# Patient Record
Sex: Male | Born: 1950
Health system: Southern US, Community
[De-identification: ages and names within clinical notes are randomized; demographics above are authoritative.]

## PROBLEM LIST (undated history)

## (undated) DIAGNOSIS — M1990 Unspecified osteoarthritis, unspecified site: Secondary | ICD-10-CM

## (undated) DIAGNOSIS — I10 Essential (primary) hypertension: Secondary | ICD-10-CM

## (undated) DIAGNOSIS — C801 Malignant (primary) neoplasm, unspecified: Secondary | ICD-10-CM

## (undated) DIAGNOSIS — Z9009 Acquired absence of other part of head and neck: Secondary | ICD-10-CM

## (undated) DIAGNOSIS — E785 Hyperlipidemia, unspecified: Secondary | ICD-10-CM

## (undated) DIAGNOSIS — G4730 Sleep apnea, unspecified: Secondary | ICD-10-CM

## (undated) DIAGNOSIS — E079 Disorder of thyroid, unspecified: Secondary | ICD-10-CM

## (undated) DIAGNOSIS — E890 Postprocedural hypothyroidism: Secondary | ICD-10-CM

## (undated) DIAGNOSIS — Z923 Personal history of irradiation: Secondary | ICD-10-CM

## (undated) DIAGNOSIS — E039 Hypothyroidism, unspecified: Secondary | ICD-10-CM

## (undated) HISTORY — DX: Hyperlipidemia, unspecified: E78.5

## (undated) HISTORY — DX: Disorder of thyroid, unspecified: E07.9

## (undated) HISTORY — PX: THYROIDECTOMY, PARTIAL: SHX18

---

## 2000-05-07 ENCOUNTER — Emergency Department (HOSPITAL_COMMUNITY): Admission: EM | Admit: 2000-05-07 | Discharge: 2000-05-07 | Payer: Self-pay | Admitting: Emergency Medicine

## 2000-05-07 ENCOUNTER — Encounter: Payer: Self-pay | Admitting: Emergency Medicine

## 2002-03-13 ENCOUNTER — Emergency Department (HOSPITAL_COMMUNITY): Admission: EM | Admit: 2002-03-13 | Discharge: 2002-03-13 | Payer: Self-pay | Admitting: Emergency Medicine

## 2004-09-24 ENCOUNTER — Ambulatory Visit (HOSPITAL_COMMUNITY): Admission: RE | Admit: 2004-09-24 | Discharge: 2004-09-24 | Payer: Self-pay

## 2005-05-25 ENCOUNTER — Encounter: Admission: RE | Admit: 2005-05-25 | Discharge: 2005-05-25 | Payer: Self-pay

## 2011-12-03 ENCOUNTER — Other Ambulatory Visit: Payer: Self-pay | Admitting: Urology

## 2011-12-31 ENCOUNTER — Encounter (HOSPITAL_BASED_OUTPATIENT_CLINIC_OR_DEPARTMENT_OTHER): Payer: Self-pay | Admitting: *Deleted

## 2011-12-31 NOTE — Progress Notes (Signed)
NPO AFTER MN. ARRIVES AT 1015. NEEDS ISTAT AND EKG.

## 2012-01-04 ENCOUNTER — Encounter (HOSPITAL_BASED_OUTPATIENT_CLINIC_OR_DEPARTMENT_OTHER)
Admission: RE | Disposition: A | Discharge status: Home / Self Care | Payer: Self-pay | Source: Ambulatory Visit | Attending: Urology

## 2012-01-04 ENCOUNTER — Encounter (HOSPITAL_BASED_OUTPATIENT_CLINIC_OR_DEPARTMENT_OTHER): Payer: Self-pay | Admitting: Anesthesiology

## 2012-01-04 ENCOUNTER — Ambulatory Visit (HOSPITAL_BASED_OUTPATIENT_CLINIC_OR_DEPARTMENT_OTHER)
Admission: RE | Admit: 2012-01-04 | Discharge: 2012-01-04 | Disposition: A | Discharge status: Home / Self Care | Payer: Self-pay | Source: Ambulatory Visit | Attending: Urology | Admitting: Urology

## 2012-01-04 ENCOUNTER — Ambulatory Visit (HOSPITAL_BASED_OUTPATIENT_CLINIC_OR_DEPARTMENT_OTHER): Payer: Self-pay | Admitting: Anesthesiology

## 2012-01-04 ENCOUNTER — Encounter (HOSPITAL_BASED_OUTPATIENT_CLINIC_OR_DEPARTMENT_OTHER): Payer: Self-pay | Admitting: *Deleted

## 2012-01-04 DIAGNOSIS — N471 Phimosis: Secondary | ICD-10-CM | POA: Insufficient documentation

## 2012-01-04 DIAGNOSIS — E119 Type 2 diabetes mellitus without complications: Secondary | ICD-10-CM | POA: Insufficient documentation

## 2012-01-04 DIAGNOSIS — I10 Essential (primary) hypertension: Secondary | ICD-10-CM | POA: Insufficient documentation

## 2012-01-04 DIAGNOSIS — N478 Other disorders of prepuce: Secondary | ICD-10-CM | POA: Insufficient documentation

## 2012-01-04 DIAGNOSIS — Z79899 Other long term (current) drug therapy: Secondary | ICD-10-CM | POA: Insufficient documentation

## 2012-01-04 DIAGNOSIS — Z7982 Long term (current) use of aspirin: Secondary | ICD-10-CM | POA: Insufficient documentation

## 2012-01-04 HISTORY — DX: Unspecified osteoarthritis, unspecified site: M19.90

## 2012-01-04 HISTORY — PX: CIRCUMCISION: SHX1350

## 2012-01-04 HISTORY — DX: Acquired absence of other part of head and neck: Z90.09

## 2012-01-04 HISTORY — DX: Postprocedural hypothyroidism: E89.0

## 2012-01-04 HISTORY — DX: Essential (primary) hypertension: I10

## 2012-01-04 LAB — POCT I-STAT 4, (NA,K, GLUC, HGB,HCT)
Glucose, Bld: 114 mg/dL — ABNORMAL HIGH (ref 70–99)
HCT: 43 % (ref 39.0–52.0)

## 2012-01-04 SURGERY — CIRCUMCISION, ADULT
Anesthesia: General | Site: Penis | Wound class: Clean

## 2012-01-04 MED ORDER — MORPHINE SULFATE 2 MG/ML IJ SOLN: 1.0000 mg | INTRAMUSCULAR | Status: DC | PRN

## 2012-01-04 MED ORDER — ACETAMINOPHEN 10 MG/ML IV SOLN
1000.0000 mg | Freq: Four times a day (QID) | INTRAVENOUS | Status: DC
  Administered 2012-01-04: 1000 mg via INTRAVENOUS

## 2012-01-04 MED ORDER — CEFAZOLIN SODIUM-DEXTROSE 2-3 GM-% IV SOLR: 2.0000 g | INTRAVENOUS | Status: DC

## 2012-01-04 MED ORDER — GLYCOPYRROLATE 0.2 MG/ML IJ SOLN
INTRAMUSCULAR | Status: DC | PRN
  Administered 2012-01-04: 0.2 mg via INTRAVENOUS

## 2012-01-04 MED ORDER — BUPIVACAINE HCL 0.25 % IJ SOLN
INTRAMUSCULAR | Status: DC | PRN
  Administered 2012-01-04: 20 mL

## 2012-01-04 MED ORDER — ACETAMINOPHEN 325 MG PO TABS: 650.0000 mg | ORAL_TABLET | ORAL | Status: DC | PRN

## 2012-01-04 MED ORDER — MIDAZOLAM HCL 5 MG/5ML IJ SOLN
INTRAMUSCULAR | Status: DC | PRN
  Administered 2012-01-04: 2 mg via INTRAVENOUS

## 2012-01-04 MED ORDER — SODIUM CHLORIDE 0.9 % IV SOLN: 250.0000 mL | INTRAVENOUS | Status: DC | PRN

## 2012-01-04 MED ORDER — ONDANSETRON HCL 4 MG/2ML IJ SOLN: 4.0000 mg | Freq: Four times a day (QID) | INTRAMUSCULAR | Status: DC | PRN

## 2012-01-04 MED ORDER — ONDANSETRON HCL 4 MG/2ML IJ SOLN
INTRAMUSCULAR | Status: DC | PRN
  Administered 2012-01-04: 4 mg via INTRAVENOUS

## 2012-01-04 MED ORDER — LIDOCAINE HCL (CARDIAC) 20 MG/ML IV SOLN
INTRAVENOUS | Status: DC | PRN
  Administered 2012-01-04: 100 mg via INTRAVENOUS

## 2012-01-04 MED ORDER — PROPOFOL 10 MG/ML IV BOLUS
INTRAVENOUS | Status: DC | PRN
  Administered 2012-01-04: 300 mg via INTRAVENOUS

## 2012-01-04 MED ORDER — FENTANYL CITRATE 0.05 MG/ML IJ SOLN: 25.0000 ug | INTRAMUSCULAR | Status: DC | PRN

## 2012-01-04 MED ORDER — LACTATED RINGERS IV SOLN
INTRAVENOUS | Status: DC
  Administered 2012-01-04: 100 mL/h via INTRAVENOUS

## 2012-01-04 MED ORDER — LACTATED RINGERS IV SOLN
INTRAVENOUS | Status: DC | PRN
  Administered 2012-01-04 (×2): via INTRAVENOUS

## 2012-01-04 MED ORDER — OXYCODONE HCL 5 MG PO TABS: 5.0000 mg | ORAL_TABLET | ORAL | Status: DC | PRN

## 2012-01-04 MED ORDER — DEXTROSE 5 % IV SOLN
3.0000 g | INTRAVENOUS | Status: DC | PRN
  Administered 2012-01-04: 3 g via INTRAVENOUS

## 2012-01-04 MED ORDER — DEXAMETHASONE SODIUM PHOSPHATE 4 MG/ML IJ SOLN
INTRAMUSCULAR | Status: DC | PRN
  Administered 2012-01-04: 10 mg via INTRAVENOUS

## 2012-01-04 MED ORDER — EPHEDRINE SULFATE 50 MG/ML IJ SOLN
INTRAMUSCULAR | Status: DC | PRN
  Administered 2012-01-04: 10 mg via INTRAVENOUS

## 2012-01-04 MED ORDER — SODIUM CHLORIDE 0.9 % IJ SOLN: 3.0000 mL | INTRAMUSCULAR | Status: DC | PRN

## 2012-01-04 MED ORDER — SODIUM CHLORIDE 0.9 % IJ SOLN: 3.0000 mL | Freq: Two times a day (BID) | INTRAMUSCULAR | Status: DC

## 2012-01-04 MED ORDER — CEPHALEXIN 500 MG PO CAPS: 500.0000 mg | ORAL_CAPSULE | Freq: Two times a day (BID) | ORAL | Status: DC

## 2012-01-04 MED ORDER — DEXTROSE 5 % IV SOLN: 3.0000 g | INTRAVENOUS | Status: DC

## 2012-01-04 MED ORDER — FENTANYL CITRATE 0.05 MG/ML IJ SOLN
INTRAMUSCULAR | Status: DC | PRN
  Administered 2012-01-04: 100 ug via INTRAVENOUS
  Administered 2012-01-04 (×2): 50 ug via INTRAVENOUS

## 2012-01-04 MED ORDER — PROMETHAZINE HCL 25 MG/ML IJ SOLN: 6.2500 mg | INTRAMUSCULAR | Status: DC | PRN

## 2012-01-04 MED ORDER — KETOROLAC TROMETHAMINE 30 MG/ML IJ SOLN: 15.0000 mg | Freq: Once | INTRAMUSCULAR | Status: DC | PRN

## 2012-01-04 MED ORDER — KETOROLAC TROMETHAMINE 30 MG/ML IJ SOLN
INTRAMUSCULAR | Status: DC | PRN
  Administered 2012-01-04: 30 mg via INTRAVENOUS

## 2012-01-04 MED ORDER — ACETAMINOPHEN 650 MG RE SUPP: 650.0000 mg | RECTAL | Status: DC | PRN

## 2012-01-04 MED ORDER — HYDROCODONE-ACETAMINOPHEN 7.5-650 MG PO TABS: 1.0000 | ORAL_TABLET | Freq: Four times a day (QID) | ORAL | Status: DC | PRN

## 2012-01-04 MED ORDER — SODIUM CHLORIDE 0.45 % IV SOLN: INTRAVENOUS | Status: DC

## 2012-01-04 SURGICAL SUPPLY — 33 items
ADH SKN CLS APL DERMABOND .7 (GAUZE/BANDAGES/DRESSINGS) ×1
BANDAGE CO FLEX L/F 2IN X 5YD (GAUZE/BANDAGES/DRESSINGS) ×2 IMPLANT
BLADE SURG 15 STRL LF DISP TIS (BLADE) ×1 IMPLANT
BLADE SURG 15 STRL SS (BLADE) ×2
BLADE SURG ROTATE 9660 (MISCELLANEOUS) ×1 IMPLANT
CLOTH BEACON ORANGE TIMEOUT ST (SAFETY) ×2 IMPLANT
COVER MAYO STAND STRL (DRAPES) ×2 IMPLANT
COVER TABLE BACK 60X90 (DRAPES) ×2 IMPLANT
DERMABOND ADVANCED (GAUZE/BANDAGES/DRESSINGS) ×1
DERMABOND ADVANCED .7 DNX12 (GAUZE/BANDAGES/DRESSINGS) ×1 IMPLANT
DRAPE PED LAPAROTOMY (DRAPES) ×2 IMPLANT
ELECT NDL TIP 2.8 STRL (NEEDLE) ×1 IMPLANT
ELECT NEEDLE TIP 2.8 STRL (NEEDLE) ×2 IMPLANT
ELECT REM PT RETURN 9FT ADLT (ELECTROSURGICAL) ×2
ELECTRODE REM PT RTRN 9FT ADLT (ELECTROSURGICAL) ×1 IMPLANT
GLOVE BIO SURGEON STRL SZ7.5 (GLOVE) ×3 IMPLANT
GLOVE BIOGEL PI IND STRL 6.5 (GLOVE) IMPLANT
GLOVE BIOGEL PI IND STRL 7.0 (GLOVE) IMPLANT
GLOVE BIOGEL PI INDICATOR 6.5 (GLOVE) ×1
GLOVE BIOGEL PI INDICATOR 7.0 (GLOVE) ×1
GOWN PREVENTION PLUS LG XLONG (DISPOSABLE) ×2 IMPLANT
GOWN STRL REIN XL XLG (GOWN DISPOSABLE) ×2 IMPLANT
NDL HYPO 25X5/8 SAFETYGLIDE (NEEDLE) ×1 IMPLANT
NEEDLE HYPO 25X5/8 SAFETYGLIDE (NEEDLE) ×2 IMPLANT
NS IRRIG 500ML POUR BTL (IV SOLUTION) ×2 IMPLANT
PACK BASIN DAY SURGERY FS (CUSTOM PROCEDURE TRAY) ×2 IMPLANT
PENCIL BUTTON HOLSTER BLD 10FT (ELECTRODE) ×2 IMPLANT
SUT MON AB 4-0 PC3 18 (SUTURE) ×5 IMPLANT
SYR CONTROL 10ML LL (SYRINGE) ×2 IMPLANT
TOWEL NATURAL 6PK STERILE (DISPOSABLE) ×2 IMPLANT
TOWEL OR 17X24 6PK STRL BLUE (TOWEL DISPOSABLE) ×4 IMPLANT
TRAY DSU PREP LF (CUSTOM PROCEDURE TRAY) ×2 IMPLANT
WATER STERILE IRR 500ML POUR (IV SOLUTION) ×2 IMPLANT

## 2012-01-04 NOTE — Anesthesia Procedure Notes (Signed)
Procedure Name: LMA Insertion Date/Time: 01/04/2012 12:06 PM Performed by: Jessica Priest Pre-anesthesia Checklist: Patient identified, Emergency Drugs available, Suction available and Patient being monitored Patient Re-evaluated:Patient Re-evaluated prior to inductionOxygen Delivery Method: Circle System Utilized Preoxygenation: Pre-oxygenation with 100% oxygen Intubation Type: IV induction Ventilation: Mask ventilation without difficulty LMA: LMA inserted LMA Size: 5.0 Number of attempts: 1 Airway Equipment and Method: bite block Placement Confirmation: positive ETCO2 Tube secured with: Tape Dental Injury: Teeth and Oropharynx as per pre-operative assessment

## 2012-01-04 NOTE — Transfer of Care (Signed)
Immediate Anesthesia Transfer of Care Note  Patient: Isaac Carpenter  Procedure(s) Performed: Procedure(s) (LRB): CIRCUMCISION ADULT (N/A)  Patient Location: PACU  Anesthesia Type: General  Level of Consciousness: awake, sedated, patient cooperative and responds to stimulation  Airway & Oxygen Therapy: Patient Spontanous Breathing and Patient connected to face mask oxygen  Post-op Assessment: Report given to PACU RN, Post -op Vital signs reviewed and stable and Patient moving all extremities  Post vital signs: Reviewed and stable  Complications: No apparent anesthesia complications

## 2012-01-04 NOTE — H&P (Signed)
Chief Complaint  cc: Dr. Merri Brunette   Reason For Visit  Painful erections   Active Problems Problems  1. Diabetes Mellitus 250.00 2. Phimosis 605  History of Present Illness          61 yo male, 6'1" 295lb,  referred by Dr. Merri Brunette for further evaluation of painful erections x 5-6 months.  He is uncircumcised & states that the foreskin gets tight with erections causing pain.  He also has some urgency, frequency & nocturia x 1.  Today's IPSS = 4/7. He denies diabetes. Note hx of abnormal blood sugar per Dr.Smith's notes.   Past Medical History Problems  1. History of  Hypertension 401.9  Surgical History Problems  1. History of  Thyroid Surgery  Current Meds 1. Aspirin 81 MG Oral Tablet; Therapy: (Recorded:15Aug2013) to 2. Hydrochlorothiazide 25 MG Oral Tablet; Therapy: (Recorded:15Aug2013) to  Allergies Medication  1. AndroGel Pump GEL  Family History Problems  1. Maternal history of  Breast Cancer V16.3 2. Family history of  Brother Deceased At Age ____ liver disease 3. Fraternal history of  Chronic Liver Disease 4. Paternal history of  Emphysema 5. Family history of  Family Health Status Children ___ Living Sons 1 6. Family history of  Father Deceased At Age ____ emphysema, heart disease 7. Paternal history of  Heart Disease V17.49 8. Sororal history of  Leukemia V16.6 9. Family history of  Mother Deceased At Age ____ breast cancer 10. Family history of  Sister Deceased At Age ____ leukemia  Social History Problems    Caffeine Use 2   Former Smoker V15.82 smoked a couple of cigarettes back in high school, no more than that   Marital History - Single   Occupation: Engineer, maintenance (IT)    History of  Alcohol Use  Review of Systems Genitourinary, constitutional, skin, eye, otolaryngeal, hematologic/lymphatic, cardiovascular, pulmonary, endocrine, musculoskeletal, gastrointestinal, neurological and psychiatric system(s) were reviewed and  pertinent findings if present are noted.  Genitourinary: urinary frequency, feelings of urinary urgency and nocturia and painful erections.    Vitals Vital Signs [Data Includes: Last 1 Day]  15Aug2013 09:08AM  BMI Calculated: 39.1 BSA Calculated: 2.53 Height: 6 ft 1 in Weight: 295 lb  Blood Pressure: 166 / 82 Temperature: 97.6 F Heart Rate: 47  Physical Exam Abdomen: The abdomen is obese. The abdomen is soft and nontender. No masses are palpated. No CVA tenderness. No hernias are palpable. No hepatosplenomegaly noted.  Genitourinary: Examination of the penis demonstrates tenderness on palpation, an adherent prepuce and phimosis, but no paraphimosis, no balanitis, no priapism, no lesions and a normal meatus. The penis is uncircumcised. The scrotum is normal in appearance.    Assessment Assessed  1. Diabetes Mellitus 250.00 2. Phimosis 605   [name] is an obese 61 yo male wiht severe phimosis-typical of a diabetic ( which he denies). He will leave a u/a today to ck for glucose. Not per Dr. Michaelle Copas notes that he ha a  hx of abnormal glucose. Will need circumcision, so will ck glucose and A1c.   Plan Diabetes Mellitus (250.00)  1. HGB A1C  Done: 15Aug2013      Schedule circumcision.  Ck u/a today. Hg A1c today.   Signatures Electronically signed by : Jethro Bolus, M.D.; Dec 03 2011  4:06PM

## 2012-01-04 NOTE — Op Note (Signed)
Pre-operative diagnosis : Diabetic Phimosis  Postoperative diagnosis: Same  Operation: Circumcision  Surgeon:  S. Patsi Sears, MD  First assistant: None  Anesthesia:  general  Preparation: After appropriate preanesthesia, the patient was brought to the operating room, placed on the operating table in the dorsal is supine position, where the penis was prepped with Betadine solution, and draped in usual fashion. The armband was double checked. IV antibiotic was given. IV Tylenol was given.  Review history:History of Present Illness  60 yo male, 6'1" 295lb, referred by Dr. Merri Brunette for further evaluation of painful erections x 5-6 months. He is uncircumcised & states that the foreskin gets tight with erections causing pain. He also has some urgency, frequency & nocturia x 1. Today's IPSS = 4/7. He denies diabetes. Note hx of abnormal blood sugar per Dr.Smith's notes.      Statement of  Likelihood of Success: Excellent. TIME-OUT observed.:  Procedure: Inspection the penis revealed that the patient had extensive phimosis. The white discolored foreskin was thickened, and could not be delivered over the glans. The foreskin had cracked in multiple areas, and scarred on previous occasions. A blue marking pen was used to mark the pericarinal surface. A straight clamp was used to clamp the 12:00 skin, and incision was made. Marcaine 25 plain was then injected at the base the penis, circumferentially, to give a penile block. The foreskin was then brought proximalward, across the glans, and the penile skin cleansed of a large quantity of whitish dry mucous and debris. Following this, a blue marking pen was used to outline a periglandular incision. Pericarinal and periglandular incisions were made. The foreskin was removed and discarded. Hemostasis was achieved with the electrosurgical unit.  4 separate quadrants were then created with 4-0 Monocryl suture. Each quadrant was then closed with interrupted  4-0 Monocryl sutures. Following this, sterile dressing was created using Dermabond. The patient was awakened, taken to recovery room in good condition.

## 2012-01-04 NOTE — Anesthesia Preprocedure Evaluation (Signed)
Anesthesia Evaluation  Patient identified by MRN, date of birth, ID band Patient awake    Reviewed: Allergy & Precautions, H&P , NPO status , Patient's Chart, lab work & pertinent test results  Airway Mallampati: II TM Distance: <3 FB Neck ROM: Full    Dental No notable dental hx.    Pulmonary neg pulmonary ROS,    + decreased breath sounds      Cardiovascular hypertension, Pt. on medications Rhythm:Regular Rate:Normal     Neuro/Psych negative neurological ROS  negative psych ROS   GI/Hepatic negative GI ROS, Neg liver ROS,   Endo/Other  Morbid obesity  Renal/GU negative Renal ROS  negative genitourinary   Musculoskeletal negative musculoskeletal ROS (+)   Abdominal   Peds negative pediatric ROS (+)  Hematology negative hematology ROS (+)   Anesthesia Other Findings   Reproductive/Obstetrics negative OB ROS                           Anesthesia Physical Anesthesia Plan  ASA: III  Anesthesia Plan: General   Post-op Pain Management:    Induction: Intravenous  Airway Management Planned: LMA  Additional Equipment:   Intra-op Plan:   Post-operative Plan:   Informed Consent: I have reviewed the patients History and Physical, chart, labs and discussed the procedure including the risks, benefits and alternatives for the proposed anesthesia with the patient or authorized representative who has indicated his/her understanding and acceptance.   Dental advisory given  Plan Discussed with: CRNA and Surgeon  Anesthesia Plan Comments:         Anesthesia Quick Evaluation

## 2012-01-04 NOTE — Interval H&P Note (Signed)
History and Physical Interval Note:  01/04/2012 12:00 PM  Isaac Carpenter  has presented today for surgery, with the diagnosis of phimosis  The various methods of treatment have been discussed with the patient and family. After consideration of risks, benefits and other options for treatment, the patient has consented to  Procedure(s) (LRB) with comments: CIRCUMCISION ADULT (N/A) as a surgical intervention .  The patient's history has been reviewed, patient examined, no change in status, stable for surgery.  I have reviewed the patient's chart and labs.  Questions were answered to the patient's satisfaction.     Jethro Bolus I

## 2012-01-04 NOTE — Anesthesia Postprocedure Evaluation (Signed)
  Anesthesia Post-op Note  Patient: Isaac Carpenter  Procedure(s) Performed: Procedure(s) (LRB): CIRCUMCISION ADULT (N/A)  Patient Location: PACU  Anesthesia Type: General  Level of Consciousness: awake and alert   Airway and Oxygen Therapy: Patient Spontanous Breathing  Post-op Pain: mild  Post-op Assessment: Post-op Vital signs reviewed, Patient's Cardiovascular Status Stable, Respiratory Function Stable, Patent Airway and No signs of Nausea or vomiting  Post-op Vital Signs: stable  Complications: No apparent anesthesia complications

## 2012-01-05 ENCOUNTER — Encounter (HOSPITAL_BASED_OUTPATIENT_CLINIC_OR_DEPARTMENT_OTHER): Payer: Self-pay | Admitting: Urology

## 2012-03-04 ENCOUNTER — Emergency Department (HOSPITAL_COMMUNITY)
Admission: EM | Admit: 2012-03-04 | Discharge: 2012-03-04 | Disposition: A | Discharge status: Home / Self Care | Payer: Self-pay | Attending: Emergency Medicine | Admitting: Emergency Medicine

## 2012-03-04 ENCOUNTER — Encounter (HOSPITAL_COMMUNITY): Payer: Self-pay | Admitting: Emergency Medicine

## 2012-03-04 ENCOUNTER — Emergency Department (HOSPITAL_COMMUNITY): Payer: Self-pay

## 2012-03-04 DIAGNOSIS — G459 Transient cerebral ischemic attack, unspecified: Secondary | ICD-10-CM | POA: Insufficient documentation

## 2012-03-04 DIAGNOSIS — Z79899 Other long term (current) drug therapy: Secondary | ICD-10-CM | POA: Insufficient documentation

## 2012-03-04 DIAGNOSIS — M119 Crystal arthropathy, unspecified: Secondary | ICD-10-CM | POA: Insufficient documentation

## 2012-03-04 DIAGNOSIS — I10 Essential (primary) hypertension: Secondary | ICD-10-CM | POA: Insufficient documentation

## 2012-03-04 DIAGNOSIS — Z8639 Personal history of other endocrine, nutritional and metabolic disease: Secondary | ICD-10-CM | POA: Insufficient documentation

## 2012-03-04 DIAGNOSIS — Z862 Personal history of diseases of the blood and blood-forming organs and certain disorders involving the immune mechanism: Secondary | ICD-10-CM | POA: Insufficient documentation

## 2012-03-04 DIAGNOSIS — R410 Disorientation, unspecified: Secondary | ICD-10-CM

## 2012-03-04 DIAGNOSIS — I6350 Cerebral infarction due to unspecified occlusion or stenosis of unspecified cerebral artery: Secondary | ICD-10-CM | POA: Insufficient documentation

## 2012-03-04 DIAGNOSIS — F29 Unspecified psychosis not due to a substance or known physiological condition: Secondary | ICD-10-CM

## 2012-03-04 LAB — GLUCOSE, CAPILLARY: Glucose-Capillary: 134 mg/dL — ABNORMAL HIGH (ref 70–99)

## 2012-03-04 LAB — CBC
HCT: 42.4 % (ref 39.0–52.0)
Hemoglobin: 14.5 g/dL (ref 13.0–17.0)
MCH: 30.7 pg (ref 26.0–34.0)
MCHC: 34.2 g/dL (ref 30.0–36.0)
MCV: 89.8 fL (ref 78.0–100.0)
Platelets: 202 10*3/uL (ref 150–400)
RBC: 4.72 MIL/uL (ref 4.22–5.81)
RDW: 13.9 % (ref 11.5–15.5)
WBC: 6.1 K/uL (ref 4.0–10.5)

## 2012-03-04 LAB — DIFFERENTIAL
Basophils Absolute: 0 10*3/uL (ref 0.0–0.1)
Basophils Relative: 0 % (ref 0–1)
Eosinophils Absolute: 0.2 10*3/uL (ref 0.0–0.7)
Eosinophils Relative: 3 % (ref 0–5)
Lymphocytes Relative: 39 % (ref 12–46)
Lymphs Abs: 2.4 K/uL (ref 0.7–4.0)
Monocytes Absolute: 0.6 K/uL (ref 0.1–1.0)
Monocytes Relative: 10 % (ref 3–12)
Neutro Abs: 2.9 10*3/uL (ref 1.7–7.7)
Neutrophils Relative %: 48 % (ref 43–77)

## 2012-03-04 LAB — TROPONIN I: Troponin I: <0.3 ng/mL (ref <0.30)

## 2012-03-04 LAB — APTT: aPTT: 29 seconds (ref 24–37)

## 2012-03-04 LAB — POCT I-STAT, CHEM 8
BUN: 25 mg/dL — ABNORMAL HIGH (ref 6–23)
Calcium, Ion: 1.14 mmol/L (ref 1.13–1.30)
Chloride: 102 mEq/L (ref 96–112)
Creatinine, Ser: 1 mg/dL (ref 0.50–1.35)
Glucose, Bld: 122 mg/dL — ABNORMAL HIGH (ref 70–99)
HCT: 43 % (ref 39.0–52.0)
Hemoglobin: 14.6 g/dL (ref 13.0–17.0)
Potassium: 3.4 mEq/L — ABNORMAL LOW (ref 3.5–5.1)
Sodium: 141 mEq/L (ref 135–145)
TCO2: 28 mmol/L (ref 0–100)

## 2012-03-04 LAB — COMPREHENSIVE METABOLIC PANEL WITH GFR
BUN: 23 mg/dL (ref 6–23)
CO2: 28 meq/L (ref 19–32)
Calcium: 9.1 mg/dL (ref 8.4–10.5)
Creatinine, Ser: 0.87 mg/dL (ref 0.50–1.35)
GFR calc Af Amer: >90 mL/min (ref >90)
GFR calc non Af Amer: >90 mL/min (ref >90)
Glucose, Bld: 123 mg/dL — ABNORMAL HIGH (ref 70–99)

## 2012-03-04 LAB — COMPREHENSIVE METABOLIC PANEL
ALT: 32 U/L (ref 0–53)
AST: 34 U/L (ref 0–37)
Albumin: 3.8 g/dL (ref 3.5–5.2)
Alkaline Phosphatase: 46 U/L (ref 39–117)
Chloride: 101 mEq/L (ref 96–112)
Potassium: 3.6 mEq/L (ref 3.5–5.1)
Sodium: 140 mEq/L (ref 135–145)
Total Bilirubin: 0.4 mg/dL (ref 0.3–1.2)
Total Protein: 6.7 g/dL (ref 6.0–8.3)

## 2012-03-04 LAB — PROTIME-INR
INR: 1.01 (ref 0.00–1.49)
Prothrombin Time: 13.2 seconds (ref 11.6–15.2)

## 2012-03-04 NOTE — ED Notes (Signed)
Wife stated he had a sudden onset of confusion around 8:30 pm..  Wife gave wo baby aspirin around 9 pm.  Wife denies seeing facial droop/weakness/slurred speech.  Wife took BP at home (158/73) took again (123/73); pt. Unsure why he is here; oriented to person and place.

## 2012-03-04 NOTE — ED Provider Notes (Signed)
History     CSN: [id number]  Arrival date & time 03/04/12  2153   First MD Initiated Contact with Patient 03/04/12 2221      Chief Complaint  Patient presents with  . Code Stroke    (Consider location/radiation/quality/duration/timing/severity/associated sxs/prior treatment) HPI Comments: Pt and spouse had eaten dinner together, came home,m she was upstairs and he down, was talking to a friend.  Pt had altered mentation during telephone conversation, was speaking non-sensically per spouse.  Friend told patient to call for his wife which she did.  She found he wasn't speaking right, memory was poor.  Pt brought here, code stroke called from triage.  Pt denies HA, reports feeling fine.  Pt has amnesia to recent events, doesn't recall calling for wife, didn't recall going to eat dinner.  He recalls skipping lunch and has good memory of eating breakfast, confirmed by spouse.  No prior h/o TIA, CVA, CAD.  Pt has known HTN, takes meds.  No cholesterol, DM.  Brother had cardiac history, no immediate family with CVA's or TIA.    The history is provided by the patient and the spouse.    Past Medical History  Diagnosis Date  . Hypertension   . History of partial thyroidectomy STATES OVERACTIVE THYROID-- NO ISSUES SINCE AGE 61 AND NO MEDS  . Arthritis     Past Surgical History  Procedure Date  . Thyroidectomy, partial AGE 61    OVERACTIVE THYROID  . Circumcision 01/04/2012    Procedure: CIRCUMCISION ADULT;  Surgeon: Kathi Ludwig, MD;  Location: [facility];  Service: Urology;  Laterality: N/A;    History reviewed. No pertinent family history.  History  Substance Use Topics  . Smoking status: Never Smoker   . Smokeless tobacco: Current User    Types: Chew     Comment: CHEWED FOR 10 YRS  . Alcohol Use: Not on file      Review of Systems  Constitutional: Negative.   Cardiovascular: Negative for chest pain.  Gastrointestinal: Negative for nausea and  abdominal pain.  Neurological: Positive for speech difficulty. Negative for syncope, weakness and headaches.  All other systems reviewed and are negative.    Allergies  Review of patient's allergies indicates no known allergies.  Home Medications   Current Outpatient Rx  Name  Route  Sig  Dispense  Refill  . ASPIRIN EC 81 MG PO TBEC   Oral   Take 81 mg by mouth daily.         Marland Kitchen HYDROCHLOROTHIAZIDE 25 MG PO TABS   Oral   Take 25 mg by mouth daily.           BP 134/65  Pulse 57  Temp 98.4 F (36.9 C) (Oral)  Resp 18  SpO2 98%  Physical Exam  Nursing note and vitals reviewed. Constitutional: He is oriented to person, place, and time. He appears well-developed and well-nourished.  HENT:  Head: Normocephalic and atraumatic.  Eyes: EOM are normal. Pupils are equal, round, and reactive to light.  Neck: Normal range of motion. Neck supple.  Cardiovascular: Normal rate and regular rhythm.   No murmur heard. Pulmonary/Chest: Effort normal. No respiratory distress.  Abdominal: Soft. He exhibits no distension. There is no tenderness.  Musculoskeletal: Normal range of motion. He exhibits no edema.  Neurological: He is alert and oriented to person, place, and time. No cranial nerve deficit. Coordination normal.  Skin: Skin is warm and dry. No rash noted.  Psychiatric: He has a  normal mood and affect.    ED Course  Procedures (including critical care time)  Labs Reviewed  GLUCOSE, CAPILLARY - Abnormal; Notable for the following:    Glucose-Capillary 134 (*)     All other components within normal limits  POCT I-STAT, CHEM 8 - Abnormal; Notable for the following:    Potassium 3.4 (*)     BUN 25 (*)     Glucose, Bld 122 (*)     All other components within normal limits  PROTIME-INR  APTT  CBC  DIFFERENTIAL  COMPREHENSIVE METABOLIC PANEL  TROPONIN I  URINE RAPID DRUG SCREEN (HOSP PERFORMED)   Ct Head Wo Contrast  03/04/2012  *RADIOLOGY REPORT*  Clinical Data:  Slurred speech with confusion.  Code stroke.  CT HEAD WITHOUT CONTRAST  Technique:  Contiguous axial images were obtained from the base of the skull through the vertex without contrast.  Comparison: None.  Findings: There is no evidence for acute infarction, intracranial hemorrhage, mass lesion, hydrocephalus, or extra-axial fluid.  Mild atrophy.  No significant white matter disease.  Calvarium intact. Clear sinuses and mastoids.  IMPRESSION: Atrophy.  No acute intracranial findings.  Critical Value/emergent results were called by telephone at the time of interpretation on 03/04/2012 at 10:21 p.m. to Dr. Oletta Lamas, who verbally acknowledged these results.   Original Report Authenticated By: Davonna Belling, M.D.      1. Transient confusion     ECG at time 22:22 shows NSR at rate 58, borderline poor r wave progression, normal axis, no ST or T wave abn's.  I interpret to be borderline ECG.    RA sat is 98% and I interpret to be normal.     11:05 PM Discussed with neurologist.  Disagrees, doesn't think TIA since only memory seems to have been affected.  Could be TGA.  Thinks pt is stable to be discharged to follow up with neurologist as an oupatient.  Will refer to [facility].    MDM  Pt called code CVA.  Pt's faculties seem back to baseline.  Possible TIA.  Neurology to evaluation.  Not a TPA candidate due to seemingly resolution of symptoms.  BP is ok, no fever.          Gavin Pound. Oletta Lamas, MD 03/04/12 2311

## 2012-03-04 NOTE — Code Documentation (Signed)
Patient was at home in his normal state of health with his wife. He was on the phone with his friend when his friend noticed the patient seemed to be confused and directed the patient to get his wife. The wife noticed memory loss and confusion. Patient arrived via private vehicle at 2155. Code stroke was originally paged at 2157 but due to technical error was repaged at 2225. Arrival at 2155 to triage, EDP exam at 2158, stroke team arrived at 2230, [place] at 2030, patient arrived to CT at 2207, phlebotomist arrived at 2219, CT read by EDP at 2210. Initial NIH 0. Code stroke cancelled per Dr. Amada Jupiter

## 2012-03-04 NOTE — ED Notes (Addendum)
Pt oriented to person and place but is confused about why he is here and does not remember things he has done this evening.  Pt did not remember taking chicken off the bone for dogs and had just gotten home from going out to eat and did not remember where he had been tonight.  Symptoms started while on the phone with a friend.  Pt started yelling while on phone and wife went to check on him.  Friend reported to wife that pt was disoriented on the phone.  When wife went to check on pt he was asking what they did today.  Wife reports LSN 10 min prior to phone conversation.  LSN at 8:30pm. No facial droop, weakness, or slurred speech noted on triage exam.  Melissa, Charge RN notified of Code Stroke, NS called [facility] for Code Stroke Activation, and pt straight to bridge for Dr. Oletta Lamas to assess airway and then pt to CT with RN x 2.

## 2012-03-05 NOTE — Consult Note (Signed)
Reason for Consult:Transient memory difficulty Referring Physician: Quita Skye  CC: Memory problems  History is obtained from:Patient, Wife  HPI: Isaac Carpenter is a 61 y.o. male who was talking to his friend earlier, when he began having difficulty keeping up with the conversation. His friend told him to get his wife. His wife states that he was just repeatedly saying "I can't remember" and was unable to remember anything that happened since a little while before his conversation with the friend(such as taking the dogs out). This lasted for approximately 45 minutes and he gradually improved. He subsequently has had a return of the memories that he lost for a breif while. He has returned completely to baseline at this time.    ROS: A 14 point ROS was performed and is negative except as noted in the HPI.  Past Medical History  Diagnosis Date  . Hypertension   . History of partial thyroidectomy STATES OVERACTIVE THYROID-- NO ISSUES SINCE AGE 72 AND NO MEDS  . Arthritis     Family History: No history of similar events  Social History: Tob: denies smokign  Exam: Current vital signs: BP 138/60  Pulse 58  Temp 98.4 F (36.9 C) (Oral)  Resp 18  SpO2 98% Vital signs in last 24 hours: Temp:  [98 F (36.7 C)-98.4 F (36.9 C)] 98.4 F (36.9 C) (11/15 2223) Pulse Rate:  [57-63] 58  (11/15 2245) Resp:  [14-20] 18  (11/15 2315) BP: (134-163)/(55-67) 138/60 mmHg (11/15 2315) SpO2:  [94 %-98 %] 98 % (11/15 2245)  General: in bed, nad CV: RRR Mental Status: Patient is awake, alert, oriented to person, place, month, year, and situation. Immediate and remote memory are intact. Patient is able to give a clear and coherent history. Able to spell world backwards without difficulty Cranial Nerves: II: Visual Fields are full. Pupils are equal, round, and reactive to light.  Discs are difficult to visualize. III,IV, VI: EOMI without ptosis or diploplia.  V: Facial sensation is  symmetric to temperature VII: Facial movement is symmetric.  VIII: hearing is intact to voice X: Uvula elevates symmetrically XI: Shoulder shrug is symmetric. XII: tongue is midline without atrophy or fasciculations.  Motor: Tone is normal. Bulk is normal. 5/5 strength was present in all four extremities.  Sensory: Sensation is symmetric to light touch and temperature in the arms and legs. Deep Tendon Reflexes: 2+ and symmetric in the biceps and patellae.  Plantars: Toes are downgoing bilaterally.  Cerebellar: FNF and HKS are intact bilaterally Gait: Patient has a stable casual gait. Able to tandem, heel, and toe walk.  I have reviewed labs in epic and the results pertinent to this consultation are: BMP unremarkable  I have reviewed the images obtained:CT head no acute findings  Impression: 61 yo M with transient episode of amnesia with subsequent return of his memories. This is most consistent with transient global amnesia. TIA would be very unlikely to cause memory difficulties as were described by the wife. Seizure could be in the differential, but the subsequent return of his memory would argue against seizure and post-ictal state as an etiology for his memory loss. I would not favor treating him as a TIA, but since this is a remote possibility I discussed with teh patient and fmaily doin g a TIA workup as an inpatient. They did not want to pursue that course, which I feel is reasonable. If this were new onset seizure, it would be a single event and I would not recommend  starting AEDs at this time in any case. I feel, therefore, that he is safe to be discharged home and can follow up with neurology.   Recommendations: 1) Would follow up with neurology as an outpatient.  2) If further episodes occur, would consider EEG/MRI.   Ritta Slot, MD [facility] [phone]  If 7pm- 7am, please page neurology on call at [phone].

## 2013-06-07 ENCOUNTER — Other Ambulatory Visit: Payer: Self-pay | Admitting: Family Medicine

## 2013-06-07 ENCOUNTER — Ambulatory Visit
Admission: RE | Admit: 2013-06-07 | Discharge: 2013-06-07 | Disposition: A | Discharge status: Home / Self Care | Payer: PRIVATE HEALTH INSURANCE | Source: Ambulatory Visit | Attending: Family Medicine | Admitting: Family Medicine

## 2013-06-07 DIAGNOSIS — Z006 Encounter for examination for normal comparison and control in clinical research program: Secondary | ICD-10-CM

## 2013-07-31 ENCOUNTER — Other Ambulatory Visit: Payer: Self-pay | Admitting: Orthopaedic Surgery

## 2013-07-31 DIAGNOSIS — M25562 Pain in left knee: Secondary | ICD-10-CM

## 2013-08-09 ENCOUNTER — Other Ambulatory Visit: Payer: Self-pay | Admitting: Orthopaedic Surgery

## 2013-08-09 ENCOUNTER — Ambulatory Visit
Admission: RE | Admit: 2013-08-09 | Discharge: 2013-08-09 | Disposition: A | Discharge status: Home / Self Care | Payer: No Typology Code available for payment source | Source: Ambulatory Visit | Attending: Orthopaedic Surgery | Admitting: Orthopaedic Surgery

## 2013-08-09 DIAGNOSIS — M25562 Pain in left knee: Secondary | ICD-10-CM

## 2013-08-09 DIAGNOSIS — IMO0001 Reserved for inherently not codable concepts without codable children: Secondary | ICD-10-CM

## 2016-11-09 DIAGNOSIS — I10 Essential (primary) hypertension: Secondary | ICD-10-CM | POA: Diagnosis not present

## 2016-11-09 DIAGNOSIS — Z125 Encounter for screening for malignant neoplasm of prostate: Secondary | ICD-10-CM | POA: Diagnosis not present

## 2016-11-09 DIAGNOSIS — E041 Nontoxic single thyroid nodule: Secondary | ICD-10-CM | POA: Diagnosis not present

## 2016-11-09 DIAGNOSIS — Z23 Encounter for immunization: Secondary | ICD-10-CM | POA: Diagnosis not present

## 2016-11-09 DIAGNOSIS — Z1389 Encounter for screening for other disorder: Secondary | ICD-10-CM | POA: Diagnosis not present

## 2016-11-09 DIAGNOSIS — E785 Hyperlipidemia, unspecified: Secondary | ICD-10-CM | POA: Diagnosis not present

## 2016-11-09 DIAGNOSIS — Z1159 Encounter for screening for other viral diseases: Secondary | ICD-10-CM | POA: Diagnosis not present

## 2016-11-09 DIAGNOSIS — R7303 Prediabetes: Secondary | ICD-10-CM | POA: Diagnosis not present

## 2016-11-09 DIAGNOSIS — R001 Bradycardia, unspecified: Secondary | ICD-10-CM | POA: Diagnosis not present

## 2016-11-09 DIAGNOSIS — Z0000 Encounter for general adult medical examination without abnormal findings: Secondary | ICD-10-CM | POA: Diagnosis not present

## 2017-03-24 DIAGNOSIS — R05 Cough: Secondary | ICD-10-CM | POA: Diagnosis not present

## 2017-03-24 DIAGNOSIS — E039 Hypothyroidism, unspecified: Secondary | ICD-10-CM | POA: Diagnosis not present

## 2017-03-24 DIAGNOSIS — R400 Somnolence: Secondary | ICD-10-CM | POA: Diagnosis not present

## 2017-05-03 DIAGNOSIS — R0600 Dyspnea, unspecified: Secondary | ICD-10-CM | POA: Diagnosis not present

## 2017-05-03 DIAGNOSIS — G4700 Insomnia, unspecified: Secondary | ICD-10-CM | POA: Diagnosis not present

## 2017-05-03 DIAGNOSIS — G478 Other sleep disorders: Secondary | ICD-10-CM | POA: Diagnosis not present

## 2017-05-03 DIAGNOSIS — R0683 Snoring: Secondary | ICD-10-CM | POA: Diagnosis not present

## 2017-05-21 DIAGNOSIS — R05 Cough: Secondary | ICD-10-CM | POA: Diagnosis not present

## 2017-06-02 DIAGNOSIS — G4733 Obstructive sleep apnea (adult) (pediatric): Secondary | ICD-10-CM | POA: Diagnosis not present

## 2017-06-03 DIAGNOSIS — G4733 Obstructive sleep apnea (adult) (pediatric): Secondary | ICD-10-CM | POA: Diagnosis not present

## 2017-07-01 DIAGNOSIS — R7303 Prediabetes: Secondary | ICD-10-CM | POA: Diagnosis not present

## 2017-07-01 DIAGNOSIS — E039 Hypothyroidism, unspecified: Secondary | ICD-10-CM | POA: Diagnosis not present

## 2017-07-01 DIAGNOSIS — I10 Essential (primary) hypertension: Secondary | ICD-10-CM | POA: Diagnosis not present

## 2017-08-23 DIAGNOSIS — E039 Hypothyroidism, unspecified: Secondary | ICD-10-CM | POA: Diagnosis not present

## 2017-11-23 DIAGNOSIS — E785 Hyperlipidemia, unspecified: Secondary | ICD-10-CM | POA: Diagnosis not present

## 2017-11-23 DIAGNOSIS — R7303 Prediabetes: Secondary | ICD-10-CM | POA: Diagnosis not present

## 2017-11-23 DIAGNOSIS — Z23 Encounter for immunization: Secondary | ICD-10-CM | POA: Diagnosis not present

## 2017-11-23 DIAGNOSIS — Z0000 Encounter for general adult medical examination without abnormal findings: Secondary | ICD-10-CM | POA: Diagnosis not present

## 2017-11-23 DIAGNOSIS — Z6841 Body Mass Index (BMI) 40.0 and over, adult: Secondary | ICD-10-CM | POA: Diagnosis not present

## 2017-11-23 DIAGNOSIS — I10 Essential (primary) hypertension: Secondary | ICD-10-CM | POA: Diagnosis not present

## 2017-11-23 DIAGNOSIS — Z1389 Encounter for screening for other disorder: Secondary | ICD-10-CM | POA: Diagnosis not present

## 2017-11-23 DIAGNOSIS — R39198 Other difficulties with micturition: Secondary | ICD-10-CM | POA: Diagnosis not present

## 2017-11-23 DIAGNOSIS — Z125 Encounter for screening for malignant neoplasm of prostate: Secondary | ICD-10-CM | POA: Diagnosis not present

## 2017-11-24 ENCOUNTER — Ambulatory Visit
Admission: RE | Admit: 2017-11-24 | Discharge: 2017-11-24 | Disposition: A | Discharge status: Home / Self Care | Payer: Medicare PPO | Source: Ambulatory Visit | Attending: Family Medicine | Admitting: Family Medicine

## 2017-11-24 ENCOUNTER — Other Ambulatory Visit: Payer: Self-pay | Admitting: Family Medicine

## 2017-11-24 DIAGNOSIS — M545 Low back pain: Secondary | ICD-10-CM

## 2017-11-24 DIAGNOSIS — M47816 Spondylosis without myelopathy or radiculopathy, lumbar region: Secondary | ICD-10-CM | POA: Diagnosis not present

## 2017-12-31 DIAGNOSIS — N4883 Acquired buried penis: Secondary | ICD-10-CM | POA: Diagnosis not present

## 2017-12-31 DIAGNOSIS — R3913 Splitting of urinary stream: Secondary | ICD-10-CM | POA: Diagnosis not present

## 2017-12-31 DIAGNOSIS — R3915 Urgency of urination: Secondary | ICD-10-CM | POA: Diagnosis not present

## 2017-12-31 DIAGNOSIS — N401 Enlarged prostate with lower urinary tract symptoms: Secondary | ICD-10-CM | POA: Diagnosis not present

## 2017-12-31 DIAGNOSIS — N481 Balanitis: Secondary | ICD-10-CM | POA: Diagnosis not present

## 2018-02-03 DIAGNOSIS — N481 Balanitis: Secondary | ICD-10-CM | POA: Diagnosis not present

## 2018-02-03 DIAGNOSIS — R351 Nocturia: Secondary | ICD-10-CM | POA: Diagnosis not present

## 2018-02-03 DIAGNOSIS — N401 Enlarged prostate with lower urinary tract symptoms: Secondary | ICD-10-CM | POA: Diagnosis not present

## 2018-02-03 DIAGNOSIS — R3915 Urgency of urination: Secondary | ICD-10-CM | POA: Diagnosis not present

## 2018-02-03 DIAGNOSIS — N4883 Acquired buried penis: Secondary | ICD-10-CM | POA: Diagnosis not present

## 2018-03-15 DIAGNOSIS — Z23 Encounter for immunization: Secondary | ICD-10-CM | POA: Diagnosis not present

## 2018-04-18 DIAGNOSIS — H02135 Senile ectropion of left lower eyelid: Secondary | ICD-10-CM | POA: Diagnosis not present

## 2018-04-18 DIAGNOSIS — H04123 Dry eye syndrome of bilateral lacrimal glands: Secondary | ICD-10-CM | POA: Diagnosis not present

## 2018-04-18 DIAGNOSIS — H4312 Vitreous hemorrhage, left eye: Secondary | ICD-10-CM | POA: Diagnosis not present

## 2018-04-18 DIAGNOSIS — H02132 Senile ectropion of right lower eyelid: Secondary | ICD-10-CM | POA: Diagnosis not present

## 2018-04-18 DIAGNOSIS — H33302 Unspecified retinal break, left eye: Secondary | ICD-10-CM | POA: Diagnosis not present

## 2018-04-19 DIAGNOSIS — H33312 Horseshoe tear of retina without detachment, left eye: Secondary | ICD-10-CM | POA: Diagnosis not present

## 2018-04-19 DIAGNOSIS — Z961 Presence of intraocular lens: Secondary | ICD-10-CM | POA: Diagnosis not present

## 2018-04-19 DIAGNOSIS — H43812 Vitreous degeneration, left eye: Secondary | ICD-10-CM | POA: Diagnosis not present

## 2018-04-19 DIAGNOSIS — H4312 Vitreous hemorrhage, left eye: Secondary | ICD-10-CM | POA: Diagnosis not present

## 2018-05-31 DIAGNOSIS — H33312 Horseshoe tear of retina without detachment, left eye: Secondary | ICD-10-CM | POA: Diagnosis not present

## 2018-05-31 DIAGNOSIS — H4312 Vitreous hemorrhage, left eye: Secondary | ICD-10-CM | POA: Diagnosis not present

## 2018-05-31 DIAGNOSIS — Z09 Encounter for follow-up examination after completed treatment for conditions other than malignant neoplasm: Secondary | ICD-10-CM | POA: Diagnosis not present

## 2018-08-04 ENCOUNTER — Other Ambulatory Visit: Payer: Self-pay

## 2018-08-04 ENCOUNTER — Ambulatory Visit
Admission: RE | Admit: 2018-08-04 | Discharge: 2018-08-04 | Disposition: A | Discharge status: Home / Self Care | Payer: 59 | Source: Ambulatory Visit | Attending: Family Medicine | Admitting: Family Medicine

## 2018-08-04 ENCOUNTER — Other Ambulatory Visit: Payer: Self-pay | Admitting: Family Medicine

## 2018-08-04 DIAGNOSIS — S20212A Contusion of left front wall of thorax, initial encounter: Secondary | ICD-10-CM | POA: Diagnosis not present

## 2018-08-04 DIAGNOSIS — R0781 Pleurodynia: Secondary | ICD-10-CM | POA: Diagnosis not present

## 2018-08-04 DIAGNOSIS — R079 Chest pain, unspecified: Secondary | ICD-10-CM | POA: Diagnosis not present

## 2018-08-05 ENCOUNTER — Other Ambulatory Visit: Payer: Self-pay | Admitting: Family Medicine

## 2018-08-05 DIAGNOSIS — R52 Pain, unspecified: Secondary | ICD-10-CM

## 2018-12-01 DIAGNOSIS — Z1211 Encounter for screening for malignant neoplasm of colon: Secondary | ICD-10-CM | POA: Diagnosis not present

## 2018-12-01 DIAGNOSIS — E291 Testicular hypofunction: Secondary | ICD-10-CM | POA: Diagnosis not present

## 2018-12-01 DIAGNOSIS — E669 Obesity, unspecified: Secondary | ICD-10-CM | POA: Diagnosis not present

## 2018-12-01 DIAGNOSIS — R7303 Prediabetes: Secondary | ICD-10-CM | POA: Diagnosis not present

## 2018-12-01 DIAGNOSIS — E785 Hyperlipidemia, unspecified: Secondary | ICD-10-CM | POA: Diagnosis not present

## 2018-12-01 DIAGNOSIS — Z1389 Encounter for screening for other disorder: Secondary | ICD-10-CM | POA: Diagnosis not present

## 2018-12-01 DIAGNOSIS — Z0000 Encounter for general adult medical examination without abnormal findings: Secondary | ICD-10-CM | POA: Diagnosis not present

## 2018-12-01 DIAGNOSIS — I10 Essential (primary) hypertension: Secondary | ICD-10-CM | POA: Diagnosis not present

## 2018-12-01 DIAGNOSIS — Z125 Encounter for screening for malignant neoplasm of prostate: Secondary | ICD-10-CM | POA: Diagnosis not present

## 2019-01-09 DIAGNOSIS — H9191 Unspecified hearing loss, right ear: Secondary | ICD-10-CM | POA: Diagnosis not present

## 2019-01-09 DIAGNOSIS — H9311 Tinnitus, right ear: Secondary | ICD-10-CM | POA: Diagnosis not present

## 2019-01-09 DIAGNOSIS — H9121 Sudden idiopathic hearing loss, right ear: Secondary | ICD-10-CM | POA: Diagnosis not present

## 2019-01-09 DIAGNOSIS — H903 Sensorineural hearing loss, bilateral: Secondary | ICD-10-CM | POA: Diagnosis not present

## 2019-01-09 DIAGNOSIS — H938X3 Other specified disorders of ear, bilateral: Secondary | ICD-10-CM | POA: Diagnosis not present

## 2019-01-16 ENCOUNTER — Other Ambulatory Visit: Payer: Self-pay | Admitting: Otolaryngology

## 2019-01-16 DIAGNOSIS — H9191 Unspecified hearing loss, right ear: Secondary | ICD-10-CM

## 2019-02-06 ENCOUNTER — Ambulatory Visit
Admission: RE | Admit: 2019-02-06 | Discharge: 2019-02-06 | Disposition: A | Discharge status: Home / Self Care | Payer: Medicare PPO | Source: Ambulatory Visit | Attending: Otolaryngology | Admitting: Otolaryngology

## 2019-02-06 ENCOUNTER — Other Ambulatory Visit: Payer: Self-pay

## 2019-02-06 DIAGNOSIS — H9191 Unspecified hearing loss, right ear: Secondary | ICD-10-CM | POA: Diagnosis not present

## 2019-02-06 MED ORDER — GADOBENATE DIMEGLUMINE 529 MG/ML IV SOLN
20.0000 mL | Freq: Once | INTRAVENOUS | Status: AC | PRN
  Administered 2019-02-06: 20 mL via INTRAVENOUS

## 2019-02-15 DIAGNOSIS — H903 Sensorineural hearing loss, bilateral: Secondary | ICD-10-CM | POA: Diagnosis not present

## 2019-02-15 DIAGNOSIS — H9121 Sudden idiopathic hearing loss, right ear: Secondary | ICD-10-CM | POA: Diagnosis not present

## 2019-02-15 DIAGNOSIS — H9311 Tinnitus, right ear: Secondary | ICD-10-CM | POA: Diagnosis not present

## 2019-03-28 DIAGNOSIS — H903 Sensorineural hearing loss, bilateral: Secondary | ICD-10-CM | POA: Diagnosis not present

## 2019-03-28 DIAGNOSIS — H9121 Sudden idiopathic hearing loss, right ear: Secondary | ICD-10-CM | POA: Diagnosis not present

## 2019-06-05 DIAGNOSIS — G4733 Obstructive sleep apnea (adult) (pediatric): Secondary | ICD-10-CM | POA: Diagnosis not present

## 2019-06-05 DIAGNOSIS — R7303 Prediabetes: Secondary | ICD-10-CM | POA: Diagnosis not present

## 2019-06-05 DIAGNOSIS — E039 Hypothyroidism, unspecified: Secondary | ICD-10-CM | POA: Diagnosis not present

## 2019-06-05 DIAGNOSIS — H905 Unspecified sensorineural hearing loss: Secondary | ICD-10-CM | POA: Diagnosis not present

## 2019-06-05 DIAGNOSIS — I10 Essential (primary) hypertension: Secondary | ICD-10-CM | POA: Diagnosis not present

## 2019-06-05 DIAGNOSIS — E785 Hyperlipidemia, unspecified: Secondary | ICD-10-CM | POA: Diagnosis not present

## 2019-11-07 ENCOUNTER — Encounter (HOSPITAL_COMMUNITY): Payer: Self-pay | Admitting: Emergency Medicine

## 2019-11-07 ENCOUNTER — Emergency Department (HOSPITAL_COMMUNITY)
Admission: EM | Admit: 2019-11-07 | Discharge: 2019-11-07 | Disposition: A | Discharge status: Home / Self Care | Payer: Medicare PPO | Attending: Emergency Medicine | Admitting: Emergency Medicine

## 2019-11-07 DIAGNOSIS — R0902 Hypoxemia: Secondary | ICD-10-CM | POA: Diagnosis not present

## 2019-11-07 DIAGNOSIS — R42 Dizziness and giddiness: Secondary | ICD-10-CM | POA: Diagnosis not present

## 2019-11-07 DIAGNOSIS — Y999 Unspecified external cause status: Secondary | ICD-10-CM | POA: Insufficient documentation

## 2019-11-07 DIAGNOSIS — Y929 Unspecified place or not applicable: Secondary | ICD-10-CM | POA: Diagnosis not present

## 2019-11-07 DIAGNOSIS — X58XXXA Exposure to other specified factors, initial encounter: Secondary | ICD-10-CM | POA: Insufficient documentation

## 2019-11-07 DIAGNOSIS — R58 Hemorrhage, not elsewhere classified: Secondary | ICD-10-CM | POA: Diagnosis not present

## 2019-11-07 DIAGNOSIS — I959 Hypotension, unspecified: Secondary | ICD-10-CM | POA: Diagnosis not present

## 2019-11-07 DIAGNOSIS — Z5321 Procedure and treatment not carried out due to patient leaving prior to being seen by health care provider: Secondary | ICD-10-CM | POA: Diagnosis not present

## 2019-11-07 DIAGNOSIS — Y939 Activity, unspecified: Secondary | ICD-10-CM | POA: Insufficient documentation

## 2019-11-07 DIAGNOSIS — S61011A Laceration without foreign body of right thumb without damage to nail, initial encounter: Secondary | ICD-10-CM | POA: Insufficient documentation

## 2019-11-07 DIAGNOSIS — R4020 Unspecified coma: Secondary | ICD-10-CM | POA: Diagnosis not present

## 2019-11-07 NOTE — ED Triage Notes (Signed)
Per EMS-cut the tip of his right thumb-became hypotensive-given 1500 NS in route-states he feels better

## 2019-11-16 DIAGNOSIS — M545 Low back pain: Secondary | ICD-10-CM | POA: Diagnosis not present

## 2019-12-01 DIAGNOSIS — M9902 Segmental and somatic dysfunction of thoracic region: Secondary | ICD-10-CM | POA: Diagnosis not present

## 2019-12-01 DIAGNOSIS — M9905 Segmental and somatic dysfunction of pelvic region: Secondary | ICD-10-CM | POA: Diagnosis not present

## 2019-12-01 DIAGNOSIS — M9904 Segmental and somatic dysfunction of sacral region: Secondary | ICD-10-CM | POA: Diagnosis not present

## 2019-12-01 DIAGNOSIS — M9903 Segmental and somatic dysfunction of lumbar region: Secondary | ICD-10-CM | POA: Diagnosis not present

## 2019-12-06 DIAGNOSIS — M9902 Segmental and somatic dysfunction of thoracic region: Secondary | ICD-10-CM | POA: Diagnosis not present

## 2019-12-06 DIAGNOSIS — M9905 Segmental and somatic dysfunction of pelvic region: Secondary | ICD-10-CM | POA: Diagnosis not present

## 2019-12-06 DIAGNOSIS — M9904 Segmental and somatic dysfunction of sacral region: Secondary | ICD-10-CM | POA: Diagnosis not present

## 2019-12-06 DIAGNOSIS — M9903 Segmental and somatic dysfunction of lumbar region: Secondary | ICD-10-CM | POA: Diagnosis not present

## 2019-12-07 ENCOUNTER — Ambulatory Visit
Admission: RE | Admit: 2019-12-07 | Discharge: 2019-12-07 | Disposition: A | Discharge status: Home / Self Care | Payer: Medicare PPO | Source: Ambulatory Visit | Attending: Family Medicine | Admitting: Family Medicine

## 2019-12-07 ENCOUNTER — Other Ambulatory Visit: Payer: Self-pay | Admitting: Family Medicine

## 2019-12-07 ENCOUNTER — Other Ambulatory Visit: Payer: Self-pay

## 2019-12-07 DIAGNOSIS — R109 Unspecified abdominal pain: Secondary | ICD-10-CM

## 2019-12-07 DIAGNOSIS — K529 Noninfective gastroenteritis and colitis, unspecified: Secondary | ICD-10-CM | POA: Diagnosis not present

## 2019-12-07 DIAGNOSIS — N200 Calculus of kidney: Secondary | ICD-10-CM | POA: Diagnosis not present

## 2019-12-11 ENCOUNTER — Other Ambulatory Visit: Payer: Self-pay | Admitting: Family Medicine

## 2019-12-13 DIAGNOSIS — K654 Sclerosing mesenteritis: Secondary | ICD-10-CM | POA: Diagnosis not present

## 2020-01-15 ENCOUNTER — Ambulatory Visit: Payer: Medicare PPO | Admitting: Family Medicine

## 2020-01-31 ENCOUNTER — Encounter: Payer: Self-pay | Admitting: Family Medicine

## 2020-01-31 ENCOUNTER — Ambulatory Visit: Payer: Medicare PPO | Admitting: Family Medicine

## 2020-01-31 ENCOUNTER — Other Ambulatory Visit: Payer: Self-pay

## 2020-01-31 VITALS — BP 124/70 | HR 56 | Resp 16 | Ht 73.0 in | Wt 301.5 lb

## 2020-01-31 DIAGNOSIS — Z23 Encounter for immunization: Secondary | ICD-10-CM

## 2020-01-31 DIAGNOSIS — E785 Hyperlipidemia, unspecified: Secondary | ICD-10-CM

## 2020-01-31 DIAGNOSIS — I10 Essential (primary) hypertension: Secondary | ICD-10-CM | POA: Diagnosis not present

## 2020-01-31 DIAGNOSIS — R351 Nocturia: Secondary | ICD-10-CM

## 2020-01-31 DIAGNOSIS — R7303 Prediabetes: Secondary | ICD-10-CM

## 2020-01-31 DIAGNOSIS — N401 Enlarged prostate with lower urinary tract symptoms: Secondary | ICD-10-CM | POA: Diagnosis not present

## 2020-01-31 DIAGNOSIS — E1169 Type 2 diabetes mellitus with other specified complication: Secondary | ICD-10-CM | POA: Insufficient documentation

## 2020-01-31 DIAGNOSIS — E890 Postprocedural hypothyroidism: Secondary | ICD-10-CM | POA: Insufficient documentation

## 2020-01-31 DIAGNOSIS — E65 Localized adiposity: Secondary | ICD-10-CM

## 2020-01-31 MED ORDER — FINASTERIDE 5 MG PO TABS: 5.0000 mg | ORAL_TABLET | Freq: Every day | ORAL | 2 refills | Status: DC

## 2020-01-31 NOTE — Progress Notes (Signed)
HPI: Isaac Carpenter is a 69 y.o. male, who is here today with his wife to establish care.  Former PCP: Dr Tamala Julian Last preventive routine visit: > a year ago. AWV 12/01/18.  Chronic medical problems: Bradycardia,HTN,and HLD among some.  He was in the ER on 11/07/19 because syncope. He left before he was seen.  His wife thinks he passed out a couple min, he is not sure. He thinks he was dehydrated. He was working outdoors during a hot weather. EMS checked BP: BP 60/? ,could not get DBP. Negative for severe/frequent headache, visual changes, chest pain, dyspnea, palpitation, focal weakness, or edema. In 2-13 he was in the ER due to MS changes. Neuro evaluation, did not think it was a TIA.  He had LLB pain that lasted about 6 weeks. Started after episodes, no fall, leaning toward left. Pain was not constant. He had an abdominal/pelvic CT on 12/07/19 because left flank pain.  Changes in the small bowel mesentery consistent with mesenteric panniculitis. No associated adenopathy, fluid collection or focal mass lesion is seen. Nonobstructing right renal stone. No other focal abnormality is noted.  Pain has resolved. He took Advil for 10 day  He was very concerned and according to pt, a friend (physician) recommended asking pcp for abdominal MRI because panniculitis is not seen on imaging w/o contrast. He did not have abdominal pain,N/V,or changes in bowel habits.  He was upset because pcp did not order MRI. He has not had colonoscopy in over 10 years, he thinks he has one at 23.  HLD: He is not on pharmacologic treatment. Last FLP on 12/01/18: TC 223,HDL 42,LDL 154,and TG 133. HgA1C 6.3 in 11/2018. Negative for polydipsia,polyuria, or polyphagia.  He does not exercise regularly but he is "very active" outdoors.  Concerns today: Urine frequency. Nocturia x 1-2. He was already evaluated by urologist, continued on Flomax, even though, according to pt,he reported it was not  helping. He has not seen urologist in 2 years. Denies dysuria,gross hematuria,or decreased urine output.  HTN: Dx'ed about 4-5 years ago. Negative for severe/frequent headache, visual changes, chest pain, dyspnea, palpitation, claudication, focal weakness, or edema. He is on Lisinopril-HCTZ 20-25 mg daily.  Hypothyroidism: 50 years ago he underwent partial thyroidectomy. He is on Levothyroxine 88 mcg daily.  Review of Systems  Constitutional: Negative for activity change, appetite change, fatigue and fever.  HENT: Negative for mouth sores, nosebleeds and sore throat.   Respiratory: Negative for cough and wheezing.   Gastrointestinal: Negative for abdominal pain, nausea and vomiting.  Endocrine: Negative for cold intolerance and heat intolerance.  Genitourinary: Negative for decreased urine volume, dysuria and hematuria.  Musculoskeletal: Negative for gait problem and myalgias.  Neurological: Negative for syncope, facial asymmetry and weakness.  Rest see pertinent positives and negatives per HPI.  Current Outpatient Medications on File Prior to Visit  Medication Sig Dispense Refill   aspirin EC 81 MG tablet Take 81 mg by mouth daily. Swallow whole.     levothyroxine (SYNTHROID) 88 MCG tablet Take 1 tablet by mouth daily.     lisinopril-hydrochlorothiazide (ZESTORETIC) 20-25 MG tablet Take 1 tablet by mouth daily.     tamsulosin (FLOMAX) 0.4 MG CAPS capsule Take 1 capsule by mouth daily.     No current facility-administered medications on file prior to visit.   Past Medical History:  Diagnosis Date   Arthritis    History of partial thyroidectomy STATES OVERACTIVE THYROID-- NO ISSUES SINCE AGE 5 AND NO MEDS  Hypertension    Thyroid disease    No Known Allergies  Family History  Problem Relation Age of Onset   Cancer Mother    COPD Father    Heart attack Father    Cancer Sister    Cancer Brother    Cancer Sister    Cancer Brother     Social History    Socioeconomic History   Marital status: Married    Spouse name: Not on file   Number of children: Not on file   Years of education: Not on file   Highest education level: Not on file  Occupational History   Not on file  Tobacco Use   Smoking status: Never Smoker   Smokeless tobacco: Current User    Types: Chew   Tobacco comment: CHEWED FOR 10 YRS  Substance and Sexual Activity   Alcohol use: Not on file   Drug use: No   Sexual activity: Not on file  Other Topics Concern   Not on file  Social History Narrative   Not on file   Social Determinants of Health   Financial Resource Strain:    Difficulty of Paying Living Expenses: Not on file  Food Insecurity:    Worried About [place] in the Last Year: Not on file   [employer] of Food in the Last Year: Not on file  Transportation Needs:    Lack of Transportation (Medical): Not on file   Lack of Transportation (Non-Medical): Not on file  Physical Activity:    Days of Exercise per Week: Not on file   Minutes of Exercise per Session: Not on file  Stress:    Feeling of Stress : Not on file  Social Connections:    Frequency of Communication with Friends and Family: Not on file   Frequency of Social Gatherings with Friends and Family: Not on file   Attends Religious Services: Not on file   Active Member of Clubs or Organizations: Not on file   Attends Archivist Meetings: Not on file   Marital Status: Not on file   Vitals:   01/31/20 1017 01/31/20 1123  BP: 124/70   Pulse: 71 (!) 56  Resp: 16   SpO2: 97%    Body mass index is 39.78 kg/m.  Physical Exam Nursing note reviewed.  Constitutional:      General: He is not in acute distress.    Appearance: He is well-developed.  HENT:     Head: Normocephalic and atraumatic.  Eyes:     Conjunctiva/sclera: Conjunctivae normal.     Pupils: Pupils are equal, round, and reactive to light.  Cardiovascular:     Rate and Rhythm:  Regular rhythm. Bradycardia present.     Pulses:          Dorsalis pedis pulses are 2+ on the right side and 2+ on the left side.     Heart sounds: No murmur heard.   Pulmonary:     Effort: Pulmonary effort is normal. No respiratory distress.     Breath sounds: Normal breath sounds.  Abdominal:     Palpations: Abdomen is soft. There is no hepatomegaly or mass.     Tenderness: There is no abdominal tenderness.  Lymphadenopathy:     Cervical: No cervical adenopathy.  Skin:    General: Skin is warm.     Findings: No erythema or rash.  Neurological:     Mental Status: He is alert and oriented to person, place, and time.  Cranial Nerves: No cranial nerve deficit.     Gait: Gait normal.  Psychiatric:     Comments: Well groomed, good eye contact.   ASSESSMENT AND PLAN:  Isaac Carpenter was seen today for establish care.  Diagnoses and all orders for this visit: Orders Placed This Encounter  Procedures   Flu Vaccine QUAD High Dose(Fluad)   CBC with Differential/Platelet   C-reactive protein   Lipid panel   Hemoglobin A1c   TSH   COMPLETE METABOLIC PANEL WITH GFR   Urinalysis, Routine w reflex microscopic   PSA   Lab Results  Component Value Date   ALT 34 01/31/2020   AST 40 (H) 01/31/2020   ALKPHOS 46 03/04/2012   BILITOT 0.6 01/31/2020   Lab Results  Component Value Date   TSH 3.81 01/31/2020   Lab Results  Component Value Date   PSA 8.68 (H) 01/31/2020   Lab Results  Component Value Date   HGBA1C 6.5 (H) 01/31/2020   Lab Results  Component Value Date   CREATININE 0.72 01/31/2020   BUN 23 01/31/2020   NA 141 01/31/2020   K 4.2 01/31/2020   CL 101 01/31/2020   CO2 30 01/31/2020   Lab Results  Component Value Date   WBC 5.3 01/31/2020   HGB 14.2 01/31/2020   HCT 43.4 01/31/2020   MCV 93.7 01/31/2020   PLT 229 01/31/2020   Lab Results  Component Value Date   CHOL 220 (H) 01/31/2020   HDL 43 01/31/2020   LDLCALC 149 (H) 01/31/2020   TRIG  153 (H) 01/31/2020   CHOLHDL 5.1 (H) 01/31/2020   Lab Results  Component Value Date   CRP 5.9 01/31/2020   The 10-year ASCVD risk score Mikey Bussing DC Jr., et al., 2013) is: 19.6%   Values used to calculate the score:     Age: 60 years     Sex: Male     Is Non-Hispanic African American: No     Diabetic: No     Tobacco smoker: No     Systolic Blood Pressure: 062 mmHg     Is BP treated: Yes     HDL Cholesterol: 43 mg/dL     Total Cholesterol: 220 mg/dL  Prediabetes Healthy life style and wt loss recommended for primary prevention.  BPH associated with nocturia Recommend arranging f/u appt with his urologist. Continue Flomax 0.4 mg daily. Proscar 5 mg added today. Some side effects discussed.  Hypertension, essential, benign BP adequately controlled. Continue Lisinopril-HCTZ 20-25 mg daily and low salt diet.  Hyperlipidemia, unspecified hyperlipidemia type Continue non pharmacologic treatment.  Abdominal panniculus On abdominal CT. Asymptomatic. Continue monitoring for GI symptoms. Further recommendations according to lab results.  Hypothyroidism, postsurgical Continue Levothyroxine 88 mcg daily. Further recommendations according to THS result.  Need for influenza vaccination -     Flu Vaccine QUAD High Dose(Fluad)   Return in about 3 months (around 05/02/2020).   Becky Colan G. [place], MD  [facility]. [place] office.   A few things to remember from today's visit:   Hypertension, essential, benign - Plan: CBC with Differential/Platelet, COMPLETE METABOLIC PANEL WITH GFR  Prediabetes - Plan: Hemoglobin A1c  BPH associated with nocturia - Plan: Urinalysis, Routine w reflex microscopic, PSA, finasteride (PROSCAR) 5 MG tablet  Hyperlipidemia, unspecified hyperlipidemia type - Plan: Lipid panel, COMPLETE METABOLIC PANEL WITH GFR  Abdominal panniculus - Plan: CBC with Differential/Platelet, C-reactive protein  Hypothyroidism, postsurgical - Plan:  TSH  Today Proscar 5 mg added to help with prostate.  If you need refills please call your pharmacy. Do not use My Chart to request refills or for acute issues that need immediate attention.    Please be sure medication list is accurate. If a new problem present, please set up appointment sooner than planned today.

## 2020-01-31 NOTE — Patient Instructions (Signed)
A few things to remember from today's visit:   Hypertension, essential, benign - Plan: CBC with Differential/Platelet, COMPLETE METABOLIC PANEL WITH GFR  Prediabetes - Plan: Hemoglobin A1c  BPH associated with nocturia - Plan: Urinalysis, Routine w reflex microscopic, PSA, finasteride (PROSCAR) 5 MG tablet  Hyperlipidemia, unspecified hyperlipidemia type - Plan: Lipid panel, COMPLETE METABOLIC PANEL WITH GFR  Abdominal panniculus - Plan: CBC with Differential/Platelet, C-reactive protein  Hypothyroidism, postsurgical - Plan: TSH  Today Proscar 5 mg added to help with prostate.  If you need refills please call your pharmacy. Do not use My Chart to request refills or for acute issues that need immediate attention.    Please be sure medication list is accurate. If a new problem present, please set up appointment sooner than planned today.

## 2020-02-01 LAB — COMPLETE METABOLIC PANEL WITH GFR
AG Ratio: 1.6 (calc) (ref 1.0–2.5)
ALT: 34 U/L (ref 9–46)
AST: 40 U/L — ABNORMAL HIGH (ref 10–35)
Albumin: 4.2 g/dL (ref 3.6–5.1)
Alkaline phosphatase (APISO): 47 U/L (ref 35–144)
BUN: 23 mg/dL (ref 7–25)
CO2: 30 mmol/L (ref 20–32)
Calcium: 9.6 mg/dL (ref 8.6–10.3)
Chloride: 101 mmol/L (ref 98–110)
Creat: 0.72 mg/dL (ref 0.70–1.25)
GFR, Est African American: 111 mL/min/{1.73_m2} (ref >=60)
GFR, Est Non African American: 96 mL/min/{1.73_m2} (ref >=60)
Globulin: 2.6 g/dL (calc) (ref 1.9–3.7)
Glucose, Bld: 111 mg/dL — ABNORMAL HIGH (ref 65–99)
Potassium: 4.2 mmol/L (ref 3.5–5.3)
Sodium: 141 mmol/L (ref 135–146)
Total Bilirubin: 0.6 mg/dL (ref 0.2–1.2)
Total Protein: 6.8 g/dL (ref 6.1–8.1)

## 2020-02-01 LAB — LIPID PANEL
Cholesterol: 220 mg/dL — ABNORMAL HIGH (ref <200)
HDL: 43 mg/dL (ref >=40)
LDL Cholesterol (Calc): 149 mg/dL (calc) — ABNORMAL HIGH
Non-HDL Cholesterol (Calc): 177 mg/dL (calc) — ABNORMAL HIGH (ref <130)
Total CHOL/HDL Ratio: 5.1 (calc) — ABNORMAL HIGH (ref <5.0)
Triglycerides: 153 mg/dL — ABNORMAL HIGH (ref <150)

## 2020-02-01 LAB — CBC WITH DIFFERENTIAL/PLATELET
Absolute Monocytes: 572 cells/uL (ref 200–950)
Basophils Absolute: 32 cells/uL (ref 0–200)
Basophils Relative: 0.6 %
Eosinophils Absolute: 159 cells/uL (ref 15–500)
Eosinophils Relative: 3 %
HCT: 43.4 % (ref 38.5–50.0)
Hemoglobin: 14.2 g/dL (ref 13.2–17.1)
Lymphs Abs: 1961 cells/uL (ref 850–3900)
MCH: 30.7 pg (ref 27.0–33.0)
MCHC: 32.7 g/dL (ref 32.0–36.0)
MCV: 93.7 fL (ref 80.0–100.0)
MPV: 9.9 fL (ref 7.5–12.5)
Monocytes Relative: 10.8 %
Neutro Abs: 2576 cells/uL (ref 1500–7800)
Neutrophils Relative %: 48.6 %
Platelets: 229 10*3/uL (ref 140–400)
RBC: 4.63 10*6/uL (ref 4.20–5.80)
RDW: 13.2 % (ref 11.0–15.0)
Total Lymphocyte: 37 %
WBC: 5.3 10*3/uL (ref 3.8–10.8)

## 2020-02-01 LAB — C-REACTIVE PROTEIN: CRP: 5.9 mg/L (ref <8.0)

## 2020-02-01 LAB — PSA: PSA: 8.68 ng/mL — ABNORMAL HIGH (ref <=4.0)

## 2020-02-01 LAB — HEMOGLOBIN A1C
Hgb A1c MFr Bld: 6.5 % of total Hgb — ABNORMAL HIGH (ref <5.7)
Mean Plasma Glucose: 140 (calc)
eAG (mmol/L): 7.7 (calc)

## 2020-02-01 LAB — TSH: TSH: 3.81 mIU/L (ref 0.40–4.50)

## 2020-02-03 ENCOUNTER — Encounter: Payer: Self-pay | Admitting: Family Medicine

## 2020-02-14 ENCOUNTER — Telehealth: Payer: Self-pay | Admitting: *Deleted

## 2020-02-14 DIAGNOSIS — R972 Elevated prostate specific antigen [PSA]: Secondary | ICD-10-CM

## 2020-02-14 DIAGNOSIS — Z1211 Encounter for screening for malignant neoplasm of colon: Secondary | ICD-10-CM

## 2020-02-14 MED ORDER — ATORVASTATIN CALCIUM 20 MG PO TABS: 20.0000 mg | ORAL_TABLET | Freq: Every day | ORAL | 6 refills | Status: DC

## 2020-02-14 NOTE — Telephone Encounter (Signed)
Patient's wife called for a couple things.  1. Referral to urology since his PSA count was abnormal. 2. Referral to gastro for a colonoscopy. 3. Requesting a cholesterol medication.  Please advise [phone]

## 2020-02-14 NOTE — Telephone Encounter (Signed)
Referrals placed & medication sent in. Pt's spouse is aware.

## 2020-02-23 ENCOUNTER — Encounter: Payer: Self-pay | Admitting: Gastroenterology

## 2020-02-28 DIAGNOSIS — R972 Elevated prostate specific antigen [PSA]: Secondary | ICD-10-CM | POA: Diagnosis not present

## 2020-02-28 DIAGNOSIS — N401 Enlarged prostate with lower urinary tract symptoms: Secondary | ICD-10-CM | POA: Diagnosis not present

## 2020-02-28 DIAGNOSIS — N481 Balanitis: Secondary | ICD-10-CM | POA: Diagnosis not present

## 2020-02-28 DIAGNOSIS — N4883 Acquired buried penis: Secondary | ICD-10-CM | POA: Diagnosis not present

## 2020-02-28 DIAGNOSIS — R350 Frequency of micturition: Secondary | ICD-10-CM | POA: Diagnosis not present

## 2020-03-05 ENCOUNTER — Telehealth: Payer: Self-pay | Admitting: Family Medicine

## 2020-03-05 MED ORDER — LISINOPRIL-HYDROCHLOROTHIAZIDE 20-25 MG PO TABS: 1.0000 | ORAL_TABLET | Freq: Every day | ORAL | 1 refills | Status: DC

## 2020-03-05 NOTE — Telephone Encounter (Signed)
Patient wife is calling and requesting a refill for lisinopril-hydrochlorothiazide (ZESTORETIC) 20-25 MG tablet sent to [facility], [place]  Phone:  [phone] Fax:  [phone] CB is [phone]

## 2020-03-05 NOTE — Telephone Encounter (Signed)
Rx sent 

## 2020-03-25 ENCOUNTER — Ambulatory Visit (AMBULATORY_SURGERY_CENTER): Payer: Self-pay

## 2020-03-25 ENCOUNTER — Other Ambulatory Visit: Payer: Self-pay

## 2020-03-25 ENCOUNTER — Telehealth: Payer: Self-pay | Admitting: Family Medicine

## 2020-03-25 VITALS — Ht 73.0 in | Wt 301.0 lb

## 2020-03-25 DIAGNOSIS — Z1211 Encounter for screening for malignant neoplasm of colon: Secondary | ICD-10-CM

## 2020-03-25 MED ORDER — NA SULFATE-K SULFATE-MG SULF 17.5-3.13-1.6 GM/177ML PO SOLN: 1.0000 | Freq: Once | ORAL | 0 refills | Status: AC

## 2020-03-25 NOTE — Telephone Encounter (Signed)
NO ANSWER/NO VM. Pt due to schedule Medicare Annual Wellness Visit (AWV) either virtually or in office.  NO HX; please schedule at anytime with [facility] Nurse Health Advisor 2.  This should be a 45 minute visit.  AWV-I PER [facility] AS OF 04/20/2017

## 2020-03-25 NOTE — Progress Notes (Signed)

## 2020-04-07 ENCOUNTER — Encounter: Payer: Self-pay | Admitting: Certified Registered Nurse Anesthetist

## 2020-04-08 ENCOUNTER — Encounter: Payer: Self-pay | Admitting: Gastroenterology

## 2020-04-08 ENCOUNTER — Other Ambulatory Visit: Payer: Self-pay

## 2020-04-08 ENCOUNTER — Ambulatory Visit (AMBULATORY_SURGERY_CENTER): Payer: Medicare PPO | Admitting: Gastroenterology

## 2020-04-08 VITALS — BP 94/52 | HR 45 | Temp 97.7°F | Resp 11 | Ht 73.0 in | Wt 301.0 lb

## 2020-04-08 DIAGNOSIS — K635 Polyp of colon: Secondary | ICD-10-CM | POA: Diagnosis not present

## 2020-04-08 DIAGNOSIS — Z1211 Encounter for screening for malignant neoplasm of colon: Secondary | ICD-10-CM | POA: Diagnosis not present

## 2020-04-08 DIAGNOSIS — E785 Hyperlipidemia, unspecified: Secondary | ICD-10-CM | POA: Diagnosis not present

## 2020-04-08 DIAGNOSIS — D122 Benign neoplasm of ascending colon: Secondary | ICD-10-CM

## 2020-04-08 DIAGNOSIS — I10 Essential (primary) hypertension: Secondary | ICD-10-CM | POA: Diagnosis not present

## 2020-04-08 DIAGNOSIS — D125 Benign neoplasm of sigmoid colon: Secondary | ICD-10-CM

## 2020-04-08 DIAGNOSIS — K641 Second degree hemorrhoids: Secondary | ICD-10-CM

## 2020-04-08 HISTORY — PX: COLONOSCOPY: SHX174

## 2020-04-08 MED ORDER — SODIUM CHLORIDE 0.9 % IV SOLN: 500.0000 mL | Freq: Once | INTRAVENOUS | Status: DC

## 2020-04-08 NOTE — Progress Notes (Signed)
Called to room to assist during endoscopic procedure.  Patient ID and intended procedure confirmed with present staff. Received instructions for my participation in the procedure from the performing physician.  

## 2020-04-08 NOTE — Patient Instructions (Addendum)
Handouts Provided:  Polyps and Hemorrhoids  YOU HAD AN ENDOSCOPIC PROCEDURE TODAY AT THE [facility]:   Refer to the procedure report that was given to you for any specific questions about what was found during the examination.  If the procedure report does not answer your questions, please call your gastroenterologist to clarify.  If you requested that your care partner not be given the details of your procedure findings, then the procedure report has been included in a sealed envelope for you to review at your convenience later.  YOU SHOULD EXPECT: Some feelings of bloating in the abdomen. Passage of more gas than usual.  Walking can help get rid of the air that was put into your GI tract during the procedure and reduce the bloating. If you had a lower endoscopy (such as a colonoscopy or flexible sigmoidoscopy) you may notice spotting of blood in your stool or on the toilet paper. If you underwent a bowel prep for your procedure, you may not have a normal bowel movement for a few days.  Please Note:  You might notice some irritation and congestion in your nose or some drainage.  This is from the oxygen used during your procedure.  There is no need for concern and it should clear up in a day or so.  SYMPTOMS TO REPORT IMMEDIATELY:   Following lower endoscopy (colonoscopy or flexible sigmoidoscopy):  Excessive amounts of blood in the stool  Significant tenderness or worsening of abdominal pains  Swelling of the abdomen that is new, acute  Fever of 100F or higher  For urgent or emergent issues, a gastroenterologist can be reached at any hour by calling [phone]. Do not use MyChart messaging for urgent concerns.    DIET:  We do recommend a small meal at first, but then you may proceed to your regular diet.  Drink plenty of fluids but you should avoid alcoholic beverages for 24 hours.  ACTIVITY:  You should plan to take it easy for the rest of today and you should NOT DRIVE or  use heavy machinery until tomorrow (because of the sedation medicines used during the test).    FOLLOW UP: Our staff will call the number listed on your records 48-72 hours following your procedure to check on you and address any questions or concerns that you may have regarding the information given to you following your procedure. If we do not reach you, we will leave a message.  We will attempt to reach you two times.  During this call, we will ask if you have developed any symptoms of COVID 19. If you develop any symptoms (ie: fever, flu-like symptoms, shortness of breath, cough etc.) before then, please call [phone].  If you test positive for Covid 19 in the 2 weeks post procedure, please call and report this information to us.    If any biopsies were taken you will be contacted by phone or by letter within the next 1-3 weeks.  Please call us at [phone] if you have not heard about the biopsies in 3 weeks.    SIGNATURES/CONFIDENTIALITY: You and/or your care partner have signed paperwork which will be entered into your electronic medical record.  These signatures attest to the fact that that the information above on your After Visit Summary has been reviewed and is understood.  Full responsibility of the confidentiality of this discharge information lies with you and/or your care-partner.  

## 2020-04-08 NOTE — Progress Notes (Signed)
Report given to PACU, vss 

## 2020-04-08 NOTE — Op Note (Signed)
[name]: Isaac Carpenter Procedure Date: 04/08/2020 8:30 AM MRN: [id number] Endoscopist: Gerrit Heck , MD Age: 69 Referring MD:  Date of Birth: [birth year] Gender: Male Account #: [id number] Procedure:                Colonoscopy Indications:              Screening for colorectal malignant neoplasm, This                            is the patient's first colonoscopy Medicines:                Monitored Anesthesia Care Procedure:                Pre-Anesthesia Assessment:                           - Prior to the procedure, a History and Physical                            was performed, and patient medications and                            allergies were reviewed. The patient's tolerance of                            previous anesthesia was also reviewed. The risks                            and benefits of the procedure and the sedation                            options and risks were discussed with the patient.                            All questions were answered, and informed consent                            was obtained. Prior Anticoagulants: The patient has                            taken no previous anticoagulant or antiplatelet                            agents. ASA Grade Assessment: II - A patient with                            mild systemic disease. After reviewing the risks                            and benefits, the patient was deemed in                            satisfactory condition to undergo the procedure.  After obtaining informed consent, the colonoscope                            was passed under direct vision. Throughout the                            procedure, the patient's blood pressure, pulse, and                            oxygen saturations were monitored continuously. The                            Olympus CF-HQ190 [id number]) Colonoscope was                            introduced through the anus and  advanced to the the                            terminal ileum. The colonoscopy was performed                            without difficulty. The patient tolerated the                            procedure well. The quality of the bowel                            preparation was good. The terminal ileum, ileocecal                            valve, appendiceal orifice, and rectum were                            photographed. Scope In: 8:37:38 AM Scope Out: 8:50:48 AM Scope Withdrawal Time: 0 hours 12 minutes 22 seconds  Total Procedure Duration: 0 hours 13 minutes 10 seconds  Findings:                 The perianal and digital rectal examinations were                            normal.                           Three sessile polyps were found in the sigmoid                            colon (2) and ascending colon. The polyps were 3 to                            6 mm in size. These polyps were removed with a cold                            snare. Resection and retrieval were complete.  Estimated blood loss was minimal.                           Non-bleeding internal hemorrhoids were found during                            retroflexion. The hemorrhoids were small and Grade                            II (internal hemorrhoids that prolapse but reduce                            spontaneously).                           The terminal ileum appeared normal. Complications:            No immediate complications. Estimated Blood Loss:     Estimated blood loss was minimal. Impression:               - Three 3 to 6 mm polyps in the sigmoid colon and                            in the ascending colon, removed with a cold snare.                            Resected and retrieved.                           - Non-bleeding internal hemorrhoids.                           - The examined portion of the ileum was normal. Recommendation:           - Patient has a contact number available for                             emergencies. The signs and symptoms of potential                            delayed complications were discussed with the                            patient. Return to normal activities tomorrow.                            Written discharge instructions were provided to the                            patient.                           - Resume previous diet.                           - Continue present medications.                           -  Await pathology results.                           - Repeat colonoscopy for surveillance based on                            pathology results.                           - Return to GI office PRN.                           - Internal hemorrhoids were noted on this study and                            may be amenable to hemorrhoid band ligation. If you                            are interested in further treatment of these                            hemorrhoids with band ligation, please contact my                            clinic to set up an appointment for evaluation and                            treatment.                           - Use fiber, for example Citrucel, Fibercon, Konsyl                            or Metamucil. Gerrit Heck, MD 04/08/2020 8:58:15 AM

## 2020-04-08 NOTE — Progress Notes (Signed)
Pt's states no medical or surgical changes since previsit or office visit. 

## 2020-04-11 ENCOUNTER — Telehealth: Payer: Self-pay

## 2020-04-11 NOTE — Telephone Encounter (Signed)
  Follow up Call-  Call back number 04/08/2020  Post procedure Call Back phone  # [phone]  Permission to leave phone message Yes  Some recent data might be hidden     Patient questions:  Do you have a fever, pain , or abdominal swelling? No. Pain Score  0 *  Have you tolerated food without any problems? Yes.    Have you been able to return to your normal activities? Yes.    Do you have any questions about your discharge instructions: Diet   No. Medications  No. Follow up visit  No.  Do you have questions or concerns about your Care? No.  Actions: * If pain score is 4 or above: No action needed, pain <4.  1. Have you developed a fever since your procedure? no  2.   Have you had an respiratory symptoms (SOB or cough) since your procedure? no  3.   Have you tested positive for COVID 19 since your procedure no  4.   Have you had any family members/close contacts diagnosed with the COVID 19 since your procedure?  no   If yes to any of these questions please route to Joylene John, RN and Joella Prince, RN

## 2020-04-18 DIAGNOSIS — R972 Elevated prostate specific antigen [PSA]: Secondary | ICD-10-CM | POA: Diagnosis not present

## 2020-04-25 ENCOUNTER — Encounter: Payer: Self-pay | Admitting: Gastroenterology

## 2020-04-26 DIAGNOSIS — R350 Frequency of micturition: Secondary | ICD-10-CM | POA: Diagnosis not present

## 2020-04-26 DIAGNOSIS — N401 Enlarged prostate with lower urinary tract symptoms: Secondary | ICD-10-CM | POA: Diagnosis not present

## 2020-04-26 DIAGNOSIS — R972 Elevated prostate specific antigen [PSA]: Secondary | ICD-10-CM | POA: Diagnosis not present

## 2020-05-02 ENCOUNTER — Telehealth: Payer: Self-pay | Admitting: Family Medicine

## 2020-05-02 NOTE — Telephone Encounter (Signed)
Spoke with spouse she will have patient call back to  schedule Medicare Annual Wellness Visit (AWV) either virtually or in office.   Last AWV no information  please schedule at anytime with [facility] Nurse Health Advisor 1 or 2   This should be a 45 minute visit.

## 2020-07-03 ENCOUNTER — Telehealth: Payer: Self-pay | Admitting: Family Medicine

## 2020-07-03 NOTE — Telephone Encounter (Signed)
Left message for patient to call back and schedule Medicare Annual Wellness Visit (AWV) either virtually or in office. No detailed message left   AWVI  please schedule at anytime with [facility] Nurse Health Advisor 1 or 2   This should be a 45 minute visit. 

## 2020-07-23 ENCOUNTER — Ambulatory Visit: Payer: Medicare PPO | Admitting: Family Medicine

## 2020-07-23 ENCOUNTER — Encounter: Payer: Self-pay | Admitting: Family Medicine

## 2020-07-23 ENCOUNTER — Other Ambulatory Visit: Payer: Self-pay

## 2020-07-23 VITALS — BP 130/70 | HR 72 | Resp 16 | Ht 73.0 in | Wt 300.5 lb

## 2020-07-23 DIAGNOSIS — E890 Postprocedural hypothyroidism: Secondary | ICD-10-CM

## 2020-07-23 DIAGNOSIS — R0600 Dyspnea, unspecified: Secondary | ICD-10-CM | POA: Diagnosis not present

## 2020-07-23 DIAGNOSIS — G4733 Obstructive sleep apnea (adult) (pediatric): Secondary | ICD-10-CM | POA: Diagnosis not present

## 2020-07-23 DIAGNOSIS — Z1159 Encounter for screening for other viral diseases: Secondary | ICD-10-CM

## 2020-07-23 DIAGNOSIS — E1169 Type 2 diabetes mellitus with other specified complication: Secondary | ICD-10-CM

## 2020-07-23 DIAGNOSIS — E118 Type 2 diabetes mellitus with unspecified complications: Secondary | ICD-10-CM | POA: Diagnosis not present

## 2020-07-23 DIAGNOSIS — R7401 Elevation of levels of liver transaminase levels: Secondary | ICD-10-CM

## 2020-07-23 DIAGNOSIS — I10 Essential (primary) hypertension: Secondary | ICD-10-CM | POA: Insufficient documentation

## 2020-07-23 LAB — BRAIN NATRIURETIC PEPTIDE: Pro B Natriuretic peptide (BNP): 46 pg/mL (ref 0.0–100.0)

## 2020-07-23 LAB — HEPATIC FUNCTION PANEL
ALT: 33 U/L (ref 0–53)
AST: 33 U/L (ref 0–37)
Albumin: 4.1 g/dL (ref 3.5–5.2)
Alkaline Phosphatase: 55 U/L (ref 39–117)
Bilirubin, Direct: 0.1 mg/dL (ref 0.0–0.3)
Total Bilirubin: 0.6 mg/dL (ref 0.2–1.2)
Total Protein: 6.5 g/dL (ref 6.0–8.3)

## 2020-07-23 LAB — BASIC METABOLIC PANEL
BUN: 21 mg/dL (ref 6–23)
CO2: 34 mEq/L — ABNORMAL HIGH (ref 19–32)
Calcium: 9.5 mg/dL (ref 8.4–10.5)
Chloride: 101 mEq/L (ref 96–112)
Creatinine, Ser: 0.75 mg/dL (ref 0.40–1.50)
GFR: 92.29 mL/min (ref >60.00)
Glucose, Bld: 108 mg/dL — ABNORMAL HIGH (ref 70–99)
Potassium: 3.9 mEq/L (ref 3.5–5.1)
Sodium: 142 mEq/L (ref 135–145)

## 2020-07-23 LAB — POCT GLYCOSYLATED HEMOGLOBIN (HGB A1C): HbA1c, POC (prediabetic range): 6.4 % (ref 5.7–6.4)

## 2020-07-23 LAB — MICROALBUMIN / CREATININE URINE RATIO
Creatinine,U: 87.8 mg/dL
Microalb Creat Ratio: 1.2 mg/g (ref 0.0–30.0)
Microalb, Ur: 1 mg/dL (ref 0.0–1.9)

## 2020-07-23 MED ORDER — LISINOPRIL-HYDROCHLOROTHIAZIDE 20-25 MG PO TABS: 1.0000 | ORAL_TABLET | Freq: Every day | ORAL | 2 refills | Status: DC

## 2020-07-23 MED ORDER — LEVOTHYROXINE SODIUM 88 MCG PO TABS: 88.0000 ug | ORAL_TABLET | Freq: Every day | ORAL | 2 refills | Status: DC

## 2020-07-23 NOTE — Progress Notes (Signed)
Chief Complaint  Patient presents with  . Insomnia  . Follow-up   HPI: [name] is a pleasant 70 y.o. male, who is here today with his wife complaining of difficulty staying asleep. He has some hearing loss, his wife helps me with obtaining hx.  Problem has been going on for a couple of years,intermittently. He has no problem falling asleep but wakes up in the middle of the night with difficulty breathing. He wakes up a few times during the night. Most of the time he ends up moving to his recliner and sleeps better through the night. It does not happen every night.  He has not identified exacerbating factors.  About 2 years ago he had a sleep study and according to wife CPAP was recommended but he could not tolerate a mask. He feels rested most of the time when he first wakes up in the morning. Sleeps on 1 pillow, denies orthopnea or edema.  Hypothyroidism on Synthroid 88 mcg daily, he has not taken medication for 4 to 5 days, he needs a refill. Denies abnormal weight loss, night sweats, or tremor.  Lab Results  Component Value Date   TSH 3.81 01/31/2020   DM2: Dx'ed 01/2020. Last eye exam a few years ago,has an appt coming. According to his wife,he eats a lot of sweets. Negative for polydipsia,polyuria, or polyphagia.  Lab Results  Component Value Date   HGBA1C 6.5 (H) 01/31/2020   Mildly elevated AST. Denies high alcohol intake. He has not noted abdominal pain, stool/urine color changes,or jaundice.  Lab Results  Component Value Date   ALT 34 01/31/2020   AST 40 (H) 01/31/2020   ALKPHOS 46 03/04/2012   BILITOT 0.6 01/31/2020   HTN: Negative for severe/frequent headache, visual changes, chest pain, dyspnea, palpitation, focal weakness, or worsening edema. Currently he is on lisinopril-HCTZ 20-25 mg daily.  Lab Results  Component Value Date   CREATININE 0.72 01/31/2020   BUN 23 01/31/2020   NA 141 01/31/2020   K 4.2 01/31/2020   CL 101 01/31/2020    CO2 30 01/31/2020   Since his last visit he has seen urologist and PSA repeated and "fine."  Review of Systems  Constitutional: Negative for activity change, appetite change and fever.  HENT: Negative for mouth sores, nosebleeds and sore throat.   Eyes: Positive for discharge (epiphora). Negative for visual disturbance.  Respiratory: Negative for cough and wheezing.   Gastrointestinal: Negative for nausea and vomiting.       No changes in bowel habits.  Endocrine: Negative for cold intolerance and heat intolerance.  Genitourinary: Negative for decreased urine volume and hematuria.  Allergic/Immunologic: Positive for environmental allergies.  Neurological: Negative for syncope, facial asymmetry and numbness.  Psychiatric/Behavioral: Positive for sleep disturbance. Negative for confusion.  Rest see pertinent positives and negatives per HPI.  Current Outpatient Medications on File Prior to Visit  Medication Sig Dispense Refill  . aspirin EC 81 MG tablet Take 81 mg by mouth daily. Swallow whole.    Marland Kitchen atorvastatin (LIPITOR) 20 MG tablet Take 1 tablet (20 mg total) by mouth daily. 30 tablet 6  . tamsulosin (FLOMAX) 0.4 MG CAPS capsule Take 1 capsule by mouth daily.     No current facility-administered medications on file prior to visit.   Past Medical History:  Diagnosis Date  . Arthritis   . History of partial thyroidectomy STATES OVERACTIVE THYROID-- NO ISSUES SINCE AGE 37 AND NO MEDS  . Hyperlipidemia   . Hypertension   .  Thyroid disease    Allergies  Allergen Reactions  . Testosterone Other (See Comments)    Social History   Socioeconomic History  . Marital status: Married    Spouse name: Not on file  . Number of children: Not on file  . Years of education: Not on file  . Highest education level: Not on file  Occupational History  . Not on file  Tobacco Use  . Smoking status: Never Smoker  . Smokeless tobacco: Current User    Types: Chew  . Tobacco comment:  started chewing tobacco off and on since 1970  Vaping Use  . Vaping Use: Never used  Substance and Sexual Activity  . Alcohol use: Never  . Drug use: No  . Sexual activity: Not on file  Other Topics Concern  . Not on file  Social History Narrative  . Not on file   Social Determinants of Health   Financial Resource Strain: Not on file  Food Insecurity: Not on file  Transportation Needs: Not on file  Physical Activity: Not on file  Stress: Not on file  Social Connections: Not on file    Vitals:   07/23/20 1018  BP: 130/70  Pulse: 72  Resp: 16  SpO2: 97%   Body mass index is 39.65 kg/m.  Physical Exam Vitals and nursing note reviewed.  Constitutional:      General: He is not in acute distress.    Appearance: He is well-developed.  HENT:     Head: Normocephalic and atraumatic.     Mouth/Throat:     Mouth: Mucous membranes are moist.  Eyes:     Conjunctiva/sclera: Conjunctivae normal.  Cardiovascular:     Rate and Rhythm: Normal rate and regular rhythm.     Pulses:          Dorsalis pedis pulses are 2+ on the right side and 2+ on the left side.     Heart sounds: No murmur heard.     Comments: Trace pitting edema LE,bilateral. Varicose veins LE,bilateral. Pulmonary:     Effort: Pulmonary effort is normal. No respiratory distress.     Breath sounds: Normal breath sounds.  Abdominal:     Palpations: Abdomen is soft. There is no hepatomegaly or mass.     Tenderness: There is no abdominal tenderness.  Lymphadenopathy:     Cervical: No cervical adenopathy.  Skin:    General: Skin is warm.     Findings: No erythema or rash.  Neurological:     Mental Status: He is alert and oriented to person, place, and time.     Cranial Nerves: No cranial nerve deficit.     Gait: Gait normal.  Psychiatric:     Comments: Well groomed, good eye contact.   ASSESSMENT AND PLAN:  Isaac Carpenter was seen today for insomnia.  Diagnoses and all orders for this visit:   Orders  Placed This Encounter  Procedures  . Basic metabolic panel  . Hepatic function panel  . Microalbumin / creatinine urine ratio  . Hepatitis C antibody  . Brain Natriuretic Peptide  . Ambulatory referral to Pulmonology  . Amb Referral to Nutrition and Diabetic Education  . POC HgB A1c   Lab Results  Component Value Date   HGBA1C 6.4 07/23/2020   Lab Results  Component Value Date   ALT 33 07/23/2020   AST 33 07/23/2020   ALKPHOS 55 07/23/2020   BILITOT 0.6 07/23/2020   Lab Results  Component Value Date   CREATININE 0.75  07/23/2020   BUN 21 07/23/2020   NA 142 07/23/2020   K 3.9 07/23/2020   CL 101 07/23/2020   CO2 34 (H) 07/23/2020   Lab Results  Component Value Date   MICROALBUR 1.0 07/23/2020    PND (paroxysmal nocturnal dyspnea) It seems like more related to OSA, waking up during episodes. Further recommendations according to BNP.  Type 2 diabetes mellitus with other specified complication ([place]) VFI4P is at goal. We discussed diagnostic criteria and possible complications of poorly controlled glucose. Non pharmacologic treatment recommended. Regular exercise and healthy diet with avoidance of added sugar food intake is an important part of treatment and encouraged. Nutrition education will be arranged. Annual eye exam, periodic dental and foot care recommended. F/U in 5-6 months   Hypothyroidism, postsurgical Problem is well controlled. Resume Synthroid 88 mcg daily. We will plan on rechecking TSH in 01/2021.  OSA (obstructive sleep apnea) We discussed diagnosis, possible complications, and treatment options. He agrees with having sleep study and trying CPAP, so pulmonology referral placed. Weight loss will help.  Hypertension, essential, benign BP adequately controlled. Continue lisinopril-HCTZ 20-25 mg daily. Low-salt diet also recommended.  Elevated transaminase level Mild. Possible etiologies discussed. Further recommendations according to LFT  results.  Encounter for HCV screening test for low risk patient -     Hepatitis C antibody   Spent 46 minutes.  During this time history was obtained and documented, examination was performed, prior labs reviewed, assessment/plan discussed, and documentation completed.  Return in about 6 months (around 01/22/2021) for DM II,wt,HTN,HLD,hypothyroidism.Marland Kitchen   Emoni Whitworth G. [place], MD  [facility]. [place] office.  A few things to remember from today's visit:   Type II diabetes mellitus with complication ([place]) - Plan: POC HgB A1c, Microalbumin / creatinine urine ratio, Amb Referral to Nutrition and Diabetic Education  Encounter for HCV screening test for low risk patient - Plan: Hepatitis C antibody  Hypertension, essential, benign - Plan: Basic metabolic panel  OSA (obstructive sleep apnea) - Plan: Ambulatory referral to Pulmonology  PND (paroxysmal nocturnal dyspnea) - Plan: Brain Natriuretic Peptide  Elevated transaminase level - Plan: Hepatic function panel  If you need refills please call your pharmacy. Do not use My Chart to request refills or for acute issues that need immediate attention.    Diabetes Mellitus and Nutrition, Adult When you have diabetes, or diabetes mellitus, it is very important to have healthy eating habits because your blood sugar (glucose) levels are greatly affected by what you eat and drink. Eating healthy foods in the right amounts, at about the same times every day, can help you:  Control your blood glucose.  Lower your risk of heart disease.  Improve your blood pressure.  Reach or maintain a healthy weight. What can affect my meal plan? Every person with diabetes is different, and each person has different needs for a meal plan. Your health care provider may recommend that you work with a dietitian to make a meal plan that is best for you. Your meal plan may vary depending on factors such as:  The calories you need.  The medicines you  take.  Your weight.  Your blood glucose, blood pressure, and cholesterol levels.  Your activity level.  Other health conditions you have, such as heart or kidney disease. How do carbohydrates affect me? Carbohydrates, also called carbs, affect your blood glucose level more than any other type of food. Eating carbs naturally raises the amount of glucose in your blood. Carb counting is  a method for keeping track of how many carbs you eat. Counting carbs is important to keep your blood glucose at a healthy level, especially if you use insulin or take certain oral diabetes medicines. It is important to know how many carbs you can safely have in each meal. This is different for every person. Your dietitian can help you calculate how many carbs you should have at each meal and for each snack. How does alcohol affect me? Alcohol can cause a sudden decrease in blood glucose (hypoglycemia), especially if you use insulin or take certain oral diabetes medicines. Hypoglycemia can be a life-threatening condition. Symptoms of hypoglycemia, such as sleepiness, dizziness, and confusion, are similar to symptoms of having too much alcohol.  Do not drink alcohol if: ? Your health care provider tells you not to drink. ? You are pregnant, may be pregnant, or are planning to become pregnant.  If you drink alcohol: ? Do not drink on an empty stomach. ? Limit how much you use to:  0-1 drink a day for women.  0-2 drinks a day for men. ? Be aware of how much alcohol is in your drink. In the [place] one drink equals one 12 oz bottle of beer (355 mL), one 5 oz glass of wine (148 mL), or one 1 oz glass of hard liquor (44 mL). ? Keep yourself hydrated with water, diet soda, or unsweetened iced tea.  Keep in mind that regular soda, juice, and other mixers may contain a lot of sugar and must be counted as carbs. What are tips for following this plan? Reading food labels  Start by checking the serving size on the  "Nutrition Facts" label of packaged foods and drinks. The amount of calories, carbs, fats, and other nutrients listed on the label is based on one serving of the item. Many items contain more than one serving per package.  Check the total grams (g) of carbs in one serving. You can calculate the number of servings of carbs in one serving by dividing the total carbs by 15. For example, if a food has 30 g of total carbs per serving, it would be equal to 2 servings of carbs.  Check the number of grams (g) of saturated fats and trans fats in one serving. Choose foods that have a low amount or none of these fats.  Check the number of milligrams (mg) of salt (sodium) in one serving. Most people should limit total sodium intake to less than 2,300 mg per day.  Always check the nutrition information of foods labeled as "low-fat" or "nonfat." These foods may be higher in added sugar or refined carbs and should be avoided.  Talk to your dietitian to identify your daily goals for nutrients listed on the label. Shopping  Avoid buying canned, pre-made, or processed foods. These foods tend to be high in fat, sodium, and added sugar.  Shop around the outside edge of the grocery store. This is where you will most often find fresh fruits and vegetables, bulk grains, fresh meats, and fresh dairy. Cooking  Use low-heat cooking methods, such as baking, instead of high-heat cooking methods like deep frying.  Cook using healthy oils, such as olive, canola, or sunflower oil.  Avoid cooking with butter, cream, or high-fat meats. Meal planning  Eat meals and snacks regularly, preferably at the same times every day. Avoid going long periods of time without eating.  Eat foods that are high in fiber, such as fresh fruits, vegetables, beans,  and whole grains. Talk with your dietitian about how many servings of carbs you can eat at each meal.  Eat 4-6 oz (112-168 g) of lean protein each day, such as lean meat, chicken,  fish, eggs, or tofu. One ounce (oz) of lean protein is equal to: ? 1 oz (28 g) of meat, chicken, or fish. ? 1 egg. ?  cup (62 g) of tofu.  Eat some foods each day that contain healthy fats, such as avocado, nuts, seeds, and fish.   What foods should I eat? Fruits Berries. Apples. Oranges. Peaches. Apricots. Plums. Grapes. Mango. Papaya. Pomegranate. Kiwi. Cherries. Vegetables Lettuce. Spinach. Leafy greens, including kale, chard, collard greens, and mustard greens. Beets. Cauliflower. Cabbage. Broccoli. Carrots. Green beans. Tomatoes. Peppers. Onions. Cucumbers. Brussels sprouts. Grains Whole grains, such as whole-wheat or whole-grain bread, crackers, tortillas, cereal, and pasta. Unsweetened oatmeal. Quinoa. Brown or wild rice. Meats and other proteins Seafood. Poultry without skin. Lean cuts of poultry and beef. Tofu. Nuts. Seeds. Dairy Low-fat or fat-free dairy products such as milk, yogurt, and cheese. The items listed above may not be a complete list of foods and beverages you can eat. Contact a dietitian for more information. What foods should I avoid? Fruits Fruits canned with syrup. Vegetables Canned vegetables. Frozen vegetables with butter or cream sauce. Grains Refined white flour and flour products such as bread, pasta, snack foods, and cereals. Avoid all processed foods. Meats and other proteins Fatty cuts of meat. Poultry with skin. Breaded or fried meats. Processed meat. Avoid saturated fats. Dairy Full-fat yogurt, cheese, or milk. Beverages Sweetened drinks, such as soda or iced tea. The items listed above may not be a complete list of foods and beverages you should avoid. Contact a dietitian for more information. Questions to ask a health care provider  Do I need to meet with a diabetes educator?  Do I need to meet with a dietitian?  What number can I call if I have questions?  When are the best times to check my blood glucose? Where to find more  information:  American Diabetes Association: diabetes.org  Academy of Nutrition and Dietetics: www.eatright.[employer] of Diabetes and Digestive and Kidney Diseases: [employer]  Association of Diabetes Care and Education Specialists: www.diabeteseducator.org Summary  It is important to have healthy eating habits because your blood sugar (glucose) levels are greatly affected by what you eat and drink.  A healthy meal plan will help you control your blood glucose and maintain a healthy lifestyle.  Your health care provider may recommend that you work with a dietitian to make a meal plan that is best for you.  Keep in mind that carbohydrates (carbs) and alcohol have immediate effects on your blood glucose levels. It is important to count carbs and to use alcohol carefully. This information is not intended to replace advice given to you by your health care provider. Make sure you discuss any questions you have with your health care provider. Document Revised: 03/14/2019 Document Reviewed: 03/14/2019 Elsevier Patient Education  2021 [place].  Please be sure medication list is accurate. If a new problem present, please set up appointment sooner than planned today.

## 2020-07-23 NOTE — Patient Instructions (Addendum)
A few things to remember from today's visit:   Type II diabetes mellitus with complication ([place]) - Plan: POC HgB A1c, Microalbumin / creatinine urine ratio, Amb Referral to Nutrition and Diabetic Education  Encounter for HCV screening test for low risk patient - Plan: Hepatitis C antibody  Hypertension, essential, benign - Plan: Basic metabolic panel  OSA (obstructive sleep apnea) - Plan: Ambulatory referral to Pulmonology  PND (paroxysmal nocturnal dyspnea) - Plan: Brain Natriuretic Peptide  Elevated transaminase level - Plan: Hepatic function panel  If you need refills please call your pharmacy. Do not use My Chart to request refills or for acute issues that need immediate attention.    Diabetes Mellitus and Nutrition, Adult When you have diabetes, or diabetes mellitus, it is very important to have healthy eating habits because your blood sugar (glucose) levels are greatly affected by what you eat and drink. Eating healthy foods in the right amounts, at about the same times every day, can help you:  Control your blood glucose.  Lower your risk of heart disease.  Improve your blood pressure.  Reach or maintain a healthy weight. What can affect my meal plan? Every person with diabetes is different, and each person has different needs for a meal plan. Your health care provider may recommend that you work with a dietitian to make a meal plan that is best for you. Your meal plan may vary depending on factors such as:  The calories you need.  The medicines you take.  Your weight.  Your blood glucose, blood pressure, and cholesterol levels.  Your activity level.  Other health conditions you have, such as heart or kidney disease. How do carbohydrates affect me? Carbohydrates, also called carbs, affect your blood glucose level more than any other type of food. Eating carbs naturally raises the amount of glucose in your blood. Carb counting is a method for keeping track of how  many carbs you eat. Counting carbs is important to keep your blood glucose at a healthy level, especially if you use insulin or take certain oral diabetes medicines. It is important to know how many carbs you can safely have in each meal. This is different for every person. Your dietitian can help you calculate how many carbs you should have at each meal and for each snack. How does alcohol affect me? Alcohol can cause a sudden decrease in blood glucose (hypoglycemia), especially if you use insulin or take certain oral diabetes medicines. Hypoglycemia can be a life-threatening condition. Symptoms of hypoglycemia, such as sleepiness, dizziness, and confusion, are similar to symptoms of having too much alcohol.  Do not drink alcohol if: ? Your health care provider tells you not to drink. ? You are pregnant, may be pregnant, or are planning to become pregnant.  If you drink alcohol: ? Do not drink on an empty stomach. ? Limit how much you use to:  0-1 drink a day for women.  0-2 drinks a day for men. ? Be aware of how much alcohol is in your drink. In the [place] one drink equals one 12 oz bottle of beer (355 mL), one 5 oz glass of wine (148 mL), or one 1 oz glass of hard liquor (44 mL). ? Keep yourself hydrated with water, diet soda, or unsweetened iced tea.  Keep in mind that regular soda, juice, and other mixers may contain a lot of sugar and must be counted as carbs. What are tips for following this plan? Reading food labels  Start by  checking the serving size on the "Nutrition Facts" label of packaged foods and drinks. The amount of calories, carbs, fats, and other nutrients listed on the label is based on one serving of the item. Many items contain more than one serving per package.  Check the total grams (g) of carbs in one serving. You can calculate the number of servings of carbs in one serving by dividing the total carbs by 15. For example, if a food has 30 g of total carbs per  serving, it would be equal to 2 servings of carbs.  Check the number of grams (g) of saturated fats and trans fats in one serving. Choose foods that have a low amount or none of these fats.  Check the number of milligrams (mg) of salt (sodium) in one serving. Most people should limit total sodium intake to less than 2,300 mg per day.  Always check the nutrition information of foods labeled as "low-fat" or "nonfat." These foods may be higher in added sugar or refined carbs and should be avoided.  Talk to your dietitian to identify your daily goals for nutrients listed on the label. Shopping  Avoid buying canned, pre-made, or processed foods. These foods tend to be high in fat, sodium, and added sugar.  Shop around the outside edge of the grocery store. This is where you will most often find fresh fruits and vegetables, bulk grains, fresh meats, and fresh dairy. Cooking  Use low-heat cooking methods, such as baking, instead of high-heat cooking methods like deep frying.  Cook using healthy oils, such as olive, canola, or sunflower oil.  Avoid cooking with butter, cream, or high-fat meats. Meal planning  Eat meals and snacks regularly, preferably at the same times every day. Avoid going long periods of time without eating.  Eat foods that are high in fiber, such as fresh fruits, vegetables, beans, and whole grains. Talk with your dietitian about how many servings of carbs you can eat at each meal.  Eat 4-6 oz (112-168 g) of lean protein each day, such as lean meat, chicken, fish, eggs, or tofu. One ounce (oz) of lean protein is equal to: ? 1 oz (28 g) of meat, chicken, or fish. ? 1 egg. ?  cup (62 g) of tofu.  Eat some foods each day that contain healthy fats, such as avocado, nuts, seeds, and fish.   What foods should I eat? Fruits Berries. Apples. Oranges. Peaches. Apricots. Plums. Grapes. Mango. Papaya. Pomegranate. Kiwi. Cherries. Vegetables Lettuce. Spinach. Leafy greens,  including kale, chard, collard greens, and mustard greens. Beets. Cauliflower. Cabbage. Broccoli. Carrots. Green beans. Tomatoes. Peppers. Onions. Cucumbers. Brussels sprouts. Grains Whole grains, such as whole-wheat or whole-grain bread, crackers, tortillas, cereal, and pasta. Unsweetened oatmeal. Quinoa. Brown or wild rice. Meats and other proteins Seafood. Poultry without skin. Lean cuts of poultry and beef. Tofu. Nuts. Seeds. Dairy Low-fat or fat-free dairy products such as milk, yogurt, and cheese. The items listed above may not be a complete list of foods and beverages you can eat. Contact a dietitian for more information. What foods should I avoid? Fruits Fruits canned with syrup. Vegetables Canned vegetables. Frozen vegetables with butter or cream sauce. Grains Refined white flour and flour products such as bread, pasta, snack foods, and cereals. Avoid all processed foods. Meats and other proteins Fatty cuts of meat. Poultry with skin. Breaded or fried meats. Processed meat. Avoid saturated fats. Dairy Full-fat yogurt, cheese, or milk. Beverages Sweetened drinks, such as soda or iced tea.  The items listed above may not be a complete list of foods and beverages you should avoid. Contact a dietitian for more information. Questions to ask a health care provider  Do I need to meet with a diabetes educator?  Do I need to meet with a dietitian?  What number can I call if I have questions?  When are the best times to check my blood glucose? Where to find more information:  American Diabetes Association: diabetes.org  Academy of Nutrition and Dietetics: www.eatright.[employer] of Diabetes and Digestive and Kidney Diseases: [employer]  Association of Diabetes Care and Education Specialists: www.diabeteseducator.org Summary  It is important to have healthy eating habits because your blood sugar (glucose) levels are greatly affected by what you eat and  drink.  A healthy meal plan will help you control your blood glucose and maintain a healthy lifestyle.  Your health care provider may recommend that you work with a dietitian to make a meal plan that is best for you.  Keep in mind that carbohydrates (carbs) and alcohol have immediate effects on your blood glucose levels. It is important to count carbs and to use alcohol carefully. This information is not intended to replace advice given to you by your health care provider. Make sure you discuss any questions you have with your health care provider. Document Revised: 03/14/2019 Document Reviewed: 03/14/2019 Elsevier Patient Education  2021 [place].  Please be sure medication list is accurate. If a new problem present, please set up appointment sooner than planned today.

## 2020-07-23 NOTE — Assessment & Plan Note (Addendum)
HgA1C is at goal. We discussed diagnostic criteria and possible complications of poorly controlled glucose. Non pharmacologic treatment recommended. Regular exercise and healthy diet with avoidance of added sugar food intake is an important part of treatment and encouraged. Nutrition education will be arranged. Annual eye exam, periodic dental and foot care recommended. F/U in 5-6 months

## 2020-07-23 NOTE — Assessment & Plan Note (Signed)
Problem is well controlled. Resume Synthroid 88 mcg daily. We will plan on rechecking TSH in 01/2021.

## 2020-07-23 NOTE — Assessment & Plan Note (Signed)
We discussed diagnosis, possible complications, and treatment options. He agrees with having sleep study and trying CPAP, so pulmonology referral placed. Weight loss will help.

## 2020-07-23 NOTE — Assessment & Plan Note (Signed)
BP adequately controlled. Continue lisinopril-HCTZ 20-25 mg daily. Low-salt diet also recommended.

## 2020-07-24 LAB — HEPATITIS C ANTIBODY
Hepatitis C Ab: NONREACTIVE
SIGNAL TO CUT-OFF: 0.02 (ref <1.00)

## 2020-07-30 ENCOUNTER — Institutional Professional Consult (permissible substitution): Payer: Medicare PPO | Admitting: Internal Medicine

## 2020-08-06 NOTE — Progress Notes (Deleted)
08/07/20- 70 yoM never smoker for sleep evaluation courtesy of Dr Betty Martinique with concern of OSA. Medical problem list includes- HTN, DM2, Surgical Hypothyroid, BPH,  Epworth score- Body weight today- Covid vax- Flu vax-

## 2020-08-08 ENCOUNTER — Institutional Professional Consult (permissible substitution): Payer: Medicare PPO | Admitting: Internal Medicine

## 2020-08-14 ENCOUNTER — Institutional Professional Consult (permissible substitution): Payer: Medicare PPO | Admitting: Internal Medicine

## 2020-08-29 DIAGNOSIS — N481 Balanitis: Secondary | ICD-10-CM | POA: Diagnosis not present

## 2020-08-29 DIAGNOSIS — N4883 Acquired buried penis: Secondary | ICD-10-CM | POA: Diagnosis not present

## 2020-09-20 ENCOUNTER — Other Ambulatory Visit: Payer: Self-pay

## 2020-09-20 ENCOUNTER — Ambulatory Visit (INDEPENDENT_AMBULATORY_CARE_PROVIDER_SITE_OTHER): Payer: Medicare PPO | Admitting: Pulmonary Disease

## 2020-09-20 ENCOUNTER — Encounter: Payer: Self-pay | Admitting: Pulmonary Disease

## 2020-09-20 VITALS — BP 124/80 | HR 57 | Temp 98.0°F | Ht 73.0 in | Wt 296.0 lb

## 2020-09-20 DIAGNOSIS — G4733 Obstructive sleep apnea (adult) (pediatric): Secondary | ICD-10-CM | POA: Diagnosis not present

## 2020-09-20 DIAGNOSIS — I10 Essential (primary) hypertension: Secondary | ICD-10-CM

## 2020-09-20 NOTE — Progress Notes (Signed)
Subjective:    Patient ID: Isaac Carpenter, male    DOB: Mar 29, 1951, 70 y.o.   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 Epworth sleepiness score is 7. He reports gasping for air while sleeping which wakes him up.  He describes episodes around 4 AM in the morning where he feels like he has difficulty breathing and suffocating, he gets out of bed and gets into a recliner and then seems to sleep well for the next 2 to 3 hours.  He underwent some type of sleep evaluation 5 years ago but was not convinced that he had a real problem but now has agreed to seek evaluation.  Bedtime is between 10 and 11 PM, he sleeps generally on his right side but turns over to his left side he rarely sleeps on his back, uses 1 pillow.  He reports 1 nocturnal awakening due to nocturia, frequently wakes up every couple of hours and is out of bed by 7 AM feeling rested without dryness of mouth or headaches.  He will take occasional naps. There is no history suggestive of cataplexy, sleep paralysis or parasomnias  He leads an active lifestyle.  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 He is a tobacco chewer, never smoked  PMH -hypertension on 2 medications, hypothyroid, always runs a low heart rate   Past Medical History:  Diagnosis Date  . Arthritis   . History of partial thyroidectomy STATES OVERACTIVE THYROID-- NO ISSUES SINCE AGE 35 AND NO MEDS  . Hyperlipidemia   . Hypertension   . Thyroid disease    Past Surgical History:  Procedure Laterality Date  . CIRCUMCISION  01/04/2012   Procedure: CIRCUMCISION ADULT;  Surgeon: Ailene Rud, MD;  Location: [facility];  Service: Urology;  Laterality: N/A;  . COLONOSCOPY  04/08/2020   Vito Cirigliano  . THYROIDECTOMY, PARTIAL  AGE 35   OVERACTIVE THYROID     Allergies  Allergen Reactions  . Testosterone Other (See Comments)   Social History   Socioeconomic History  . Marital status: Married    Spouse name: Not on file  . Number of children: Not on file  . Years of education: Not on file  . Highest education level: Not on file  Occupational History  . Not on file  Tobacco Use  . Smoking status: Never Smoker  . Smokeless tobacco: Current User    Types: Chew  . Tobacco comment: started chewing tobacco off and on since 1970  Vaping Use  . Vaping Use: Never used  Substance and Sexual Activity  . Alcohol use: Never  . Drug use: No  . Sexual activity: Not on file  Other Topics Concern  . Not on file  Social History Narrative  . Not on file   Social Determinants of Health   Financial Resource Strain: Not on file  Food Insecurity: Not on file  Transportation Needs: Not on file  Physical Activity: Not on file  Stress: Not on file  Social Connections: Not on file  Intimate Partner Violence: Not on file    Family History  Problem Relation Age of Onset  . Cancer Mother   . COPD Father   . Heart attack Father   . Cancer Sister   . Cancer Brother   . Cancer Sister   . Cancer Brother   .  Colon cancer Neg Hx   . Colon polyps Neg Hx   . Esophageal cancer Neg Hx   . Rectal cancer Neg Hx   . Stomach cancer Neg Hx       Review of Systems Constitutional: negative for anorexia, fevers and sweats  Eyes: negative for irritation, redness and visual disturbance  Ears, nose, mouth, throat, and face: negative for earaches, epistaxis, nasal congestion and sore throat  Respiratory: negative for cough, dyspnea on exertion, sputum and wheezing  Cardiovascular: negative for chest pain, dyspnea, lower extremity edema, orthopnea, palpitations and syncope  Gastrointestinal: negative for abdominal pain, constipation, diarrhea, melena, nausea and vomiting  Genitourinary:negative for dysuria, frequency and hematuria   Hematologic/lymphatic: negative for bleeding, easy bruising and lymphadenopathy  Musculoskeletal:negative for arthralgias, muscle weakness and stiff joints  Neurological: negative for coordination problems, gait problems, headaches and weakness  Endocrine: negative for diabetic symptoms including polydipsia, polyuria and weight loss     Objective:   Physical Exam  Gen. Pleasant, obese, in no distress, normal affect ENT - no pallor,icterus, no post nasal drip, class 2 airway, long uvula, ectropion Rt lower eyelid Neck: No JVD, no thyromegaly, no carotid bruits Lungs: no use of accessory muscles, no dullness to percussion, decreased without rales or rhonchi  Cardiovascular: Rhythm regular, heart sounds  normal, no murmurs or gallops, no peripheral edema Abdomen: soft and non-tender, no hepatosplenomegaly, BS normal. Musculoskeletal: No deformities, no cyanosis or clubbing Neuro:  alert, non focal, no tremors       Assessment & Plan:

## 2020-09-20 NOTE — Patient Instructions (Signed)
Home sleep study 

## 2020-09-20 NOTE — Assessment & Plan Note (Signed)
Given  narrow pharyngeal exam, witnessed apneas & loud snoring, obstructive sleep apnea is very likely & an overnight polysomnogram will be scheduled as a home study. The pathophysiology of obstructive sleep apnea , it's cardiovascular consequences & modes of treatment including CPAP were discused with the patient in detail & they evidenced understanding.  his description of early morning awakenings are classic for REM related OSA .  He would need a formal titration study if he has significant OSA.  He is willing to use CPAP if necessary

## 2020-09-20 NOTE — Assessment & Plan Note (Signed)
Appears controlled on 2 medications.  He does have long history of bradycardia

## 2020-09-30 ENCOUNTER — Other Ambulatory Visit: Payer: Self-pay

## 2020-09-30 ENCOUNTER — Ambulatory Visit (INDEPENDENT_AMBULATORY_CARE_PROVIDER_SITE_OTHER): Payer: Medicare PPO

## 2020-09-30 DIAGNOSIS — Z0000 Encounter for general adult medical examination without abnormal findings: Secondary | ICD-10-CM | POA: Diagnosis not present

## 2020-09-30 NOTE — Progress Notes (Addendum)
Subjective:   Isaac Carpenter is a 70 y.o. male who presents for an Initial Medicare Annual Wellness Visit.   I connected with Isaac Carpenter today by telephone and verified that I am speaking with the correct person using two identifiers. Location patient: home Location provider: work Persons participating in the virtual visit: patient, provider.   I discussed the limitations, risks, security and privacy concerns of performing an evaluation and management service by telephone and the availability of in person appointments. I also discussed with the patient that there may be a patient responsible charge related to this service. The patient expressed understanding and verbally consented to this telephonic visit.    Interactive audio and video telecommunications were attempted between this provider and patient, however failed, due to patient having technical difficulties OR patient did not have access to video capability.  We continued and completed visit with audio only.     Review of Systems    N/a       Objective:    There were no vitals filed for this visit. There is no height or weight on file to calculate BMI.  No flowsheet data found.  Current Medications (verified) Outpatient Encounter Medications as of 09/30/2020  Medication Sig   aspirin EC 81 MG tablet Take 81 mg by mouth daily. Swallow whole.   atorvastatin (LIPITOR) 20 MG tablet Take 1 tablet (20 mg total) by mouth daily.   levothyroxine (SYNTHROID) 88 MCG tablet Take 1 tablet (88 mcg total) by mouth daily.   lisinopril-hydrochlorothiazide (ZESTORETIC) 20-25 MG tablet Take 1 tablet by mouth daily.   tamsulosin (FLOMAX) 0.4 MG CAPS capsule Take 1 capsule by mouth daily.   No facility-administered encounter medications on file as of 09/30/2020.    Allergies (verified) Testosterone   History: Past Medical History:  Diagnosis Date   Arthritis    History of partial thyroidectomy STATES OVERACTIVE THYROID-- NO ISSUES  SINCE AGE 34 AND NO MEDS   Hyperlipidemia    Hypertension    Thyroid disease    Past Surgical History:  Procedure Laterality Date   CIRCUMCISION  01/04/2012   Procedure: CIRCUMCISION ADULT;  Surgeon: Ailene Rud, MD;  Location: [facility];  Service: Urology;  Laterality: N/A;   COLONOSCOPY  04/08/2020   Vito Cirigliano   THYROIDECTOMY, PARTIAL  AGE 20   OVERACTIVE THYROID   Family History  Problem Relation Age of Onset   Cancer Mother    COPD Father    Heart attack Father    Cancer Sister    Cancer Brother    Cancer Sister    Cancer Brother    Colon cancer Neg Hx    Colon polyps Neg Hx    Esophageal cancer Neg Hx    Rectal cancer Neg Hx    Stomach cancer Neg Hx    Social History   Socioeconomic History   Marital status: Married    Spouse name: Not on file   Number of children: Not on file   Years of education: Not on file   Highest education level: Not on file  Occupational History   Not on file  Tobacco Use   Smoking status: Never   Smokeless tobacco: Current    Types: Chew   Tobacco comments:    started chewing tobacco off and on since 1970  Vaping Use   Vaping Use: Never used  Substance and Sexual Activity   Alcohol use: Never   Drug use: No   Sexual activity:  Not on file  Other Topics Concern   Not on file  Social History Narrative   Not on file   Social Determinants of Health   Financial Resource Strain: Not on file  Food Insecurity: Not on file  Transportation Needs: Not on file  Physical Activity: Not on file  Stress: Not on file  Social Connections: Not on file    Tobacco Counseling Ready to quit: Not Answered Counseling given: Not Answered Tobacco comments: started chewing tobacco off and on since 1970   Clinical Intake:                 Diabetic?no         Activities of Daily Living In your present state of health, do you have any difficulty performing the following activities: 01/31/2020   Hearing? N  Vision? N  Difficulty concentrating or making decisions? N  Walking or climbing stairs? N  Dressing or bathing? N  Doing errands, shopping? N  Some recent data might be hidden    Patient Care Team: Martinique, [name], MD as PCP - General (Family Medicine)  Indicate any recent Medical Services you may have received from other than [facility] providers in the past year (date may be approximate).     Assessment:   This is a routine wellness examination for Isaac Carpenter.  Hearing/Vision screen No results found.  Dietary issues and exercise activities discussed:     Goals Addressed   None    Depression Screen PHQ 2/9 Scores 07/23/2020  PHQ - 2 Score 0    Fall Risk No flowsheet data found.  FALL RISK PREVENTION PERTAINING TO THE HOME:  Any stairs in or around the home? No  If so, are there any without handrails? No  Home free of loose throw rugs in walkways, pet beds, electrical cords, etc? Yes  Adequate lighting in your home to reduce risk of falls? Yes   ASSISTIVE DEVICES UTILIZED TO PREVENT FALLS:  Life alert? No  Use of a cane, walker or w/c? No  Grab bars in the bathroom? No  Shower chair or bench in shower? No  Elevated toilet seat or a handicapped toilet? Yes   Cognitive Function:  Normal cognitive status assessed by direct observation by this Nurse Health Advisor. No abnormalities found.        Immunizations Immunization History  Administered Date(s) Administered   Fluad Quad(high Dose 65+) 01/31/2020   Influenza Split 04/29/2009, 04/04/2012, 03/12/2014   Influenza,inj,Quad PF,6+ Mos 05/12/2016   PFIZER(Purple Top)SARS-COV-2 Vaccination 06/12/2019, 07/03/2019   Pneumococcal Conjugate-13 11/09/2016   Pneumococcal Polysaccharide-23 11/23/2017   Tdap 07/07/2011    TDAP status: Up to date  Flu Vaccine status: Up to date  Pneumococcal vaccine status: Up to date  Covid-19 vaccine status: Completed vaccines  Qualifies for Shingles Vaccine? Yes    Zostavax completed No   Shingrix Completed?: No.    Education has been provided regarding the importance of this vaccine. Patient has been advised to call insurance company to determine out of pocket expense if they have not yet received this vaccine. Advised may also receive vaccine at local pharmacy or Health Dept. Verbalized acceptance and understanding.  Screening Tests Health Maintenance  Topic Date Due   FOOT EXAM  Never done   OPHTHALMOLOGY EXAM  Never done   Zoster Vaccines- Shingrix (1 of 2) Never done   COVID-19 Vaccine (3 - Booster for Pfizer series) 12/03/2019   INFLUENZA VACCINE  11/18/2020   HEMOGLOBIN A1C  01/22/2021  TETANUS/TDAP  07/06/2021   COLONOSCOPY (Pts 45-2yrs Insurance coverage will need to be confirmed)  04/08/2025   Hepatitis C Screening  Completed   PNA vac Low Risk Adult  Completed   HPV VACCINES  Aged Out    Health Maintenance  Health Maintenance Due  Topic Date Due   FOOT EXAM  Never done   OPHTHALMOLOGY EXAM  Never done   Zoster Vaccines- Shingrix (1 of 2) Never done   COVID-19 Vaccine (3 - Booster for Pfizer series) 12/03/2019    Colorectal cancer screening: Type of screening: Colonoscopy. Completed 04/08/2020. Repeat every 5 years  Lung Cancer Screening: (Low Dose CT Chest recommended if Age 74-80 years, 30 pack-year currently smoking OR have quit w/in 15years.) does not qualify.   Lung Cancer Screening Referral: n/a   Additional Screening:  Hepatitis C Screening: does qualify; Completed 07/23/2020  Vision Screening: Recommended annual ophthalmology exams for early detection of glaucoma and other disorders of the eye. Is the patient up to date with their annual eye exam?  Yes  Who is the provider or what is the name of the office in which the patient attends annual eye exams? Pt will make appointment  If pt is not established with a provider, would they like to be referred to a provider to establish care? No .   Dental Screening:  Recommended annual dental exams for proper oral hygiene  Community Resource Referral / Chronic Care Management: CRR required this visit?  No   CCM required this visit?  No      Plan:     I have personally reviewed and noted the following in the patient's chart:   Medical and social history Use of alcohol, tobacco or illicit drugs  Current medications and supplements including opioid prescriptions. Patient is not currently taking opioid prescriptions. Functional ability and status Nutritional status Physical activity Advanced directives List of other physicians Hospitalizations, surgeries, and ER visits in previous 12 months Vitals Screenings to include cognitive, depression, and falls Referrals and appointments  In addition, I have reviewed and discussed with patient certain preventive protocols, quality metrics, and best practice recommendations. A written personalized care plan for preventive services as well as general preventive health recommendations were provided to patient.     Randel Pigg, LPN   7/35/3299   Nurse Notes: none

## 2020-09-30 NOTE — Patient Instructions (Signed)
Isaac Carpenter , Thank you for taking time to come for your Medicare Wellness Visit. I appreciate your ongoing commitment to your health goals. Please review the following plan we discussed and let me know if I can assist you in the future.   Screening recommendations/referrals: Colonoscopy: 04/08/2020  due 2026 Recommended yearly ophthalmology/optometry visit for glaucoma screening and checkup Recommended yearly dental visit for hygiene and checkup  Vaccinations: Influenza vaccine: due in fall 2022 Pneumococcal vaccine: completed series  Tdap vaccine: 07/07/2011 due 2023 Shingles vaccine: declines   Advanced directives: will provide copies   Conditions/risks identified: none   Next appointment: none   Preventive Care 69 Years and Older, Male Preventive care refers to lifestyle choices and visits with your health care provider that can promote health and wellness. What does preventive care include? A yearly physical exam. This is also called an annual well check. Dental exams once or twice a year. Routine eye exams. Ask your health care provider how often you should have your eyes checked. Personal lifestyle choices, including: Daily care of your teeth and gums. Regular physical activity. Eating a healthy diet. Avoiding tobacco and drug use. Limiting alcohol use. Practicing safe sex. Taking low doses of aspirin every day. Taking vitamin and mineral supplements as recommended by your health care provider. What happens during an annual well check? The services and screenings done by your health care provider during your annual well check will depend on your age, overall health, lifestyle risk factors, and family history of disease. Counseling  Your health care provider may ask you questions about your: Alcohol use. Tobacco use. Drug use. Emotional well-being. Home and relationship well-being. Sexual activity. Eating habits. History of falls. Memory and ability to understand  (cognition). Work and work Statistician. Screening  You may have the following tests or measurements: Height, weight, and BMI. Blood pressure. Lipid and cholesterol levels. These may be checked every 5 years, or more frequently if you are over 42 years old. Skin check. Lung cancer screening. You may have this screening every year starting at age 49 if you have a 30-pack-year history of smoking and currently smoke or have quit within the past 15 years. Fecal occult blood test (FOBT) of the stool. You may have this test every year starting at age 35. Flexible sigmoidoscopy or colonoscopy. You may have a sigmoidoscopy every 5 years or a colonoscopy every 10 years starting at age 44. Prostate cancer screening. Recommendations will vary depending on your family history and other risks. Hepatitis C blood test. Hepatitis B blood test. Sexually transmitted disease (STD) testing. Diabetes screening. This is done by checking your blood sugar (glucose) after you have not eaten for a while (fasting). You may have this done every 1-3 years. Abdominal aortic aneurysm (AAA) screening. You may need this if you are a current or former smoker. Osteoporosis. You may be screened starting at age 52 if you are at high risk. Talk with your health care provider about your test results, treatment options, and if necessary, the need for more tests. Vaccines  Your health care provider may recommend certain vaccines, such as: Influenza vaccine. This is recommended every year. Tetanus, diphtheria, and acellular pertussis (Tdap, Td) vaccine. You may need a Td booster every 10 years. Zoster vaccine. You may need this after age 37. Pneumococcal 13-valent conjugate (PCV13) vaccine. One dose is recommended after age 13. Pneumococcal polysaccharide (PPSV23) vaccine. One dose is recommended after age 81. Talk to your health care provider about which screenings and  vaccines you need and how often you need them. This  information is not intended to replace advice given to you by your health care provider. Make sure you discuss any questions you have with your health care provider. Document Released: 05/03/2015 Document Revised: 12/25/2015 Document Reviewed: 02/05/2015 Elsevier Interactive Patient Education  2017 [employer] Prevention in the Home Falls can cause injuries. They can happen to people of all ages. There are many things you can do to make your home safe and to help prevent falls. What can I do on the outside of my home? Regularly fix the edges of walkways and driveways and fix any cracks. Remove anything that might make you trip as you walk through a door, such as a raised step or threshold. Trim any bushes or trees on the path to your home. Use bright outdoor lighting. Clear any walking paths of anything that might make someone trip, such as rocks or tools. Regularly check to see if handrails are loose or broken. Make sure that both sides of any steps have handrails. Any raised decks and porches should have guardrails on the edges. Have any leaves, snow, or ice cleared regularly. Use sand or salt on walking paths during winter. Clean up any spills in your garage right away. This includes oil or grease spills. What can I do in the bathroom? Use night lights. Install grab bars by the toilet and in the tub and shower. Do not use towel bars as grab bars. Use non-skid mats or decals in the tub or shower. If you need to sit down in the shower, use a plastic, non-slip stool. Keep the floor dry. Clean up any water that spills on the floor as soon as it happens. Remove soap buildup in the tub or shower regularly. Attach bath mats securely with double-sided non-slip rug tape. Do not have throw rugs and other things on the floor that can make you trip. What can I do in the bedroom? Use night lights. Make sure that you have a light by your bed that is easy to reach. Do not use any sheets or  blankets that are too big for your bed. They should not hang down onto the floor. Have a firm chair that has side arms. You can use this for support while you get dressed. Do not have throw rugs and other things on the floor that can make you trip. What can I do in the kitchen? Clean up any spills right away. Avoid walking on wet floors. Keep items that you use a lot in easy-to-reach places. If you need to reach something above you, use a strong step stool that has a grab bar. Keep electrical cords out of the way. Do not use floor polish or wax that makes floors slippery. If you must use wax, use non-skid floor wax. Do not have throw rugs and other things on the floor that can make you trip. What can I do with my stairs? Do not leave any items on the stairs. Make sure that there are handrails on both sides of the stairs and use them. Fix handrails that are broken or loose. Make sure that handrails are as long as the stairways. Check any carpeting to make sure that it is firmly attached to the stairs. Fix any carpet that is loose or worn. Avoid having throw rugs at the top or bottom of the stairs. If you do have throw rugs, attach them to the floor with carpet tape. Make  sure that you have a light switch at the top of the stairs and the bottom of the stairs. If you do not have them, ask someone to add them for you. What else can I do to help prevent falls? Wear shoes that: Do not have high heels. Have rubber bottoms. Are comfortable and fit you well. Are closed at the toe. Do not wear sandals. If you use a stepladder: Make sure that it is fully opened. Do not climb a closed stepladder. Make sure that both sides of the stepladder are locked into place. Ask someone to hold it for you, if possible. Clearly mark and make sure that you can see: Any grab bars or handrails. First and last steps. Where the edge of each step is. Use tools that help you move around (mobility aids) if they are  needed. These include: Canes. Walkers. Scooters. Crutches. Turn on the lights when you go into a dark area. Replace any light bulbs as soon as they burn out. Set up your furniture so you have a clear path. Avoid moving your furniture around. If any of your floors are uneven, fix them. If there are any pets around you, be aware of where they are. Review your medicines with your doctor. Some medicines can make you feel dizzy. This can increase your chance of falling. Ask your doctor what other things that you can do to help prevent falls. This information is not intended to replace advice given to you by your health care provider. Make sure you discuss any questions you have with your health care provider. Document Released: 01/31/2009 Document Revised: 09/12/2015 Document Reviewed: 05/11/2014 Elsevier Interactive Patient Education  2017 [employer].

## 2020-10-14 ENCOUNTER — Other Ambulatory Visit: Payer: Self-pay | Admitting: Family Medicine

## 2020-10-23 DIAGNOSIS — R972 Elevated prostate specific antigen [PSA]: Secondary | ICD-10-CM | POA: Diagnosis not present

## 2020-10-30 DIAGNOSIS — N401 Enlarged prostate with lower urinary tract symptoms: Secondary | ICD-10-CM | POA: Diagnosis not present

## 2020-10-30 DIAGNOSIS — R351 Nocturia: Secondary | ICD-10-CM | POA: Diagnosis not present

## 2020-10-30 DIAGNOSIS — N481 Balanitis: Secondary | ICD-10-CM | POA: Diagnosis not present

## 2020-10-30 DIAGNOSIS — R972 Elevated prostate specific antigen [PSA]: Secondary | ICD-10-CM | POA: Diagnosis not present

## 2020-11-05 ENCOUNTER — Encounter: Payer: Self-pay | Admitting: Pulmonary Disease

## 2021-04-07 DIAGNOSIS — G8929 Other chronic pain: Secondary | ICD-10-CM | POA: Diagnosis not present

## 2021-04-07 DIAGNOSIS — M79674 Pain in right toe(s): Secondary | ICD-10-CM | POA: Diagnosis not present

## 2021-04-07 DIAGNOSIS — L84 Corns and callosities: Secondary | ICD-10-CM | POA: Diagnosis not present

## 2021-04-09 ENCOUNTER — Other Ambulatory Visit: Payer: Self-pay | Admitting: Family Medicine

## 2021-04-21 ENCOUNTER — Other Ambulatory Visit: Payer: Self-pay | Admitting: Family Medicine

## 2021-04-21 DIAGNOSIS — E890 Postprocedural hypothyroidism: Secondary | ICD-10-CM

## 2021-04-25 ENCOUNTER — Telehealth: Payer: Self-pay

## 2021-04-25 NOTE — Telephone Encounter (Signed)
Caller states she is calling for her husband. He has COVID19 and she wants to know what to do for him. His sx are cough, congestion, and weakness. Caller states she tested positive for Covid on Thursday and spouse got sick a couple days later, he has fever, cough, congestion, and tired.  04/25/2021 11:11:32 AM Call PCP within [place], RN, Erasmo Downer  04/25/21 1206: non productive cough, congestion, fatigue. Has been taking Mucinex. Denies SOB or fever. Wife states symptoms started 04/20/21; husband never tested for covid, but wife was positive 3 days prior to that. Assuming pt had covid, he is outside the window for antiviral. This RN confirmed that pt is taking Mucinex WITHOUT decongestant; appears to be effective. Pt advised to continue to stay hydrated, isolation guidelines reviewed, & instructed to schedule in person visit with provider if symptoms worsen or pt develops SOB. Wife able to repeat back instructions & verb understanding.

## 2021-04-28 ENCOUNTER — Other Ambulatory Visit: Payer: Self-pay | Admitting: Family Medicine

## 2021-04-28 DIAGNOSIS — E890 Postprocedural hypothyroidism: Secondary | ICD-10-CM

## 2021-05-06 IMAGING — CT CT ABD-PELV W/O CM
1 of 2 series · 14 of 32 positions shown, 19 images · non-contrast
Comparison: None.

CLINICAL DATA: Left-sided flank pain

EXAM:
CT ABDOMEN AND PELVIS WITHOUT CONTRAST
TECHNIQUE: Multidetector CT imaging of the abdomen and pelvis was performed
following the standard protocol without IV contrast.

[Series 2: renal standard/full · axial · 0.90mm/px · z∈[-410,-10]mm · 14 of 90 slices shown, 19 images]
[im 5/90  soft-tissue]
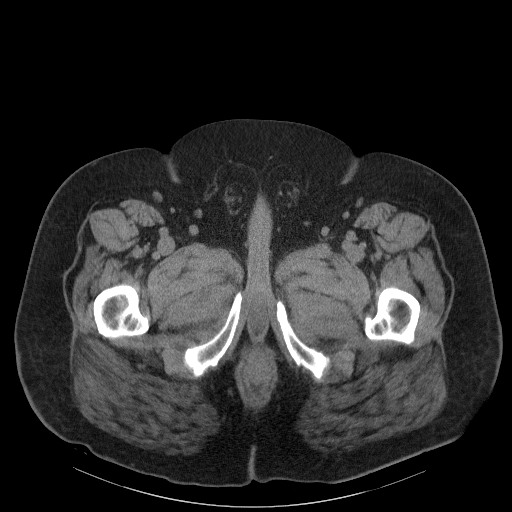
[im 5/90  bone]
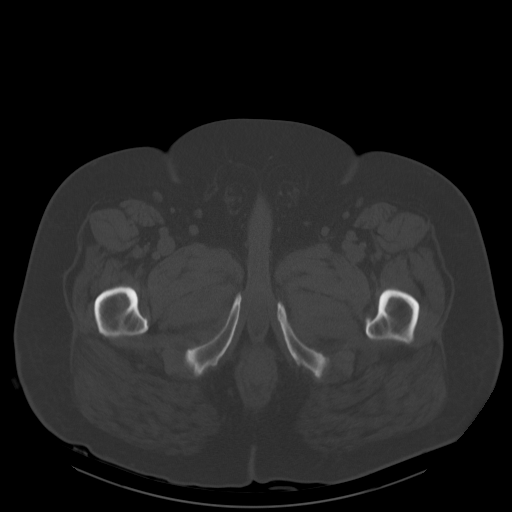
[im 15/90  soft-tissue]
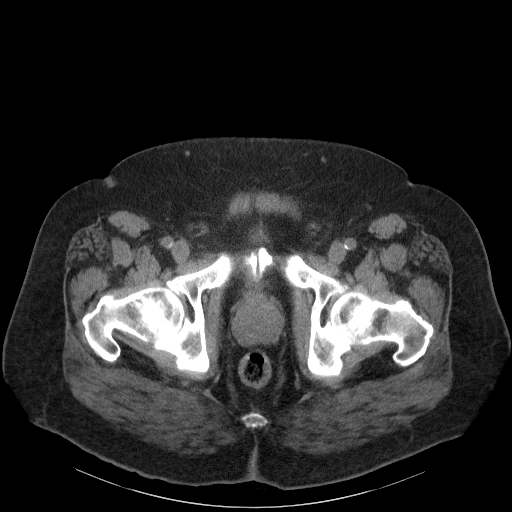
[im 19/90  soft-tissue]
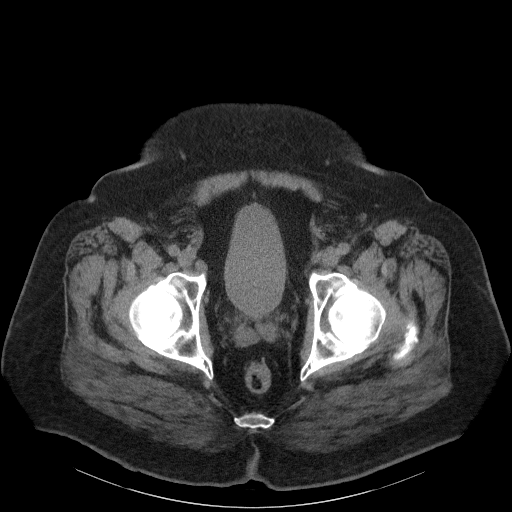
[im 24/90  soft-tissue]
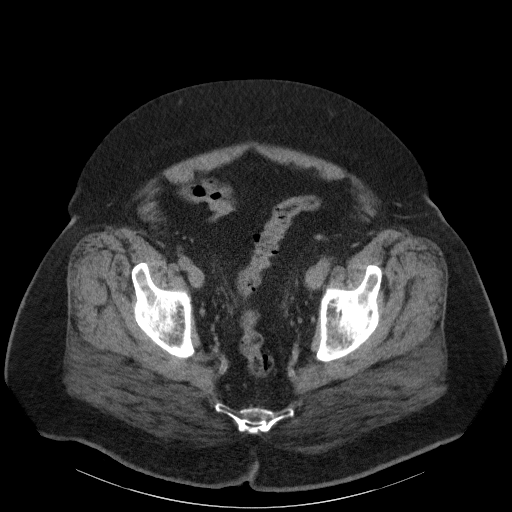
[im 33/90  soft-tissue]
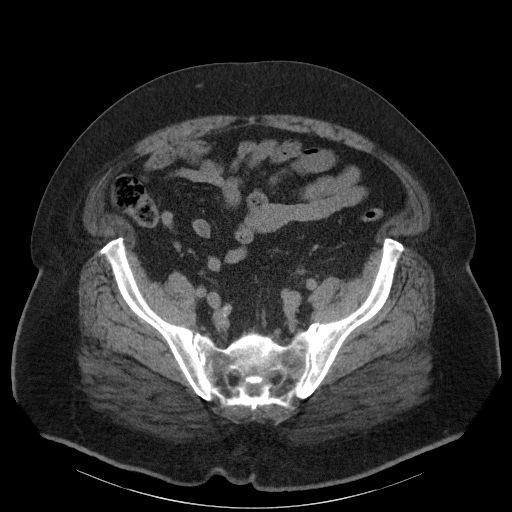
[im 38/90  soft-tissue]
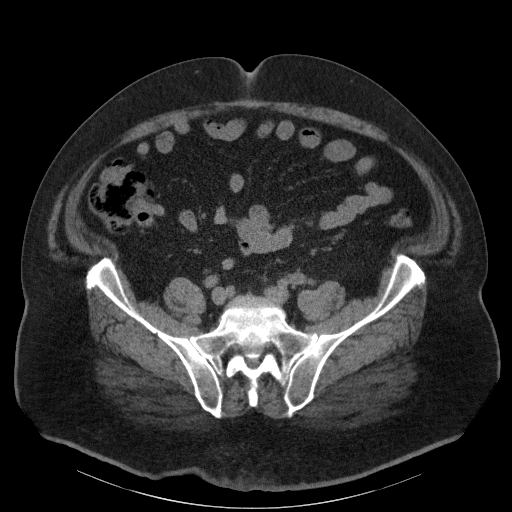
[im 47/90  soft-tissue]
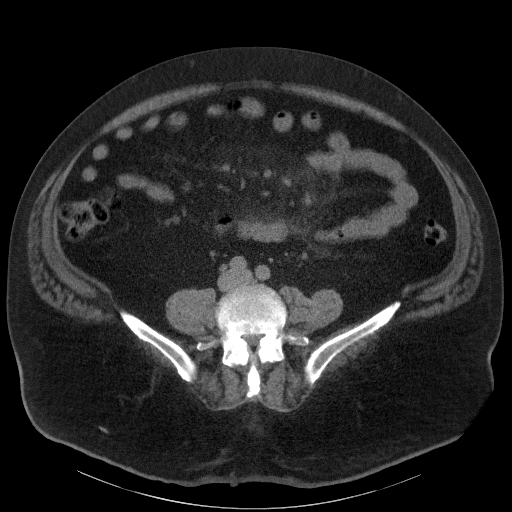
[im 52/90  soft-tissue]
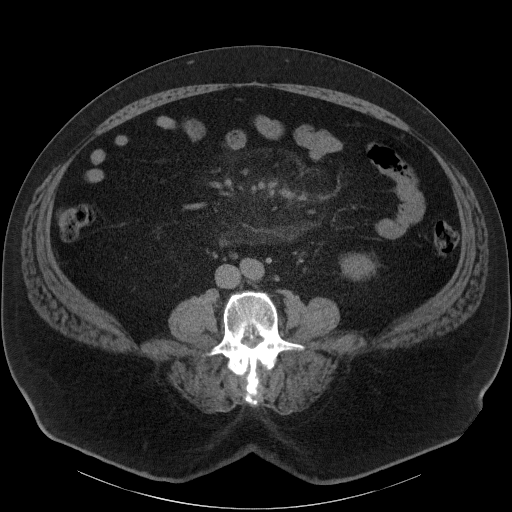
[im 57/90  soft-tissue]
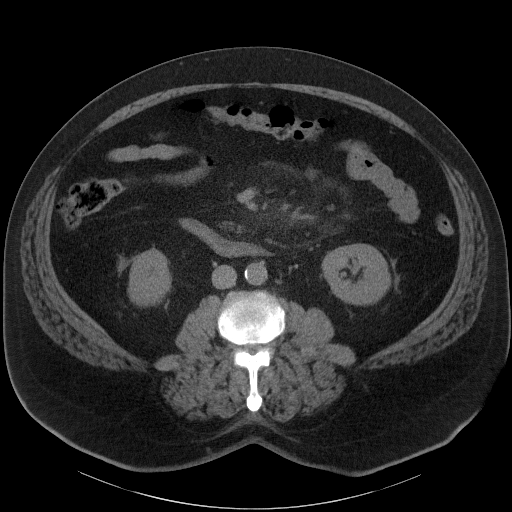
[im 57/90  bone]
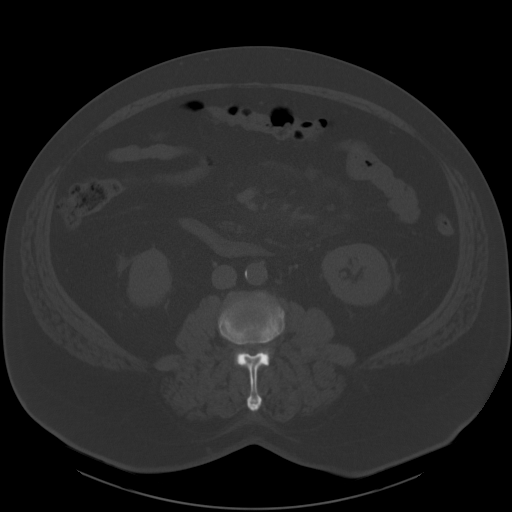
[im 66/90  soft-tissue]
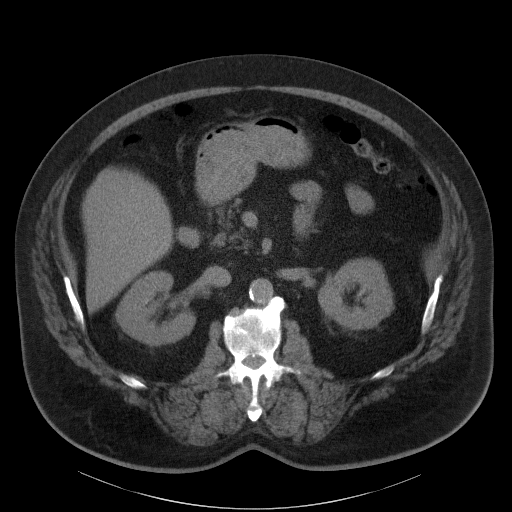
[im 71/90  soft-tissue]
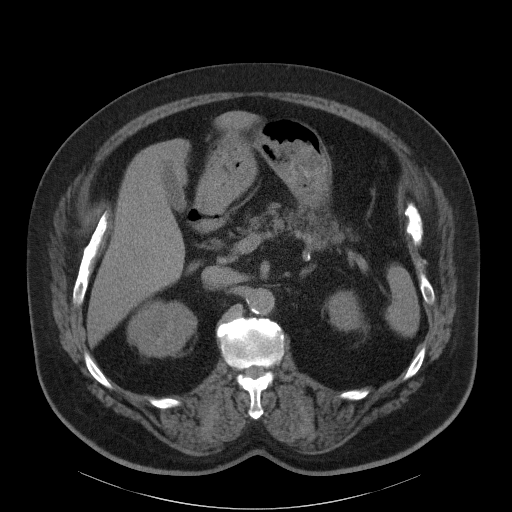
[im 71/90  lung]
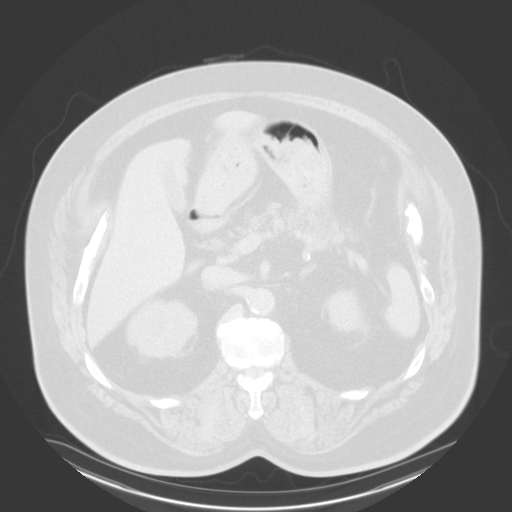
[im 75/90  soft-tissue]
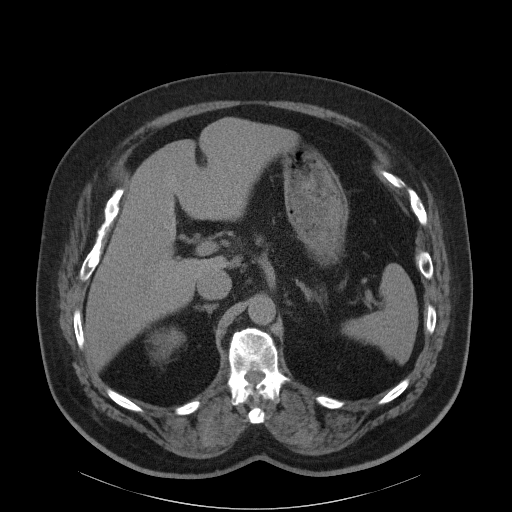
[im 75/90  lung]
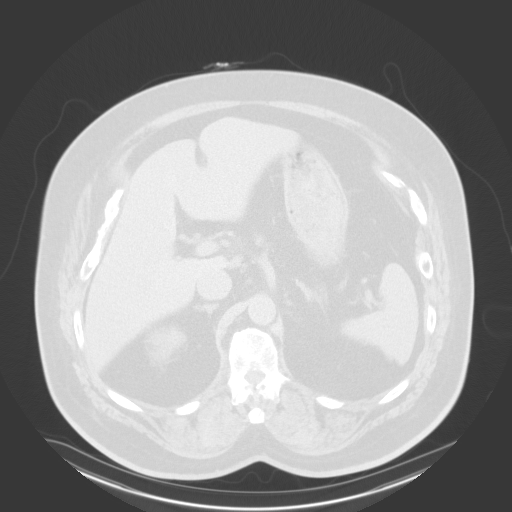
[im 80/90  lung]
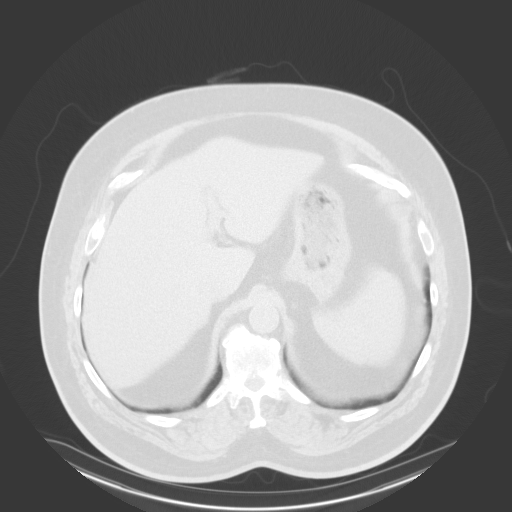
[im 85/90  soft-tissue]
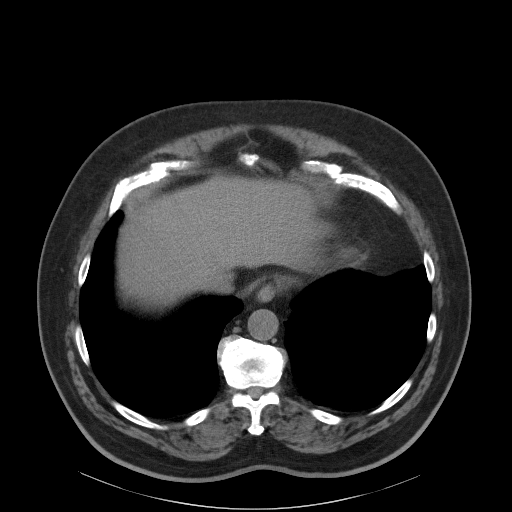
[im 85/90  lung]
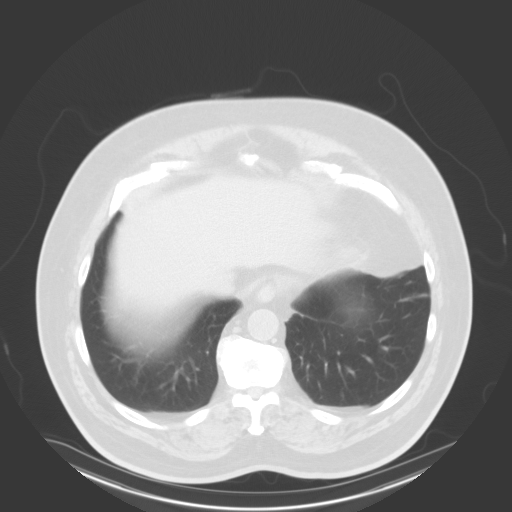

[14 of 32 positions shown; findings below may reference images not displayed]

FINDINGS: Lower chest: No acute abnormality.

Hepatobiliary: No focal liver abnormality is seen. No gallstones,
gallbladder wall thickening, or biliary dilatation.

Pancreas: Unremarkable. No pancreatic ductal dilatation or
surrounding inflammatory changes.

Spleen: Normal in size without focal abnormality.

Adrenals/Urinary Tract: Adrenal glands are within normal limits.
Kidneys demonstrate nonobstructing lower pole right renal stone
measuring 2-3 mm. No obstructive changes are seen. The bladder is
decompressed.

Stomach/Bowel: No obstructive or inflammatory changes of the colon
are noted. The appendix is within normal limits. Small bowel shows
no obstructive changes. Stomach is within normal limits.

Vascular/Lymphatic: No significant vascular findings are present. No
enlarged abdominal or pelvic lymph nodes.

Reproductive: Prostate is unremarkable.

Other: No ascites is noted. There is some inflammatory change noted
in the root of the mesentery of the small bowel without significant
venous engorgement. No associated mass lesion, adenopathy or fluid
collection is identified. The overall appearance is consistent with
mesenteric panniculitis.

Musculoskeletal: No acute or significant osseous findings.
IMPRESSION: Changes in the small bowel mesentery consistent with mesenteric
panniculitis. No associated adenopathy, fluid collection or focal
mass lesion is seen.

Nonobstructing right renal stone.

No other focal abnormality is noted.

## 2021-05-09 ENCOUNTER — Ambulatory Visit (INDEPENDENT_AMBULATORY_CARE_PROVIDER_SITE_OTHER): Payer: Medicare HMO | Admitting: Family Medicine

## 2021-05-09 ENCOUNTER — Encounter: Payer: Self-pay | Admitting: Family Medicine

## 2021-05-09 VITALS — BP 118/60 | HR 63 | Resp 16 | Ht 73.0 in | Wt 287.0 lb

## 2021-05-09 DIAGNOSIS — G4733 Obstructive sleep apnea (adult) (pediatric): Secondary | ICD-10-CM

## 2021-05-09 DIAGNOSIS — R5383 Other fatigue: Secondary | ICD-10-CM

## 2021-05-09 DIAGNOSIS — E890 Postprocedural hypothyroidism: Secondary | ICD-10-CM | POA: Diagnosis not present

## 2021-05-09 DIAGNOSIS — I10 Essential (primary) hypertension: Secondary | ICD-10-CM | POA: Diagnosis not present

## 2021-05-09 DIAGNOSIS — Z23 Encounter for immunization: Secondary | ICD-10-CM | POA: Diagnosis not present

## 2021-05-09 DIAGNOSIS — E785 Hyperlipidemia, unspecified: Secondary | ICD-10-CM | POA: Diagnosis not present

## 2021-05-09 DIAGNOSIS — E1169 Type 2 diabetes mellitus with other specified complication: Secondary | ICD-10-CM | POA: Diagnosis not present

## 2021-05-09 LAB — COMPREHENSIVE METABOLIC PANEL
ALT: 31 U/L (ref 0–53)
AST: 31 U/L (ref 0–37)
Albumin: 3.8 g/dL (ref 3.5–5.2)
Alkaline Phosphatase: 58 U/L (ref 39–117)
BUN: 20 mg/dL (ref 6–23)
CO2: 30 mEq/L (ref 19–32)
Calcium: 9.2 mg/dL (ref 8.4–10.5)
Chloride: 100 mEq/L (ref 96–112)
Creatinine, Ser: 0.7 mg/dL (ref 0.40–1.50)
GFR: 93.71 mL/min (ref >60.00)
Glucose, Bld: 107 mg/dL — ABNORMAL HIGH (ref 70–99)
Potassium: 3.6 mEq/L (ref 3.5–5.1)
Sodium: 138 mEq/L (ref 135–145)
Total Bilirubin: 0.6 mg/dL (ref 0.2–1.2)
Total Protein: 7 g/dL (ref 6.0–8.3)

## 2021-05-09 LAB — LIPID PANEL
Cholesterol: 128 mg/dL (ref 0–200)
HDL: 35.1 mg/dL — ABNORMAL LOW (ref >39.00)
LDL Cholesterol: 75 mg/dL (ref 0–99)
NonHDL: 92.44
Total CHOL/HDL Ratio: 4
Triglycerides: 87 mg/dL (ref 0.0–149.0)
VLDL: 17.4 mg/dL (ref 0.0–40.0)

## 2021-05-09 LAB — TSH: TSH: 3.19 u[IU]/mL (ref 0.35–5.50)

## 2021-05-09 LAB — CBC
HCT: 39.6 % (ref 39.0–52.0)
Hemoglobin: 13.2 g/dL (ref 13.0–17.0)
MCHC: 33.4 g/dL (ref 30.0–36.0)
MCV: 91.4 fl (ref 78.0–100.0)
Platelets: 299 10*3/uL (ref 150.0–400.0)
RBC: 4.33 Mil/uL (ref 4.22–5.81)
RDW: 14.3 % (ref 11.5–15.5)
WBC: 4.7 10*3/uL (ref 4.0–10.5)

## 2021-05-09 LAB — MICROALBUMIN / CREATININE URINE RATIO
Creatinine,U: 99.3 mg/dL
Microalb Creat Ratio: 0.9 mg/g (ref 0.0–30.0)
Microalb, Ur: 0.9 mg/dL (ref 0.0–1.9)

## 2021-05-09 LAB — HEMOGLOBIN A1C: Hgb A1c MFr Bld: 6.7 % — ABNORMAL HIGH (ref 4.6–6.5)

## 2021-05-09 NOTE — Assessment & Plan Note (Signed)
We discussed benefits of wt loss as well as adverse effects of obesity. Consistency with healthy diet and physical activity recommended.  

## 2021-05-09 NOTE — Assessment & Plan Note (Signed)
We discussed Dx and possible complications is not adequately treatment. He is not interested in CPAP at his time. WT loss will help.

## 2021-05-09 NOTE — Patient Instructions (Addendum)
A few things to remember from today's visit:   Type 2 diabetes mellitus with other specified complication, without long-term current use of insulin (Isaac Carpenter) - Plan: Hemoglobin A1c, Comprehensive metabolic panel, Microalbumin / creatinine urine ratio  Hypertension, essential, benign - Plan: Comprehensive metabolic panel, CBC  Hypothyroidism, postsurgical - Plan: TSH  Hyperlipidemia, unspecified hyperlipidemia type - Plan: Comprehensive metabolic panel, Lipid panel  OSA (obstructive sleep apnea)  Other fatigue - Plan: CBC  If you need refills please call your pharmacy. Do not use My Chart to request refills or for acute issues that need immediate attention.   No changes today. Consider wearing a CPAP for sleep apnea. Fatigue can be caused by sleep apnea and recent COVID infection. Monitor blood pressure at home once per week.  Please be sure medication list is accurate. If a new problem present, please set up appointment sooner than planned today. Living With Sleep Apnea Sleep apnea is a condition in which breathing pauses or becomes shallow during sleep. Sleep apnea is most commonly caused by a collapsed or blocked airway. People with sleep apnea usually snore loudly. They may have times when they gasp and stop breathing for 10 seconds or more during sleep. This may happen many times during the night. The breaks in breathing also interrupt the deep sleep that you need to feel rested. Even if you do not completely wake up from the gaps in breathing, your sleep may not be restful and you feel tired during the day. You may also have a headache in the morning and low energy during the day, and you may feel anxious or depressed. How can sleep apnea affect me? Sleep apnea increases your chances of extreme tiredness during the day (daytime fatigue). It can also increase your risk for health conditions, such as: Heart attack. Stroke. Obesity. Type 2 diabetes. Heart failure. Irregular  heartbeat. High blood pressure. If you have daytime fatigue as a result of sleep apnea, you may be more likely to: Perform poorly at school or work. Fall asleep while driving. Have difficulty with attention. Develop depression or anxiety. Have sexual dysfunction. What actions can I take to manage sleep apnea? Sleep apnea treatment  If you were given a device to open your airway while you sleep, use it only as told by your health care provider. You may be given: An oral appliance. This is a custom-made mouthpiece that shifts your lower jaw forward. A continuous positive airway pressure (CPAP) device. This device blows air through a mask when you breathe out (exhale). A nasal expiratory positive airway pressure (EPAP) device. This device has valves that you put into each nostril. A bi-level positive airway pressure (BIPAP) device. This device blows air through a mask when you breathe in (inhale) and breathe out (exhale). You may need surgery if other treatments do not work for you. Sleep habits Go to sleep and wake up at the same time every day. This helps set your internal clock (circadian rhythm) for sleeping. If you stay up later than usual, such as on weekends, try to get up in the morning within 2 hours of your normal wake time. Try to get at least 7-9 hours of sleep each night. Stop using a computer, tablet, and mobile phone a few hours before bedtime. Do not take long naps during the day. If you nap, limit it to 30 minutes. Have a relaxing bedtime routine. Reading or listening to music may relax you and help you sleep. Use your bedroom only for sleep.  Keep your television and computer out of your bedroom. Keep your bedroom cool, dark, and quiet. Use a supportive mattress and pillows. Follow your health care provider's instructions for other changes to sleep habits. Nutrition Do not eat heavy meals in the evening. Do not have caffeine in the later part of the day. The effects of  caffeine can last for more than 5 hours. Follow your health care provider's or dietitian's instructions for any diet changes. Lifestyle   Do not drink alcohol before bedtime. Alcohol can cause you to fall asleep at first, but then it can cause you to wake up in the middle of the night and have trouble getting back to sleep. Do not use any products that contain nicotine or tobacco. These products include cigarettes, chewing tobacco, and vaping devices, such as e-cigarettes. If you need help quitting, ask your health care provider. Medicines Take over-the-counter and prescription medicines only as told by your health care provider. Do not use over-the-counter sleep medicine. You can become dependent on this medicine, and it can make sleep apnea worse. Do not use medicines, such as sedatives and narcotics, unless told by your health care provider. Activity Exercise on most days, but avoid exercising in the evening. Exercising near bedtime can interfere with sleeping. If possible, spend time outside every day. Natural light helps regulate your circadian rhythm. General information Lose weight if you need to, and maintain a healthy weight. Keep all follow-up visits. This is important. If you are having surgery, make sure to tell your health care provider that you have sleep apnea. You may need to bring your device with you. Where to find more information Learn more about sleep apnea and daytime fatigue from: American Sleep Association: sleepassociation.[place]: sleepfoundation.org National Heart, Lung, and Blood Institute: https://www.[place]-[place].biz/ Summary Sleep apnea is a condition in which breathing pauses or becomes shallow during sleep. Sleep apnea can cause daytime fatigue and other serious health conditions. You may need to wear a device while sleeping to help keep your airway open. If you are having surgery, make sure to tell your health care provider that you have sleep apnea.  You may need to bring your device with you. Making changes to sleep habits, diet, lifestyle, and activity can help you manage sleep apnea. This information is not intended to replace advice given to you by your health care provider. Make sure you discuss any questions you have with your health care provider. Document Revised: 11/13/2020 Document Reviewed: 03/15/2020 Elsevier Patient Education  2022 [employer].

## 2021-05-09 NOTE — Assessment & Plan Note (Signed)
HgA1C has been at goal. Continue non pharmacologic treatment. Annual eye exam, periodic dental and foot care recommended. F/U in 5-6 months 

## 2021-05-09 NOTE — Progress Notes (Signed)
HPI: Chief Complaint  Patient presents with   Medication Refill    All medications - 90 days.   Fatigue    Since he had covid around [place] is a 71 y.o. male, who is here today with his wife for chronic disease management. Last seen on 07/23/20. Hearing loss, so his wife helps with interrogation. Hypertension:  Medications:Lisinopril-HCTZ 20-25 mg daily. BP readings at home:Not checking. Side effects:None Negative for unusual or severe headache, visual changes, exertional chest pain, dyspnea,  focal weakness, or edema.  Lab Results  Component Value Date   CREATININE 0.75 07/23/2020   BUN 21 07/23/2020   NA 142 07/23/2020   K 3.9 07/23/2020   CL 101 07/23/2020   CO2 34 (H) 07/23/2020   Diabetes Mellitus II:  - Checking BG at home: Not checking. On non pharmacologic treatment. - eye exam: Over a year ago. - foot exam: Over a year ago.  - Negative for symptoms of hypoglycemia, polyuria, polydipsia, numbness extremities, foot ulcers/trauma  Lab Results  Component Value Date   HGBA1C 6.4 07/23/2020   Lab Results  Component Value Date   MICROALBUR 1.0 07/23/2020   Hyperlipidemia: Currently on Atorvastatin 20 mg daily. Following a low fat diet: Not consistently.. Side effects from medication:None. Lab Results  Component Value Date   CHOL 220 (H) 01/31/2020   HDL 43 01/31/2020   LDLCALC 149 (H) 01/31/2020   TRIG 153 (H) 01/31/2020   CHOLHDL 5.1 (H) 01/31/2020   Hypothyroidism:He has not taken for the past 2 weeks.  Lab Results  Component Value Date   TSH 3.81 01/31/2020   Fatigue has been worse since COVID 19 infections after christmas. Still coughing some, no hemoptysis, wheezing,or SOB. Negative for fever. He has lost some wt, eating smaller portions.  OSA: He is not wearing his CPAP. Seen by Isaac Carpenter on 09/20/20.  Review of Systems  Constitutional:  Negative for activity change and appetite change.  HENT:  Negative for nosebleeds  and sore throat.   Gastrointestinal:  Negative for abdominal pain, nausea and vomiting.  Genitourinary:  Negative for decreased urine volume and hematuria.  Musculoskeletal:  Negative for gait problem and myalgias.  Skin:  Negative for pallor and rash.  Neurological:  Negative for syncope and facial asymmetry.  Rest of ROS see pertinent positives and negatives in HPI.  Current Outpatient Medications on File Prior to Visit  Medication Sig Dispense Refill   aspirin EC 81 MG tablet Take 81 mg by mouth daily. Swallow whole.     atorvastatin (LIPITOR) 20 MG tablet TAKE 1 TABLET BY MOUTH DAILY 30 tablet 0   levothyroxine (SYNTHROID) 88 MCG tablet TAKE 1 TABLET BY MOUTH DAILY 90 tablet 0   lisinopril-hydrochlorothiazide (ZESTORETIC) 20-25 MG tablet Take 1 tablet by mouth daily. 90 tablet 2   tamsulosin (FLOMAX) 0.4 MG CAPS capsule Take 1 capsule by mouth daily.     No current facility-administered medications on file prior to visit.    Past Medical History:  Diagnosis Date   Arthritis    History of partial thyroidectomy STATES OVERACTIVE THYROID-- NO ISSUES SINCE AGE 55 AND NO MEDS   Hyperlipidemia    Hypertension    Thyroid disease     Allergies  Allergen Reactions   Testosterone Other (See Comments)    Social History   Socioeconomic History   Marital status: Married    Spouse name: Not on file   Number of children: Not on file   Years  of education: Not on file   Highest education level: Not on file  Occupational History   Not on file  Tobacco Use   Smoking status: Never   Smokeless tobacco: Current    Types: Chew   Tobacco comments:    started chewing tobacco off and on since 1970  Vaping Use   Vaping Use: Never used  Substance and Sexual Activity   Alcohol use: Never   Drug use: No   Sexual activity: Not on file  Other Topics Concern   Not on file  Social History Narrative   Not on file   Social Determinants of Health   Financial Resource Strain: Low Risk     Difficulty of Paying Living Expenses: Not hard at all  Food Insecurity: No Food Insecurity   Worried About Charity fundraiser in the Last Year: Never true   [place] in the Last Year: Never true  Transportation Needs: No Transportation Needs   Lack of Transportation (Medical): No   Lack of Transportation (Non-Medical): No  Physical Activity: Not on file  Stress: No Stress Concern Present   Feeling of Stress : Not at all  Social Connections: Moderately Isolated   Frequency of Communication with Friends and Family: Twice a week   Frequency of Social Gatherings with Friends and Family: Twice a week   Attends Religious Services: Never   Marine scientist or Organizations: No   Attends Archivist Meetings: Never   Marital Status: Living with partner   Today's Vitals   05/09/21 0857  BP: 118/60  Pulse: 63  Resp: 16  SpO2: 97%  Weight: 287 lb (130.2 kg)  Height: 6\' 1"  (1.854 m)   Body mass index is 37.87 kg/m.  Physical Exam Vitals and nursing note reviewed.  Constitutional:      General: He is not in acute distress.    Appearance: He is well-developed.  HENT:     Head: Normocephalic and atraumatic.  Eyes:     Conjunctiva/sclera: Conjunctivae normal.  Cardiovascular:     Rate and Rhythm: Normal rate and regular rhythm.     Pulses:          Dorsalis pedis pulses are 2+ on the right side and 2+ on the left side.     Heart sounds: No murmur heard.    Comments: Trace pitting LE edema, bilateral. Pulmonary:     Effort: Pulmonary effort is normal. No respiratory distress.     Breath sounds: Normal breath sounds.  Abdominal:     Palpations: Abdomen is soft. There is no hepatomegaly or mass.     Tenderness: There is no abdominal tenderness.  Musculoskeletal:     Right lower leg: Edema present.     Left lower leg: Edema present.  Lymphadenopathy:     Cervical: No cervical adenopathy.  Skin:    General: Skin is warm.     Findings: No erythema or rash.   Neurological:     Mental Status: He is alert and oriented to person, place, and time.     Cranial Nerves: No cranial nerve deficit.     Gait: Gait normal.  Psychiatric:     Comments: Well groomed, good eye contact.   ASSESSMENT AND PLAN:  [name] was seen today for medication refill and fatigue.  Diagnoses and all orders for this visit: Orders Placed This Encounter  Procedures   Flu Vaccine QUAD High Dose(Fluad)   TSH   Hemoglobin A1c  Comprehensive metabolic panel   Lipid panel   Microalbumin / creatinine urine ratio   CBC   Lab Results  Component Value Date   TSH 3.19 05/09/2021   Lab Results  Component Value Date   CREATININE 0.70 05/09/2021   BUN 20 05/09/2021   NA 138 05/09/2021   K 3.6 05/09/2021   CL 100 05/09/2021   CO2 30 05/09/2021   Lab Results  Component Value Date   WBC 4.7 05/09/2021   HGB 13.2 05/09/2021   HCT 39.6 05/09/2021   MCV 91.4 05/09/2021   PLT 299.0 05/09/2021   Lab Results  Component Value Date   MICROALBUR 0.9 05/09/2021   MICROALBUR 1.0 07/23/2020   Lab Results  Component Value Date   HGBA1C 6.7 (H) 05/09/2021   Lab Results  Component Value Date   CHOL 128 05/09/2021   HDL 35.10 (L) 05/09/2021   LDLCALC 75 05/09/2021   TRIG 87.0 05/09/2021   CHOLHDL 4 05/09/2021   Type 2 diabetes mellitus with other specified complication ([place]) Isaac Carpenter has been at goal. Continue non pharmacologic treatment. Annual eye exam, periodic dental and foot care recommended. F/U in 5-6 months   OSA (obstructive sleep apnea) We discussed Dx and possible complications is not adequately treatment. He is not interested in CPAP at his time. WT loss will help.  Hypothyroidism, postsurgical He has not taken Levothyroxine in 10-14 days. Resume Levothyroxine same dose.  Hypertension, essential, benign BP adequately controlled. Continue Lisinopril-HCTZ 20-25 mg daily. Low salt diet. Monitor BP regularly. Eye exam overdue.  Morbid obesity  (HCC)-BMI 37.8 with DM II,HLD,OSA,HTN We discussed benefits of wt loss as well as adverse effects of obesity. Consistency with healthy diet and physical activity recommended.  Hyperlipidemia, unspecified hyperlipidemia type Continue Atorvastatin 20 mg daily and low fat diet. Further recommendations according to lipid panel result.  Other fatigue Some of his chronic medical problems can be contributing factors as well as recent acute illness, COVID 19 infection. Further recommendations according to lab results.  Need for influenza vaccination -     Flu Vaccine QUAD High Dose(Fluad)  Return in about 6 months (around 11/06/2021) for DM II,HTN.  Isaac Scarpelli [place], MD [facility]. [place] office.

## 2021-05-09 NOTE — Assessment & Plan Note (Addendum)
BP adequately controlled. Continue Lisinopril-HCTZ 20-25 mg daily. Low salt diet. Monitor BP regularly. Eye exam overdue.

## 2021-05-09 NOTE — Assessment & Plan Note (Signed)
He has not taken Levothyroxine in 10-14 days. Resume Levothyroxine same dose.

## 2021-05-11 DIAGNOSIS — E785 Hyperlipidemia, unspecified: Secondary | ICD-10-CM | POA: Insufficient documentation

## 2021-05-11 MED ORDER — LISINOPRIL-HYDROCHLOROTHIAZIDE 20-25 MG PO TABS: 1.0000 | ORAL_TABLET | Freq: Every day | ORAL | 2 refills | Status: DC

## 2021-05-11 MED ORDER — ATORVASTATIN CALCIUM 20 MG PO TABS: 20.0000 mg | ORAL_TABLET | Freq: Every day | ORAL | 3 refills | Status: DC

## 2021-05-11 MED ORDER — LEVOTHYROXINE SODIUM 88 MCG PO TABS: 88.0000 ug | ORAL_TABLET | Freq: Every day | ORAL | 2 refills | Status: DC

## 2021-06-24 ENCOUNTER — Encounter: Payer: Self-pay | Admitting: Family Medicine

## 2021-06-25 ENCOUNTER — Encounter: Payer: Self-pay | Admitting: Family Medicine

## 2021-10-08 ENCOUNTER — Ambulatory Visit (INDEPENDENT_AMBULATORY_CARE_PROVIDER_SITE_OTHER): Payer: Medicare HMO

## 2021-10-08 VITALS — Ht 73.0 in | Wt 287.0 lb

## 2021-10-08 DIAGNOSIS — Z0000 Encounter for general adult medical examination without abnormal findings: Secondary | ICD-10-CM

## 2021-10-08 NOTE — Progress Notes (Cosign Needed)
Subjective:   Isaac Carpenter is a 71 y.o. male who presents for Medicare Annual/Subsequent preventive examination.  Review of Systems    Virtual Visit via Telephone Note  I connected with  Isaac Carpenter on 10/08/21 at 10:15 AM EDT by telephone and verified that I am speaking with the correct person using two identifiers.  Location: Patient: Home Provider: Office Persons participating in the virtual visit: patient/Nurse Health Advisor   I discussed the limitations, risks, security and privacy concerns of performing an evaluation and management service by telephone and the availability of in person appointments. The patient expressed understanding and agreed to proceed.  Interactive audio and video telecommunications were attempted between this nurse and patient, however failed, due to patient having technical difficulties OR patient did not have access to video capability.  We continued and completed visit with audio only.  Some vital signs may be absent or patient reported.   Isaac Peaches, LPN  Cardiac Risk Factors include: advanced age (>47mn, >>74women);hypertension;male gender     Objective:    Today's Vitals   10/08/21 1020  Weight: 287 lb (130.2 kg)  Height: '6\' 1"'$  (1.854 m)   Body mass index is 37.87 kg/m.     10/08/2021   10:30 AM 09/30/2020    9:52 AM  Advanced Directives  Does Patient Have a Medical Advance Directive? Yes Yes  Type of [place] will  Does patient want to make changes to medical advance directive? No - Patient declined   Copy of [place]ardin Chart? No - copy requested No - copy requested    Current Medications (verified) Outpatient Encounter Medications as of 10/08/2021  Medication Sig   aspirin EC 81 MG tablet Take 81 mg by mouth daily. Swallow whole.   atorvastatin (LIPITOR) 20 MG tablet Take 1 tablet (20 mg total) by mouth daily.    levothyroxine (SYNTHROID) 88 MCG tablet Take 1 tablet (88 mcg total) by mouth daily.   lisinopril-hydrochlorothiazide (ZESTORETIC) 20-25 MG tablet Take 1 tablet by mouth daily.   tamsulosin (FLOMAX) 0.4 MG CAPS capsule Take 1 capsule by mouth daily.   No facility-administered encounter medications on file as of 10/08/2021.    Allergies (verified) Testosterone   History: Past Medical History:  Diagnosis Date   Arthritis    History of partial thyroidectomy STATES OVERACTIVE THYROID-- NO ISSUES SINCE AGE 82 AND NO MEDS   Hyperlipidemia    Hypertension    Thyroid disease    Past Surgical History:  Procedure Laterality Date   CIRCUMCISION  01/04/2012   Procedure: CIRCUMCISION ADULT;  Surgeon: SAilene Rud MD;  [facility]  Service: Urology;  Laterality: N/A;   COLONOSCOPY  04/08/2020   Vito Cirigliano   THYROIDECTOMY, PARTIAL  AGE 71  OVERACTIVE THYROID   Family History  Problem Relation Age of Onset   Cancer Mother    COPD Father    Heart attack Father    Cancer Sister    Cancer Brother    Cancer Sister    Cancer Brother    Colon cancer Neg Hx    Colon polyps Neg Hx    Esophageal cancer Neg Hx    Rectal cancer Neg Hx    Stomach cancer Neg Hx    Social History   Socioeconomic History   Marital status: Married    Spouse name: Not on file   Number of children:  Not on file   Years of education: Not on file   Highest education level: Not on file  Occupational History   Not on file  Tobacco Use   Smoking status: Never   Smokeless tobacco: Current    Types: Chew   Tobacco comments:    started chewing tobacco off and on since 1970  Vaping Use   Vaping Use: Never used  Substance and Sexual Activity   Alcohol use: Never   Drug use: No   Sexual activity: Not on file  Other Topics Concern   Not on file  Social History Narrative   Not on file   Social Determinants of Health   Financial Resource Strain: Low Risk  (10/08/2021)    Overall Financial Resource Strain (CARDIA)    Difficulty of Paying Living Expenses: Not hard at all  Food Insecurity: No Food Insecurity (10/08/2021)   Hunger Vital Sign    Worried About Running Out of Food in the Last Year: Never true    [place] in the Last Year: Never true  Transportation Needs: No Transportation Needs (10/08/2021)   PRAPARE - Hydrologist (Medical): No    Lack of Transportation (Non-Medical): No  Physical Activity: Inactive (10/08/2021)   Exercise Vital Sign    Days of Exercise per Week: 0 days    Minutes of Exercise per Session: 0 min  Stress: No Stress Concern Present (10/08/2021)   [place]    Feeling of Stress : Not at all  Social Connections: Socially Isolated (10/08/2021)   Social Connection and Isolation Panel [NHANES]    Frequency of Communication with Friends and Family: More than three times a week    Frequency of Social Gatherings with Friends and Family: More than three times a week    Attends Religious Services: Never    Marine scientist or Organizations: No    Attends Music therapist: Never    Marital Status: Divorced    Tobacco Counseling Ready to quit: No Counseling given: Yes Tobacco comments: started chewing tobacco off and on since 1970   Clinical Intake:  Pre-visit preparation completed: No  Pain : No/denies pain     BMI - recorded: 37.87 Nutritional Status: BMI > 30  Obese Nutritional Risks: None Diabetes: No  How often do you need to have someone help you when you read instructions, pamphlets, or other written materials from your doctor or pharmacy?: 1 - Never  Diabetic?  No  Interpreter Needed?: No  Information entered by :: Isaac Arbour LPN   Activities of Daily Living    10/08/2021   10:25 AM  In your present state of health, do you have any difficulty performing the following activities:   Hearing? 0  Vision? 0  Difficulty concentrating or making decisions? 0  Walking or climbing stairs? 0  Dressing or bathing? 0  Doing errands, shopping? 0  Preparing Food and eating ? N  Using the Toilet? N  In the past six months, have you accidently leaked urine? N  Do you have problems with loss of bowel control? N  Managing your Medications? N  Managing your Finances? N  Housekeeping or managing your Housekeeping? N    Patient Care Team: [place], [name], MD as PCP - General (Family Medicine)  Indicate any recent Medical Services you may have received from other than [facility] providers in the past year (date may be approximate).  Assessment:   This is a routine wellness examination for [place].  Hearing/Vision screen Hearing Screening - Comments:: No hearing difficulty Vision Screening - Comments:: Wears reading glasses. Followed by Dr Schuyler Amor  Dietary issues and exercise activities discussed: Exercise limited by: None identified   Goals Addressed               This Visit's Progress     Patient stated (pt-stated)        I want to get closer to God.       Depression Screen    10/08/2021   10:23 AM 05/09/2021    9:04 AM 09/30/2020    9:54 AM 09/30/2020    9:51 AM 07/23/2020   11:07 AM  PHQ 2/9 Scores  PHQ - 2 Score 0 1 0 0 0  PHQ- 9 Score  7       Fall Risk    10/08/2021   10:25 AM 05/09/2021    3:15 PM 09/30/2020    9:54 AM  [place] in the past year? 0 0 0  Number falls in past yr: 0 0 0  Injury with Fall? 0 0 0  Risk for fall due to : No Fall Risks  No Fall Risks  Follow up  Education provided Falls evaluation completed    [place]:  Any stairs in or around the home? No  If so, are there any without handrails? No  Home free of loose throw rugs in walkways, pet beds, electrical cords, etc? Yes  Adequate lighting in your home to reduce risk of falls? Yes   ASSISTIVE DEVICES UTILIZED TO PREVENT  FALLS:  Life alert? No  Use of a cane, walker or w/c? No  Grab bars in the bathroom? Yes  Shower chair or bench in shower? Yes  Elevated toilet seat or a handicapped toilet? No   TIMED UP AND GO:  Was the test performed? No . Audio Visit Cognitive Function:        10/08/2021   10:29 AM  6CIT Screen  What Year? 0 points  What month? 0 points  What time? 0 points  Count back from 20 0 points  Months in reverse 0 points  Repeat phrase 0 points  Total Score 0 points    Immunizations Immunization History  Administered Date(s) Administered   Fluad Quad(high Dose 65+) 01/31/2020, 05/09/2021   Influenza Split 04/29/2009, 04/04/2012, 03/12/2014   Influenza, High Dose Seasonal PF 03/15/2018   Influenza,inj,Quad PF,6+ Mos 05/12/2016   PFIZER(Purple Top)SARS-COV-2 Vaccination 06/12/2019, 07/03/2019   Pneumococcal Conjugate-13 11/09/2016   Pneumococcal Polysaccharide-23 11/23/2017   Tdap 07/07/2011    TDAP status: Due, Education has been provided regarding the importance of this vaccine. Advised may receive this vaccine at local pharmacy or Health Dept. Aware to provide a copy of the vaccination record if obtained from local pharmacy or Health Dept. Verbalized acceptance and understanding.  Flu Vaccine status: Up to date  Pneumococcal vaccine status: Up to date  Covid-19 vaccine status: Completed vaccines  Qualifies for Shingles Vaccine? Yes   Zostavax completed No   Shingrix Completed?: No.    Education has been provided regarding the importance of this vaccine. Patient has been advised to call insurance company to determine out of pocket expense if they have not yet received this vaccine. Advised may also receive vaccine at local pharmacy or Health Dept. Verbalized acceptance and understanding.  Screening Tests Health Maintenance  Topic Date Due  OPHTHALMOLOGY EXAM  Never done   COVID-19 Vaccine (3 - Pfizer series) 10/24/2021 (Originally 08/28/2019)   Zoster Vaccines-  Shingrix (1 of 2) 01/08/2022 (Originally 05/20/2001)   TETANUS/TDAP  10/09/2022 (Originally 07/06/2021)   HEMOGLOBIN A1C  11/06/2021   INFLUENZA VACCINE  11/18/2021   FOOT EXAM  05/09/2022   COLONOSCOPY (Pts 45-14yr Insurance coverage will need to be confirmed)  04/08/2025   Pneumonia Vaccine 70 Years old  Completed   Hepatitis C Screening  Completed   HPV VACCINES  Aged Out    Health Maintenance  Health Maintenance Due  Topic Date Due   OPHTHALMOLOGY EXAM  Never done    Colorectal cancer screening: Type of screening: Colonoscopy. Completed 04/08/20. Repeat every 5 years  Lung Cancer Screening: (Low Dose CT Chest recommended if Age 70-80years, 30 pack-year currently smoking OR have quit w/in 15years.) does not qualify.     Additional Screening:  Hepatitis C Screening: does qualify; Completed 07/23/20  Vision Screening: Recommended annual ophthalmology exams for early detection of glaucoma and other disorders of the eye. Is the patient up to date with their annual eye exam?  Yes  Who is the provider or what is the name of the office in which the patient attends annual eye exams? Dr GSchuyler AmorIf pt is not established with a provider, would they like to be referred to a provider to establish care? No .   Dental Screening: Recommended annual dental exams for proper oral hygiene  Community Resource Referral / Chronic Care Management:  CRR required this visit?  No   CCM required this visit?  No      Plan:     I have personally reviewed and noted the following in the patient's chart:   Medical and social history Use of alcohol, tobacco or illicit drugs  Current medications and supplements including opioid prescriptions. Patient is not currently taking opioid prescriptions. Functional ability and status Nutritional status Physical activity Advanced directives List of other physicians Hospitalizations, surgeries, and ER visits in previous 12 months Vitals Screenings to  include cognitive, depression, and falls Referrals and appointments  In addition, I have reviewed and discussed with patient certain preventive protocols, quality metrics, and best practice recommendations. A written personalized care plan for preventive services as well as general preventive health recommendations were provided to patient.     BCriselda Peaches LPN   [id number]  Nurse Notes: None

## 2021-10-08 NOTE — Patient Instructions (Addendum)
Mr. Isaac Carpenter , Thank you for taking time to come for your Medicare Wellness Visit. I appreciate your ongoing commitment to your health goals. Please review the following plan we discussed and let me know if I can assist you in the future.   These are the goals we discussed:  Goals       Patient stated (pt-stated)      I want to get closer to God.      Weight Loss Achieved      Evidence-based guidance:  Review medication that may contribute to weight gain, such as corticosteroid, beta-blocker, tricyclic antidepressant, oral antihyperglycemic; advocate for changes when appropriate.  Perform or refer to registered dietitian to perform comprehensive nutrition assessment that includes disordered-eating behaviors, such as binge-eating, emotional or compulsive eating, grazing.  Counsel patient regarding health risks of obesity and that weight loss goal of 5 to 10 percent of initial weight will improve risk.  Recommend initial weight loss goal of 3 to 5 percent of bodyweight; increase weight-loss goals based on patient success as achieving greater weight loss continues to reduce risk.  Propose a calorie-reduced diet based on the patient's preferences and health status.  Provide ongoing emotional support or cognitive behavioral therapy and dietitian services (individual, group, virtual) over at least 6 months with a minimum of 14 encounters to best facilitate weight loss.  Provide monthly follow-up for 12 months when weight loss goal is met to assist with maintenance of weight loss.  Encourage increased physical activity or exercise based on individual age, risk, and ability up to 200 to 300 minutes per week that includes aerobic and resistance training.  Encourage reduction in sedentary behaviors by replacing them with nonexercise yet active leisure pursuits.  Identify physical barriers, such as change in posture, balance, gait patterns, joint pain, and environmental barriers to activity.  Consider  referral to rehabilitation therapy, especially when mobility or function is impaired due to osteoarthritis and obesity.  Consider referral to weight-loss program that has published evidence of safety and efficacy if on-site intensive intervention is unavailable or patient preference.  Prepare patient for use of pharmacologic therapy as an adjunct to lifestyle changes based on body mass index, patient agreement and presence of risk factors or comorbidities.  Evaluate efficacy of pharmacologic therapy (weight loss) and tolerance to medication periodically.  Engage in shared decision-making regarding referral to bariatric surgeon for consultation and evaluation when weight-loss goal has not been accomplished by behavioral therapy with or without pharmacologic therapy.   Notes:         This is a list of the screening recommended for you and due dates:  Health Maintenance  Topic Date Due   Eye exam for diabetics  Never done   COVID-19 Vaccine (3 - Pfizer series) 10/24/2021*   Zoster (Shingles) Vaccine (1 of 2) 01/08/2022*   Tetanus Vaccine  10/09/2022*   Hemoglobin A1C  11/06/2021   Flu Shot  11/18/2021   Complete foot exam   05/09/2022   Colon Cancer Screening  04/08/2025   Pneumonia Vaccine  Completed   Hepatitis C Screening: USPSTF Recommendation to screen - Ages 18-79 yo.  Completed   HPV Vaccine  Aged Out  *Topic was postponed. The date shown is not the original due date.   Advanced directives: Yes  Conditions/risks identified: None  Next appointment: Follow up in one year for your annual wellness visit.    Preventive Care 62 Years and Older, Male Preventive care refers to lifestyle choices and visits with your  health care provider that can promote health and wellness. What does preventive care include? A yearly physical exam. This is also called an annual well check. Dental exams once or twice a year. Routine eye exams. Ask your health care provider how often you should have  your eyes checked. Personal lifestyle choices, including: Daily care of your teeth and gums. Regular physical activity. Eating a healthy diet. Avoiding tobacco and drug use. Limiting alcohol use. Practicing safe sex. Taking low doses of aspirin every day. Taking vitamin and mineral supplements as recommended by your health care provider. What happens during an annual well check? The services and screenings done by your health care provider during your annual well check will depend on your age, overall health, lifestyle risk factors, and family history of disease. Counseling  Your health care provider may ask you questions about your: Alcohol use. Tobacco use. Drug use. Emotional well-being. Home and relationship well-being. Sexual activity. Eating habits. History of falls. Memory and ability to understand (cognition). Work and work Statistician. Screening  You may have the following tests or measurements: Height, weight, and BMI. Blood pressure. Lipid and cholesterol levels. These may be checked every 5 years, or more frequently if you are over 61 years old. Skin check. Lung cancer screening. You may have this screening every year starting at age 63 if you have a 30-pack-year history of smoking and currently smoke or have quit within the past 15 years. Fecal occult blood test (FOBT) of the stool. You may have this test every year starting at age 72. Flexible sigmoidoscopy or colonoscopy. You may have a sigmoidoscopy every 5 years or a colonoscopy every 10 years starting at age 69. Prostate cancer screening. Recommendations will vary depending on your family history and other risks. Hepatitis C blood test. Hepatitis B blood test. Sexually transmitted disease (STD) testing. Diabetes screening. This is done by checking your blood sugar (glucose) after you have not eaten for a while (fasting). You may have this done every 1-3 years. Abdominal aortic aneurysm (AAA) screening. You may  need this if you are a current or former smoker. Osteoporosis. You may be screened starting at age 63 if you are at high risk. Talk with your health care provider about your test results, treatment options, and if necessary, the need for more tests. Vaccines  Your health care provider may recommend certain vaccines, such as: Influenza vaccine. This is recommended every year. Tetanus, diphtheria, and acellular pertussis (Tdap, Td) vaccine. You may need a Td booster every 10 years. Zoster vaccine. You may need this after age 58. Pneumococcal 13-valent conjugate (PCV13) vaccine. One dose is recommended after age 50. Pneumococcal polysaccharide (PPSV23) vaccine. One dose is recommended after age 40. Talk to your health care provider about which screenings and vaccines you need and how often you need them. This information is not intended to replace advice given to you by your health care provider. Make sure you discuss any questions you have with your health care provider. Document Released: 05/03/2015 Document Revised: 12/25/2015 Document Reviewed: 02/05/2015 Elsevier Interactive Patient Education  2017 [place] Prevention in the Home Falls can cause injuries. They can happen to people of all ages. There are many things you can do to make your home safe and to help prevent falls. What can I do on the outside of my home? Regularly fix the edges of walkways and driveways and fix any cracks. Remove anything that might make you trip as you walk through a door,  such as a raised step or threshold. Trim any bushes or trees on the path to your home. Use bright outdoor lighting. Clear any walking paths of anything that might make someone trip, such as rocks or tools. Regularly check to see if handrails are loose or broken. Make sure that both sides of any steps have handrails. Any raised decks and porches should have guardrails on the edges. Have any leaves, snow, or ice cleared  regularly. Use sand or salt on walking paths during winter. Clean up any spills in your garage right away. This includes oil or grease spills. What can I do in the bathroom? Use night lights. Install grab bars by the toilet and in the tub and shower. Do not use towel bars as grab bars. Use non-skid mats or decals in the tub or shower. If you need to sit down in the shower, use a plastic, non-slip stool. Keep the floor dry. Clean up any water that spills on the floor as soon as it happens. Remove soap buildup in the tub or shower regularly. Attach bath mats securely with double-sided non-slip rug tape. Do not have throw rugs and other things on the floor that can make you trip. What can I do in the bedroom? Use night lights. Make sure that you have a light by your bed that is easy to reach. Do not use any sheets or blankets that are too big for your bed. They should not hang down onto the floor. Have a firm chair that has side arms. You can use this for support while you get dressed. Do not have throw rugs and other things on the floor that can make you trip. What can I do in the kitchen? Clean up any spills right away. Avoid walking on wet floors. Keep items that you use a lot in easy-to-reach places. If you need to reach something above you, use a strong step stool that has a grab bar. Keep electrical cords out of the way. Do not use floor polish or wax that makes floors slippery. If you must use wax, use non-skid floor wax. Do not have throw rugs and other things on the floor that can make you trip. What can I do with my stairs? Do not leave any items on the stairs. Make sure that there are handrails on both sides of the stairs and use them. Fix handrails that are broken or loose. Make sure that handrails are as long as the stairways. Check any carpeting to make sure that it is firmly attached to the stairs. Fix any carpet that is loose or worn. Avoid having throw rugs at the top or  bottom of the stairs. If you do have throw rugs, attach them to the floor with carpet tape. Make sure that you have a light switch at the top of the stairs and the bottom of the stairs. If you do not have them, ask someone to add them for you. What else can I do to help prevent falls? Wear shoes that: Do not have high heels. Have rubber bottoms. Are comfortable and fit you well. Are closed at the toe. Do not wear sandals. If you use a stepladder: Make sure that it is fully opened. Do not climb a closed stepladder. Make sure that both sides of the stepladder are locked into place. Ask someone to hold it for you, if possible. Clearly mark and make sure that you can see: Any grab bars or handrails. First and last steps. Where  the edge of each step is. Use tools that help you move around (mobility aids) if they are needed. These include: Canes. Walkers. Scooters. Crutches. Turn on the lights when you go into a dark area. Replace any light bulbs as soon as they burn out. Set up your furniture so you have a clear path. Avoid moving your furniture around. If any of your floors are uneven, fix them. If there are any pets around you, be aware of where they are. Review your medicines with your doctor. Some medicines can make you feel dizzy. This can increase your chance of falling. Ask your doctor what other things that you can do to help prevent falls. This information is not intended to replace advice given to you by your health care provider. Make sure you discuss any questions you have with your health care provider. Document Released: 01/31/2009 Document Revised: 09/12/2015 Document Reviewed: 05/11/2014 Elsevier Interactive Patient Education  2017 [employer].

## 2021-12-26 DIAGNOSIS — N471 Phimosis: Secondary | ICD-10-CM | POA: Diagnosis not present

## 2021-12-26 DIAGNOSIS — N3000 Acute cystitis without hematuria: Secondary | ICD-10-CM | POA: Diagnosis not present

## 2021-12-26 DIAGNOSIS — R972 Elevated prostate specific antigen [PSA]: Secondary | ICD-10-CM | POA: Diagnosis not present

## 2021-12-26 DIAGNOSIS — N481 Balanitis: Secondary | ICD-10-CM | POA: Diagnosis not present

## 2022-03-06 ENCOUNTER — Other Ambulatory Visit: Payer: Self-pay

## 2022-03-06 DIAGNOSIS — I10 Essential (primary) hypertension: Secondary | ICD-10-CM

## 2022-03-06 MED ORDER — LISINOPRIL-HYDROCHLOROTHIAZIDE 20-25 MG PO TABS: 1.0000 | ORAL_TABLET | Freq: Every day | ORAL | 1 refills | Status: DC

## 2022-03-17 DIAGNOSIS — H02132 Senile ectropion of right lower eyelid: Secondary | ICD-10-CM | POA: Diagnosis not present

## 2022-03-17 DIAGNOSIS — H02135 Senile ectropion of left lower eyelid: Secondary | ICD-10-CM | POA: Diagnosis not present

## 2022-03-17 DIAGNOSIS — H01026 Squamous blepharitis left eye, unspecified eyelid: Secondary | ICD-10-CM | POA: Diagnosis not present

## 2022-03-17 DIAGNOSIS — H01023 Squamous blepharitis right eye, unspecified eyelid: Secondary | ICD-10-CM | POA: Diagnosis not present

## 2022-03-17 DIAGNOSIS — H00025 Hordeolum internum left lower eyelid: Secondary | ICD-10-CM | POA: Diagnosis not present

## 2022-03-17 DIAGNOSIS — H04123 Dry eye syndrome of bilateral lacrimal glands: Secondary | ICD-10-CM | POA: Diagnosis not present

## 2022-04-01 DIAGNOSIS — R972 Elevated prostate specific antigen [PSA]: Secondary | ICD-10-CM | POA: Diagnosis not present

## 2022-04-08 DIAGNOSIS — R972 Elevated prostate specific antigen [PSA]: Secondary | ICD-10-CM | POA: Diagnosis not present

## 2022-05-01 ENCOUNTER — Other Ambulatory Visit: Payer: Self-pay

## 2022-05-01 MED ORDER — ATORVASTATIN CALCIUM 20 MG PO TABS: 20.0000 mg | ORAL_TABLET | Freq: Every day | ORAL | 1 refills | Status: DC

## 2022-05-12 ENCOUNTER — Other Ambulatory Visit: Payer: Self-pay

## 2022-05-12 DIAGNOSIS — E890 Postprocedural hypothyroidism: Secondary | ICD-10-CM

## 2022-05-12 MED ORDER — LEVOTHYROXINE SODIUM 88 MCG PO TABS: 88.0000 ug | ORAL_TABLET | Freq: Every day | ORAL | 0 refills | Status: DC

## 2022-05-13 DIAGNOSIS — D075 Carcinoma in situ of prostate: Secondary | ICD-10-CM | POA: Diagnosis not present

## 2022-05-13 DIAGNOSIS — R972 Elevated prostate specific antigen [PSA]: Secondary | ICD-10-CM | POA: Diagnosis not present

## 2022-05-13 DIAGNOSIS — C61 Malignant neoplasm of prostate: Secondary | ICD-10-CM | POA: Diagnosis not present

## 2022-05-27 ENCOUNTER — Other Ambulatory Visit (HOSPITAL_COMMUNITY): Payer: Self-pay | Admitting: Urology

## 2022-05-27 DIAGNOSIS — C61 Malignant neoplasm of prostate: Secondary | ICD-10-CM

## 2022-06-02 DIAGNOSIS — R972 Elevated prostate specific antigen [PSA]: Secondary | ICD-10-CM | POA: Diagnosis not present

## 2022-06-04 DIAGNOSIS — N200 Calculus of kidney: Secondary | ICD-10-CM | POA: Diagnosis not present

## 2022-06-04 DIAGNOSIS — C61 Malignant neoplasm of prostate: Secondary | ICD-10-CM | POA: Diagnosis not present

## 2022-06-12 ENCOUNTER — Encounter (HOSPITAL_COMMUNITY)
Admission: RE | Admit: 2022-06-12 | Discharge: 2022-06-12 | Disposition: A | Discharge status: Home / Self Care | Payer: Medicare HMO | Source: Ambulatory Visit | Attending: Urology | Admitting: Urology

## 2022-06-12 DIAGNOSIS — C61 Malignant neoplasm of prostate: Secondary | ICD-10-CM | POA: Diagnosis not present

## 2022-06-12 MED ORDER — TECHNETIUM TC 99M MEDRONATE IV KIT
20.0000 | PACK | Freq: Once | INTRAVENOUS | Status: AC | PRN
  Administered 2022-06-12: 20 via INTRAVENOUS

## 2022-06-29 DIAGNOSIS — C61 Malignant neoplasm of prostate: Secondary | ICD-10-CM | POA: Diagnosis not present

## 2022-07-03 ENCOUNTER — Inpatient Hospital Stay
Admission: RE | Admit: 2022-07-03 | Discharge: 2022-07-03 | Disposition: A | Discharge status: Home / Self Care | Payer: Self-pay | Source: Ambulatory Visit | Attending: Radiation Oncology | Admitting: Radiation Oncology

## 2022-07-03 ENCOUNTER — Other Ambulatory Visit: Payer: Self-pay | Admitting: Radiation Oncology

## 2022-07-03 DIAGNOSIS — C61 Malignant neoplasm of prostate: Secondary | ICD-10-CM

## 2022-07-14 NOTE — Progress Notes (Signed)
GU Location of Tumor / Histology:  Prostate Ca  If Prostate Cancer, Gleason Score is (4 + 3) and PSA is (11.5 on 04/08/2022)  Biopsies      Past/Anticipated interventions by urology, if any: NA  Past/Anticipated interventions by medical oncology, if any: NA  Weight changes, if any:  No  IPSS:  4 SHIM:  5  Bowel/Bladder complaints, if any:  No  Nausea/Vomiting, if any: No  Pain issues, if any:  0/10  SAFETY ISSUES: Prior radiation? No Pacemaker/ICD? No Possible current pregnancy? Male Is the patient on methotrexate? No  Current Complaints / other details:

## 2022-07-17 ENCOUNTER — Encounter: Payer: Self-pay | Admitting: Radiation Oncology

## 2022-07-17 ENCOUNTER — Ambulatory Visit
Admission: RE | Admit: 2022-07-17 | Discharge: 2022-07-17 | Disposition: A | Discharge status: Home / Self Care | Payer: Medicare HMO | Source: Ambulatory Visit | Attending: Radiation Oncology | Admitting: Radiation Oncology

## 2022-07-17 VITALS — BP 119/56 | HR 50 | Temp 97.6°F | Resp 20 | Ht 73.0 in | Wt 299.2 lb

## 2022-07-17 DIAGNOSIS — Z79899 Other long term (current) drug therapy: Secondary | ICD-10-CM | POA: Diagnosis not present

## 2022-07-17 DIAGNOSIS — I10 Essential (primary) hypertension: Secondary | ICD-10-CM | POA: Diagnosis not present

## 2022-07-17 DIAGNOSIS — Z809 Family history of malignant neoplasm, unspecified: Secondary | ICD-10-CM | POA: Insufficient documentation

## 2022-07-17 DIAGNOSIS — E785 Hyperlipidemia, unspecified: Secondary | ICD-10-CM | POA: Insufficient documentation

## 2022-07-17 DIAGNOSIS — E079 Disorder of thyroid, unspecified: Secondary | ICD-10-CM | POA: Insufficient documentation

## 2022-07-17 DIAGNOSIS — Z7982 Long term (current) use of aspirin: Secondary | ICD-10-CM | POA: Insufficient documentation

## 2022-07-17 DIAGNOSIS — C61 Malignant neoplasm of prostate: Secondary | ICD-10-CM | POA: Insufficient documentation

## 2022-07-17 DIAGNOSIS — Z191 Hormone sensitive malignancy status: Secondary | ICD-10-CM | POA: Diagnosis not present

## 2022-07-17 NOTE — Progress Notes (Signed)
Radiation Oncology         [phone] ________________________________  Initial Outpatient Consultation  Name: ALEXADER SUTHERLAND MRN: [id number]  Date: 07/17/2022  DOB: October 08, 1950  [id number], Malka So, MD  Franchot Gallo, MD   REFERRING PHYSICIAN: Franchot Gallo, MD  DIAGNOSIS: 72 y.o. gentleman with Stage T1c adenocarcinoma of the prostate with Gleason score of 4+3, and PSA of 13.2.    ICD-10-CM   1. Malignant neoplasm of prostate [facility])  C61       HISTORY OF PRESENT ILLNESS: JONATAN ABILA is a 72 y.o. male with a diagnosis of prostate cancer. He has been followed by [facility] since 2013, when he was referred for painful erections and phimosis. He underwent circumcision on 01/04/12 by Dr. Gaynelle Arabian. More recently, he has been followed by Dr. Diona Fanti for a buried penis and occasional balanitis following circumcision. He was also found to have an elevated PSA of 8.68 back in 01/2020. However, this decreased to 4.28 on recheck in 03/2020. Unfortunately, his PSA increased again, to 5.86 in 10/2020, 8.46 in 12/2021, and most recently 11.5 in 03/2022. The patient proceeded to transrectal ultrasound with 12 biopsies of the prostate on 05/13/22.  The prostate volume measured 46 cc.  Out of 12 core biopsies, 4 were positive.  The maximum Gleason score was 4+3, and this was seen in the left mid lateral, left apex lateral, and left apex. Additionally, a small focus of Gleason 3+3 was seen in the right mid.   A repeat PSA on 06/02/22 showed a further rise to 13.2. He underwent staging CT A/P on 06/04/22 showing no evidence of metastatic disease and a staging bone scan on 06/12/22 was also negative for metastatic disease.  The patient reviewed the biopsy results with his urologist and he has kindly been referred today for discussion of potential radiation treatment options.He is accompanied by his wife, Mickel Baas, for today's visit.   PREVIOUS RADIATION THERAPY: No  PAST MEDICAL HISTORY:  Past  Medical History:  Diagnosis Date   Arthritis    History of partial thyroidectomy STATES OVERACTIVE THYROID-- NO ISSUES SINCE AGE 47 AND NO MEDS   Hyperlipidemia    Hypertension    Thyroid disease       PAST SURGICAL HISTORY: Past Surgical History:  Procedure Laterality Date   CIRCUMCISION  01/04/2012   Procedure: CIRCUMCISION ADULT;  Surgeon: Ailene Rud, MD;  Location: [facility];  Service: Urology;  Laterality: N/A;   COLONOSCOPY  04/08/2020   Vito Cirigliano   THYROIDECTOMY, PARTIAL  AGE 89   OVERACTIVE THYROID    FAMILY HISTORY:  Family History  Problem Relation Age of Onset   Cancer Mother    COPD Father    Heart attack Father    Cancer Sister    Cancer Brother    Cancer Sister    Cancer Brother    Colon cancer Neg Hx    Colon polyps Neg Hx    Esophageal cancer Neg Hx    Rectal cancer Neg Hx    Stomach cancer Neg Hx     SOCIAL HISTORY:  Social History   Socioeconomic History   Marital status: Married    Spouse name: Not on file   Number of children: Not on file   Years of education: Not on file   Highest education level: Not on file  Occupational History   Not on file  Tobacco Use   Smoking status: Never   Smokeless tobacco: Current  Types: Chew   Tobacco comments:    started chewing tobacco off and on since 1970  Vaping Use   Vaping Use: Never used  Substance and Sexual Activity   Alcohol use: Never   Drug use: No   Sexual activity: Not Currently  Other Topics Concern   Not on file  Social History Narrative   Not on file   Social Determinants of Health   Financial Resource Strain: Low Risk  (10/08/2021)   Overall Financial Resource Strain (CARDIA)    Difficulty of Paying Living Expenses: Not hard at all  Food Insecurity: No Food Insecurity (07/17/2022)   Hunger Vital Sign    Worried About Running Out of Food in the Last Year: Never true    Ran Out of Food in the Last Year: Never true  Transportation Needs: No  Transportation Needs (07/17/2022)   PRAPARE - Hydrologist (Medical): No    Lack of Transportation (Non-Medical): No  Physical Activity: Inactive (10/08/2021)   Exercise Vital Sign    Days of Exercise per Week: 0 days    Minutes of Exercise per Session: 0 min  Stress: No Stress Concern Present (10/08/2021)   [place]    Feeling of Stress : Not at all  Social Connections: Socially Isolated (10/08/2021)   Social Connection and Isolation Panel [NHANES]    Frequency of Communication with Friends and Family: More than three times a week    Frequency of Social Gatherings with Friends and Family: More than three times a week    Attends Religious Services: Never    Marine scientist or Organizations: No    Attends Archivist Meetings: Never    Marital Status: Divorced  Human resources officer Violence: Not At Risk (07/17/2022)   Humiliation, Afraid, Rape, and Kick questionnaire    Fear of Current or Ex-Partner: No    Emotionally Abused: No    Physically Abused: No    Sexually Abused: No    ALLERGIES: Testosterone  MEDICATIONS:  Current Outpatient Medications  Medication Sig Dispense Refill   cetirizine (ZYRTEC) 10 MG tablet Take 10 mg by mouth daily.     aspirin EC 81 MG tablet Take 81 mg by mouth daily. Swallow whole.     atorvastatin (LIPITOR) 20 MG tablet Take 1 tablet (20 mg total) by mouth daily. 90 tablet 1   levothyroxine (SYNTHROID) 88 MCG tablet Take 1 tablet (88 mcg total) by mouth daily. 90 tablet 0   lisinopril-hydrochlorothiazide (ZESTORETIC) 20-25 MG tablet Take 1 tablet by mouth daily. 90 tablet 1   tamsulosin (FLOMAX) 0.4 MG CAPS capsule Take 1 capsule by mouth daily.     No current facility-administered medications for this encounter.    REVIEW OF SYSTEMS:  On review of systems, the patient reports that he is doing well overall. He denies any chest pain, shortness of  breath, cough, fevers, chills, night sweats, unintended weight changes. He denies any bowel disturbances, and denies abdominal pain, nausea or vomiting. He denies any new musculoskeletal or joint aches or pains. His IPSS was 4, indicating mild urinary symptoms. His SHIM was 5, indicating he has severe erectile dysfunction. A complete review of systems is obtained and is otherwise negative.    PHYSICAL EXAM:  Wt Readings from Last 3 Encounters:  07/17/22 299 lb 3.2 oz (135.7 kg)  10/08/21 287 lb (130.2 kg)  05/09/21 287 lb (130.2 kg)   Temp Readings from  Last 3 Encounters:  07/17/22 97.6 F (36.4 C)  09/20/20 98 F (36.7 C)  04/08/20 97.7 F (36.5 C)   BP Readings from Last 3 Encounters:  07/17/22 (!) 119/56  05/09/21 118/60  09/20/20 124/80   Pulse Readings from Last 3 Encounters:  07/17/22 (!) 50  05/09/21 63  09/20/20 (!) 57   Pain Assessment Pain Score: 0-No pain/10  In general this is a well appearing Caucasian man in no acute distress. He's alert and oriented x4 and appropriate throughout the examination. Cardiopulmonary assessment is negative for acute distress, and he exhibits normal effort.     KPS = 100  100 - Normal; no complaints; no evidence of disease. 90   - Able to carry on normal activity; minor signs or symptoms of disease. 80   - Normal activity with effort; some signs or symptoms of disease. 41   - Cares for self; unable to carry on normal activity or to do active work. 60   - Requires occasional assistance, but is able to care for most of his personal needs. 50   - Requires considerable assistance and frequent medical care. 47   - Disabled; requires special care and assistance. 78   - Severely disabled; hospital admission is indicated although death not imminent. 35   - Very sick; hospital admission necessary; active supportive treatment necessary. 10   - Moribund; fatal processes progressing rapidly. 0     - Dead  Karnofsky DA, Abelmann [place], [place]  LS and Burchenal [facility] [phone]) The use of the nitrogen mustards in the palliative treatment of carcinoma: with particular reference to bronchogenic carcinoma Cancer 1 634-56  LABORATORY DATA:  Lab Results  Component Value Date   WBC 4.7 05/09/2021   HGB 13.2 05/09/2021   HCT 39.6 05/09/2021   MCV 91.4 05/09/2021   PLT 299.0 05/09/2021   Lab Results  Component Value Date   NA 138 05/09/2021   K 3.6 05/09/2021   CL 100 05/09/2021   CO2 30 05/09/2021   Lab Results  Component Value Date   ALT 31 05/09/2021   AST 31 05/09/2021   ALKPHOS 58 05/09/2021   BILITOT 0.6 05/09/2021     RADIOGRAPHY: No results found.    IMPRESSION/PLAN: 1. 72 y.o. gentleman with Stage T1c adenocarcinoma of the prostate with Gleason Score of 4+3, and PSA of 13.2. We discussed the patient's workup and outlined the nature of prostate cancer in this setting. The patient's T stage, Gleason's score, and PSA put him into the unfavorable intermediate risk group. Accordingly, he is eligible for a variety of potential treatment options including brachytherapy, 5.5 weeks of external radiation, or prostatectomy. We discussed the available radiation techniques, and focused on the details and logistics of delivery. We discussed and outlined the risks, benefits, short and long-term effects associated with radiotherapy and compared and contrasted these with prostatectomy. We discussed the role of SpaceOAR gel in reducing the rectal toxicity associated with radiotherapy. He appears to have a good understanding of his disease and our treatment recommendations which are of curative intent.  He was encouraged to ask questions that were answered to his stated satisfaction.  At the conclusion of our conversation, the patient is interested in moving forward with brachytherapy and use of SpaceOAR gel to reduce rectal toxicity from radiotherapy.  We will share our discussion with Dr. Diona Fanti and move forward with scheduling his CT [facility]  planning appointment in the near future.  The patient met briefly with Romie Jumper in  our office who will be working closely with him to coordinate OR scheduling and pre and post procedure appointments.  We will contact the pharmaceutical rep to ensure that [place] is available at the time of procedure.  We enjoyed meeting him and his wife today and look forward to continuing to participate in his care.  We personally spent 70 minutes in this encounter including chart review, reviewing radiological studies, meeting face-to-face with the patient, entering orders and completing documentation.    Nicholos Johns, PA-C    Tyler Pita, MD  [facility] Direct Dial: [phone]  Fax: [phone] [email or web address]  Skype  [email or web address]

## 2022-07-17 NOTE — Progress Notes (Signed)
Introduced myself to the patient as the prostate nurse navigator.  No barriers to care identified at this time.  He is here to discuss his radiation treatment options.  I gave him my business card and asked him to call me with questions or concerns.  Verbalized understanding.  ?

## 2022-07-20 ENCOUNTER — Telehealth: Payer: Self-pay | Admitting: *Deleted

## 2022-07-20 NOTE — Telephone Encounter (Signed)
CALLED PATIENT'S WIFE(LAUIRA) TO INFORM OF PRE-SEED AND IMPLANT DATE, LVM FOR A RETURN CALL

## 2022-07-29 ENCOUNTER — Telehealth: Payer: Self-pay | Admitting: *Deleted

## 2022-07-29 NOTE — Telephone Encounter (Signed)
CALLED PATIENT TO REMIND OF PRE-SEED APPTS. FOR 07-30-22, SPOKE WITH PATIENT AND HE IS AWARE OF THESE APPTS.

## 2022-07-29 NOTE — Progress Notes (Signed)
  Radiation Oncology         [phone]) [phone] ________________________________  Name: Isaac Carpenter MRN: [id number]  Date: 07/30/2022  DOB: Mar 28, 1951  SIMULATION AND TREATMENT PLANNING NOTE PUBIC ARCH STUDY  HM:CNOBSJ, Timoteo Expose, MD  Marcine Matar, MD  DIAGNOSIS: 72 y.o. gentleman with Stage T1c adenocarcinoma of the prostate with Gleason score of 4+3, and PSA of 13.2.   Oncology History  Malignant neoplasm of prostate  05/13/2022 Cancer Staging   Staging form: Prostate, AJCC 8th Edition - Clinical stage from 05/13/2022: Stage IIC (cT1c, cN0, cM0, PSA: 13.2, Grade Group: 3) - Signed by Marcello Fennel, PA-C on 07/17/2022 Histopathologic type: Adenocarcinoma, NOS Stage prefix: Initial diagnosis Prostate specific antigen (PSA) range: 10 to 19 Gleason primary pattern: 4 Gleason secondary pattern: 3 Gleason score: 7 Histologic grading system: 5 grade system Number of biopsy cores examined: 12 Number of biopsy cores positive: 4 Location of positive needle core biopsies: Both sides   07/17/2022 Initial Diagnosis   Malignant neoplasm of prostate (HCC)       ICD-10-CM   1. Malignant neoplasm of prostate  C61       COMPLEX SIMULATION:  The patient presented today for evaluation for possible prostate seed implant. He was brought to the radiation planning suite and placed supine on the CT couch. A 3-dimensional image study set was obtained in upload to the planning computer. There, on each axial slice, I contoured the prostate gland. Then, using three-dimensional radiation planning tools I reconstructed the prostate in view of the structures from the transperineal needle pathway to assess for possible pubic arch interference. In doing so, I did not appreciate any pubic arch interference. Also, the patient's prostate volume was estimated based on the drawn structure. The volume was 43 cc.  Given the pubic arch appearance and prostate volume, patient remains a good candidate to proceed with  prostate seed implant. Today, he freely provided informed written consent to proceed.    PLAN: The patient will undergo prostate seed implant.   ________________________________  Artist Pais. Kathrynn Running, M.D.

## 2022-07-29 NOTE — Progress Notes (Signed)
Radiation Oncology         [phone] ________________________________  Outpatient Follow up- Pre-seed visit  Name: Isaac Carpenter MRN: [id number]  Date: 07/30/2022  DOB: [birth year]  WJ:[id number]:Jordan, Timoteo ExposeBetty G, MD  Marcine Matarahlstedt, Stephen, MD   REFERRING PHYSICIAN: Marcine Matarahlstedt, Stephen, MD  DIAGNOSIS: 72 y.o. gentleman with Stage T1c adenocarcinoma of the prostate with Gleason score of 4+3, and PSA of 13.2.     ICD-10-CM   1. Malignant neoplasm of prostate  C61       HISTORY OF PRESENT ILLNESS: Isaac Carpenter is a 72 y.o. male with a diagnosis of prostate cancer.  He has been followed by [facility] since 2013, when he was referred for painful erections and phimosis. He underwent circumcision on 01/04/12 by Dr. Patsi Searsannenbaum. More recently, he has been followed by Dr. Retta Dionesahlstedt for a buried penis and occasional balanitis following circumcision. He was also found to have an elevated PSA of 8.68 back in 01/2020. However, this decreased to 4.28 on recheck in 03/2020. Unfortunately, his PSA increased again, to 5.86 in 10/2020, 8.46 in 12/2021, and most recently 11.5 in 03/2022. The patient proceeded to transrectal ultrasound with 12 biopsies of the prostate on 05/13/22.  The prostate volume measured 46 cc.  Out of 12 core biopsies, 4 were positive.  The maximum Gleason score was 4+3, and this was seen in the left mid lateral, left apex lateral, and left apex. Additionally, a small focus of Gleason 3+3 was seen in the right mid.    A repeat PSA on 06/02/22 showed a further rise to 13.2. He underwent staging CT A/P on 06/04/22 showing no evidence of metastatic disease and a staging bone scan on 06/12/22 was also negative for metastatic disease.  The patient reviewed the biopsy results with his urologist and was kindly referred to us for discussion of potential radiation treatment options. We initially met the patient on 07/17/22 and he was most interested in proceeding with brachytherapy and SpaceOAR gel placement  for treatment of his disease. He is here today for his pre-procedure imaging for planning and to answer any additional questions he may have about this treatment.   PREVIOUS RADIATION THERAPY: No  PAST MEDICAL HISTORY:  Past Medical History:  Diagnosis Date   Arthritis    History of partial thyroidectomy STATES OVERACTIVE THYROID-- NO ISSUES SINCE AGE 78 AND NO MEDS   Hyperlipidemia    Hypertension    Thyroid disease       PAST SURGICAL HISTORY: Past Surgical History:  Procedure Laterality Date   CIRCUMCISION  01/04/2012   Procedure: CIRCUMCISION ADULT;  Surgeon: Kathi LudwigSigmund I Tannenbaum, MD;  [facility];  Service: Urology;  Laterality: N/A;   COLONOSCOPY  04/08/2020   Vito Cirigliano   THYROIDECTOMY, PARTIAL  AGE 78   OVERACTIVE THYROID    FAMILY HISTORY:  Family History  Problem Relation Age of Onset   Cancer Mother    COPD Father    Heart attack Father    Cancer Sister    Cancer Brother    Cancer Sister    Cancer Brother    Colon cancer Neg Hx    Colon polyps Neg Hx    Esophageal cancer Neg Hx    Rectal cancer Neg Hx    Stomach cancer Neg Hx     SOCIAL HISTORY:  Social History   Socioeconomic History   Marital status: Married    Spouse name: Not on file   Number of children: Not on  file   Years of education: Not on file   Highest education level: Not on file  Occupational History   Not on file  Tobacco Use   Smoking status: Never   Smokeless tobacco: Current    Types: Chew   Tobacco comments:    started chewing tobacco off and on since 1970  Vaping Use   Vaping Use: Never used  Substance and Sexual Activity   Alcohol use: Never   Drug use: No   Sexual activity: Not Currently  Other Topics Concern   Not on file  Social History Narrative   Not on file   Social Determinants of Health   Financial Resource Strain: Low Risk  (10/08/2021)   Overall Financial Resource Strain (CARDIA)    Difficulty of Paying Living Expenses: Not  hard at all  Food Insecurity: No Food Insecurity (07/17/2022)   Hunger Vital Sign    Worried About Running Out of Food in the Last Year: Never true    Ran Out of Food in the Last Year: Never true  Transportation Needs: No Transportation Needs (07/17/2022)   PRAPARE - Administrator, Civil Service (Medical): No    Lack of Transportation (Non-Medical): No  Physical Activity: Inactive (10/08/2021)   Exercise Vital Sign    Days of Exercise per Week: 0 days    Minutes of Exercise per Session: 0 min  Stress: No Stress Concern Present (10/08/2021)   [employer] of Occupational Health - Occupational Stress Questionnaire    Feeling of Stress : Not at all  Social Connections: Socially Isolated (10/08/2021)   Social Connection and Isolation Panel [NHANES]    Frequency of Communication with Friends and Family: More than three times a week    Frequency of Social Gatherings with Friends and Family: More than three times a week    Attends Religious Services: Never    Database administrator or Organizations: No    Attends Banker Meetings: Never    Marital Status: Divorced  Catering manager Violence: Not At Risk (07/17/2022)   Humiliation, Afraid, Rape, and Kick questionnaire    Fear of Current or Ex-Partner: No    Emotionally Abused: No    Physically Abused: No    Sexually Abused: No    ALLERGIES: Testosterone  MEDICATIONS:  Current Outpatient Medications  Medication Sig Dispense Refill   aspirin EC 81 MG tablet Take 81 mg by mouth daily. Swallow whole.     atorvastatin (LIPITOR) 20 MG tablet Take 1 tablet (20 mg total) by mouth daily. 90 tablet 1   cetirizine (ZYRTEC) 10 MG tablet Take 10 mg by mouth daily.     levothyroxine (SYNTHROID) 88 MCG tablet Take 1 tablet (88 mcg total) by mouth daily. 90 tablet 0   lisinopril-hydrochlorothiazide (ZESTORETIC) 20-25 MG tablet Take 1 tablet by mouth daily. 90 tablet 1   tamsulosin (FLOMAX) 0.4 MG CAPS capsule Take 1 capsule  by mouth daily.     No current facility-administered medications for this visit.    REVIEW OF SYSTEMS:  On review of systems, the patient reports that he is doing well overall. He denies any chest pain, shortness of breath, cough, fevers, chills, night sweats, unintended weight changes. He denies any bowel disturbances, and denies abdominal pain, nausea or vomiting. He denies any new musculoskeletal or joint aches or pains. His IPSS was 4, indicating mild urinary symptoms. His SHIM was 5, indicating he has severe erectile dysfunction. A complete review of systems is obtained  and is otherwise negative.     PHYSICAL EXAM:  Wt Readings from Last 3 Encounters:  07/17/22 299 lb 3.2 oz (135.7 kg)  10/08/21 287 lb (130.2 kg)  05/09/21 287 lb (130.2 kg)   Temp Readings from Last 3 Encounters:  07/17/22 97.6 F (36.4 C)  09/20/20 98 F (36.7 C)  04/08/20 97.7 F (36.5 C)   BP Readings from Last 3 Encounters:  07/17/22 (!) 119/56  05/09/21 118/60  09/20/20 124/80   Pulse Readings from Last 3 Encounters:  07/17/22 (!) 50  05/09/21 63  09/20/20 (!) 57    /10  In general this is a well appearing Caucasian male in no acute distress. He's alert and oriented x4 and appropriate throughout the examination. Cardiopulmonary assessment is negative for acute distress, and he exhibits normal effort.     KPS = 100  100 - Normal; no complaints; no evidence of disease. 90   - Able to carry on normal activity; minor signs or symptoms of disease. 80   - Normal activity with effort; some signs or symptoms of disease. 37   - Cares for self; unable to carry on normal activity or to do active work. 60   - Requires occasional assistance, but is able to care for most of his personal needs. 50   - Requires considerable assistance and frequent medical care. 40   - Disabled; requires special care and assistance. 30   - Severely disabled; hospital admission is indicated although death not imminent. 20   -  Very sick; hospital admission necessary; active supportive treatment necessary. 10   - Moribund; fatal processes progressing rapidly. 0     - Dead  Karnofsky DA, Abelmann WH, Craver LS and Burchenal [facility] [phone]) The use of the nitrogen mustards in the palliative treatment of carcinoma: with particular reference to bronchogenic carcinoma Cancer 1 634-56  LABORATORY DATA:  Lab Results  Component Value Date   WBC 4.7 05/09/2021   HGB 13.2 05/09/2021   HCT 39.6 05/09/2021   MCV 91.4 05/09/2021   PLT 299.0 05/09/2021   Lab Results  Component Value Date   NA 138 05/09/2021   K 3.6 05/09/2021   CL 100 05/09/2021   CO2 30 05/09/2021   Lab Results  Component Value Date   ALT 31 05/09/2021   AST 31 05/09/2021   ALKPHOS 58 05/09/2021   BILITOT 0.6 05/09/2021     RADIOGRAPHY: No results found.    IMPRESSION/PLAN: 1. 72 y.o. gentleman with Stage T1c adenocarcinoma of the prostate with Gleason score of 4+3, and PSA of 13.2.  The patient has elected to proceed with seed implant for treatment of his disease. We reviewed the risks, benefits, short and long-term effects associated with brachytherapy and discussed the role of SpaceOAR in reducing the rectal toxicity associated with radiotherapy.  He appears to have a good understanding of his disease and our treatment recommendations which are of curative intent.  He was encouraged to ask questions that were answered to his stated satisfaction. He has freely signed written consent to proceed today in the office and a copy of this document will be placed in his medical record. His procedure is tentatively scheduled for 09/04/2022 in collaboration with Dr. Retta Diones and we will see him back for his post-procedure visit approximately 3 weeks thereafter. We look forward to continuing to participate in his care. He knows that he is welcome to call with any questions or concerns at any time in the interim.  I  personally spent 30 minutes in this encounter  including chart review, reviewing radiological studies, meeting face-to-face with the patient, entering orders and completing documentation.    Marguarite Arbour, MMS, PA-C [facility] Radiation Oncology Physician Assistant Direct Dial: [phone]  Fax: [phone]

## 2022-07-30 ENCOUNTER — Encounter: Payer: Self-pay | Admitting: Urology

## 2022-07-30 ENCOUNTER — Ambulatory Visit
Admission: RE | Admit: 2022-07-30 | Discharge: 2022-07-30 | Disposition: A | Discharge status: Home / Self Care | Payer: Medicare HMO | Source: Ambulatory Visit | Attending: Urology | Admitting: Urology

## 2022-07-30 ENCOUNTER — Other Ambulatory Visit: Payer: Self-pay

## 2022-07-30 ENCOUNTER — Encounter (HOSPITAL_COMMUNITY)
Admission: RE | Admit: 2022-07-30 | Discharge: 2022-07-30 | Disposition: A | Discharge status: Home / Self Care | Payer: Medicare HMO | Source: Ambulatory Visit

## 2022-07-30 ENCOUNTER — Ambulatory Visit
Admission: RE | Admit: 2022-07-30 | Discharge: 2022-07-30 | Disposition: A | Discharge status: Home / Self Care | Payer: Medicare HMO | Source: Ambulatory Visit | Attending: Radiation Oncology | Admitting: Radiation Oncology

## 2022-07-30 VITALS — Ht 73.0 in | Wt 301.0 lb

## 2022-07-30 DIAGNOSIS — R001 Bradycardia, unspecified: Secondary | ICD-10-CM | POA: Insufficient documentation

## 2022-07-30 DIAGNOSIS — Z01818 Encounter for other preprocedural examination: Secondary | ICD-10-CM

## 2022-07-30 DIAGNOSIS — C61 Malignant neoplasm of prostate: Secondary | ICD-10-CM | POA: Insufficient documentation

## 2022-07-30 DIAGNOSIS — Z191 Hormone sensitive malignancy status: Secondary | ICD-10-CM | POA: Diagnosis not present

## 2022-07-30 NOTE — Progress Notes (Addendum)
Pre-seed nursing interview for a diagnosis of 72 y.o. gentleman with Stage T1c adenocarcinoma of the prostate with Gleason score of 4+3, and PSA of 13.2.  Patient identity verified. Patient reports doing well. No other issues conveyed at this time.  Meaningful use complete. Urinary Management medication(s)- Tamsulosin Urology appointment date- 09/04/2022-7:30am, with Dr. Retta Diones at [facility] [place]  Ht 6\' 1"  (1.854 m)   Wt (!) 301 lb (136.5 kg)   BMI 39.71 kg/m   This concludes the interview.   Isaac Favors, LPN

## 2022-08-04 ENCOUNTER — Other Ambulatory Visit: Payer: Self-pay | Admitting: Urology

## 2022-08-13 ENCOUNTER — Ambulatory Visit: Payer: Self-pay | Admitting: Urology

## 2022-08-13 ENCOUNTER — Ambulatory Visit: Payer: Medicare HMO | Admitting: Radiation Oncology

## 2022-08-14 ENCOUNTER — Other Ambulatory Visit: Payer: Self-pay | Admitting: Family Medicine

## 2022-08-14 DIAGNOSIS — E890 Postprocedural hypothyroidism: Secondary | ICD-10-CM

## 2022-08-19 DIAGNOSIS — Z01818 Encounter for other preprocedural examination: Secondary | ICD-10-CM | POA: Diagnosis not present

## 2022-08-19 DIAGNOSIS — C61 Malignant neoplasm of prostate: Secondary | ICD-10-CM | POA: Diagnosis not present

## 2022-08-24 ENCOUNTER — Encounter (HOSPITAL_BASED_OUTPATIENT_CLINIC_OR_DEPARTMENT_OTHER): Payer: Self-pay | Admitting: Urology

## 2022-08-24 NOTE — Progress Notes (Signed)
Spoke w/ via phone for pre-op interview--- Isaac Carpenter needs dos----  ISTAT             Lab results------Current EKG in Epic dated 07/30/22. COVID test -----patient states asymptomatic no test needed Arrive at -------0530 NPO after MN NO Solid Food.   Med rec completed Medications to take morning of surgery -----Synthroid Diabetic medication ----- Patient instructed no nail polish to be worn day of surgery Patient instructed to bring photo id and insurance card day of surgery Patient aware to have Driver (ride ) / caregiver Lajoyce Lauber   for 24 hours after surgery  Patient Special Instructions ----- Pre-Op special Instructions -----FLEETS enema night before surgery. Patient verbalized understanding. Patient verbalized understanding of instructions that were given at this phone interview. Patient denies shortness of breath, chest pain, fever, cough at this phone interview.

## 2022-08-28 NOTE — Progress Notes (Signed)
RN spoke with patient to assess any needs or questions prior to his brachytherapy on 09/04/2022.   No needs at this time.  RN encouraged patient to call with any questions that may arise.

## 2022-09-03 ENCOUNTER — Telehealth: Payer: Self-pay | Admitting: *Deleted

## 2022-09-03 NOTE — Telephone Encounter (Signed)
Called patient to remind of procedure for 09-04-22, spoke with patient and he is aware of this procedure

## 2022-09-04 ENCOUNTER — Encounter (HOSPITAL_BASED_OUTPATIENT_CLINIC_OR_DEPARTMENT_OTHER): Payer: Self-pay | Admitting: Urology

## 2022-09-04 ENCOUNTER — Ambulatory Visit (HOSPITAL_BASED_OUTPATIENT_CLINIC_OR_DEPARTMENT_OTHER)
Admission: RE | Admit: 2022-09-04 | Discharge: 2022-09-04 | Disposition: A | Discharge status: Home / Self Care | Payer: Medicare HMO | Attending: Urology | Admitting: Urology

## 2022-09-04 ENCOUNTER — Ambulatory Visit (HOSPITAL_BASED_OUTPATIENT_CLINIC_OR_DEPARTMENT_OTHER): Payer: Medicare HMO | Admitting: Anesthesiology

## 2022-09-04 ENCOUNTER — Encounter (HOSPITAL_BASED_OUTPATIENT_CLINIC_OR_DEPARTMENT_OTHER)
Admission: RE | Disposition: A | Discharge status: Home / Self Care | Payer: Self-pay | Source: Home / Self Care | Attending: Urology

## 2022-09-04 ENCOUNTER — Ambulatory Visit (HOSPITAL_COMMUNITY): Payer: Medicare HMO

## 2022-09-04 DIAGNOSIS — E785 Hyperlipidemia, unspecified: Secondary | ICD-10-CM | POA: Diagnosis not present

## 2022-09-04 DIAGNOSIS — G4730 Sleep apnea, unspecified: Secondary | ICD-10-CM | POA: Diagnosis not present

## 2022-09-04 DIAGNOSIS — I10 Essential (primary) hypertension: Secondary | ICD-10-CM

## 2022-09-04 DIAGNOSIS — C61 Malignant neoplasm of prostate: Secondary | ICD-10-CM | POA: Diagnosis not present

## 2022-09-04 DIAGNOSIS — E890 Postprocedural hypothyroidism: Secondary | ICD-10-CM | POA: Insufficient documentation

## 2022-09-04 DIAGNOSIS — E039 Hypothyroidism, unspecified: Secondary | ICD-10-CM

## 2022-09-04 DIAGNOSIS — Z191 Hormone sensitive malignancy status: Secondary | ICD-10-CM | POA: Diagnosis not present

## 2022-09-04 DIAGNOSIS — M1990 Unspecified osteoarthritis, unspecified site: Secondary | ICD-10-CM | POA: Insufficient documentation

## 2022-09-04 DIAGNOSIS — Z01818 Encounter for other preprocedural examination: Secondary | ICD-10-CM

## 2022-09-04 HISTORY — DX: Hypothyroidism, unspecified: E03.9

## 2022-09-04 HISTORY — PX: RADIOACTIVE SEED IMPLANT: SHX5150

## 2022-09-04 HISTORY — PX: SPACE OAR INSTILLATION: SHX6769

## 2022-09-04 HISTORY — DX: Sleep apnea, unspecified: G47.30

## 2022-09-04 HISTORY — DX: Malignant (primary) neoplasm, unspecified: C80.1

## 2022-09-04 HISTORY — PX: CYSTOSCOPY: SHX5120

## 2022-09-04 LAB — POCT I-STAT, CHEM 8
BUN: 24 mg/dL — ABNORMAL HIGH (ref 8–23)
Calcium, Ion: 1.24 mmol/L (ref 1.15–1.40)
Chloride: 99 mmol/L (ref 98–111)
Creatinine, Ser: 0.8 mg/dL (ref 0.61–1.24)
Glucose, Bld: 140 mg/dL — ABNORMAL HIGH (ref 70–99)
HCT: 42 % (ref 39.0–52.0)
Hemoglobin: 14.3 g/dL (ref 13.0–17.0)
Potassium: 3.4 mmol/L — ABNORMAL LOW (ref 3.5–5.1)
Sodium: 140 mmol/L (ref 135–145)
TCO2: 30 mmol/L (ref 22–32)

## 2022-09-04 SURGERY — INSERTION, RADIATION SOURCE, PROSTATE
Anesthesia: General | Site: Prostate

## 2022-09-04 MED ORDER — FLEET ENEMA 7-19 GM/118ML RE ENEM: 1.0000 | ENEMA | Freq: Once | RECTAL | Status: DC

## 2022-09-04 MED ORDER — STERILE WATER FOR IRRIGATION IR SOLN
Status: DC | PRN
  Administered 2022-09-04: 7 mL

## 2022-09-04 MED ORDER — PROPOFOL 10 MG/ML IV BOLUS
INTRAVENOUS | Status: DC | PRN
  Administered 2022-09-04: 200 mg via INTRAVENOUS

## 2022-09-04 MED ORDER — FENTANYL CITRATE (PF) 100 MCG/2ML IJ SOLN
INTRAMUSCULAR | Status: DC | PRN
  Administered 2022-09-04 (×2): 50 ug via INTRAVENOUS

## 2022-09-04 MED ORDER — EPHEDRINE SULFATE-NACL 50-0.9 MG/10ML-% IV SOSY
PREFILLED_SYRINGE | INTRAVENOUS | Status: DC | PRN
  Administered 2022-09-04: 15 mg via INTRAVENOUS

## 2022-09-04 MED ORDER — FENTANYL CITRATE (PF) 100 MCG/2ML IJ SOLN
INTRAMUSCULAR | Status: AC
  Filled 2022-09-04: qty 2

## 2022-09-04 MED ORDER — MIDAZOLAM HCL 2 MG/2ML IJ SOLN
INTRAMUSCULAR | Status: AC
  Filled 2022-09-04: qty 2

## 2022-09-04 MED ORDER — SODIUM CHLORIDE 0.9 % IR SOLN
Status: DC | PRN
  Administered 2022-09-04: 1000 mL via INTRAVESICAL

## 2022-09-04 MED ORDER — SUCCINYLCHOLINE CHLORIDE 200 MG/10ML IV SOSY
PREFILLED_SYRINGE | INTRAVENOUS | Status: DC | PRN
  Administered 2022-09-04: 120 mg via INTRAVENOUS

## 2022-09-04 MED ORDER — IOHEXOL 300 MG/ML  SOLN
INTRAMUSCULAR | Status: DC | PRN
  Administered 2022-09-04: 3 mL

## 2022-09-04 MED ORDER — OXYCODONE HCL 5 MG PO TABS: 5.0000 mg | ORAL_TABLET | Freq: Once | ORAL | Status: DC | PRN

## 2022-09-04 MED ORDER — ONDANSETRON HCL 4 MG/2ML IJ SOLN
INTRAMUSCULAR | Status: DC | PRN
  Administered 2022-09-04: 4 mg via INTRAVENOUS

## 2022-09-04 MED ORDER — PROPOFOL 10 MG/ML IV BOLUS
INTRAVENOUS | Status: AC
  Filled 2022-09-04: qty 20

## 2022-09-04 MED ORDER — FENTANYL CITRATE (PF) 100 MCG/2ML IJ SOLN: 25.0000 ug | INTRAMUSCULAR | Status: DC | PRN

## 2022-09-04 MED ORDER — OXYCODONE HCL 5 MG/5ML PO SOLN: 5.0000 mg | Freq: Once | ORAL | Status: DC | PRN

## 2022-09-04 MED ORDER — SUGAMMADEX SODIUM 200 MG/2ML IV SOLN
INTRAVENOUS | Status: DC | PRN
  Administered 2022-09-04: 300 mg via INTRAVENOUS

## 2022-09-04 MED ORDER — SODIUM CHLORIDE (PF) 0.9 % IJ SOLN
INTRAMUSCULAR | Status: DC | PRN
  Administered 2022-09-04: 10 mL

## 2022-09-04 MED ORDER — ROCURONIUM BROMIDE 10 MG/ML (PF) SYRINGE
PREFILLED_SYRINGE | INTRAVENOUS | Status: DC | PRN
  Administered 2022-09-04: 50 mg via INTRAVENOUS

## 2022-09-04 MED ORDER — LACTATED RINGERS IV SOLN: INTRAVENOUS | Status: DC

## 2022-09-04 MED ORDER — DEXAMETHASONE SODIUM PHOSPHATE 10 MG/ML IJ SOLN
INTRAMUSCULAR | Status: DC | PRN
  Administered 2022-09-04 (×2): 5 mg via INTRAVENOUS

## 2022-09-04 MED ORDER — LIDOCAINE HCL (PF) 2 % IJ SOLN
INTRAMUSCULAR | Status: AC
  Filled 2022-09-04: qty 5

## 2022-09-04 MED ORDER — CEFAZOLIN SODIUM-DEXTROSE 2-4 GM/100ML-% IV SOLN
2.0000 g | Freq: Once | INTRAVENOUS | Status: AC
  Administered 2022-09-04: 2 g via INTRAVENOUS

## 2022-09-04 MED ORDER — CEFAZOLIN SODIUM-DEXTROSE 2-4 GM/100ML-% IV SOLN
INTRAVENOUS | Status: AC
  Filled 2022-09-04: qty 100

## 2022-09-04 MED ORDER — ONDANSETRON HCL 4 MG/2ML IJ SOLN: 4.0000 mg | Freq: Four times a day (QID) | INTRAMUSCULAR | Status: DC | PRN

## 2022-09-04 MED ORDER — LIDOCAINE 2% (20 MG/ML) 5 ML SYRINGE
INTRAMUSCULAR | Status: DC | PRN
  Administered 2022-09-04: 60 mg via INTRAVENOUS

## 2022-09-04 MED ORDER — ROCURONIUM BROMIDE 10 MG/ML (PF) SYRINGE
PREFILLED_SYRINGE | INTRAVENOUS | Status: AC
  Filled 2022-09-04: qty 10

## 2022-09-04 SURGICAL SUPPLY — 47 items
BAG DRN RND TRDRP ANRFLXCHMBR (UROLOGICAL SUPPLIES) ×3
BAG URINE DRAIN 2000ML AR STRL (UROLOGICAL SUPPLIES) ×3 IMPLANT
BLADE CLIPPER SENSICLIP SURGIC (BLADE) ×3 IMPLANT
BLANKET WARM UPPER BOD BAIR (MISCELLANEOUS) ×3 IMPLANT
CATH FOLEY 2WAY SLVR  5CC 16FR (CATHETERS) ×3
CATH FOLEY 2WAY SLVR 5CC 16FR (CATHETERS) ×3 IMPLANT
CATH ROBINSON RED A/P 16FR (CATHETERS) IMPLANT
CATH ROBINSON RED A/P 20FR (CATHETERS) ×3 IMPLANT
CLOTH BEACON ORANGE TIMEOUT ST (SAFETY) ×3 IMPLANT
CNTNR URN SCR LID CUP LEK RST (MISCELLANEOUS) ×3 IMPLANT
CONT SPEC 4OZ STRL OR WHT (MISCELLANEOUS) ×3
COVER BACK TABLE 60X90IN (DRAPES) ×3 IMPLANT
COVER MAYO STAND STRL (DRAPES) ×3 IMPLANT
DRSG TEGADERM 4X4.75 (GAUZE/BANDAGES/DRESSINGS) ×3 IMPLANT
DRSG TEGADERM 8X12 (GAUZE/BANDAGES/DRESSINGS) ×3 IMPLANT
GAUZE SPONGE 4X4 12PLY STRL (GAUZE/BANDAGES/DRESSINGS) IMPLANT
GEL ULTRASOUND 20GR AQUASONIC (MISCELLANEOUS) ×6 IMPLANT
GLOVE BIO SURGEON STRL SZ 6.5 (GLOVE) IMPLANT
GLOVE BIO SURGEON STRL SZ7.5 (GLOVE) IMPLANT
GLOVE BIO SURGEON STRL SZ8 (GLOVE) ×6 IMPLANT
GLOVE BIOGEL PI IND STRL 6.5 (GLOVE) IMPLANT
GLOVE SURG ORTHO 8.5 STRL (GLOVE) ×3 IMPLANT
GOWN STRL REUS W/TWL XL LVL3 (GOWN DISPOSABLE) ×3 IMPLANT
GRID BRACH TEMP 18GA 2.8X3X.75 (MISCELLANEOUS) ×3 IMPLANT
HOLDER FOLEY CATH W/STRAP (MISCELLANEOUS) ×3 IMPLANT
IMPL SPACEOAR VUE SYSTEM (Spacer) ×3 IMPLANT
IMPLANT SPACEOAR VUE SYSTEM (Spacer) ×3 IMPLANT
IV NS 1000ML (IV SOLUTION) ×3
IV NS 1000ML BAXH (IV SOLUTION) ×3 IMPLANT
KIT TURNOVER CYSTO (KITS) ×3 IMPLANT
NDL BRACHY 18G 5PK (NEEDLE) ×12 IMPLANT
NDL BRACHY 18G SINGLE (NEEDLE) IMPLANT
NDL PK MORGANSTERN STABILIZ (NEEDLE) ×3 IMPLANT
NEEDLE BRACHY 18G 5PK (NEEDLE) ×12 IMPLANT
NEEDLE BRACHY 18G SINGLE (NEEDLE) IMPLANT
NEEDLE PK MORGANSTERN STABILIZ (NEEDLE) ×3 IMPLANT
PACK CYSTO (CUSTOM PROCEDURE TRAY) ×3 IMPLANT
SHEATH ULTRASOUND LF (SHEATH) IMPLANT
SHEATH ULTRASOUND LTX NONSTRL (SHEATH) IMPLANT
SLEEVE SCD COMPRESS KNEE MED (STOCKING) ×3 IMPLANT
SUT BONE WAX W31G (SUTURE) IMPLANT
SYR 10ML LL (SYRINGE) ×3 IMPLANT
SYR CONTROL 10ML LL (SYRINGE) ×3 IMPLANT
TOWEL OR 17X24 6PK STRL BLUE (TOWEL DISPOSABLE) ×3 IMPLANT
UNDERPAD 30X36 HEAVY ABSORB (UNDERPADS AND DIAPERS) ×6 IMPLANT
WATER STERILE IRR 500ML POUR (IV SOLUTION) ×3 IMPLANT
brachysource I-125 seeds IMPLANT

## 2022-09-04 NOTE — Op Note (Signed)
Preoperative diagnosis: Clinical stage TI C adenocarcinoma the prostate   Postoperative diagnosis: Same   Procedure: I-125 prostate seed implantation, SpaceOAR placement, flexible cystoscopy  Surgeon: Bertram Millard. Elanor Cale M.D.  Radiation Oncologist: Margaretmary Dys, M.D.  Anesthesia: Gen.   Indications: Patient  was diagnosed with clinical stage TI C prostate cancer. We had extensive discussion with him about treatment options versus. He elected to proceed with seed implantation. He underwent consultation my office as well as with Dr. Kathrynn Running. He appeared to understand the advantages disadvantages potential risks of this treatment option. Full informed consent has been obtained.   Technique and findings: Patient was brought the operating room where he had successful induction of general anesthesia. He was placed in dorso-lithotomy position and prepped and draped in usual manner. Appropriate surgical timeout was performed. Radiation oncology department placed a transrectal ultrasound probe anchoring stand. Foley catheter with contrast in the balloon was inserted without difficulty. Anchoring needles were placed within the prostate. Rectal tube was placed. Real-time contouring of the urethra prostate and rectum were performed and the dosing parameters were established. Targeted dose was 145 Im.  I was then called  to the operating suite suite for placement of the needles. A second timeout was performed. All needle passage was done with real-time transrectal ultrasound guidance with the sagittal plane. A total of 17 needles were placed.  57 active seeds were implanted.  I then proceeded with placement of SpaceOAR by introducing a needle with the bevel angled inferiorly approximately 2 cm superior to the anus. This was angled downward and under direct ultrasound was placed within the space between the prostatic capsule and rectum. This was confirmed with a small amount of sterile saline injected and  this was performed under direct ultrasound. I then attached the SpaceOAR to the needle and injected this in the space between the prostate and rectum with good placement noted. The Foley catheter was removed and flexible cystoscopy was performed. There were no urothelial lesions. UReteral orifices normal. THere were no seeds outside the prostate.  The patient was brought to recovery room in stable condition, having tolerated the procedure well..   57

## 2022-09-04 NOTE — Transfer of Care (Signed)
Immediate Anesthesia Transfer of Care Note  Patient: CHRISTAIN MOURE  Procedure(s) Performed: Procedure(s) (LRB): RADIOACTIVE SEED IMPLANT/BRACHYTHERAPY IMPLANT (N/A) SPACE OAR INSTILLATION (N/A) CYSTOSCOPY  Patient Location: PACU  Anesthesia Type: General  Level of Consciousness: awake, oriented, sedated and patient cooperative  Airway & Oxygen Therapy: Patient Spontanous Breathing and Patient connected to face mask oxygen  Post-op Assessment: Report given to PACU RN and Post -op Vital signs reviewed and stable  Post vital signs: Reviewed and stable  Complications: No apparent anesthesia complications Last Vitals:  Vitals Value Taken Time  BP 160/72 09/04/22 1000  Temp    Pulse 64 09/04/22 1003  Resp 21 09/04/22 1003  SpO2 100 % 09/04/22 1003  Vitals shown include unvalidated device data.  Last Pain:  Vitals:   09/04/22 0553  TempSrc: Oral  PainSc: 0-No pain      Patients Stated Pain Goal: 5 (09/04/22 0553)  Complications:  Encounter Notable Events  Notable Event Outcome Phase Comment  Difficult to intubate - expected  Intraprocedure Filed from anesthesia note documentation.

## 2022-09-04 NOTE — Progress Notes (Signed)
Radiation Oncology         [phone]) [phone] ________________________________  Name: Isaac Carpenter MRN: [id number]  Date: 09/04/2022  DOB: 1950/06/01       Prostate Seed Implant  NF:[id number], Isaac Expose, MD  Marcine Matar, MD  DIAGNOSIS:   72 y.o. gentleman with Stage T1c adenocarcinoma of the prostate with Gleason score of 4+3, and PSA of 13.2.   Oncology History  Malignant neoplasm of prostate (HCC)  05/13/2022 Cancer Staging   Staging form: Prostate, AJCC 8th Edition - Clinical stage from 05/13/2022: Stage IIC (cT1c, cN0, cM0, PSA: 13.2, Grade Group: 3) - Signed by Marcello Fennel, PA-C on 07/17/2022 Histopathologic type: Adenocarcinoma, NOS Stage prefix: Initial diagnosis Prostate specific antigen (PSA) range: 10 to 19 Gleason primary pattern: 4 Gleason secondary pattern: 3 Gleason score: 7 Histologic grading system: 5 grade system Number of biopsy cores examined: 12 Number of biopsy cores positive: 4 Location of positive needle core biopsies: Both sides   07/17/2022 Initial Diagnosis   Malignant neoplasm of prostate (HCC)       ICD-10-CM   1. Pre-op testing  Z01.818 CBG per Guidelines for Diabetes Management for Patients Undergoing Surgery ([facility], [facility], and [facility] only)    CBG per protocol    I-Stat, Chem 8 on day of surgery per protocol    CBG per Guidelines for Diabetes Management for Patients Undergoing Surgery ([facility], [facility], and [facility] only)    CBG per protocol    I-Stat, Chem 8 on day of surgery per protocol    CANCELED: EKG 12 lead per protocol      PROCEDURE: Insertion of radioactive I-125 seeds into the prostate gland.  RADIATION DOSE: 145 Gy, definitive therapy.  TECHNIQUE: Isaac Carpenter was brought to the operating room with the urologist. He was placed in the dorsolithotomy position. He was catheterized and a rectal tube was inserted. The perineum was shaved, prepped and draped. The ultrasound probe was then introduced by me into the rectum to see the prostate  gland.  TREATMENT DEVICE: I attached the needle grid to the ultrasound probe stand and anchor needles were placed.  3D PLANNING: The prostate was imaged in 3D using a sagittal sweep of the prostate probe. These images were transferred to the planning computer. There, the prostate, urethra and rectum were defined on each axial reconstructed image. Then, the software created an optimized 3D plan and a few seed positions were adjusted. The quality of the plan was reviewed using [facility] information for the target and the following two organs at risk:  Urethra and Rectum.  Then the accepted plan was printed and handed off to the radiation therapist.  Under my supervision, the custom loading of the seeds and spacers was carried out using the quick loader.  These pre-loaded needles were then placed into the needle holder.Marland Kitchen  PROSTATE VOLUME STUDY:  Using transrectal ultrasound the volume of the prostate was verified to be 44.9 cc.  SPECIAL TREATMENT PROCEDURE/SUPERVISION AND HANDLING: The pre-loaded needles were then delivered by the urologist under sagittal guidance. A total of 17 needles were used to deposit 57 seeds in the prostate gland. The individual seed activity was 0.544 mCi.  SpaceOAR:  Yes  COMPLEX SIMULATION: At the end of the procedure, an anterior radiograph of the pelvis was obtained to document seed positioning and count. Cystoscopy was performed by the urologist to check the urethra and bladder.  MICRODOSIMETRY: At the end of the procedure, the patient was emitting 0.03 mR/hr at 1 meter. Accordingly,  he was considered safe for hospital discharge.  PLAN: The patient will return to the radiation oncology clinic for post implant CT dosimetry in three weeks.   ________________________________  Artist Pais Kathrynn Running, M.D.

## 2022-09-04 NOTE — Discharge Instructions (Addendum)
Radioactive Seed Implant Home Care Instructions   Activity:    Rest for the remainder of the day.  Do not drive or operate equipment today.  You may resume normal activities in a few days as instructed by your physician, without risk of harmful radiation exposure to those around you, provided you follow the time and distance precautions on the Radiation Oncology Instruction Sheet.  Meals: Drink plenty of lipuids and eat light foods, such as gelatin or soup this evening .  You may return to normal meal plan tomorrow.  Return To Work: You may return to work as instructed by your physician.  Special Instruction:   If any seeds are found, use tweezers to pick up seeds and place in a glass container of any kind and bring to your physician's office.  Call your physician if any of these symptoms occur:   Persistent or heavy bleeding  Urine stream diminishes or stops completely after catheter is removed  Fever equal to or greater than 101 degrees F  Cloudy urine with a strong foul odor  Severe pain  You may feel some burning pain and/or hesitancy when you urinate after the catheter is removed.  These symptoms may increase over the next few weeks, but should diminish within forur to six weeks.  Applying moist heat to the lower abdomen or a hot tub bath may help relieve the pain.  If the discomfort becomes severe, please call your physician for additional medications.  PROSTATE CANCER TREATMENT WITH RADIOACTIVE IODINE-125 SEED IMPLANT  This instruction sheet is intended to discuss implantation of Iodine-125 seeds as treatment for cancer of the prostate. It will explain in detail what you may expect from this treatment and what precautions are necessary as a result of the treatment. Iodine-125 emits a relatively low energy radiation. The radioactive seeds are surgically implanted directly into the prostate gland. Most of the radiation is contained within the prostate gland. A very small amount  is present outside the body.The precautions that we ask you to take are to ensure that those around you are protected from unnecessary radiation. The principles of radiation safety that you need to understand are:  DISTANCE: The further a person is from the radioactive implant the less radiation they will be receiving. The amount of radiation received falls off quite rapidly with distance. More specific guidelines are given in the table on the last page.  TIME: The amount of radiation a person is exposed to is directly proportional to the amount of time that is spent in close proximity to the radioactive implant. Very little radiation will be received during short periods. See the table on the last page for more specific guideline.  CHILDREN UNDER AGE 18 Children should not be allowed to sit on your lap or otherwise be in very close contact for more than a few minutes for the first 6-8 weeks following the implant. You may affectionately greet (hug/kiss) a child for a short period of time, but remember, the longer you are in close proximity with that child the more radiation they are being exposed to. At a distance of 6 feet there is no limit to the length of time you may spend together. See specific guidelines on the last page.  PREGNANT OR POSSIBLY PREGNANT WOMEN Pregnant women should avoid prolonged close physical contact with you for the first 6-8 weeks after implant. At a distance of 6 feet there is no limit to the length of time you may spend together. Pregnant women   or possibly pregnant women can safely be in close contact with you for a limited period of time. See the last page for guidelines.  FAMILY RELATIONS You may sleep in the same bed as your partner (provided she is not pregnant or under the age of 45). Sexual intercourse, using a condom, may be resumed 2 weeks after the implant. Your semen may be discolored, dark brown or black. This is normal and is the result of bleeding that may have  occurred during the implant. After 3-4 weeks it will not be necessary to use a condom.  DAILY ACTIVITIES You may resume normal activities in a few days (example: work, shopping, church) without the risk of harmful radiation exposure to those around you provided you keep in mind the time and distance precautions. Objects that you touch or item that you use do not become radioactive. Linens, clothing, tableware, and dishes may be used by other persons without special precautions. Your bodily wastes (urine and stool) are not radioactive.  SPECIAL PRECAUTIONS It is possible to lose implanted Iodine-125 seed(s) through urination. Although it is possible to pass seeds indefinitely, it is most likely to occur immediately after catheter removal. To prevent this from happening the catheter that was in place during the implant procedure is removed immediately after the implant and a cystoscopy procedure is performed. The process of removing the catheter and the cystoscopy procedure should dislodge and remove any seeds that are not firmly imbedded in the prostate tissue. However, you should watch for seeds if/when you remove your catheter at home. The seeds are silver colored and the size of a grain of rice. In the unlikely event that a seed is seen after urination, simply flush the seed down the toilet. The seed should not be handled with your fingers, not even with a glove or napkin. A spoon or tweezers can be used to pick up a seed. The Radiation Oncology department is open Monday - Friday from 8:00 am to 5:30 pm with a Radiation Oncologist on call at all times. He or she may be reached by calling [phone]. If you are to be hospitalized or if death should occur, your family should notify the Radiation Safety officer.  SIDE EFFECTS There are very few side effects associate with the implant procedure. Minor burning with urination, weak stream, hesitancy, intermittency, frequency, mild pain or feeling unable to  pass your urine freely are common and usually stop in one to four months. If these symptoms are extremely uncomfortable, contact your physician.  RADIATION SAFETY GUIDELINES PROSTATE CANCER TREATMENT WITH RADIOACTIVE IODINE-125 SEED IMPLANT  The following guidelines will limit exposure to less than naturally occurring background radiation.  PERSONS AGE 18-45 (if able to become pregnant)  FOR 8 WEEKS FOLLOWING IMPLANT  At a distance of 1 foot: limit time to less than 2 hours/week At a distance of 3 feet: limit time to 20 hours/week At a distance of 6 feet: no restrictions  AFTER 8 WEEKS No restrictions  CHILDREN UNDER AGE 18, PREGNANT WOMEN OR POSSIBLY PREGNANT WOMEN  FOR 8 WEEKS FOLLOWING IMPLANT At a distance of 1 foot: limit time to 10 minutes/week At a distance of 3 feet: limit time to 2 hours/week At a distance of 6 feet: no restrictions  AFTER 8 WEEKS No restrictions  PERSONS OVER THE AGE OF 45 AND DO NOT EXPECT TO HAVE ANY MORE CHILDREN No restrictions  Updated by [facility] in January 2020   Post Anesthesia Home Care Instructions  Activity: Get   plenty of rest for the remainder of the day. A responsible individual must stay with you for 24 hours following the procedure.  For the next 24 hours, DO NOT: -Drive a car -Operate machinery -Drink alcoholic beverages -Take any medication unless instructed by your physician -Make any legal decisions or sign important papers.  Meals: Start with liquid foods such as gelatin or soup. Progress to regular foods as tolerated. Avoid greasy, spicy, heavy foods. If nausea and/or vomiting occur, drink only clear liquids until the nausea and/or vomiting subsides. Call your physician if vomiting continues.  Special Instructions/Symptoms: Your throat may feel dry or sore from the anesthesia or the breathing tube placed in your throat during surgery. If this causes discomfort, gargle with warm salt water. The discomfort should disappear  within 24 hours.    

## 2022-09-04 NOTE — H&P (Signed)
H&P  Chief Complaint: PCa  History of Present Illness: 72 yo male here for I 125 brachytherapy/SpaceOAR for GG 3 PCa.  Past Medical History:  Diagnosis Date   Arthritis    Cancer (HCC)    History of partial thyroidectomy STATES OVERACTIVE THYROID-- NO ISSUES SINCE AGE 45 AND NO MEDS   Hyperlipidemia    Hypertension    Hypothyroidism    Sleep apnea    Per patient he tried CPAP but could not tolerate.   Thyroid disease     Past Surgical History:  Procedure Laterality Date   CIRCUMCISION  01/04/2012   Procedure: CIRCUMCISION ADULT;  Surgeon: Kathi Ludwig, MD;  Location: [facility];  Service: Urology;  Laterality: N/A;   COLONOSCOPY  04/08/2020   Vito Cirigliano   THYROIDECTOMY, PARTIAL  AGE 45   OVERACTIVE THYROID    Home Medications:    Allergies:  Allergies  Allergen Reactions   Testosterone Other (See Comments)    headache    Family History  Problem Relation Age of Onset   Cancer Mother    COPD Father    Heart attack Father    Cancer Sister    Cancer Brother    Cancer Sister    Cancer Brother    Colon cancer Neg Hx    Colon polyps Neg Hx    Esophageal cancer Neg Hx    Rectal cancer Neg Hx    Stomach cancer Neg Hx     Social History:  reports that he has never smoked. His smokeless tobacco use includes chew. He reports that he does not drink alcohol and does not use drugs.  ROS: A complete review of systems was performed.  All systems are negative except for pertinent findings as noted.  Physical Exam:  Vital signs in last 24 hours: BP (!) 171/61   Pulse (!) 51   Temp 97.7 F (36.5 C) (Oral)   Resp 18   Ht 6\' 1"  (1.854 m)   Wt 135.8 kg   SpO2 96%   BMI 39.49 kg/m  Constitutional:  Alert and oriented, No acute distress Cardiovascular: Regular rate  Respiratory: Normal respiratory effort Neurologic: Grossly intact, no focal deficits Psychiatric: Normal mood and affect  I have reviewed prior pt notes  I have reviewed  urinalysis results  I have independently reviewed prior imaging  I have reviewed prior PSA/path results    Impression/Assessment:  GG 3 PCa  Plan:  I 125 brachytherapy/SpaceOAR

## 2022-09-04 NOTE — Anesthesia Procedure Notes (Addendum)
Procedure Name: Intubation Date/Time: 09/04/2022 8:39 AM  Performed by: Francie Massing, CRNAPre-anesthesia Checklist: Patient identified, Emergency Drugs available, Suction available and Patient being monitored Patient Re-evaluated:Patient Re-evaluated prior to induction Oxygen Delivery Method: Circle system utilized Preoxygenation: Pre-oxygenation with 100% oxygen Induction Type: IV induction Laryngoscope Size: Mac, 3 and Glidescope Tube type: Oral Tube size: 7.5 mm Number of attempts: 1 Airway Equipment and Method: Stylet Placement Confirmation: ETT inserted through vocal cords under direct vision, positive ETCO2 and breath sounds checked- equal and bilateral Secured at: 22 cm Tube secured with: Tape Dental Injury: Teeth and Oropharynx as per pre-operative assessment  Difficulty Due To: Difficulty was anticipated Comments: Pt with full beard, poor dentition, large tongue and MP class 2 airway.  Proceeded directly with atraumatic glidescope intubation and 7.5 ETT easily placed.  Bilateral breath sounds and ETCO2 confirmed.

## 2022-09-04 NOTE — Anesthesia Preprocedure Evaluation (Signed)
Anesthesia Evaluation  Patient identified by MRN, date of birth, ID band Patient awake    Reviewed: Allergy & Precautions, H&P , NPO status , Patient's Chart, lab work & pertinent test results  Airway Mallampati: II   Neck ROM: full    Dental   Pulmonary sleep apnea    breath sounds clear to auscultation       Cardiovascular hypertension,  Rhythm:regular Rate:Normal     Neuro/Psych    GI/Hepatic   Endo/Other  Hypothyroidism    Renal/GU    Prostate CA    Musculoskeletal  (+) Arthritis ,    Abdominal   Peds  Hematology   Anesthesia Other Findings   Reproductive/Obstetrics                             Anesthesia Physical Anesthesia Plan  ASA: 2  Anesthesia Plan: General   Post-op Pain Management:    Induction: Intravenous  PONV Risk Score and Plan: 2 and Ondansetron, Dexamethasone, Midazolam and Treatment may vary due to age or medical condition  Airway Management Planned: Oral ETT  Additional Equipment:   Intra-op Plan:   Post-operative Plan:   Informed Consent: I have reviewed the patients History and Physical, chart, labs and discussed the procedure including the risks, benefits and alternatives for the proposed anesthesia with the patient or authorized representative who has indicated his/her understanding and acceptance.     Dental advisory given  Plan Discussed with: CRNA, Anesthesiologist and Surgeon  Anesthesia Plan Comments:        Anesthesia Quick Evaluation

## 2022-09-07 ENCOUNTER — Encounter (HOSPITAL_BASED_OUTPATIENT_CLINIC_OR_DEPARTMENT_OTHER): Payer: Self-pay | Admitting: Urology

## 2022-09-07 NOTE — Anesthesia Postprocedure Evaluation (Signed)
Anesthesia Post Note  Patient: Isaac Carpenter  Procedure(s) Performed: RADIOACTIVE SEED IMPLANT/BRACHYTHERAPY IMPLANT (Prostate) SPACE OAR INSTILLATION (Perineum) CYSTOSCOPY (Bladder)     Patient location during evaluation: PACU Anesthesia Type: General Level of consciousness: awake and alert Pain management: pain level controlled Vital Signs Assessment: post-procedure vital signs reviewed and stable Respiratory status: spontaneous breathing, nonlabored ventilation, respiratory function stable and patient connected to nasal cannula oxygen Cardiovascular status: blood pressure returned to baseline and stable Postop Assessment: no apparent nausea or vomiting Anesthetic complications: yes   Encounter Notable Events  Notable Event Outcome Phase Comment  Difficult to intubate - expected  Intraprocedure Filed from anesthesia note documentation.    Last Vitals:  Vitals:   09/04/22 1030 09/04/22 1100  BP: (!) 150/66 (!) 158/76  Pulse: 65 62  Resp: 12 14  Temp: (!) 36.3 C (!) 36.4 C  SpO2: 94% 98%    Last Pain:  Vitals:   09/04/22 1100  TempSrc:   PainSc: 0-No pain                 Ailee Pates S

## 2022-09-08 ENCOUNTER — Telehealth: Payer: Self-pay

## 2022-09-08 NOTE — Telephone Encounter (Signed)
Upon review of chart, Last OV 05/09/21.  Attempted to call pt to notify him that he needs CPE.  Mychart message sent as well

## 2022-09-10 NOTE — Progress Notes (Signed)
Patient was a RadOnc Consult on 07/17/22 for his stage T1c adenocarcinoma of the prostate with Gleason score of 4+3, and PSA of 13.2. Patient proceed with treatment recommendations of brachytherapy and had this on 09/04/22.   Patient is scheduled for a post seed sim on 09/24/22  and has his post op on 10/01/22 at [facility].

## 2022-09-16 ENCOUNTER — Other Ambulatory Visit: Payer: Self-pay | Admitting: Family Medicine

## 2022-09-16 DIAGNOSIS — E890 Postprocedural hypothyroidism: Secondary | ICD-10-CM

## 2022-09-18 ENCOUNTER — Other Ambulatory Visit: Payer: Self-pay | Admitting: Family Medicine

## 2022-09-18 DIAGNOSIS — I10 Essential (primary) hypertension: Secondary | ICD-10-CM

## 2022-09-22 ENCOUNTER — Other Ambulatory Visit: Payer: Self-pay | Admitting: Family Medicine

## 2022-09-22 DIAGNOSIS — E890 Postprocedural hypothyroidism: Secondary | ICD-10-CM

## 2022-09-23 ENCOUNTER — Telehealth: Payer: Self-pay | Admitting: *Deleted

## 2022-09-23 NOTE — Telephone Encounter (Signed)
Called patient to remind of pre-seed appts. for 09-24-22- arrival time- 1:30 pm @ [facility], spoke with patient and he is aware of these appts.

## 2022-09-23 NOTE — Telephone Encounter (Signed)
CORRECTION, PHONED PATIENT TO INFORM OF POST SEED APPTS. FOR 09-24-22, SPOKE WITH PATIENT AND HE IS AWARE OF THESE APPTS.

## 2022-09-24 ENCOUNTER — Emergency Department (HOSPITAL_COMMUNITY)
Admission: EM | Admit: 2022-09-24 | Discharge: 2022-09-24 | Disposition: A | Discharge status: Home / Self Care | Payer: Medicare HMO | Attending: Emergency Medicine | Admitting: Emergency Medicine

## 2022-09-24 ENCOUNTER — Encounter (HOSPITAL_COMMUNITY): Payer: Self-pay

## 2022-09-24 ENCOUNTER — Other Ambulatory Visit: Payer: Self-pay

## 2022-09-24 ENCOUNTER — Ambulatory Visit
Admission: RE | Admit: 2022-09-24 | Discharge: 2022-09-24 | Disposition: A | Discharge status: Home / Self Care | Payer: Medicare HMO | Source: Ambulatory Visit | Attending: Radiation Oncology | Admitting: Radiation Oncology

## 2022-09-24 ENCOUNTER — Ambulatory Visit
Admission: RE | Admit: 2022-09-24 | Discharge: 2022-09-24 | Disposition: A | Discharge status: Home / Self Care | Payer: Medicare HMO | Source: Ambulatory Visit | Attending: Urology | Admitting: Urology

## 2022-09-24 ENCOUNTER — Encounter: Payer: Self-pay | Admitting: Urology

## 2022-09-24 ENCOUNTER — Emergency Department (HOSPITAL_COMMUNITY): Payer: Medicare HMO

## 2022-09-24 VITALS — BP 111/54 | HR 57 | Temp 97.6°F | Resp 20 | Ht 73.0 in | Wt 293.0 lb

## 2022-09-24 DIAGNOSIS — N200 Calculus of kidney: Secondary | ICD-10-CM | POA: Diagnosis not present

## 2022-09-24 DIAGNOSIS — N490 Inflammatory disorders of seminal vesicle: Secondary | ICD-10-CM | POA: Insufficient documentation

## 2022-09-24 DIAGNOSIS — Z7982 Long term (current) use of aspirin: Secondary | ICD-10-CM | POA: Insufficient documentation

## 2022-09-24 DIAGNOSIS — C61 Malignant neoplasm of prostate: Secondary | ICD-10-CM | POA: Insufficient documentation

## 2022-09-24 DIAGNOSIS — E039 Hypothyroidism, unspecified: Secondary | ICD-10-CM | POA: Diagnosis not present

## 2022-09-24 DIAGNOSIS — Z8546 Personal history of malignant neoplasm of prostate: Secondary | ICD-10-CM | POA: Insufficient documentation

## 2022-09-24 DIAGNOSIS — I10 Essential (primary) hypertension: Secondary | ICD-10-CM | POA: Insufficient documentation

## 2022-09-24 DIAGNOSIS — Z79890 Hormone replacement therapy (postmenopausal): Secondary | ICD-10-CM | POA: Insufficient documentation

## 2022-09-24 DIAGNOSIS — R339 Retention of urine, unspecified: Secondary | ICD-10-CM | POA: Diagnosis present

## 2022-09-24 DIAGNOSIS — N390 Urinary tract infection, site not specified: Secondary | ICD-10-CM | POA: Diagnosis not present

## 2022-09-24 DIAGNOSIS — Z79899 Other long term (current) drug therapy: Secondary | ICD-10-CM | POA: Insufficient documentation

## 2022-09-24 DIAGNOSIS — N509 Disorder of male genital organs, unspecified: Secondary | ICD-10-CM

## 2022-09-24 DIAGNOSIS — E119 Type 2 diabetes mellitus without complications: Secondary | ICD-10-CM | POA: Insufficient documentation

## 2022-09-24 LAB — CBC WITH DIFFERENTIAL/PLATELET
Abs Immature Granulocytes: 0.01 10*3/uL (ref 0.00–0.07)
Basophils Absolute: 0 10*3/uL (ref 0.0–0.1)
Basophils Relative: 0 %
Eosinophils Absolute: 0.2 10*3/uL (ref 0.0–0.5)
Eosinophils Relative: 4 %
HCT: 38.4 % — ABNORMAL LOW (ref 39.0–52.0)
Hemoglobin: 12.6 g/dL — ABNORMAL LOW (ref 13.0–17.0)
Immature Granulocytes: 0 %
Lymphocytes Relative: 33 %
Lymphs Abs: 1.7 10*3/uL (ref 0.7–4.0)
MCH: 30.8 pg (ref 26.0–34.0)
MCHC: 32.8 g/dL (ref 30.0–36.0)
MCV: 93.9 fL (ref 80.0–100.0)
Monocytes Absolute: 0.5 10*3/uL (ref 0.1–1.0)
Monocytes Relative: 11 %
Neutro Abs: 2.6 10*3/uL (ref 1.7–7.7)
Neutrophils Relative %: 52 %
Platelets: 221 10*3/uL (ref 150–400)
RBC: 4.09 MIL/uL — ABNORMAL LOW (ref 4.22–5.81)
RDW: 13.9 % (ref 11.5–15.5)
WBC: 5 10*3/uL (ref 4.0–10.5)
nRBC: 0 % (ref 0.0–0.2)

## 2022-09-24 LAB — COMPREHENSIVE METABOLIC PANEL
ALT: 22 U/L (ref 0–44)
AST: 26 U/L (ref 15–41)
Albumin: 3.6 g/dL (ref 3.5–5.0)
Alkaline Phosphatase: 65 U/L (ref 38–126)
Anion gap: 8 (ref 5–15)
BUN: 24 mg/dL — ABNORMAL HIGH (ref 8–23)
CO2: 28 mmol/L (ref 22–32)
Calcium: 8.6 mg/dL — ABNORMAL LOW (ref 8.9–10.3)
Chloride: 102 mmol/L (ref 98–111)
Creatinine, Ser: 0.93 mg/dL (ref 0.61–1.24)
GFR, Estimated: >60 mL/min (ref >60)
Glucose, Bld: 106 mg/dL — ABNORMAL HIGH (ref 70–99)
Potassium: 3.2 mmol/L — ABNORMAL LOW (ref 3.5–5.1)
Sodium: 138 mmol/L (ref 135–145)
Total Bilirubin: 0.8 mg/dL (ref 0.3–1.2)
Total Protein: 6.9 g/dL (ref 6.5–8.1)

## 2022-09-24 LAB — URINALYSIS, W/ REFLEX TO CULTURE (INFECTION SUSPECTED)
Bilirubin Urine: NEGATIVE
Glucose, UA: NEGATIVE mg/dL
Ketones, ur: NEGATIVE mg/dL
Nitrite: NEGATIVE
Protein, ur: NEGATIVE mg/dL
Specific Gravity, Urine: 1.025 (ref 1.005–1.030)
pH: 5 (ref 5.0–8.0)

## 2022-09-24 MED ORDER — CEFDINIR 300 MG PO CAPS: 300.0000 mg | ORAL_CAPSULE | Freq: Two times a day (BID) | ORAL | 0 refills | Status: AC

## 2022-09-24 MED ORDER — CEFDINIR 300 MG PO CAPS
300.0000 mg | ORAL_CAPSULE | Freq: Once | ORAL | Status: AC
  Administered 2022-09-24: 300 mg via ORAL
  Filled 2022-09-24: qty 1

## 2022-09-24 NOTE — Discharge Instructions (Signed)
We evaluated you for your urinary symptoms.  We did not see any sign of a blockage on your ultrasound or CT scan.  Your CT scan showed an issue with your seminal vesicle which is likely related to your recent procedure.  We spoke with the urologist on-call Dr. Delanna Ahmadi and they do not think that this is a dangerous problem, and recommend following up with Dr. Retta Diones.  Your urinalysis did have some bacteria and white blood cells, it was very mild but given your recent procedure we will go ahead and give you antibiotics.  We have sent a urine culture to see if bacteria grows from your urine sample.  Please follow-up closely with Dr. Retta Diones.  If you have any new symptoms such as inability to urinate, fevers or chills, back pain, abdominal pain, vomiting, or any other new symptoms, please return to the emergency department for reassessment.

## 2022-09-24 NOTE — ED Triage Notes (Signed)
Per patient  Decreased urinal output Sent by CA MD Dripping Procedure on May 17 Symptoms started then

## 2022-09-24 NOTE — Progress Notes (Signed)
  Radiation Oncology         [phone]) [phone] ________________________________  Name: ISIDOR CABAN MRN: [id number]  Date: 09/24/2022  DOB: Sep 24, 1950  COMPLEX SIMULATION NOTE  NARRATIVE:  The patient was brought to the CT Simulation planning suite today following prostate seed implantation approximately one month ago.  Identity was confirmed.  All relevant records and images related to the planned course of therapy were reviewed.  Then, the patient was set-up supine.  CT images were obtained.  The CT images were loaded into the planning software.  Then the prostate and rectum were contoured.  Treatment planning then occurred.  The implanted iodine 125 seeds were identified by the physics staff for projection of radiation distribution  I have requested : 3D Simulation  I have requested a DVH of the following structures: Prostate and rectum.    ________________________________  Artist Pais Kathrynn Running, M.D.

## 2022-09-24 NOTE — Progress Notes (Signed)
Radiation Oncology         [phone]) [phone] ________________________________  Name: Isaac Carpenter MRN: [id number]  Date: 09/24/2022  DOB: [birth year]  Post-Seed Follow-Up Visit Note  CC: [name], [name], MD  Marcine Matar, MD  Diagnosis:   72 y.o. gentleman with Stage T1c adenocarcinoma of the prostate with Gleason score of 4+3, and PSA of 13.2.     ICD-10-CM   1. Malignant neoplasm of prostate (HCC)  C61       Interval Since Last Radiation:  3 weeks 09/04/22:  Insertion of radioactive I-125 seeds into the prostate gland; 145 Gy, definitive therapy with placement of SpaceOAR gel.  Narrative:  The patient returns today for routine follow-up.  He is complaining of increased urinary frequency and urinary hesitation symptoms. He filled out a questionnaire regarding urinary function today providing and overall IPSS score of 35 characterizing his symptoms as severe with what sounds like overflow retention.  He reports that since the time of his procedure, he has been unable to pass more than a thimble full of urine despite frequent urges to void and that more often than not, his bladder empties with little or no warning, particularly when he stands up from a seated position for any amount of time. He has been wearing Depends undergarments since the time of the procedure and changes them, saturated, at least 3-4x/day. He also has had dysuria and rectal pressure. He has continued taking Flomax daily as prescribed and has not seen any gross hematuria and denies fever, chills or night sweats. He does have suprapubic discomfort and feels that his symptoms have been slowly worsening instead of improving.  His pre-implant score was 4. He has had some constipation despite frequent, strong urges to have a BM. He is having BMs, just small amounts of stool passing multiple times a day.  ALLERGIES:  is allergic to testosterone.  Meds: Current Outpatient Medications  Medication Sig Dispense Refill   aspirin  EC 81 MG tablet Take 81 mg by mouth daily. Swallow whole.     atorvastatin (LIPITOR) 20 MG tablet Take 1 tablet (20 mg total) by mouth daily. 90 tablet 1   levothyroxine (SYNTHROID) 88 MCG tablet Take 1 tablet (88 mcg total) by mouth daily. Please call [phone] to schedule a follow up appointment. 30 tablet 0   lisinopril-hydrochlorothiazide (ZESTORETIC) 20-25 MG tablet Take 1 tablet by mouth daily. Appointment needed for further refills. 30 tablet 0   tamsulosin (FLOMAX) 0.4 MG CAPS capsule Take 1 capsule by mouth daily.     No current facility-administered medications for this visit.    Physical Findings: In general this is a well appearing Caucasian male in no acute distress. He's alert and oriented x4 and appropriate throughout the examination. Cardiopulmonary assessment is negative for acute distress and he exhibits normal effort.   Lab Findings: Lab Results  Component Value Date   WBC 4.7 05/09/2021   HGB 14.3 09/04/2022   HCT 42.0 09/04/2022   MCV 91.4 05/09/2021   PLT 299.0 05/09/2021    Radiographic Findings:  Patient underwent CT imaging in our clinic for post implant dosimetry. The CT will be reviewed by Dr. Kathrynn Running to confirm there is an adequate distribution of radioactive seeds throughout the prostate gland and ensure that there are no seeds in or near the rectum.  We suspect the final radiation plan and dosimetry will show appropriate coverage of the prostate gland. He understands that we will call and inform him of any unexpected findings  on further review of his imaging and dosimetry.  Impression/Plan: 72 y.o. gentleman with Stage T1c adenocarcinoma of the prostate with Gleason score of 4+3, and PSA of 13.2.  The patient is recovering from the effects of radiation. His urinary symptoms should gradually improve over the next 4-6 months but I am concerned that he is in overflow urinary retention based on his symptoms. We talked about this today and made a call to the  urology office where they directed [place] to have him go straight to the [facility] ED for evaluation and he is in agreement. We talked about long-term follow-up for prostate cancer following seed implant. He understands that ongoing PSA determinations and digital rectal exams will help perform surveillance to rule out disease recurrence. He has a post-treatment follow up appointment scheduled with Anne Fu, NP on 10/01/22. He understands what to expect with his PSA measures. Patient was also educated today about some of the long-term effects from radiation including a small risk for rectal bleeding and possibly erectile dysfunction. We talked about some of the general management approaches to these potential complications. However, I did encourage the patient to contact our office or return at any point if he has questions or concerns related to his previous radiation and prostate cancer.    Marguarite Arbour, PA-C

## 2022-09-24 NOTE — Progress Notes (Signed)
Post-seed nursing interview for a diagnosis of Stage T1c adenocarcinoma of the prostate with Gleason score of 4+3, and PSA of 13.2.  Patient identity verified x2.  Patient reports severe urinary discomfort 8/10, w/ rectal pressure. No other issues conveyed at this time.  Meaningful use complete.  I-PSS score- 35 - Severe SHIM score- 1 (No sexual activity within last 6 months) Urinary Management medication(s) Tamsulosin Urology appointment- x1 week with Dr. Retta Diones at [facility] [place]  Vitals- BP (!) 111/54 (BP Location: Left Arm, Patient Position: Sitting, Cuff Size: Large)   Pulse (!) 57   Temp 97.6 F (36.4 C) (Temporal)   Resp 20   Ht 6\' 1"  (1.854 m)   Wt 293 lb (132.9 kg)   SpO2 95%   BMI 38.66 kg/m   This concludes the interview.   Ruel Favors, LPN

## 2022-09-24 NOTE — ED Provider Notes (Signed)
[place] EMERGENCY DEPARTMENT AT [facility] Provider Note  CSN: [id number] Arrival date & time: 09/24/22 1540  Chief Complaint(s) Urinary Retention  HPI Isaac Carpenter is a 72 y.o. male history of hyperlipidemia, hypertension, diabetes presenting to the emergency department with urinary symptoms.  Patient reports that he has had painful urination, dribbling, frequency for the past few days.  Recently had radioactive seed implantation in his prostate.  No fevers or chills.  Has some lower abdominal discomfort but no nausea or vomiting, diarrhea.  No fecal incontinence.  Reports having normal bowel movements.  No chest pain or shortness of breath.  He went to radiation oncology appointment today where he told them about his symptoms and they advised him to come to the ER for further evaluation.   Past Medical History Past Medical History:  Diagnosis Date   Arthritis    Cancer (HCC)    History of partial thyroidectomy STATES OVERACTIVE THYROID-- NO ISSUES SINCE AGE 72 AND NO MEDS   Hyperlipidemia    Hypertension    Hypothyroidism    Sleep apnea    Per patient he tried CPAP but could not tolerate.   Thyroid disease    Patient Active Problem List   Diagnosis Date Noted   Malignant neoplasm of prostate (HCC) 07/17/2022   Hyperlipidemia 05/11/2021   Morbid obesity (HCC)-BMI 37.8 with DM II,HLD,OSA,HTN 05/09/2021   Hypertension, essential, benign 07/23/2020   OSA (obstructive sleep apnea) 07/23/2020   Type 2 diabetes mellitus with other specified complication (HCC) 01/31/2020   BPH associated with nocturia 01/31/2020   Hypothyroidism, postsurgical 01/31/2020   Home Medication(s) Prior to Admission medications   Medication Sig Start Date End Date Taking? Authorizing Provider  cefdinir (OMNICEF) 300 MG capsule Take 1 capsule (300 mg total) by mouth 2 (two) times daily for 7 days. 09/24/22 10/01/22 Yes Lonell Grandchild, MD  aspirin EC 81 MG tablet Take 81 mg by mouth daily.  Swallow whole.    [provider]  atorvastatin (LIPITOR) 20 MG tablet Take 1 tablet (20 mg total) by mouth daily. 05/01/22   [name], [name], MD  levothyroxine (SYNTHROID) 88 MCG tablet Take 1 tablet (88 mcg total) by mouth daily. Please call [phone] to schedule a follow up appointment. 08/14/22   [name], [name], MD  lisinopril-hydrochlorothiazide (ZESTORETIC) 20-25 MG tablet Take 1 tablet by mouth daily. Appointment needed for further refills. 09/21/22   [name], [name], MD  tamsulosin (FLOMAX) 0.4 MG CAPS capsule Take 1 capsule by mouth daily. 11/27/19   [provider]                                                                                                                                    Past Surgical History Past Surgical History:  Procedure Laterality Date   CIRCUMCISION  01/04/2012   Procedure: CIRCUMCISION ADULT;  Surgeon: Kathi Ludwig, MD;  Location: [facility];  Service: Urology;  Laterality: N/A;   COLONOSCOPY  04/08/2020   Vito Cirigliano   CYSTOSCOPY  09/04/2022   Procedure: CYSTOSCOPY;  Surgeon: Marcine Matar, MD;  Location: [facility];  Service: Urology;;   RADIOACTIVE SEED IMPLANT N/A 09/04/2022   Procedure: RADIOACTIVE SEED IMPLANT/BRACHYTHERAPY IMPLANT;  Surgeon: Marcine Matar, MD;  Location: [facility];  Service: Urology;  Laterality: N/A;  90 MINS   SPACE OAR INSTILLATION N/A 09/04/2022   Procedure: SPACE OAR INSTILLATION;  Surgeon: Marcine Matar, MD;  Location: [facility];  Service: Urology;  Laterality: N/A;   THYROIDECTOMY, PARTIAL  AGE 89   OVERACTIVE THYROID   Family History Family History  Problem Relation Age of Onset   Cancer Mother    COPD Father    Heart attack Father    Cancer Sister    Cancer Brother    Cancer Sister    Cancer Brother    Colon cancer Neg Hx    Colon polyps Neg Hx    Esophageal cancer Neg Hx    Rectal cancer Neg Hx     Stomach cancer Neg Hx     Social History Social History   Tobacco Use   Smoking status: Never   Smokeless tobacco: Current    Types: Chew   Tobacco comments:    started chewing tobacco off and on since 1970  Vaping Use   Vaping Use: Never used  Substance Use Topics   Alcohol use: Never   Drug use: No   Allergies Testosterone  Review of Systems Review of Systems  All other systems reviewed and are negative.   Physical Exam Vital Signs  I have reviewed the triage vital signs BP (!) 107/49   Pulse (!) 44   Temp 98.1 F (36.7 C) (Oral)   Resp 18   Ht 6\' 1"  (1.854 m)   Wt 132.9 kg   SpO2 97%   BMI 38.66 kg/m  Physical Exam Vitals and nursing note reviewed.  Constitutional:      General: He is not in acute distress.    Appearance: Normal appearance.  HENT:     Mouth/Throat:     Mouth: Mucous membranes are moist.  Eyes:     Conjunctiva/sclera: Conjunctivae normal.  Cardiovascular:     Rate and Rhythm: Normal rate and regular rhythm.  Pulmonary:     Effort: Pulmonary effort is normal. No respiratory distress.     Breath sounds: Normal breath sounds.  Abdominal:     General: Abdomen is flat.     Palpations: Abdomen is soft.     Tenderness: There is no abdominal tenderness. There is no right CVA tenderness or left CVA tenderness.  Musculoskeletal:     Right lower leg: No edema.     Left lower leg: No edema.  Skin:    General: Skin is warm and dry.     Capillary Refill: Capillary refill takes less than 2 seconds.  Neurological:     Mental Status: He is alert and oriented to person, place, and time. Mental status is at baseline.  Psychiatric:        Mood and Affect: Mood normal.        Behavior: Behavior normal.     ED Results and Treatments Labs (all labs ordered are listed, but only abnormal results are displayed) Labs Reviewed  COMPREHENSIVE METABOLIC PANEL - Abnormal; Notable for the following components:      Result Value   Potassium 3.2 (*)  Glucose, Bld 106 (*)    BUN 24 (*)    Calcium 8.6 (*)    All other components within normal limits  CBC WITH DIFFERENTIAL/PLATELET - Abnormal; Notable for the following components:   RBC 4.09 (*)    Hemoglobin 12.6 (*)    HCT 38.4 (*)    All other components within normal limits  URINALYSIS, W/ REFLEX TO CULTURE (INFECTION SUSPECTED) - Abnormal; Notable for the following components:   Hgb urine dipstick MODERATE (*)    Leukocytes,Ua TRACE (*)    Bacteria, UA RARE (*)    All other components within normal limits  URINE CULTURE                                                                                                                          Radiology CT Renal Stone Study  Result Date: 09/24/2022 CLINICAL DATA:  Decreased urine output. EXAM: CT ABDOMEN AND PELVIS WITHOUT CONTRAST TECHNIQUE: Multidetector CT imaging of the abdomen and pelvis was performed following the standard protocol without IV contrast. RADIATION DOSE REDUCTION: This exam was performed according to the departmental dose-optimization program which includes automated exposure control, adjustment of the mA and/or kV according to patient size and/or use of iterative reconstruction technique. COMPARISON:  June 04, 2022 FINDINGS: Lower chest: Mild atelectasis is seen within the bilateral lung bases. Hepatobiliary: No focal liver abnormality is seen. No gallstones, gallbladder wall thickening, or biliary dilatation. Pancreas: Unremarkable. No pancreatic ductal dilatation or surrounding inflammatory changes. Spleen: Normal in size without focal abnormality. Adrenals/Urinary Tract: Adrenal glands are unremarkable. Kidneys are normal in size, without obstructing renal calculi, focal lesion, or hydronephrosis. 2 mm and 4 mm nonobstructing renal calculi are seen within the right kidney. Bladder is unremarkable. Stomach/Bowel: Stomach is within normal limits. Appendix appears normal. No evidence of bowel wall thickening, distention,  or inflammatory changes. Stable, mild, diffuse mesenteric inflammatory fat stranding is seen along the midline of the mid to lower abdomen. Stable, likely chronic subcentimeter mesenteric lymph nodes are seen within this region. Vascular/Lymphatic: Aortic atherosclerosis. No enlarged abdominal or pelvic lymph nodes. Reproductive: Multiple prostate radiation implantation seeds are seen within a mild to moderately enlarged prostate gland. This represents a new finding when compared to the prior study A 2.4 cm x 1.2 cm x 2.4 cm well-defined area of contrast attenuation (approximately 186.86 Hounsfield units) is seen adjacent to the posterior aspect of the prostate gland on the left (axial CT images 83 through 88, CT series 2). This represents a new finding when compared to the prior study Other: No abdominal wall hernia or abnormality. No abdominopelvic ascites. Musculoskeletal: Multilevel degenerative changes seen throughout the lumbar spine. IMPRESSION: 1. Multiple prostate radiation implantation seeds within a mild to moderately enlarged prostate gland. 2. Area of contrast attenuation adjacent to the posterior aspect of the prostate gland, which represents a new finding and is likely secondary to the patient's recent procedure. This may represent retained and/or extravasated contrast originating from a  dilated or ruptured seminal vesicle. Urology consult is recommended. 3. 2 mm and 4 mm nonobstructing right renal calculi. 4. Aortic atherosclerosis. Aortic Atherosclerosis (ICD10-I70.0). Electronically Signed   By: Aram Candela M.D.   On: 09/24/2022 18:35    Pertinent labs & imaging results that were available during my care of the patient were reviewed by me and considered in my medical decision making (see MDM for details).  Medications Ordered in ED Medications  cefdinir (OMNICEF) capsule 300 mg (300 mg Oral Given 09/24/22 2003)                                                                                                                                      Procedures Procedures  (including critical care time)  Medical Decision Making / ED Course   MDM:  72 year old male presenting to the emergency department urinary symptoms.  Patient well-appearing, physical exam not any focal abnormality.  No abdominal tenderness.  Patient sent for concern for bladder obstruction given recent prostate procedure.  Bedside bladder scan performed by nursing, no clear retention seen but patient obese so could be limited due to this.  Will check urinalysis to evaluate for infection.  Given symptoms we will also obtain CT renal stone protocol to evaluate for intra-abdominal process as patient was having some abdominal pain.  Patient denies any numbness or tingling, weakness, fecal incontinence to suggest alternative cause of urinary symptoms and incontinence such as spinal cord cause.  Will reassess.  Patient overall very well-appearing so suspect he likely will be safe for discharge if workup is reassuring.  Clinical Course as of 09/24/22 2059  Thu Sep 24, 2022  2056 Discussed with on call urology resident [place] who reviewed imaging and reports CT finding of seminal vesicle abnormality likely due to recent procedure, recommends outpatient followup. CT abdomen without signs of urinary obstruction. UA indeterminate for infection but given symptoms will send culture and start antibiotics. Will discharge patient to home. All questions answered. Patient comfortable with plan of discharge. Return precautions discussed with patient and specified on the after visit summary.  [WS]    Clinical Course User Index [WS] Suezanne Jacquet, Jerilee Field, MD     Additional history obtained: -Additional history obtained from spouse -External records from outside source obtained and reviewed including: Chart review including previous notes, labs, imaging, consultation notes including note from rad onc today   Lab Tests: -I  ordered, reviewed, and interpreted labs.   The pertinent results include:   Labs Reviewed  COMPREHENSIVE METABOLIC PANEL - Abnormal; Notable for the following components:      Result Value   Potassium 3.2 (*)    Glucose, Bld 106 (*)    BUN 24 (*)    Calcium 8.6 (*)    All other components within normal limits  CBC WITH DIFFERENTIAL/PLATELET - Abnormal; Notable for the following components:   RBC 4.09 (*)    Hemoglobin 12.6 (*)  HCT 38.4 (*)    All other components within normal limits  URINALYSIS, W/ REFLEX TO CULTURE (INFECTION SUSPECTED) - Abnormal; Notable for the following components:   Hgb urine dipstick MODERATE (*)    Leukocytes,Ua TRACE (*)    Bacteria, UA RARE (*)    All other components within normal limits  URINE CULTURE    Notable for possible UTI. Mild hypokalemia   Imaging Studies ordered: I ordered imaging studies including CT abd On my interpretation imaging demonstrates no obstruction, ?seminal vesicle problem I independently visualized and interpreted imaging. I agree with the radiologist interpretation   Medicines ordered and prescription drug management: Meds ordered this encounter  Medications   cefdinir (OMNICEF) capsule 300 mg   cefdinir (OMNICEF) 300 MG capsule    Sig: Take 1 capsule (300 mg total) by mouth 2 (two) times daily for 7 days.    Dispense:  14 capsule    Refill:  0    -I have reviewed the patients home medicines and have made adjustments as needed   Consultations Obtained: I requested consultation with the urologist,  and discussed lab and imaging findings as well as pertinent plan - they recommend: admission   Cardiac Monitoring: The patient was maintained on a cardiac monitor.  I personally viewed and interpreted the cardiac monitored which showed an underlying rhythm of: sinus bradycardia  Social Determinants of Health:  Diagnosis or treatment significantly limited by social determinants of health:  obesity   Reevaluation: After the interventions noted above, I reevaluated the patient and found that their symptoms have improved  Co morbidities that complicate the patient evaluation  Past Medical History:  Diagnosis Date   Arthritis    Cancer (HCC)    History of partial thyroidectomy STATES OVERACTIVE THYROID-- NO ISSUES SINCE AGE 89 AND NO MEDS   Hyperlipidemia    Hypertension    Hypothyroidism    Sleep apnea    Per patient he tried CPAP but could not tolerate.   Thyroid disease       Dispostion: Disposition decision including need for hospitalization was considered, and patient discharged from emergency department.    Final Clinical Impression(s) / ED Diagnoses Final diagnoses:  Lower urinary tract infectious disease  Disorder of seminal vesicle     This chart was dictated using voice recognition software.  Despite best efforts to proofread,  errors can occur which can change the documentation meaning.    Lonell Grandchild, MD 09/24/22 2059

## 2022-09-25 ENCOUNTER — Other Ambulatory Visit: Payer: Self-pay | Admitting: Urology

## 2022-09-25 ENCOUNTER — Other Ambulatory Visit: Payer: Self-pay | Admitting: Family Medicine

## 2022-09-25 DIAGNOSIS — E890 Postprocedural hypothyroidism: Secondary | ICD-10-CM

## 2022-09-25 MED ORDER — TAMSULOSIN HCL 0.4 MG PO CAPS: 0.4000 mg | ORAL_CAPSULE | Freq: Two times a day (BID) | ORAL | 2 refills | Status: DC

## 2022-09-26 LAB — URINE CULTURE

## 2022-09-27 ENCOUNTER — Encounter: Payer: Self-pay | Admitting: Family Medicine

## 2022-09-27 LAB — URINE CULTURE: Culture: 80000 — AB

## 2022-09-28 ENCOUNTER — Telehealth (HOSPITAL_BASED_OUTPATIENT_CLINIC_OR_DEPARTMENT_OTHER): Payer: Self-pay | Admitting: *Deleted

## 2022-09-28 ENCOUNTER — Encounter: Payer: Self-pay | Admitting: Radiation Oncology

## 2022-09-28 DIAGNOSIS — C61 Malignant neoplasm of prostate: Secondary | ICD-10-CM | POA: Diagnosis not present

## 2022-09-28 DIAGNOSIS — Z191 Hormone sensitive malignancy status: Secondary | ICD-10-CM | POA: Diagnosis not present

## 2022-09-28 NOTE — Progress Notes (Signed)
ED Antimicrobial Stewardship Positive Culture Follow Up   Isaac Carpenter is an 72 y.o. male who presented to [facility] on 09/24/2022 with a chief complaint of  Chief Complaint  Patient presents with   Urinary Retention    Recent Results (from the past 720 hour(s))  Urine Culture     Status: Abnormal   Collection Time: 09/24/22  7:24 PM   Specimen: Urine, Clean Catch  Result Value Ref Range Status   Specimen Description   Final    URINE, CLEAN CATCH Performed at [facility], [address], [place], [place] [address]    Special Requests   Final    NONE Performed at [facility], [address] [place], [place] [address]    Culture (A)  Final    80,000 COLONIES/mL ESCHERICHIA COLI 20,000 COLONIES/mL STAPHYLOCOCCUS HAEMOLYTICUS    Report Status 09/27/2022 FINAL  Final   Organism ID, Bacteria ESCHERICHIA COLI (A)  Final   Organism ID, Bacteria STAPHYLOCOCCUS HAEMOLYTICUS (A)  Final      Susceptibility   Escherichia coli - MIC*    AMPICILLIN <=2 SENSITIVE Sensitive     CEFAZOLIN <=4 SENSITIVE Sensitive     CEFEPIME <=0.12 SENSITIVE Sensitive     CEFTRIAXONE <=0.25 SENSITIVE Sensitive     CIPROFLOXACIN <=0.25 SENSITIVE Sensitive     GENTAMICIN <=1 SENSITIVE Sensitive     IMIPENEM <=0.25 SENSITIVE Sensitive     NITROFURANTOIN <=16 SENSITIVE Sensitive     TRIMETH/SULFA <=20 SENSITIVE Sensitive     AMPICILLIN/SULBACTAM <=2 SENSITIVE Sensitive     PIP/TAZO <=4 SENSITIVE Sensitive     * 80,000 COLONIES/mL ESCHERICHIA COLI   Staphylococcus haemolyticus - MIC*    CIPROFLOXACIN >=8 RESISTANT Resistant     GENTAMICIN <=0.5 SENSITIVE Sensitive     NITROFURANTOIN <=16 SENSITIVE Sensitive     OXACILLIN >=4 RESISTANT Resistant     TETRACYCLINE >=16 RESISTANT Resistant     VANCOMYCIN 1 SENSITIVE Sensitive     TRIMETH/SULFA <=10 SENSITIVE Sensitive     CLINDAMYCIN >=8 RESISTANT Resistant     RIFAMPIN <=0.5 SENSITIVE Sensitive     Inducible  Clindamycin NEGATIVE Sensitive     * 20,000 COLONIES/mL STAPHYLOCOCCUS HAEMOLYTICUS   72 year old male came for follow up after prostate seed implantation. Reported symptoms of severe urinary discomfort w/ rectal pressure. Urine culture grew 80,000 colonies/mL of Ecoli and 20,000 colonies/mL of Staph. Haemolyticus. UA had some WBC.   Patient discharged on cefdinir 300 mg PO caps x7 days. No change needed symptoms most likely due to procedure.      ED Provider: Lynden Oxford, MD   Erasmo Leventhal 09/28/2022, 8:32 AM Clinical Pharmacist Monday - Friday phone -  [phone] Saturday - Sunday phone - [phone]

## 2022-09-28 NOTE — Radiation Completion Notes (Signed)
Patient Name: Isaac Carpenter, Isaac Carpenter MRN: [id number] Date of Birth: [birth year] Referring Physician: Marcine Matar, M.D. Date of Service: 2022-09-28 Radiation Oncologist: Margaretmary Bayley, M.D. [facility]                             RADIATION ONCOLOGY END OF TREATMENT NOTE     Diagnosis: C61 Malignant neoplasm of prostate Staging on 2022-05-13: Malignant neoplasm of prostate (HCC) T=cT1c, N=cN0, M=cM0 Intent: Curative     ==========DELIVERED PLANS==========  Prostate Seed Implant Date: 2022-09-04   Plan Name: Prostate Seed Implant Site: Prostate Technique: Radioactive Seed Implant I-125 Mode: Brachytherapy Dose Per Fraction: 145 Gy Prescribed Dose (Delivered / Prescribed): 145 Gy / 145 Gy Prescribed Fxs (Delivered / Prescribed): 1 / 1     ==========ON TREATMENT VISIT DATES========== 2022-09-04     ==========UPCOMING VISITS==========

## 2022-09-28 NOTE — Telephone Encounter (Signed)
Post ED Visit - Positive Culture Follow-up  Culture report reviewed by antimicrobial stewardship pharmacist: [facility] []  Enzo Bi, Pharm.D. []  Celedonio Miyamoto, Pharm.D., BCPS AQ-ID []  Garvin Fila, Pharm.D., BCPS []  Georgina Pillion, [address].D., BCPS []  [place], [address].D., BCPS, AAHIVP []  Estella Husk, Pharm.D., BCPS, AAHIVP []  Lysle Pearl, PharmD, BCPS []  Phillips Climes, PharmD, BCPS []  Agapito Games, PharmD, BCPS []  Verlan Friends, PharmD []  Mervyn Gay, PharmD, BCPS []  Vinnie Level, PharmD  [facility] []  Len Childs, PharmD []  Greer Pickerel, PharmD []  Adalberto Cole, PharmD []  Perlie Gold, Rph []  Lonell Face) Jean Rosenthal, PharmD []  Earl Many, PharmD []  Junita Push, PharmD []  Dorna Leitz, PharmD []  Terrilee Files, PharmD []  Lynann Beaver, PharmD []  Keturah Barre, PharmD []  Loralee Pacas, PharmD []  Bernadene Person, PharmD   Positive urine culture Treated with cefdinir, organism sensitive to the same and no further patient follow-up is required at this time per Dr. Eula Fried, Dagmar Hait 09/28/2022, 8:44 AM

## 2022-09-28 NOTE — Progress Notes (Signed)
RN spoke with patient to follow up from recent ED visit.  Patient reports that he is able to void, no dysuria, and slept through the night.   Pt confirms that he is taking Flomax BID as directed and will continue this.  RN encouraged patient if symptoms change to call urology office.  Pt verbalized understanding and thankfulness.   No additional needs at this time.

## 2022-09-29 ENCOUNTER — Other Ambulatory Visit: Payer: Self-pay | Admitting: Family Medicine

## 2022-09-29 ENCOUNTER — Telehealth: Payer: Self-pay

## 2022-09-29 DIAGNOSIS — E890 Postprocedural hypothyroidism: Secondary | ICD-10-CM

## 2022-09-29 NOTE — Telephone Encounter (Signed)
Transition Care Management Follow-up Telephone Call Date of discharge and from where: 09/24/2022 [facility] How have you been since you were released from the hospital? Patient is feeling a little better. Any questions or concerns? No  Items Reviewed: Did the pt receive and understand the discharge instructions provided? Yes  Medications obtained and verified? Yes  Other? No  Any new allergies since your discharge? No  Dietary orders reviewed? Yes Do you have support at home? Yes   Follow up appointments reviewed:  PCP Hospital f/u appt confirmed? Yes  Scheduled to see Betty [name], MD on 09/27/2022 @ [facility] [place]. Specialist Hospital f/u appt confirmed? Yes  Scheduled to see Deberah Castle, RN on 09/25/2022 @ [facility]. Are transportation arrangements needed? No  If their condition worsens, is the pt aware to call PCP or go to the Emergency Dept.? Yes Was the patient provided with contact information for the PCP's office or ED? Yes Was to pt encouraged to call back with questions or concerns? Yes  Gusta Marksberry [facility]  [facility] Population Health Community Resource Care Guide   [email or web address]  ?? [phone]   Website: [email or web address]  [email or web address]

## 2022-09-30 ENCOUNTER — Other Ambulatory Visit: Payer: Self-pay | Admitting: Family Medicine

## 2022-09-30 DIAGNOSIS — E890 Postprocedural hypothyroidism: Secondary | ICD-10-CM

## 2022-10-01 DIAGNOSIS — N3000 Acute cystitis without hematuria: Secondary | ICD-10-CM | POA: Diagnosis not present

## 2022-10-01 DIAGNOSIS — C61 Malignant neoplasm of prostate: Secondary | ICD-10-CM | POA: Diagnosis not present

## 2022-10-02 ENCOUNTER — Telehealth (INDEPENDENT_AMBULATORY_CARE_PROVIDER_SITE_OTHER): Payer: Medicare HMO | Admitting: Family Medicine

## 2022-10-02 ENCOUNTER — Encounter: Payer: Self-pay | Admitting: Family Medicine

## 2022-10-02 VITALS — Ht 73.0 in | Wt 293.0 lb

## 2022-10-02 DIAGNOSIS — E1169 Type 2 diabetes mellitus with other specified complication: Secondary | ICD-10-CM | POA: Diagnosis not present

## 2022-10-02 DIAGNOSIS — I10 Essential (primary) hypertension: Secondary | ICD-10-CM | POA: Diagnosis not present

## 2022-10-02 DIAGNOSIS — E890 Postprocedural hypothyroidism: Secondary | ICD-10-CM

## 2022-10-02 DIAGNOSIS — E785 Hyperlipidemia, unspecified: Secondary | ICD-10-CM

## 2022-10-02 MED ORDER — LEVOTHYROXINE SODIUM 88 MCG PO TABS
ORAL_TABLET | ORAL | 2 refills | Status: DC
Start: 2022-10-02 — End: 2023-07-12

## 2022-10-02 MED ORDER — LISINOPRIL-HYDROCHLOROTHIAZIDE 20-25 MG PO TABS: 1.0000 | ORAL_TABLET | Freq: Every day | ORAL | 2 refills | Status: DC

## 2022-10-02 MED ORDER — ATORVASTATIN CALCIUM 20 MG PO TABS
20.0000 mg | ORAL_TABLET | Freq: Every day | ORAL | 2 refills | Status: DC
Start: 2022-10-02 — End: 2023-05-17

## 2022-10-02 NOTE — Assessment & Plan Note (Signed)
Problem has been adequately controlled. Last hemoglobin A1c 6.7 in 04/2021. Continue nonpharmacologic treatment. Annual eye exam, periodic dental and foot care to continue.  F/U in 5-6 months.

## 2022-10-02 NOTE — Progress Notes (Unsigned)
Virtual Visit via Video Note I connected with Isaac Carpenter on 10/04/22 by a video enabled telemedicine application and verified that I am speaking with the correct person using two identifiers. Location patient: home Location provider:work office Persons participating in the virtual visit: patient, wife,and provider  I discussed the limitations of evaluation and management by telemedicine and the availability of in person appointments. The patient expressed understanding and agreed to proceed.  Chief Complaint  Patient presents with   Medical Management of Chronic Issues   HPI: Isaac Carpenter is a 72 yo male seen today for follow up. Since his last visit , 05/09/21, he has  followed with urologist, Dx'ed with prostate cancer and undergone radioactive seed implant/brachytherapy on 09/04/22. Evaluated in the ED on 09/24/22 for UTI, started on Cefdinir and yesterday he was changed to Bactrim by his urologist. Urinary symptoms has improved and he has not had any fever.  DM 2: Diagnosed in 01/2020 with a hemoglobin A1c of 6.5. He is on non pharmacologic treatment. Eye exam > a year ago, had to cancel appt due to recent health issues. Negative for polyuria, polydipsia, numbness extremities, foot ulcers/trauma.   Lab Results  Component Value Date   HGBA1C 6.7 (H) 05/09/2021   Lab Results  Component Value Date   MICROALBUR 0.9 05/09/2021   MICROALBUR 1.0 07/23/2020   Hypothyroidism: S/P partial thyroidectomy. He is on Levothyroxine 88 mcg daily.  Lab Results  Component Value Date   TSH 3.19 05/09/2021   Hypertension: He is on Lisinopril-hydrochlorothiazide 20-25 mg daily. He is not checking BP at home but during OV's it has been "good." Negative for unusual or severe headache, visual changes, exertional chest pain, dyspnea,  focal weakness, or worsening edema.  Lab Results  Component Value Date   CREATININE 0.93 09/24/2022   BUN 24 (H) 09/24/2022   NA 138 09/24/2022   K 3.2 (L)  09/24/2022   CL 102 09/24/2022   CO2 28 09/24/2022   HLD on Atorvastatin 20 mg daily. Lab Results  Component Value Date   CHOL 128 05/09/2021   HDL 35.10 (L) 05/09/2021   LDLCALC 75 05/09/2021   TRIG 87.0 05/09/2021   CHOLHDL 4 05/09/2021   ROS: See pertinent positives and negatives per HPI.  Past Medical History:  Diagnosis Date   Arthritis    Cancer (HCC)    History of partial thyroidectomy STATES OVERACTIVE THYROID-- NO ISSUES SINCE AGE 44 AND NO MEDS   Hyperlipidemia    Hypertension    Hypothyroidism    Sleep apnea    Per patient he tried CPAP but could not tolerate.   Thyroid disease     Past Surgical History:  Procedure Laterality Date   CIRCUMCISION  01/04/2012   Procedure: CIRCUMCISION ADULT;  Surgeon: Kathi Ludwig, MD;  Location: [facility];  Service: Urology;  Laterality: N/A;   COLONOSCOPY  04/08/2020   Vito Cirigliano   CYSTOSCOPY  09/04/2022   Procedure: CYSTOSCOPY;  Surgeon: Marcine Matar, MD;  Location: [facility];  Service: Urology;;   RADIOACTIVE SEED IMPLANT N/A 09/04/2022   Procedure: RADIOACTIVE SEED IMPLANT/BRACHYTHERAPY IMPLANT;  Surgeon: Marcine Matar, MD;  Location: [facility];  Service: Urology;  Laterality: N/A;  90 MINS   SPACE OAR INSTILLATION N/A 09/04/2022   Procedure: SPACE OAR INSTILLATION;  Surgeon: Marcine Matar, MD;  Location: [facility];  Service: Urology;  Laterality: N/A;   THYROIDECTOMY, PARTIAL  AGE 44   OVERACTIVE THYROID  Family History  Problem Relation Age of Onset   Cancer Mother    COPD Father    Heart attack Father    Cancer Sister    Cancer Brother    Cancer Sister    Cancer Brother    Colon cancer Neg Hx    Colon polyps Neg Hx    Esophageal cancer Neg Hx    Rectal cancer Neg Hx    Stomach cancer Neg Hx     Social History   Socioeconomic History   Marital status: Married    Spouse name: Not on file   Number of children:  Not on file   Years of education: Not on file   Highest education level: Not on file  Occupational History   Not on file  Tobacco Use   Smoking status: Never   Smokeless tobacco: Current    Types: Chew   Tobacco comments:    started chewing tobacco off and on since 1970  Vaping Use   Vaping Use: Never used  Substance and Sexual Activity   Alcohol use: Never   Drug use: No   Sexual activity: Not Currently  Other Topics Concern   Not on file  Social History Narrative   Not on file   Social Determinants of Health   Financial Resource Strain: Low Risk  (10/08/2021)   Overall Financial Resource Strain (CARDIA)    Difficulty of Paying Living Expenses: Not hard at all  Food Insecurity: No Food Insecurity (07/17/2022)   Hunger Vital Sign    Worried About Running Out of Food in the Last Year: Never true    Ran Out of Food in the Last Year: Never true  Transportation Needs: No Transportation Needs (07/17/2022)   PRAPARE - Administrator, Civil Service (Medical): No    Lack of Transportation (Non-Medical): No  Physical Activity: Inactive (10/08/2021)   Exercise Vital Sign    Days of Exercise per Week: 0 days    Minutes of Exercise per Session: 0 min  Stress: No Stress Concern Present (10/08/2021)   [employer] of Occupational Health - Occupational Stress Questionnaire    Feeling of Stress : Not at all  Social Connections: Socially Isolated (10/08/2021)   Social Connection and Isolation Panel [NHANES]    Frequency of Communication with Friends and Family: More than three times a week    Frequency of Social Gatherings with Friends and Family: More than three times a week    Attends Religious Services: Never    Database administrator or Organizations: No    Attends Banker Meetings: Never    Marital Status: Divorced  Catering manager Violence: Not At Risk (07/17/2022)   Humiliation, Afraid, Rape, and Kick questionnaire    Fear of Current or Ex-Partner: No     Emotionally Abused: No    Physically Abused: No    Sexually Abused: No    Current Outpatient Medications:    aspirin EC 81 MG tablet, Take 81 mg by mouth daily. Swallow whole., Disp: , Rfl:    tamsulosin (FLOMAX) 0.4 MG CAPS capsule, Take 1 capsule (0.4 mg total) by mouth 2 (two) times daily after a meal., Disp: 60 capsule, Rfl: 2   atorvastatin (LIPITOR) 20 MG tablet, Take 1 tablet (20 mg total) by mouth daily., Disp: 90 tablet, Rfl: 2   levothyroxine (SYNTHROID) 88 MCG tablet, TAKE ONE TABLET BY MOUTH DAILY., Disp: 90 tablet, Rfl: 2   lisinopril-hydrochlorothiazide (ZESTORETIC) 20-25 MG tablet, Take 1  tablet by mouth daily., Disp: 90 tablet, Rfl: 2  EXAM:  VITALS per patient if applicable: Ht 6\' 1"  (1.854 m)   Wt 293 lb (132.9 kg)   BMI 38.66 kg/m   Wt Readings from Last 3 Encounters:  10/02/22 293 lb (132.9 kg)  09/24/22 293 lb (132.9 kg)  09/24/22 293 lb (132.9 kg)   GENERAL: alert, oriented, appears well and in no acute distress  HEENT: atraumatic, conjunctiva clear, no obvious abnormalities on inspection.  NECK: normal movements of the head and neck  LUNGS: on inspection no signs of respiratory distress, breathing rate appears normal, no obvious gross SOB, gasping or wheezing  CV: no obvious cyanosis  MS: moves all visible extremities without noticeable abnormality  PSYCH/NEURO: pleasant and cooperative, no obvious depression or anxiety, speech and thought processing grossly intact  ASSESSMENT AND PLAN:  Discussed the following assessment and plan:  Type 2 diabetes mellitus with other specified complication, without long-term current use of insulin (HCC)  Hypertension, essential, benign - Plan: lisinopril-hydrochlorothiazide (ZESTORETIC) 20-25 MG tablet  Hypothyroidism, postsurgical - Plan: levothyroxine (SYNTHROID) 88 MCG tablet  Hyperlipidemia, unspecified hyperlipidemia type - Plan: atorvastatin (LIPITOR) 20 MG tablet  Type 2 diabetes mellitus with other  specified complication, without long-term current use of insulin (HCC) Assessment & Plan: Problem has been adequately controlled. Last hemoglobin A1c 6.7 in 04/2021. Continue nonpharmacologic treatment. Annual eye exam, periodic dental and foot care to continue.  F/U in 5-6 months.   Hypertension, essential, benign Assessment & Plan: He is not monitoring BP at home but he is reporting adequate BP readings during office visits. Continue lisinopril-HCTZ 20-25 mg daily and low-salt diet. He is overdue for eye exam, will schedule appointment once he recovered from recent procedure and acute illness (UTI). Follow-up in 6 months.   Orders: -     Lisinopril-hydroCHLOROthiazide; Take 1 tablet by mouth daily.  Dispense: 90 tablet; Refill: 2  Hypothyroidism, postsurgical Assessment & Plan: Problem has been adequately controlled. Last TSH 3.1 in 04/2021. Continue levothyroxine 88 mcg daily. We will plan on checking TSH next visit.  Orders: -     Levothyroxine Sodium; TAKE ONE TABLET BY MOUTH DAILY.  Dispense: 90 tablet; Refill: 2  Hyperlipidemia, unspecified hyperlipidemia type Assessment & Plan: LDL 75 in 04/2021. Continue Atorvastatin 20 mg daily and low fat diet. Will plan on fasting labs next OV.  Orders: -     Atorvastatin Calcium; Take 1 tablet (20 mg total) by mouth daily.  Dispense: 90 tablet; Refill: 2   We discussed possible serious and likely etiologies, options for evaluation and workup, limitations of telemedicine visit vs in person visit, treatment, treatment risks and precautions. The patient was advised to call back or seek an in-person evaluation if the symptoms worsen or if the condition fails to improve as anticipated. I discussed the assessment and treatment plan with the patient. The patient was provided an opportunity to ask questions and all were answered. The patient agreed with the plan and demonstrated an understanding of the instructions.  Return in about 6  months (around 04/03/2023) for chronic problems, Labs.  Berman Grainger G. [name], MD  [facility]. [place] office.

## 2022-10-02 NOTE — Assessment & Plan Note (Signed)
He is not monitoring BP at home but he is reporting adequate BP readings during office visits. Continue lisinopril-HCTZ 20-25 mg daily and low-salt diet. He is overdue for eye exam, will schedule appointment once he recovered from recent procedure and acute illness (UTI). Follow-up in 6 months.

## 2022-10-02 NOTE — Progress Notes (Signed)
  Radiation Oncology         [phone]) [phone] ________________________________  Name: KRISTER SHIRE MRN: [id number]  Date: 09/28/2022  DOB: 1950/05/20  3D Planning Note   Prostate Brachytherapy Post-Implant Dosimetry  Diagnosis: 72 y.o. gentleman with Stage T1c adenocarcinoma of the prostate with Gleason score of 4+3, and PSA of 13.2.   Narrative: On a previous date, ADONIS CONTO returned following prostate seed implantation for post implant planning. He underwent CT scan complex simulation to delineate the three-dimensional structures of the pelvis and demonstrate the radiation distribution.  Since that time, the seed localization, and complex isodose planning with dose volume histograms have now been completed.  Results:   Prostate Coverage - The dose of radiation delivered to the 90% or more of the prostate gland (D90) was 126.05% of the prescription dose. This exceeds our goal of greater than 90%. Rectal Sparing - The volume of rectal tissue receiving the prescription dose or higher was 0.0 cc. This falls under our thresholds tolerance of 1.0 cc.  Impression: The prostate seed implant appears to show adequate target coverage and appropriate rectal sparing.  Plan:  The patient will continue to follow with urology for ongoing PSA determinations. I would anticipate a high likelihood for local tumor control with minimal risk for rectal morbidity.  ________________________________  Artist Pais Kathrynn Running, M.D.

## 2022-10-02 NOTE — Assessment & Plan Note (Signed)
Problem has been adequately controlled. Last TSH 3.1 in 04/2021. Continue levothyroxine 88 mcg daily. We will plan on checking TSH next visit.

## 2022-10-04 NOTE — Assessment & Plan Note (Signed)
LDL 75 in 04/2021. Continue Atorvastatin 20 mg daily and low fat diet. Will plan on fasting labs next OV.

## 2022-10-12 DIAGNOSIS — R3915 Urgency of urination: Secondary | ICD-10-CM | POA: Diagnosis not present

## 2022-10-30 DIAGNOSIS — R3915 Urgency of urination: Secondary | ICD-10-CM | POA: Diagnosis not present

## 2022-11-10 ENCOUNTER — Inpatient Hospital Stay: Payer: Medicare HMO | Attending: Adult Health | Admitting: *Deleted

## 2022-11-10 ENCOUNTER — Encounter: Payer: Self-pay | Admitting: *Deleted

## 2022-11-10 DIAGNOSIS — C61 Malignant neoplasm of prostate: Secondary | ICD-10-CM

## 2022-11-10 NOTE — Progress Notes (Signed)
Pt will have post treatment PSA labs in September at [facility]. SCP reviewed and completed.

## 2022-12-28 DIAGNOSIS — C61 Malignant neoplasm of prostate: Secondary | ICD-10-CM | POA: Diagnosis not present

## 2023-01-04 DIAGNOSIS — R3915 Urgency of urination: Secondary | ICD-10-CM | POA: Diagnosis not present

## 2023-01-04 DIAGNOSIS — R972 Elevated prostate specific antigen [PSA]: Secondary | ICD-10-CM | POA: Diagnosis not present

## 2023-01-04 DIAGNOSIS — C61 Malignant neoplasm of prostate: Secondary | ICD-10-CM | POA: Diagnosis not present

## 2023-01-21 DIAGNOSIS — E890 Postprocedural hypothyroidism: Secondary | ICD-10-CM | POA: Diagnosis present

## 2023-01-21 DIAGNOSIS — F17220 Nicotine dependence, chewing tobacco, uncomplicated: Secondary | ICD-10-CM | POA: Diagnosis present

## 2023-01-21 DIAGNOSIS — C329 Malignant neoplasm of larynx, unspecified: Secondary | ICD-10-CM | POA: Diagnosis present

## 2023-01-21 DIAGNOSIS — K828 Other specified diseases of gallbladder: Secondary | ICD-10-CM | POA: Diagnosis present

## 2023-01-21 DIAGNOSIS — Z79890 Hormone replacement therapy (postmenopausal): Secondary | ICD-10-CM

## 2023-01-21 DIAGNOSIS — K219 Gastro-esophageal reflux disease without esophagitis: Secondary | ICD-10-CM | POA: Diagnosis present

## 2023-01-21 DIAGNOSIS — C090 Malignant neoplasm of tonsillar fossa: Secondary | ICD-10-CM | POA: Diagnosis present

## 2023-01-21 DIAGNOSIS — N401 Enlarged prostate with lower urinary tract symptoms: Secondary | ICD-10-CM | POA: Diagnosis present

## 2023-01-21 DIAGNOSIS — F419 Anxiety disorder, unspecified: Secondary | ICD-10-CM | POA: Diagnosis present

## 2023-01-21 DIAGNOSIS — D638 Anemia in other chronic diseases classified elsewhere: Secondary | ICD-10-CM | POA: Diagnosis present

## 2023-01-21 DIAGNOSIS — T451X5A Adverse effect of antineoplastic and immunosuppressive drugs, initial encounter: Secondary | ICD-10-CM | POA: Diagnosis present

## 2023-01-21 DIAGNOSIS — N200 Calculus of kidney: Secondary | ICD-10-CM | POA: Diagnosis present

## 2023-01-21 DIAGNOSIS — E66811 Obesity, class 1: Secondary | ICD-10-CM | POA: Diagnosis present

## 2023-01-21 DIAGNOSIS — E785 Hyperlipidemia, unspecified: Secondary | ICD-10-CM | POA: Diagnosis present

## 2023-01-21 DIAGNOSIS — G4733 Obstructive sleep apnea (adult) (pediatric): Secondary | ICD-10-CM | POA: Diagnosis present

## 2023-01-21 DIAGNOSIS — E1169 Type 2 diabetes mellitus with other specified complication: Secondary | ICD-10-CM | POA: Diagnosis present

## 2023-01-21 DIAGNOSIS — Z6832 Body mass index (BMI) 32.0-32.9, adult: Secondary | ICD-10-CM

## 2023-01-21 DIAGNOSIS — Z825 Family history of asthma and other chronic lower respiratory diseases: Secondary | ICD-10-CM

## 2023-01-21 DIAGNOSIS — D701 Agranulocytosis secondary to cancer chemotherapy: Secondary | ICD-10-CM | POA: Diagnosis present

## 2023-01-21 DIAGNOSIS — K5900 Constipation, unspecified: Secondary | ICD-10-CM | POA: Diagnosis present

## 2023-01-21 DIAGNOSIS — K8012 Calculus of gallbladder with acute and chronic cholecystitis without obstruction: Principal | ICD-10-CM | POA: Diagnosis present

## 2023-01-21 DIAGNOSIS — I10 Essential (primary) hypertension: Secondary | ICD-10-CM | POA: Diagnosis present

## 2023-01-21 DIAGNOSIS — Z79899 Other long term (current) drug therapy: Secondary | ICD-10-CM

## 2023-01-21 DIAGNOSIS — Z8249 Family history of ischemic heart disease and other diseases of the circulatory system: Secondary | ICD-10-CM

## 2023-01-21 DIAGNOSIS — C61 Malignant neoplasm of prostate: Secondary | ICD-10-CM | POA: Diagnosis present

## 2023-01-21 DIAGNOSIS — Z923 Personal history of irradiation: Secondary | ICD-10-CM

## 2023-01-21 DIAGNOSIS — Z7982 Long term (current) use of aspirin: Secondary | ICD-10-CM

## 2023-01-21 DIAGNOSIS — Z8546 Personal history of malignant neoplasm of prostate: Secondary | ICD-10-CM

## 2023-03-19 DIAGNOSIS — R051 Acute cough: Secondary | ICD-10-CM | POA: Diagnosis not present

## 2023-03-19 DIAGNOSIS — R0981 Nasal congestion: Secondary | ICD-10-CM | POA: Diagnosis not present

## 2023-03-19 DIAGNOSIS — J019 Acute sinusitis, unspecified: Secondary | ICD-10-CM | POA: Diagnosis not present

## 2023-05-17 ENCOUNTER — Encounter: Payer: Self-pay | Admitting: Family Medicine

## 2023-05-17 ENCOUNTER — Ambulatory Visit (INDEPENDENT_AMBULATORY_CARE_PROVIDER_SITE_OTHER): Payer: Medicare Other | Admitting: Family Medicine

## 2023-05-17 ENCOUNTER — Encounter (HOSPITAL_BASED_OUTPATIENT_CLINIC_OR_DEPARTMENT_OTHER): Payer: Self-pay

## 2023-05-17 ENCOUNTER — Ambulatory Visit (HOSPITAL_BASED_OUTPATIENT_CLINIC_OR_DEPARTMENT_OTHER)
Admission: RE | Admit: 2023-05-17 | Discharge: 2023-05-17 | Disposition: A | Discharge status: Home / Self Care | Payer: Medicare Other | Source: Ambulatory Visit | Attending: Family Medicine | Admitting: Family Medicine

## 2023-05-17 VITALS — BP 132/70 | HR 65 | Temp 98.1°F | Resp 16 | Ht 73.0 in | Wt 289.0 lb

## 2023-05-17 DIAGNOSIS — M47819 Spondylosis without myelopathy or radiculopathy, site unspecified: Secondary | ICD-10-CM | POA: Diagnosis not present

## 2023-05-17 DIAGNOSIS — J392 Other diseases of pharynx: Secondary | ICD-10-CM

## 2023-05-17 DIAGNOSIS — E785 Hyperlipidemia, unspecified: Secondary | ICD-10-CM

## 2023-05-17 DIAGNOSIS — J029 Acute pharyngitis, unspecified: Secondary | ICD-10-CM

## 2023-05-17 DIAGNOSIS — I10 Essential (primary) hypertension: Secondary | ICD-10-CM

## 2023-05-17 DIAGNOSIS — M542 Cervicalgia: Secondary | ICD-10-CM | POA: Diagnosis not present

## 2023-05-17 DIAGNOSIS — E1169 Type 2 diabetes mellitus with other specified complication: Secondary | ICD-10-CM

## 2023-05-17 DIAGNOSIS — C61 Malignant neoplasm of prostate: Secondary | ICD-10-CM

## 2023-05-17 LAB — CBC WITH DIFFERENTIAL/PLATELET
Basophils Absolute: 0 10*3/uL (ref 0.0–0.1)
Basophils Relative: 0.5 % (ref 0.0–3.0)
Eosinophils Absolute: 0.2 10*3/uL (ref 0.0–0.7)
Eosinophils Relative: 2.8 % (ref 0.0–5.0)
HCT: 44.7 % (ref 39.0–52.0)
Hemoglobin: 14.8 g/dL (ref 13.0–17.0)
Lymphocytes Relative: 25.6 % (ref 12.0–46.0)
Lymphs Abs: 1.4 10*3/uL (ref 0.7–4.0)
MCHC: 33.1 g/dL (ref 30.0–36.0)
MCV: 93.7 fL (ref 78.0–100.0)
Monocytes Absolute: 0.6 10*3/uL (ref 0.1–1.0)
Monocytes Relative: 11.1 % (ref 3.0–12.0)
Neutro Abs: 3.2 10*3/uL (ref 1.4–7.7)
Neutrophils Relative %: 60 % (ref 43.0–77.0)
Platelets: 220 10*3/uL (ref 150.0–400.0)
RBC: 4.77 Mil/uL (ref 4.22–5.81)
RDW: 14.6 % (ref 11.5–15.5)
WBC: 5.4 10*3/uL (ref 4.0–10.5)

## 2023-05-17 LAB — HEMOGLOBIN A1C: Hgb A1c MFr Bld: 6.5 % (ref 4.6–6.5)

## 2023-05-17 LAB — LIPID PANEL
Cholesterol: 168 mg/dL (ref 0–200)
HDL: 42.7 mg/dL (ref >39.00)
LDL Cholesterol: 107 mg/dL — ABNORMAL HIGH (ref 0–99)
NonHDL: 125.21
Total CHOL/HDL Ratio: 4
Triglycerides: 93 mg/dL (ref 0.0–149.0)
VLDL: 18.6 mg/dL (ref 0.0–40.0)

## 2023-05-17 LAB — BASIC METABOLIC PANEL
BUN: 28 mg/dL — ABNORMAL HIGH (ref 6–23)
CO2: 32 meq/L (ref 19–32)
Calcium: 9.2 mg/dL (ref 8.4–10.5)
Chloride: 100 meq/L (ref 96–112)
Creatinine, Ser: 0.83 mg/dL (ref 0.40–1.50)
GFR: 87.76 mL/min (ref >60.00)
Glucose, Bld: 126 mg/dL — ABNORMAL HIGH (ref 70–99)
Potassium: 4 meq/L (ref 3.5–5.1)
Sodium: 140 meq/L (ref 135–145)

## 2023-05-17 LAB — MICROALBUMIN / CREATININE URINE RATIO
Creatinine,U: 167.4 mg/dL
Microalb Creat Ratio: 1.8 mg/g (ref 0.0–30.0)
Microalb, Ur: 3.1 mg/dL — ABNORMAL HIGH (ref 0.0–1.9)

## 2023-05-17 MED ORDER — IOHEXOL 300 MG/ML  SOLN
100.0000 mL | Freq: Once | INTRAMUSCULAR | Status: AC | PRN
  Administered 2023-05-17: 75 mL via INTRAVENOUS

## 2023-05-17 MED ORDER — AMOXICILLIN-POT CLAVULANATE 875-125 MG PO TABS
1.0000 | ORAL_TABLET | Freq: Two times a day (BID) | ORAL | 0 refills | Status: AC
Start: 2023-05-17 — End: 2023-05-24

## 2023-05-17 MED ORDER — ATORVASTATIN CALCIUM 20 MG PO TABS: 20.0000 mg | ORAL_TABLET | Freq: Every day | ORAL | 3 refills | Status: DC

## 2023-05-17 NOTE — Assessment & Plan Note (Signed)
Last hemoglobin A1c 6.7 in 04/2021. Continue nonpharmacologic treatment.Further recommendations according to HgA1C done today. Annual eye exam, periodic dental and foot care to continue.  F/U in 5-6 months.

## 2023-05-17 NOTE — Addendum Note (Signed)
Addended by: [name], Travaughn Vue G on: 05/17/2023 05:15 PM   Modules accepted: Level of Service

## 2023-05-17 NOTE — Assessment & Plan Note (Signed)
Reports that he has been taking Atorvastatin 20 mg daily, alst Rx 09/2022 #90/2. Low fat diet to also continue. Further recommendations will be given according to lipid panel result.

## 2023-05-17 NOTE — Assessment & Plan Note (Signed)
S/P radioactive seed implant/brachytherapy on 09/04/22. Following with urologist.

## 2023-05-17 NOTE — Assessment & Plan Note (Signed)
Mildly elevated today. For now continue lisinopril-HCTZ 2025 mg daily. Instructed to monitor BP regularly. Continue low-salt diet. Eye exam is due.

## 2023-05-17 NOTE — Patient Instructions (Addendum)
A few things to remember from today's visit:  Hypertension, essential, benign - Plan: Basic metabolic panel  Type 2 diabetes mellitus with other specified complication, without long-term current use of insulin (HCC) - Plan: Basic metabolic panel, Hemoglobin A1c, Microalbumin / creatinine urine ratio  Hyperlipidemia, unspecified hyperlipidemia type - Plan: Lipid panel  Hypothyroidism, postsurgical  Sore throat - Plan: CT Soft Tissue Neck W Contrast, amoxicillin-clavulanate (AUGMENTIN) 875-125 MG tablet  Pharyngeal ulcer - Plan: CT Soft Tissue Neck W Contrast, amoxicillin-clavulanate (AUGMENTIN) 875-125 MG tablet, Ambulatory referral to ENT  We need to be sure lesion in throat is not something more serious. I placed a referral to see ENT and for imaging. Antibiotic started today. Over the counter probiotic like Align 1 cap daily to prevent diarrhea for antibiotic.  If you need refills for medications you take chronically, please call your pharmacy. Do not use My Chart to request refills or for acute issues that need immediate attention. If you send a my chart message, it may take a few days to be addressed, specially if I am not in the office.  Please be sure medication list is accurate. If a new problem present, please set up appointment sooner than planned today.

## 2023-05-17 NOTE — Progress Notes (Addendum)
ACUTE VISIT Chief Complaint  Patient presents with   Sinus Problem    Jaw & ear pain    HPI: [name] is a 73 y.o. male with a PMHx significant for HTN, OSA, DM II, hypothyroidism, prostate cancer, and HLD, who is here today complaining of jaw pain.  Jaw/maxillary pain:  Patient says  he has been having right-sided jaw/maxillary pain for a few months. He says the pain happens when he tries to cough something up, it does not happen when swallowing or talking.  He saw his dentist, and was told there was no problem with his teeth.  He has not been taking anything for the pain.  He has a hx of chewing tobacco for many years. He says he is weaning himself off of it.  Says the pain is unchanged since its onset.  Patient mentions he had an upper respiratory infection about 2 months ago. Most of his symptoms are resolving. Still has some post nasal drainage. Pertinent negatives include abnormal weight loss, fever, or chills.   Diabetes Mellitus II: Diagnosed in 01/2020 with a hemoglobin A1c of 6.5  -He has not been checking his blood sugar.  - Not currently on pharmacologic treatment.  - Diet: He has not made any changes to his diet.  - eye exam: He hasn't had an eye exam for awhile.  - foot exam: Over a year ago.  - Negative for symptoms of hypoglycemia, polyuria, polydipsia, numbness extremities, foot ulcers/trauma  Lab Results  Component Value Date   HGBA1C 6.7 (H) 05/09/2021   Lab Results  Component Value Date   MICROALBUR 0.9 05/09/2021   Hypertension:  Medications: Currently on lisinopril-hydrochlorothiazide 20-25 mg daily.  BP readings at home: He has not been checking his BP at home.  Side effects: none Negative for unusual or severe headache, visual changes, exertional chest pain, dyspnea,  focal weakness, or worsening edema.  Lab Results  Component Value Date   CREATININE 0.93 09/24/2022   BUN 24 (H) 09/24/2022   NA 138 09/24/2022   K 3.2 (L) 09/24/2022    CL 102 09/24/2022   CO2 28 09/24/2022   Prostate cancer:  Still following with his urologist every 6 months.  S/P radioactive seed implant/brachytherapy. He is somewhat frustrated that he has not returned to his baseline since having a seed implant on 09/04/2022.   Review of Systems  Constitutional:  Negative for activity change, appetite change and fever.  HENT:  Negative for mouth sores and trouble swallowing.   Respiratory:  Negative for cough and wheezing.   Gastrointestinal:  Negative for abdominal pain, nausea and vomiting.  Genitourinary:  Negative for decreased urine volume, dysuria and hematuria.  Musculoskeletal:  Negative for myalgias and neck pain.  Skin:  Negative for rash.  Neurological:  Negative for syncope and facial asymmetry.  See other pertinent positives and negatives in HPI.  Current Outpatient Medications on File Prior to Visit  Medication Sig Dispense Refill   aspirin EC 81 MG tablet Take 81 mg by mouth daily. Swallow whole.     atorvastatin (LIPITOR) 20 MG tablet Take 1 tablet (20 mg total) by mouth daily. 90 tablet 2   levothyroxine (SYNTHROID) 88 MCG tablet TAKE ONE TABLET BY MOUTH DAILY. 90 tablet 2   lisinopril-hydrochlorothiazide (ZESTORETIC) 20-25 MG tablet Take 1 tablet by mouth daily. 90 tablet 2   solifenacin (VESICARE) 5 MG tablet Take 5 mg by mouth daily.     tamsulosin (FLOMAX) 0.4 MG CAPS capsule  Take 1 capsule (0.4 mg total) by mouth 2 (two) times daily after a meal. 60 capsule 2   No current facility-administered medications on file prior to visit.    Past Medical History:  Diagnosis Date   Arthritis    Cancer (HCC)    History of partial thyroidectomy STATES OVERACTIVE THYROID-- NO ISSUES SINCE AGE 68 AND NO MEDS   Hyperlipidemia    Hypertension    Hypothyroidism    Sleep apnea    Per patient he tried CPAP but could not tolerate.   Thyroid disease    Allergies  Allergen Reactions   Testosterone Other (See Comments)    headache     Social History   Socioeconomic History   Marital status: Not on file    Spouse name: Not on file   Number of children: Not on file   Years of education: Not on file   Highest education level: Not on file  Occupational History   Not on file  Tobacco Use   Smoking status: Never   Smokeless tobacco: Current    Types: Chew   Tobacco comments:    started chewing tobacco off and on since 1970  Vaping Use   Vaping status: Never Used  Substance and Sexual Activity   Alcohol use: Never   Drug use: No   Sexual activity: Not Currently  Other Topics Concern   Not on file  Social History Narrative   Not on file   Social Drivers of Health   Financial Resource Strain: Low Risk  (10/08/2021)   Overall Financial Resource Strain (CARDIA)    Difficulty of Paying Living Expenses: Not hard at all  Food Insecurity: No Food Insecurity (07/17/2022)   Hunger Vital Sign    Worried About Running Out of Food in the Last Year: Never true    Ran Out of Food in the Last Year: Never true  Transportation Needs: No Transportation Needs (07/17/2022)   PRAPARE - Administrator, Civil Service (Medical): No    Lack of Transportation (Non-Medical): No  Physical Activity: Inactive (10/08/2021)   Exercise Vital Sign    Days of Exercise per Week: 0 days    Minutes of Exercise per Session: 0 min  Stress: No Stress Concern Present (10/08/2021)   [employer] of Occupational Health - Occupational Stress Questionnaire    Feeling of Stress : Not at all  Social Connections: Socially Isolated (10/08/2021)   Social Connection and Isolation Panel [NHANES]    Frequency of Communication with Friends and Family: More than three times a week    Frequency of Social Gatherings with Friends and Family: More than three times a week    Attends Religious Services: Never    Database administrator or Organizations: No    Attends Banker Meetings: Never    Marital Status: Divorced    Vitals:    05/17/23 0849  BP: 132/70  Pulse: 65  Resp: 16  Temp: 98.1 F (36.7 C)  SpO2: 95%   Body mass index is 38.13 kg/m.  Physical Exam Vitals and nursing note reviewed.  Constitutional:      General: He is not in acute distress.    Appearance: He is well-developed.  HENT:     Head: Normocephalic and atraumatic.     Right Ear: Tympanic membrane, ear canal and external ear normal.     Left Ear: Tympanic membrane, ear canal and external ear normal.     Nose: No congestion or  rhinorrhea.     Right Sinus: No maxillary sinus tenderness.     Left Sinus: No maxillary sinus tenderness.     Mouth/Throat:     Mouth: Mucous membranes are moist.     Pharynx: Pharyngeal swelling and posterior oropharyngeal erythema present. No oropharyngeal exudate.      Comments:   Eyes:     Conjunctiva/sclera: Conjunctivae normal.  Cardiovascular:     Rate and Rhythm: Normal rate and regular rhythm.     Pulses:          Dorsalis pedis pulses are 2+ on the right side and 2+ on the left side.     Heart sounds: No murmur heard. Pulmonary:     Effort: Pulmonary effort is normal. No respiratory distress.     Breath sounds: Normal breath sounds.  Abdominal:     Palpations: Abdomen is soft. There is no hepatomegaly or mass.     Tenderness: There is no abdominal tenderness.  Lymphadenopathy:     Head:     Right side of head: Submandibular (Mildly enlarged,not tender.) adenopathy present.     Cervical: No cervical adenopathy.  Skin:    General: Skin is warm.     Findings: No erythema or rash.  Neurological:     Mental Status: He is alert and oriented to person, place, and time.     Cranial Nerves: No cranial nerve deficit.     Gait: Gait normal.  Psychiatric:        Mood and Affect: Mood and affect normal.   ASSESSMENT AND PLAN:  Mr. Alred was seen today for jaw pain and follow up.  Lab Results  Component Value Date   HGBA1C 6.5 05/17/2023   Lab Results  Component Value Date   WBC 5.4  05/17/2023   HGB 14.8 05/17/2023   HCT 44.7 05/17/2023   MCV 93.7 05/17/2023   PLT 220.0 05/17/2023   Lab Results  Component Value Date   MICROALBUR 3.1 (H) 05/17/2023   MICROALBUR 0.9 05/09/2021   Lab Results  Component Value Date   CHOL 168 05/17/2023   HDL 42.70 05/17/2023   LDLCALC 107 (H) 05/17/2023   TRIG 93.0 05/17/2023   CHOLHDL 4 05/17/2023   Type 2 diabetes mellitus with other specified complication, without long-term current use of insulin (HCC) Assessment & Plan: Last hemoglobin A1c 6.7 in 04/2021. Continue nonpharmacologic treatment. Further recommendations according to HgA1C done today. Annual eye exam (overdue), periodic dental and foot care to continue.  F/U in 5-6 months.  Orders: -     Basic metabolic panel; Future -     Hemoglobin A1c; Future -     Microalbumin / creatinine urine ratio; Future  Sore throat We discussed possible etiologies. Problem has been going to for at least 2 months, differential include abscess and malignancy. Started on Augmentin bid x 10 days. Instructed about warning signs.  -     CT SOFT TISSUE NECK W CONTRAST; Future -     Amoxicillin-Pot Clavulanate; Take 1 tablet by mouth 2 (two) times daily for 7 days.  Dispense: 14 tablet; Refill: 0 -     CBC with Differential/Platelet; Future  Pharyngeal ulcer Right-sided + chewing tobacco raise the concerned for malignancy. Stat CT and ENT referral placed.  -     CT SOFT TISSUE NECK W CONTRAST; Future -     Amoxicillin-Pot Clavulanate; Take 1 tablet by mouth 2 (two) times daily for 7 days.  Dispense: 14 tablet; Refill: 0 -  Ambulatory referral to ENT -     CBC with Differential/Platelet; Future  Hyperlipidemia, unspecified hyperlipidemia type Assessment & Plan: Reports that he has been taking Atorvastatin 20 mg daily, alst Rx 09/2022 #90/2. Low fat diet to also continue. Further recommendations will be given according to lipid panel result.  Orders: -     Lipid panel;  Future  Malignant neoplasm of prostate [facility]) Assessment & Plan: S/P radioactive seed implant/brachytherapy on 09/04/22. Following with urologist.  Hypertension, essential, benign Assessment & Plan: Mildly elevated today. For now continue lisinopril-HCTZ 2025 mg daily. Instructed to monitor BP regularly. Continue low-salt diet. Eye exam is due.  Orders: -     Basic metabolic panel; Future   Sof tissue/neck CT report is back: 1. 3.6 cm ulcerated right-sided superior oropharyngeal mass compatible with malignancy. 2. No evidence of cervical lymphadenopathy.  ENT appt pending.  I spent a total of 40 minutes in both face to face and non face to face activities for this visit on the date of this encounter. During this time history was obtained and documented, examination was performed, prior labs reviewed, and assessment/plan discussed.  Return in about 5 months (around 10/15/2023).  I, Rolla Etienne Wierda, acting as a scribe for Rashunda Passon [name], MD., have documented all relevant documentation on the behalf of Reene Harlacher [name], MD, as directed by  Davis Ambrosini [name], MD while in the presence of Michol Emory [name], MD.   I, Bonnie Overdorf [name], MD, have reviewed all documentation for this visit. The documentation on 05/17/23 for the exam, diagnosis, procedures, and orders are all accurate and complete.  Julius Boniface G. [name], MD  [facility]. [place] office.

## 2023-05-20 ENCOUNTER — Encounter: Payer: Self-pay | Admitting: Family Medicine

## 2023-05-21 ENCOUNTER — Telehealth: Payer: Self-pay

## 2023-05-21 ENCOUNTER — Other Ambulatory Visit: Payer: Self-pay | Admitting: Family Medicine

## 2023-05-21 DIAGNOSIS — J029 Acute pharyngitis, unspecified: Secondary | ICD-10-CM

## 2023-05-21 MED ORDER — MAGIC MOUTHWASH W/LIDOCAINE: 5.0000 mL | Freq: Three times a day (TID) | ORAL | 0 refills | Status: DC | PRN

## 2023-05-21 MED ORDER — MAGIC MOUTHWASH W/LIDOCAINE: 5.0000 mL | Freq: Three times a day (TID) | ORAL | 0 refills | Status: AC | PRN

## 2023-05-21 NOTE — Telephone Encounter (Signed)
Copied from CRM [id number]. Topic: Clinical - Medical Advice >> May 21, 2023  1:14 PM Sonny Dandy B wrote: Reason for CRM: Pt's wife called to advise pt is still have throat pain, flem in his throat. States it hurt to cough. Would like provider to prescribe some medication to help with the pain until he see the ent. Dr. 2/24 please call pt back at [phone]

## 2023-05-21 NOTE — Addendum Note (Signed)
Addended by: Kathreen Devoid on: 05/21/2023 02:57 PM   Modules accepted: Orders

## 2023-05-21 NOTE — Telephone Encounter (Signed)
He can try mouth wash with lidocaine, Rx faxed to his pharmacy. Tylenol 500 gm 3-4 times per day. Thanks, BJ

## 2023-05-21 NOTE — Telephone Encounter (Signed)
 See my chart encounter.

## 2023-05-25 ENCOUNTER — Ambulatory Visit: Payer: Self-pay | Admitting: Family Medicine

## 2023-05-25 NOTE — Telephone Encounter (Signed)
ENT has moved pt up to 2/6.

## 2023-05-25 NOTE — Telephone Encounter (Signed)
  Chief Complaint: bleeding from throat Symptoms:spitting up blood  Frequency: once after waking up  Disposition: [x] ED /[] Urgent Care (no appt availability in office) / [] Appointment(In office/virtual)/ []  [facility]/ [] Home Care/ [] Refused Recommended Disposition /[]  Mobile Bus/ []  Follow-up with PCP Additional Notes: Pt wife [name] called over concerns of pt spitting up blood. Pt states it's only once when he wakes up and doesn't notice it during the day. Pt states it is very painful to eat, but soft foods aren't as bothersome. [name] stated the pt isn't a good drinker. Pt denies dizziness or lightheadedness. Pt was seen in office on 1/27 and CT was completed. Possible ulcers and waiting to be seen by ENT on 2/13. Per protocol, RN advised pt to go to ED. Pt refused. CAL notified. RN explained importance to wife [name] and she verbalized understanding. I'm going to try and talk him in to going over to the [facility] ED.          Copied from CRM [id number]. Topic: Clinical - Red Word Triage >> May 25, 2023  9:23 AM Deaijah H wrote: Red Word that prompted transfer to Nurse Triage: Patients wife called in stating Throat is bleeding / not getting anywhere with ENT would like to have another referral that could possibly see patient sooner Reason for Disposition  [1] Drinking very little AND [2] dehydration suspected (e.g., no urine > 12 hours, very dry mouth, very lightheaded)  Answer Assessment - Initial Assessment Questions 1. LOCATION: Where is the ulcer located?      In throat  2. NUMBER: How many ulcers are there?      Unsure  3. SIZE: How large is the ulcer?      Unsure  4. SEVERITY: Are they painful? If Yes, ask: How bad is it?  (Scale 1-10; or mild, moderate, severe)  - MILD - eating  and drinking normally   - MODERATE - decreased liquid intake   - SEVERE - drinking very little      Moderate  5. ONSET: When did you first notice the ulcer?       Early Nov 6. RECURRENT SYMPTOM: Have you had a mouth ulcer before? If Yes, ask: When was the last time? and What happened that time?      denies 7. CAUSE: What do you think is causing the mouth ulcer?     Unsure  8. OTHER SYMPTOMS: Do you have any other symptoms? (e.g., fever)     Spitting up blood; painful to eat  Protocols used: Mouth Ulcers-A-AH

## 2023-05-27 ENCOUNTER — Encounter (INDEPENDENT_AMBULATORY_CARE_PROVIDER_SITE_OTHER): Payer: Self-pay | Admitting: Otolaryngology

## 2023-05-27 ENCOUNTER — Other Ambulatory Visit (HOSPITAL_COMMUNITY)
Admission: RE | Admit: 2023-05-27 | Discharge: 2023-05-27 | Disposition: A | Discharge status: Home / Self Care | Payer: Medicare Other | Source: Ambulatory Visit | Attending: Otolaryngology | Admitting: Otolaryngology

## 2023-05-27 ENCOUNTER — Ambulatory Visit (INDEPENDENT_AMBULATORY_CARE_PROVIDER_SITE_OTHER): Payer: Medicare Other | Admitting: Otolaryngology

## 2023-05-27 VITALS — BP 127/69 | HR 78

## 2023-05-27 DIAGNOSIS — D3705 Neoplasm of uncertain behavior of pharynx: Secondary | ICD-10-CM | POA: Diagnosis not present

## 2023-05-27 DIAGNOSIS — R1310 Dysphagia, unspecified: Secondary | ICD-10-CM | POA: Diagnosis not present

## 2023-05-27 DIAGNOSIS — F17220 Nicotine dependence, chewing tobacco, uncomplicated: Secondary | ICD-10-CM | POA: Diagnosis not present

## 2023-05-27 DIAGNOSIS — J392 Other diseases of pharynx: Secondary | ICD-10-CM | POA: Diagnosis not present

## 2023-05-27 DIAGNOSIS — Z87898 Personal history of other specified conditions: Secondary | ICD-10-CM

## 2023-05-27 DIAGNOSIS — H9201 Otalgia, right ear: Secondary | ICD-10-CM | POA: Diagnosis not present

## 2023-05-27 DIAGNOSIS — C109 Malignant neoplasm of oropharynx, unspecified: Secondary | ICD-10-CM

## 2023-05-27 MED ORDER — ACETAMINOPHEN 500 MG PO TABS: 500.0000 mg | ORAL_TABLET | Freq: Four times a day (QID) | ORAL | 1 refills | Status: DC

## 2023-05-27 MED ORDER — IBUPROFEN 600 MG PO TABS: 600.0000 mg | ORAL_TABLET | Freq: Four times a day (QID) | ORAL | 0 refills | Status: DC

## 2023-05-27 MED ORDER — OXYCODONE HCL 5 MG PO TABS: 5.0000 mg | ORAL_TABLET | ORAL | 0 refills | Status: DC | PRN

## 2023-05-27 NOTE — Progress Notes (Addendum)
 ENT CONSULT:  Reason for Consult: throat pain and odynophagia    HPI: Discussed the use of AI scribe software for clinical note transcription with the patient, who gave verbal consent to proceed.  History of Present Illness   Isaac Carpenter is a 73 year old male who presents with throat pain and difficulty swallowing.  He has been experiencing throat pain and difficulty swallowing for the past two to three months, which began after a cold. The pain worsens when he attempts to cough up phlegm. The severity of the pain has led him to stop eating and drinking as of yesterday. Previously, he was able to consume most foods, but the pain has intensified.He also has pain in his right ear.   He has a history of coughing up blood, with episodes occurring two days ago and five days ago. He describes waking up with a mouth full of blood, which he then rinses out.   He quit chewing tobacco two to three weeks ago and has a remote history of smoking from fifty years ago. He denies heavy alcohol use.  His past medical history includes sleep apnea, high blood pressure, and hypothyroidism for which he takes Synthroid . He has a history of prostate cancer and type 2 diabetes, although he mentions being possibly prediabetic with a good last A1c. He had CT neck done with contrast with evidence of a large right oropharyngeal mass concerning for malignancy.     Records Reviewed:  PCP office visit 05/17/23 [name] is a 73 y.o. male with a PMHx significant for HTN, OSA, DM II, hypothyroidism, prostate cancer, and HLD, who is here today complaining of jaw pain.   Jaw/maxillary pain:  Patient says  he has been having right-sided jaw/maxillary pain for a few months. He says the pain happens when he tries to cough something up, it does not happen when swallowing or talking.   He saw his dentist, and was told there was no problem with his teeth.  He has not been taking anything for the pain.  He has a  hx of chewing tobacco for many years. He says he is weaning himself off of it.  Says the pain is unchanged since its onset.  Patient mentions he had an upper respiratory infection about 2 months ago. Most of his symptoms are resolving. Still has some post nasal drainage. Pertinent negatives include abnormal weight loss, fever, or chills.    Sore throat We discussed possible etiologies. Problem has been going to for at least 2 months, differential include abscess and malignancy. Started on Augmentin  bid x 10 days. Instructed about warning signs.   -     CT SOFT TISSUE NECK W CONTRAST; Future -     Amoxicillin -Pot Clavulanate; Take 1 tablet by mouth 2 (two) times daily for 7 days.  Dispense: 14 tablet; Refill: 0 -     CBC with Differential/Platelet; Future  Pharyngeal ulcer Right-sided + chewing tobacco raise the concerned for malignancy. Stat CT and ENT referral placed.   -     CT SOFT TISSUE NECK W CONTRAST; Future -     Amoxicillin -Pot Clavulanate; Take 1 tablet by mouth 2 (two) times daily for 7 days.  Dispense: 14 tablet; Refill: 0 -     Ambulatory referral to ENT -     CBC with Differential/Platelet; Future   Past Medical History:  Diagnosis Date   Arthritis    Cancer Isaac Carpenter)    History of partial thyroidectomy STATES OVERACTIVE  THYROID -- NO ISSUES SINCE AGE 89 AND NO MEDS   Hyperlipidemia    Hypertension    Hypothyroidism    Sleep apnea    Per patient he tried CPAP but could not tolerate.   Thyroid  disease     Past Surgical History:  Procedure Laterality Date   CIRCUMCISION  01/04/2012   Procedure: CIRCUMCISION ADULT;  Surgeon: Arlena LILLETTE Gal, MD;  Location: [facility];  Service: Urology;  Laterality: N/A;   COLONOSCOPY  04/08/2020   Isaac Carpenter   CYSTOSCOPY  09/04/2022   Procedure: CYSTOSCOPY;  Surgeon: Matilda Senior, MD;  Location: [facility];  Service: Urology;;   RADIOACTIVE SEED IMPLANT N/A 09/04/2022   Procedure:  RADIOACTIVE SEED IMPLANT/BRACHYTHERAPY IMPLANT;  Surgeon: Matilda Senior, MD;  Location: [facility];  Service: Urology;  Laterality: N/A;  90 MINS   SPACE OAR INSTILLATION N/A 09/04/2022   Procedure: SPACE OAR INSTILLATION;  Surgeon: Matilda Senior, MD;  Location: [facility];  Service: Urology;  Laterality: N/A;   THYROIDECTOMY, PARTIAL  AGE 89   OVERACTIVE THYROID     Family History  Problem Relation Age of Onset   Cancer Mother    COPD Father    Heart attack Father    Cancer Sister    Cancer Brother    Cancer Sister    Cancer Brother    Colon cancer Neg Hx    Colon polyps Neg Hx    Esophageal cancer Neg Hx    Rectal cancer Neg Hx    Stomach cancer Neg Hx     Social History:  reports that he has never smoked. His smokeless tobacco use includes chew. He reports that he does not drink alcohol and does not use drugs.  Allergies:  Allergies  Allergen Reactions   Testosterone Other (See Comments)    headache    Medications: I have reviewed the patient's current medications.  The PMH, PSH, Medications, Allergies, and SH were reviewed and updated.  ROS: Constitutional: Negative for fever, weight loss and weight gain. Cardiovascular: Negative for chest pain and dyspnea on exertion. Respiratory: Is not experiencing shortness of breath at rest. Gastrointestinal: Negative for nausea and vomiting. Neurological: Negative for headaches. Psychiatric: The patient is not nervous/anxious  Blood pressure 127/69, pulse 78, SpO2 98%.  PHYSICAL EXAM:  Exam: General: Well-developed, well-nourished Communication and Voice: raspy muffled Respiratory Respiratory effort: Equal inspiration and expiration without stridor Cardiovascular Peripheral Vascular: Warm extremities with equal color/perfusion Eyes: No nystagmus with equal extraocular motion bilaterally Neuro/Psych/Balance: Patient oriented to person, place, and time; Appropriate mood and  affect; Gait is intact with no imbalance; Cranial nerves I-XII are intact Head and Face Inspection: Normocephalic and atraumatic without mass or lesion Palpation: Facial skeleton intact without bony stepoffs Salivary Glands: No mass or tenderness Facial Strength: Facial motility symmetric and full bilaterally ENT Pinna: External ear intact and fully developed External canal: Canal is patent with intact skin Tympanic Membrane: Clear and mobile External Nose: No scar or anatomic deformity Internal Nose: Septum is deviated to the left. No polyp, or purulence. Mucosal edema and erythema present.  Bilateral inferior turbinate hypertrophy.  Lips, Teeth, and gums: Mucosa and teeth intact and viable TMJ: No pain to palpation with full mobility Oral cavity/oropharynx: Ulcerative exophytic mass occupying right tonsillar fossa with extension along the soft palate and uvula involvement Nasopharynx: No mass or lesion with intact mucosa Hypopharynx: Intact mucosa without pooling of secretions Larynx Glottic: Full true vocal cord mobility without lesion or  mass Supraglottic: Normal appearing epiglottis and AE folds Interarytenoid Space: Moderate pachydermia&edema Subglottic Space: Patent without lesion or edema Neck Neck and Trachea: Midline trachea without mass or lesion Thyroid : No mass or nodularity Lymphatics: No lymphadenopathy  Procedures:  Preoperative diagnosis: right oropharyngeal mass  Postoperative diagnosis:   Same  Procedure: Flexible fiberoptic laryngoscopy  Surgeon: Elena Larry, MD  Anesthesia: Topical lidocaine  and Afrin Complications: None Condition is stable throughout exam  Indications and consent:  The patient presents to the clinic with right oropharyngeal mass. Indirect laryngoscopy view was incomplete. Thus it was recommended that they undergo a flexible fiberoptic laryngoscopy. All of the risks, benefits, and potential complications were reviewed with the  patient preoperatively and verbal informed consent was obtained.  Procedure: The patient was seated upright in the clinic. Topical lidocaine  and Afrin were applied to the nasal cavity. After adequate anesthesia had occurred, I then proceeded to pass the flexible telescope into the nasal cavity. The nasal cavity was patent without rhinorrhea or polyp. The nasopharynx was also patent but there was a mass noted along the right aspect of the soft palate. The base of tongue was visualized and was normal. There were no signs of pooling of secretions in the piriform sinuses. The true vocal folds were mobile bilaterally. There were no signs of glottic or supraglottic mucosal lesion or mass. There was severe interarytenoid pachydermia and post cricoid edema. The telescope was then slowly withdrawn and the patient tolerated the procedure throughout.   Procedure: oropharyngeal mass biopsy right side CPT 42800  ATTENDING: Elena Larry, M.D.   PREOPERATIVE DIAGNOSIS(ES): right oropharyngeal mass  POSTOPERATIVE DIAGNOSIS(ES): Same  PROCEDURE PERFORMED: biopsy of the right oropharyngeal mass  INDICATIONS FOR PROCEDURE: Patient presented with friable mass along the right oropharynx.  The risks and benefits of the surgical procedure have been explained in detail to the patient and they have elected to proceed.  CONSENT:  Informed consent was obtained prior to the procedure after discussion of risks, benefits, and alternatives and expected outcomes were discussed with the patient; consent placed in chart. The possibilities of reaction to medication, bleeding, infection, inadequate biopsy, the need for additional procedures, failure to diagnose a condition, and creating a complication requiring transfusion or operation were discussed with the patient. The patient concurred with the proposed plan, giving informed consent.    UNIVERSAL PROTOCOL/ TIMEOUT: Preprocedure verification is complete- patient verified and  consents confirmed.  H&P REVIEW: The patient's history and physical were reviewed today prior to procedure. All medications were reviewed and updated as well.  ANESTHESIA: local anesthesia with 1% lidocaine , 1:100,000 epinephrine   PROCEDURE DETAILS: The patient was brought to the clinic and placed in a seated position.  The mucosa over the lesion was anesthetized.  Using cup biopsy forceps we took several samples of the friable tissue from the right oropharyngeal mass.  It was sent for permanent pathology. Hemostasis was obtained using Afrin. There were no complications and the patient tolerated the procedure well.  ESTIMATED BLOOD LOSS: Minimal  SPECIMEN(S) REMOVED: Right oropharyngeal mass  DISPOSITION OF SPECIMEN(S): Permanent pathology  FINDINGS:  No evidence of significant bleeding or other complications  CONDITION: Stable  COMPLICATIONS:The patient tolerated the procedure well without apparent complications and was ambulatory.  Studies Reviewed: CT neck with contrast 05/17/23 FINDINGS: Pharynx and larynx: An ulcerated enhancing mass involving the superior oropharynx and soft palate/uvula measures 3.4 x 3.0 x 3.6 cm. The mass is centered on the right but does mildly cross midline.   Salivary glands: No  inflammation, mass, or stone.   Thyroid : Diffusely lobular contour without a dominant lesion apparent by CT.   Lymph nodes: No enlarged or suspicious lymph nodes in the neck.   Vascular: Major vascular structures of the neck are patent.   Limited intracranial: Unremarkable.   Visualized orbits: Bilateral cataract extraction.   Mastoids and visualized paranasal sinuses: Mild mucosal thickening in the maxillary sinuses. Clear mastoid air cells.   Skeleton: No suspicious bone lesion. Spondylosis, including bridging anterior vertebral osteophytes throughout the lower cervical and included upper thoracic spine.   Upper chest: Clear lung apices.   Other: None.    IMPRESSION: 1. 3.6 cm ulcerated right-sided superior oropharyngeal mass compatible with malignancy. 2. No evidence of cervical lymphadenopathy.     Assessment/Plan: Encounter Diagnoses  Name Primary?   Oropharyngeal mass Yes   Neoplasm of uncertain behavior of oropharynx    Chewing tobacco dependence    Odynophagia    Otalgia of right ear    History of hemoptysis    Assessment and Plan    Oropharyngeal Mass right side, likely malignant Presents with a concerning right oropharyngeal mass, likely malignant, as suggested by imaging and exam. Symptoms include severe odynophagia, dysphagia, and occasional hemoptysis, leading to decreased food intake. We biopsied the mass today, and sent for permanent pathology and p16 staining. He is a former smoker and quit chewing tobacco a couple of weeks ago. Emphasized pain management to facilitate nutrition. We discussed that after biopsy results are available, we will proceed with PET/CT and tumor board discussion of his case.   - F/u bx results  - Order PET CT scan once bx results are available - Prescribe analgesics to facilitate eating (Extra strength Tylenol /Motrin  Q6hrs and Oxy 5 mg Q6hrs PRN)  General Health Maintenance Quit chewing tobacco two to three weeks ago. No history of heavy alcohol use or recent smoking.  - we discussed importance of avoiding tobacco chewing.     F/u - RTC 2 weeks  Thank you for allowing me to participate in the care of this patient. Please do not hesitate to contact me with any questions or concerns.   Elena Larry, MD Otolaryngology [facility] Phone: [phone] Fax: [phone]    05/28/2023, 4:58 AM

## 2023-06-01 ENCOUNTER — Other Ambulatory Visit (INDEPENDENT_AMBULATORY_CARE_PROVIDER_SITE_OTHER): Payer: Self-pay | Admitting: Otolaryngology

## 2023-06-01 ENCOUNTER — Encounter (INDEPENDENT_AMBULATORY_CARE_PROVIDER_SITE_OTHER): Payer: Self-pay | Admitting: Otolaryngology

## 2023-06-01 DIAGNOSIS — C109 Malignant neoplasm of oropharynx, unspecified: Secondary | ICD-10-CM

## 2023-06-02 ENCOUNTER — Telehealth: Payer: Self-pay | Admitting: Radiation Oncology

## 2023-06-02 LAB — SURGICAL PATHOLOGY

## 2023-06-02 NOTE — Telephone Encounter (Signed)
Called patient's spouse to schedule a consultation w. Dr. Basilio Cairo. She requested consult to be after scheduled appointment with Dr. Irene Pap on 2/20, scheduled consult as advised.

## 2023-06-03 ENCOUNTER — Other Ambulatory Visit (INDEPENDENT_AMBULATORY_CARE_PROVIDER_SITE_OTHER): Payer: Self-pay | Admitting: Otolaryngology

## 2023-06-03 ENCOUNTER — Institutional Professional Consult (permissible substitution) (INDEPENDENT_AMBULATORY_CARE_PROVIDER_SITE_OTHER): Payer: Medicare Other | Admitting: Otolaryngology

## 2023-06-03 MED ORDER — OXYCODONE HCL 5 MG PO TABS: 5.0000 mg | ORAL_TABLET | ORAL | 0 refills | Status: DC | PRN

## 2023-06-03 NOTE — Progress Notes (Signed)
Right oropharyngeal cancer, continues to have pain with swallowing. Rx for Oxy refill sent and patient instructed to take Q4hrs with Tylenol and Motrin Q6hrs. Ensure hydration and maintain PO with soft easy to swallow foods.

## 2023-06-03 NOTE — Progress Notes (Incomplete)
Head and Neck Cancer Location of Tumor / Histology:  Squamous Cell Carcinoma of Isaac Adie, MD on 05/27/2023 Patient presented two to three months ago with symptoms of throat pain and difficulty swallowing for the past two to three months which began after a cold. The pain worsens when he attempts to cough up phlegm. The severity of the pain has led him to stop eating and drinking temporarily. Previously, he was able to consume most foods but the pain has intensified. He has a history of coughing up blood and waking up with a mouth full of blood.   Biopsies revealed:    Nutrition Status Yes No Comments  Weight changes? []  []    Swallowing concerns? []  []    PEG? []  []     Referrals Yes No Comments  Social Work? []  []    Dentistry? []  []    Swallowing therapy? []  []    Nutrition? []  []    Med/Onc? []  []     Safety Issues Yes No Comments  Prior radiation? []  []    Pacemaker/ICD? []  []    Possible current pregnancy? []  []    Is the patient on methotrexate? []  []     Tobacco/Marijuana/Snuff/ETOH use:  [place], MD on 05/27/2023 Patient has a history of chewing tobacco for many years. He quit chewing tobacco two to three weeks ago. No history of heavy alcohol use or recent smoking. He has a remote history of smoking from fifty years ago,  Past/Anticipated interventions by otolaryngology, if anyIrene Pap, MD 05/27/2023   Past/Anticipated interventions by medical oncology, if any:      Current Complaints / other details:  ***

## 2023-06-04 ENCOUNTER — Ambulatory Visit (INDEPENDENT_AMBULATORY_CARE_PROVIDER_SITE_OTHER): Payer: Medicare Other | Admitting: Otolaryngology

## 2023-06-04 ENCOUNTER — Encounter (INDEPENDENT_AMBULATORY_CARE_PROVIDER_SITE_OTHER): Payer: Self-pay | Admitting: Otolaryngology

## 2023-06-04 VITALS — BP 130/70 | HR 80

## 2023-06-04 DIAGNOSIS — R1310 Dysphagia, unspecified: Secondary | ICD-10-CM

## 2023-06-04 DIAGNOSIS — F17220 Nicotine dependence, chewing tobacco, uncomplicated: Secondary | ICD-10-CM

## 2023-06-04 DIAGNOSIS — C109 Malignant neoplasm of oropharynx, unspecified: Secondary | ICD-10-CM

## 2023-06-04 DIAGNOSIS — J392 Other diseases of pharynx: Secondary | ICD-10-CM

## 2023-06-04 DIAGNOSIS — H9201 Otalgia, right ear: Secondary | ICD-10-CM

## 2023-06-04 MED ORDER — GABAPENTIN 100 MG PO CAPS: 100.0000 mg | ORAL_CAPSULE | Freq: Three times a day (TID) | ORAL | 3 refills | Status: DC

## 2023-06-04 MED ORDER — LIDOCAINE VISCOUS HCL 2 % MT SOLN: 15.0000 mL | OROMUCOSAL | 1 refills | Status: DC | PRN

## 2023-06-04 NOTE — Progress Notes (Signed)
ENT Progress Note:   Update 06/04/2023  Discussed the use of AI scribe software for clinical note transcription with the patient, who gave verbal consent to proceed.  History of Present Illness   Isaac Carpenter "Isaac Carpenter" is a 73 year old male with oropharyngeal cancer who presents with severe pain and difficulty eating and drinking. He presented a couple weeks ago with hx of right oropharyngeal mass and pain with swallowing, and in-office exam showed a large oropharyngeal mass concerning for malignancy. A biopsy was done in the office, and final pathology was (+) SCCa.   He is experiencing severe pain related to his oropharyngeal cancer, which is severe when swallowing. He describes the sensation as feeling like 'trying to swallow some glass.' The pain has been persistent and is not relieved by his current pain management regimen.  He is struggling to maintain adequate nutrition at home. He consumes only small amounts of food and liquid, such as jelly, and potato soup, and is only able to take sips of water throughout the day.   His current medication regimen includes alternating Tylenol and ibuprofen every six hours, and oxycodone 5 mg every four hours. Despite this, the oxycodone does not alleviate his pain and causes nausea.     Records Reviewed:  Initial Evaluation  Reason for Consult: throat pain and odynophagia    HPI: Discussed the use of AI scribe software for clinical note transcription with the patient, who gave verbal consent to proceed.  History of Present Illness   Isaac Carpenter "Isaac Carpenter" is a 73 year old male who presents with throat pain and difficulty swallowing.  He has been experiencing throat pain and difficulty swallowing for the past two to three months, which began after a cold. The pain worsens when he attempts to cough up phlegm. The severity of the pain has led him to stop eating and drinking as of yesterday. Previously, he was able to consume most foods, but the pain  has intensified.He also has pain in his right ear.   He has a history of coughing up blood, with episodes occurring two days ago and five days ago. He describes waking up with a mouth full of blood, which he then rinses out.   He quit chewing tobacco two to three weeks ago and has a remote history of smoking from fifty years ago. He denies heavy alcohol use.  His past medical history includes sleep apnea, high blood pressure, and hypothyroidism for which he takes Synthroid. He has a history of prostate cancer and type 2 diabetes, although he mentions being possibly prediabetic with a good last A1c. He had CT neck done with contrast with evidence of a large right oropharyngeal mass concerning for malignancy.     Records Reviewed:  PCP office visit 05/17/23 Isaac Carpenter is a 73 y.o. male with a PMHx significant for HTN, OSA, DM II, hypothyroidism, prostate cancer, and HLD, who is here today complaining of jaw pain.   Jaw/maxillary pain:  Patient says  he has been having right-sided jaw/maxillary pain for a few months. He says the pain happens when he tries to cough something up, it does not happen when swallowing or talking.   He saw his dentist, and was told there was no problem with his teeth.  He has not been taking anything for the pain.  He has a hx of chewing tobacco for many years. He says he is weaning himself off of it.  Says the pain is unchanged since  its onset.  Patient mentions he had an upper respiratory infection about 2 months ago. Most of his symptoms are resolving. Still has some post nasal drainage. Pertinent negatives include abnormal weight loss, fever, or chills.    Sore throat We discussed possible etiologies. Problem has been going to for at least 2 months, differential include abscess and malignancy. Started on Augmentin bid x 10 days. Instructed about warning signs.   -     CT SOFT TISSUE NECK W CONTRAST; Future -     Amoxicillin-Pot Clavulanate; Take 1  tablet by mouth 2 (two) times daily for 7 days.  Dispense: 14 tablet; Refill: 0 -     CBC with Differential/Platelet; Future  Pharyngeal ulcer Right-sided + chewing tobacco raise the concerned for malignancy. Stat CT and ENT referral placed.   -     CT SOFT TISSUE NECK W CONTRAST; Future -     Amoxicillin-Pot Clavulanate; Take 1 tablet by mouth 2 (two) times daily for 7 days.  Dispense: 14 tablet; Refill: 0 -     Ambulatory referral to ENT -     CBC with Differential/Platelet; Future   Past Medical History:  Diagnosis Date   Arthritis    Cancer (HCC)    History of partial thyroidectomy STATES OVERACTIVE THYROID-- NO ISSUES SINCE AGE 78 AND NO MEDS   Hyperlipidemia    Hypertension    Hypothyroidism    Sleep apnea    Per patient he tried CPAP but could not tolerate.   Thyroid disease     Past Surgical History:  Procedure Laterality Date   CIRCUMCISION  01/04/2012   Procedure: CIRCUMCISION ADULT;  Surgeon: Kathi Ludwig, MD;  Location: [facility];  Service: Urology;  Laterality: N/A;   COLONOSCOPY  04/08/2020   Vito Cirigliano   CYSTOSCOPY  09/04/2022   Procedure: CYSTOSCOPY;  Surgeon: Marcine Matar, MD;  Location: [facility];  Service: Urology;;   RADIOACTIVE SEED IMPLANT N/A 09/04/2022   Procedure: RADIOACTIVE SEED IMPLANT/BRACHYTHERAPY IMPLANT;  Surgeon: Marcine Matar, MD;  Location: [facility];  Service: Urology;  Laterality: N/A;  90 MINS   SPACE OAR INSTILLATION N/A 09/04/2022   Procedure: SPACE OAR INSTILLATION;  Surgeon: Marcine Matar, MD;  Location: [facility];  Service: Urology;  Laterality: N/A;   THYROIDECTOMY, PARTIAL  AGE 78   OVERACTIVE THYROID    Family History  Problem Relation Age of Onset   Cancer Mother    COPD Father    Heart attack Father    Cancer Sister    Cancer Brother    Cancer Sister    Cancer Brother    Colon cancer Neg Hx    Colon polyps Neg Hx    Esophageal  cancer Neg Hx    Rectal cancer Neg Hx    Stomach cancer Neg Hx     Social History:  reports that he has never smoked. His smokeless tobacco use includes chew. He reports that he does not drink alcohol and does not use drugs.  Allergies:  Allergies  Allergen Reactions   Testosterone Other (See Comments)    headache    Medications: I have reviewed the patient's current medications.  The PMH, PSH, Medications, Allergies, and SH were reviewed and updated.  ROS: Constitutional: Negative for fever, weight loss and weight gain. Cardiovascular: Negative for chest pain and dyspnea on exertion. Respiratory: Is not experiencing shortness of breath at rest. Gastrointestinal: Negative for nausea and vomiting. Neurological: Negative for headaches.  Psychiatric: The patient is not nervous/anxious  Blood pressure 130/70, pulse 80, SpO2 98%.  PHYSICAL EXAM:  Exam: General: Well-developed, well-nourished Communication and Voice: raspy muffled Respiratory Respiratory effort: Equal inspiration and expiration without stridor Cardiovascular Peripheral Vascular: Warm extremities with equal color/perfusion Eyes: No nystagmus with equal extraocular motion bilaterally Neuro/Psych/Balance: Patient oriented to person, place, and time; Appropriate mood and affect; Gait is intact with no imbalance; Cranial nerves I-XII are intact Head and Face Inspection: Normocephalic and atraumatic without mass or lesion Facial Strength: Facial motility symmetric and full bilaterally ENT Pinna: External ear intact and fully developed External canal: Canal is patent with intact skin Tympanic Membrane: Clear and mobile External Nose: No scar or anatomic deformity Internal Nose: Septum is deviated to the left. No polyp, or purulence. Mucosal edema and erythema present.  Bilateral inferior turbinate hypertrophy.  Lips, Teeth, and gums: Mucosa and teeth intact and viable TMJ: No pain to palpation with full  mobility Oral cavity/oropharynx: Ulcerative exophytic mass occupying right tonsillar fossa with extension along the soft palate and uvula involvement, biopsy site fully healed, no bleeding noted  Neck Neck and Trachea: Midline trachea without mass or lesion    Studies Reviewed: CT neck with contrast 05/17/23 FINDINGS: Pharynx and larynx: An ulcerated enhancing mass involving the superior oropharynx and soft palate/uvula measures 3.4 x 3.0 x 3.6 cm. The mass is centered on the right but does mildly cross midline.   Salivary glands: No inflammation, mass, or stone.   Thyroid: Diffusely lobular contour without a dominant lesion apparent by CT.   Lymph nodes: No enlarged or suspicious lymph nodes in the neck.   Vascular: Major vascular structures of the neck are patent.   Limited intracranial: Unremarkable.   Visualized orbits: Bilateral cataract extraction.   Mastoids and visualized paranasal sinuses: Mild mucosal thickening in the maxillary sinuses. Clear mastoid air cells.   Skeleton: No suspicious bone lesion. Spondylosis, including bridging anterior vertebral osteophytes throughout the lower cervical and included upper thoracic spine.   Upper chest: Clear lung apices.   Other: None.   IMPRESSION: 1. 3.6 cm ulcerated right-sided superior oropharyngeal mass compatible with malignancy. 2. No evidence of cervical lymphadenopathy.   Surg path 05/27/23 A. OROPHARYNX, RIGHT, EXCISION:  Invasive moderately differentiated squamous cell carcinoma.  See comment.   COMMENT:  Immunohistochemistry for p16 is pending and will be reported as an  addendum.    Assessment/Plan: Encounter Diagnoses  Name Primary?   Squamous cell carcinoma of oropharynx (HCC) Yes   Oropharyngeal mass    Chewing tobacco dependence    Odynophagia    Otalgia of right ear     Assessment and Plan    Oropharyngeal Mass right side, likely malignant Presents with a concerning right oropharyngeal  mass, likely malignant, as suggested by imaging and exam. Symptoms include severe odynophagia, dysphagia, and occasional hemoptysis, leading to decreased food intake. We biopsied the mass today, and sent for permanent pathology and p16 staining. He is a former smoker and quit chewing tobacco a couple of weeks ago. Emphasized pain management to facilitate nutrition. We discussed that after biopsy results are available, we will proceed with PET/CT and tumor board discussion of his case.   - F/u bx results  - Order PET CT scan once bx results are available - Prescribe analgesics to facilitate eating (Extra strength Tylenol/Motrin Q6hrs and Oxy 5 mg Q6hrs PRN)  General Health Maintenance Quit chewing tobacco two to three weeks ago. No history of heavy alcohol use or recent smoking.  -  we discussed importance of avoiding tobacco chewing.     F/u - RTC 2 weeks  Update 06/04/2023 Assessment and Plan    Squamous Cell Carcinoma of the oropharynx - p16 stain pending  Known head and neck cancer with a mass involving the right tonsils, palate, causing severe pain and odynophagia. Pain likely due to nerve involvement by the tumor. Scheduled for a PET/CT scan on Monday, crucial for determining next steps in treatment. Current pain regiment does not fully treat the pain and he has pain when swallowing. We discussed the importance of PET/CT and will re-evaluate if he is able to eat/drink enough by mouth in a couple of days after trying a new pain regimen below  - Prescribe gabapentin 100 mg 1 capsule TID - Prescribe viscous lidocaine for gargling Q4H before meals - Continue current pain management regimen: alternating acetaminophen and ibuprofen Q6hrs staggered 2 hrs apart from each other, oxycodone 5 mg Q4hrs for severe pain - Encourage intake of easy-to-swallow, high-calorie foods and fluids (e.g., smoothies, blended soups, ice cream, popsicles) - Monitor for side effects of gabapentin and adjust dosage if  necessary - Emphasized the importance of hydration - Reassess after PET scan and follow-up with oncologist Dr. Basilio Cairo on Wednesday to see if he needs a feeding tube - Consider ER visit and potential feeding tube placement if unable to maintain oral intake to avoid dehydration   Odynophagia  Severe pain not adequately controlled with current regimen of oxycodone, acetaminophen, and ibuprofen. Pain described as feeling like swallowing glass, indicating significant discomfort. Discussed alternative pain management options including tramadol and gabapentin. Explained that pain is related to the tumor and will likely persist until cancer treatment begins. - Prescribe gabapentin 100 mg 1 capsule TID - Prescribe viscous lidocaine for gargling Q4H before meals - Continue current pain management regimen: alternating acetaminophen and ibuprofen Q6hrs staggered 2 hrs apart from each other, oxycodone 5 mg Q4hrs for severe pain - Encourage the use of viscous lidocaine before meals to reduce pain during swallowing  Nutritional Support Struggling with oral intake, consuming minimal amounts of food and water. Emphasized the importance of maintaining hydration and nutrition to avoid hospitalization and ensure PET scan proceeds as scheduled. - Encourage intake of high-calorie, easy-to-swallow foods and fluids - Monitor for signs of dehydration and advise ER visit if unable to maintain oral intake - Discuss the potential need for a feeding tube if oral intake does not improve after PET scan  Follow-up - Call next week to check on status  - Reassess treatment plan based on PET/CT scan results and oncologist's recommendations.         Ashok Croon, MD Otolaryngology [facility] Phone: [phone] Fax: [phone]    06/04/2023, 12:38 PM

## 2023-06-04 NOTE — Patient Instructions (Addendum)
Take Tylenol Q6hrs Staggered 3 hrs apart  Take Ibuprofen Q6hrs Staggered 3 hrs apart  Oxy 5 mg  Q4hrs PRN for pain  Gabapentin 100 mg three times daily  Viscous lidocaine - gargle with it prior to eating  - do PET/CT Monday  - see Dr Basilio Cairo Wednesday

## 2023-06-04 NOTE — Progress Notes (Incomplete)
Head and Neck Cancer Location of Tumor / Histology: . Squamous Cell Carcinoma of Oropharynx  Patient presented 2 to three months ago with symptoms of:  Dr. Irene Pap 05/27/2023 "Throat pain and difficulty swallowing which began after a cold. The pain worsens when he attempts to cough up phlegm.The severity of the pain has led him to stop eating and drinking as of yesterday. Previously, he was able to consume most foods, but the pain has intensified. He also has pain in his right ear.  He has a history of coughing up blood, with episodes occurring two days ago and five days ago. He describes waking up with a mouthful of blood which he rinses out. He is experiencing severe pain related to his oropharyngeal cancer, which is severe when swallowing. He describes the sensation as feeling like 'trying to swallow some glass.' The pain has been persistent and is not relieved by his current pain management regimen. He is struggling to maintain adequate nutrition at home. He consumes only small amounts of food and liquid, such as jelly, and potato soup, and is only able to take sips of water throughout the day."  Biopsies revealed:   CT of the Neck with Contrast 05/17/2023   Nutrition Status Yes No Comments  Weight changes? []  []    Swallowing concerns? []  []    PEG? []  []     Referrals Yes No Comments  Social Work? []  []    Dentistry? []  []    Swallowing therapy? []  []    Nutrition? []  []    Med/Onc? []  []     Safety Issues Yes No Comments  Prior radiation? []  []    Pacemaker/ICD? []  []    Possible current pregnancy? []  []    Is the patient on methotrexate? []  []     Tobacco/Marijuana/Snuff/ETOH use:  He quit chewing tobacco two to three weeks ago and has a remote history of smoking from fifty years ago. He denies heavy alcohol use.  Past/Anticipated interventions by otolaryngology, if any:  Dr. Irene Pap 06/04/2023    Past/Anticipated interventions by medical oncology, if any: {:[id number]}    Current  Complaints / other details:  ***

## 2023-06-07 ENCOUNTER — Encounter (HOSPITAL_COMMUNITY)
Admission: RE | Admit: 2023-06-07 | Discharge: 2023-06-07 | Disposition: A | Discharge status: Home / Self Care | Payer: Medicare Other | Source: Ambulatory Visit | Attending: Otolaryngology | Admitting: Otolaryngology

## 2023-06-07 ENCOUNTER — Emergency Department (HOSPITAL_COMMUNITY)
Admission: EM | Admit: 2023-06-07 | Discharge: 2023-06-07 | Discharge status: Against Medical Advice | Payer: Medicare Other | Source: Home / Self Care

## 2023-06-07 DIAGNOSIS — C109 Malignant neoplasm of oropharynx, unspecified: Secondary | ICD-10-CM | POA: Insufficient documentation

## 2023-06-07 DIAGNOSIS — C772 Secondary and unspecified malignant neoplasm of intra-abdominal lymph nodes: Secondary | ICD-10-CM | POA: Diagnosis not present

## 2023-06-07 LAB — GLUCOSE, CAPILLARY: Glucose-Capillary: 98 mg/dL (ref 70–99)

## 2023-06-07 MED ORDER — FLUDEOXYGLUCOSE F - 18 (FDG) INJECTION
15.0000 | Freq: Once | INTRAVENOUS | Status: AC
  Administered 2023-06-07: 14.755 via INTRAVENOUS

## 2023-06-07 NOTE — Progress Notes (Signed)
 Oncology Nurse Navigator Documentation   Placed introductory call to new referral patient Isaac Carpenter. I spoke to his wife, Vernona Rieger.  Introduced myself as the H&N oncology nurse navigator that works with Dr. Basilio Cairo and Dr. Arlana Pouch to whom he has been referred by Dr. Irene Pap.  She confirmed understanding of referral. Briefly explained my role as his navigator, provided my contact information.  Confirmed understanding of upcoming appts and [facility] location, explained arrival and registration process. I encouraged them to call with questions/concerns as he moves forward with appts and procedures.   She verbalized understanding of information provided, expressed appreciation for my call.   Navigator Initial Assessment Employment Status: he is retired Currently on [employer] / STD: no Living Situation: he lives with his wife Support System: wife, family PCP: unknown [id number] Financial Concerns: no Transportation Needs: no Sensory Deficits: no Chiropodist Needed:  no Ambulation Needs: no Psychosocial Needs:  no Concerns/Needs Understanding Cancer:  addressed/answered by navigator to best of ability Self-Expressed Needs: no   Tourist information centre manager, BSN, OCN Head & Neck Oncology Nurse Navigator [facility] Phone # [phone]  Fax # [phone]

## 2023-06-07 NOTE — Progress Notes (Signed)
 Radiation Oncology         [phone]) [phone] ________________________________  Initial outpatient Consultation  Name: Isaac Carpenter MRN: [id number]  Date: 06/08/2023  DOB: 08/09/1950  ON:GEXBMW, Timoteo Expose, MD  Ashok Croon, MD   REFERRING PHYSICIAN: Ashok Croon, MD  DIAGNOSIS: No diagnosis found.   Cancer Staging  Malignant neoplasm of prostate [facility]) Staging form: Prostate, AJCC 8th Edition - Clinical stage from 05/13/2022: Stage IIC (cT1c, cN0, cM0, PSA: 13.2, Grade Group: 3) - Signed by Marcello Fennel, PA-C on 07/17/2022 Histopathologic type: Adenocarcinoma, NOS Stage prefix: Initial diagnosis Prostate specific antigen (PSA) range: 10 to 19 Gleason primary pattern: 4 Gleason secondary pattern: 3 Gleason score: 7 Histologic grading system: 5 grade system Number of biopsy cores examined: 12 Number of biopsy cores positive: 4 Location of positive needle core biopsies: Both sides   CHIEF COMPLAINT: Here to discuss management of right oropharynx cancer  HISTORY OF PRESENT ILLNESS::Isaac Carpenter is a 73 y.o. male who presented to his PCP Dr Betty [name] on 05-17-23 with complains of right-sided jaw/maxillary pain that tends to occur when coughing. Pain had persisted for several months at that time following an upper respiratory infection.   Subsequently, he underwent a CT soft tissue neck on 05-17-23 which revealed an ulcerated enhancing mass involving the superior oropharynx and soft palate/uvula measuring 3.4 x 3.0 x 3.6 cm in the greatest extent that is concerning for malignancy. No evidence of cervical lymphadenopathy was indicated on scan   In light of findings, the patient saw Dr. Ashok Croon on 05-27-23 with worsening symptoms. During his visit, he underwent a flexible fiberoptic laryngoscopy with several biopsies of the friable tissue from the right oropharyngeal mass.    Biopsy of right oropharynx mass on 05-27-23 revealed: Invasive moderately differentiated squamous cell  carcinoma. Immunohistochemistry for p16 shows diffuse strong positivity.   During most recent visit with Dr. Irene Pap on 06-04-23, patient reported constant worsening symptoms of odynophagia and dysphagia. Stating that he's unable to maintain adequate nutrition as food typically consists of few bites of soft food and liquids. Pain does not seem to be managed by pain relievers including oxycodone. To further evaluate the extent of the disease, he underwent a PET scan performed on 06-07-23 revealing *** (result currently not available).   Swallowing issues, if any: Odynophagia and dysphagia  Weight Changes: last weight on 1/27 is 131.1 kg  Pain status: patient reports extreme pain that hinders him from eating or swallowing.  Patient reports taking Tylenol, Ibuprofen, and Oxycodone without much relief.  Other symptoms: pain when coughing, hemoptysis, sputum production    Tobacco history, if any: former smoker, chewing tobacco, reports quitting in early January 2025  ETOH abuse, if any: no history of heavy alcohol use  Prior cancers, if any: prostate cancer diagnosed in March 2024 . S/P radioactive seed implant/brachytherapy  PREVIOUS RADIATION THERAPY: Yes  Insertion of radioactive I-125 seeds into the prostate gland; 145 Gy, definitive therapy with placement of SpaceOAR gel.   PAST MEDICAL HISTORY:  has a past medical history of Arthritis, Cancer (HCC), History of partial thyroidectomy (STATES OVERACTIVE THYROID-- NO ISSUES SINCE AGE 30 AND NO MEDS), Hyperlipidemia, Hypertension, Hypothyroidism, Sleep apnea, and Thyroid disease.    PAST SURGICAL HISTORY: Past Surgical History:  Procedure Laterality Date   CIRCUMCISION  01/04/2012   Procedure: CIRCUMCISION ADULT;  Surgeon: Kathi Ludwig, MD;  Location: [facility];  Service: Urology;  Laterality: N/A;   COLONOSCOPY  04/08/2020   Doristine Locks  CYSTOSCOPY  09/04/2022   Procedure: CYSTOSCOPY;  Surgeon: Marcine Matar, MD;  Location: [facility];  Service: Urology;;   RADIOACTIVE SEED IMPLANT N/A 09/04/2022   Procedure: RADIOACTIVE SEED IMPLANT/BRACHYTHERAPY IMPLANT;  Surgeon: Marcine Matar, MD;  Location: [facility];  Service: Urology;  Laterality: N/A;  90 MINS   SPACE OAR INSTILLATION N/A 09/04/2022   Procedure: SPACE OAR INSTILLATION;  Surgeon: Marcine Matar, MD;  Location: [facility];  Service: Urology;  Laterality: N/A;   THYROIDECTOMY, PARTIAL  AGE 73   OVERACTIVE THYROID    FAMILY HISTORY: family history includes COPD in his father; Cancer in his brother, brother, mother, sister, and sister; Heart attack in his father.  SOCIAL HISTORY:  reports that he has never smoked. His smokeless tobacco use includes chew. He reports that he does not drink alcohol and does not use drugs.  ALLERGIES: Testosterone  MEDICATIONS:  Current Outpatient Medications  Medication Sig Dispense Refill   acetaminophen (TYLENOL) 500 MG tablet Take 1 tablet (500 mg total) by mouth every 6 (six) hours. Please take every 6 hrs and stagger with Motrin 3 hrs apart from each other. Please do not exceed maximum daily dose to avoid liver damage 30 tablet 1   aspirin EC 81 MG tablet Take 81 mg by mouth daily. Swallow whole.     atorvastatin (LIPITOR) 20 MG tablet Take 1 tablet (20 mg total) by mouth daily. 90 tablet 3   gabapentin (NEURONTIN) 100 MG capsule Take 1 capsule (100 mg total) by mouth 3 (three) times daily. 90 capsule 3   ibuprofen (ADVIL) 600 MG tablet Take 1 tablet (600 mg total) by mouth every 6 (six) hours. Please take every 6 hrs, and stagger this medication 3 hrs apart from Tylenol 30 tablet 0   levothyroxine (SYNTHROID) 88 MCG tablet TAKE ONE TABLET BY MOUTH DAILY. 90 tablet 2   lidocaine (XYLOCAINE) 2 % solution Use as directed 15 mLs in the mouth or throat every 4 (four) hours as needed for mouth pain. 100 mL 1   lisinopril-hydrochlorothiazide  (ZESTORETIC) 20-25 MG tablet Take 1 tablet by mouth daily. 90 tablet 2   magic mouthwash SOLN TAKE BY MOUTH THREE TIMES DAILY AS NEEDED FOR UP TO 10 DAYS FOR MOUTH PAIN     oxyCODONE (ROXICODONE) 5 MG immediate release tablet Take 1 tablet (5 mg total) by mouth every 4 (four) hours as needed for severe pain (pain score 7-10). Use 1 tab Q4hrs for pain and if continue to have severe pain ok to take 2 tabs Q4hrs 30 tablet 0   solifenacin (VESICARE) 5 MG tablet Take 5 mg by mouth daily.     tamsulosin (FLOMAX) 0.4 MG CAPS capsule Take 1 capsule (0.4 mg total) by mouth 2 (two) times daily after a meal. 60 capsule 2   No current facility-administered medications for this encounter.    REVIEW OF SYSTEMS:  Notable for that above.   PHYSICAL EXAM:  vitals were not taken for this visit.   General: Alert and oriented, in no acute distress HEENT: Head is normocephalic. Extraocular movements are intact. Oropharynx is notable for ***. Neck: Neck is notable for *** Heart: Regular in rate and rhythm with no murmurs, rubs, or gallops. Chest: Clear to auscultation bilaterally, with no rhonchi, wheezes, or rales. Abdomen: Soft, nontender, nondistended, with no rigidity or guarding. Extremities: No cyanosis or edema. Lymphatics: see Neck Exam Skin: No concerning lesions. Musculoskeletal: symmetric strength and muscle tone throughout. Neurologic:  Cranial nerves II through XII are grossly intact. No obvious focalities. Speech is fluent. Coordination is intact. Psychiatric: Judgment and insight are intact. Affect is appropriate.   ECOG = ***  0 - Asymptomatic (Fully active, able to carry on all predisease activities without restriction)  1 - Symptomatic but completely ambulatory (Restricted in physically strenuous activity but ambulatory and able to carry out work of a light or sedentary nature. For example, light housework, office work)  2 - Symptomatic, <50% in bed during the day (Ambulatory and  capable of all self care but unable to carry out any work activities. Up and about more than 50% of waking hours)  3 - Symptomatic, >50% in bed, but not bedbound (Capable of only limited self-care, confined to bed or chair 50% or more of waking hours)  4 - Bedbound (Completely disabled. Cannot carry on any self-care. Totally confined to bed or chair)  5 - Death   Santiago Glad MM, Creech RH, Tormey DC, et al. [phone]). "Toxicity and response criteria of the [facility]". Am. Evlyn Clines. Oncol. 5 (6): 649-55   LABORATORY DATA:  Lab Results  Component Value Date   WBC 5.4 05/17/2023   HGB 14.8 05/17/2023   HCT 44.7 05/17/2023   MCV 93.7 05/17/2023   PLT 220.0 05/17/2023   CMP     Component Value Date/Time   NA 140 05/17/2023 0930   K 4.0 05/17/2023 0930   CL 100 05/17/2023 0930   CO2 32 05/17/2023 0930   GLUCOSE 126 (H) 05/17/2023 0930   BUN 28 (H) 05/17/2023 0930   CREATININE 0.83 05/17/2023 0930   CREATININE 0.72 01/31/2020 1129   CALCIUM 9.2 05/17/2023 0930   PROT 6.9 09/24/2022 1620   ALBUMIN 3.6 09/24/2022 1620   AST 26 09/24/2022 1620   ALT 22 09/24/2022 1620   ALKPHOS 65 09/24/2022 1620   BILITOT 0.8 09/24/2022 1620   GFRNONAA >60 09/24/2022 1620   GFRNONAA 96 01/31/2020 1129   GFRAA 111 01/31/2020 1129      Lab Results  Component Value Date   TSH 3.19 05/09/2021     RADIOGRAPHY: CT Soft Tissue Neck W Contrast Result Date: 05/17/2023 CLINICAL DATA:  Soft tissue infection suspected, neck, no prior imaging. 2 months of right maxillary pain, on exam edema of soft palate with erythema and ulcer. EXAM: CT NECK WITH CONTRAST TECHNIQUE: Multidetector CT imaging of the neck was performed using the standard protocol following the bolus administration of intravenous contrast. RADIATION DOSE REDUCTION: This exam was performed according to the departmental dose-optimization program which includes automated exposure control, adjustment of the mA and/or kV according  to patient size and/or use of iterative reconstruction technique. CONTRAST:  75mL OMNIPAQUE IOHEXOL 300 MG/ML  SOLN COMPARISON:  None Available. FINDINGS: Pharynx and larynx: An ulcerated enhancing mass involving the superior oropharynx and soft palate/uvula measures 3.4 x 3.0 x 3.6 cm. The mass is centered on the right but does mildly cross midline. Salivary glands: No inflammation, mass, or stone. Thyroid: Diffusely lobular contour without a dominant lesion apparent by CT. Lymph nodes: No enlarged or suspicious lymph nodes in the neck. Vascular: Major vascular structures of the neck are patent. Limited intracranial: Unremarkable. Visualized orbits: Bilateral cataract extraction. Mastoids and visualized paranasal sinuses: Mild mucosal thickening in the maxillary sinuses. Clear mastoid air cells. Skeleton: No suspicious bone lesion. Spondylosis, including bridging anterior vertebral osteophytes throughout the lower cervical and included upper thoracic spine. Upper chest: Clear lung apices. Other: None. IMPRESSION: 1. 3.6  cm ulcerated right-sided superior oropharyngeal mass compatible with malignancy. 2. No evidence of cervical lymphadenopathy. Electronically Signed   By: Sebastian Ache M.D.   On: 05/17/2023 15:43      IMPRESSION/PLAN:  This is a delightful patient with head and neck cancer. I *** recommend radiotherapy for this patient.  We discussed the potential risks, benefits, and side effects of radiotherapy. We talked in detail about acute and late effects. We discussed that some of the most bothersome acute effects may be mucositis, dysgeusia, salivary changes, skin irritation, hair loss, dehydration, weight loss and fatigue. We talked about late effects which include but are not necessarily limited to dysphagia, hypothyroidism, nerve injury, vascular injury, spinal cord injury, xerostomia, trismus, neck edema, dental issues, non-healing wound, and potentially fatal injury to any of the tissues in the  head and neck region. No guarantees of treatment were given. A consent form was signed and placed in the patient's medical record. The patient is enthusiastic about proceeding with treatment. I look forward to participating in the patient's care.    Simulation (treatment planning) will take place ***  We also discussed that the treatment of head and neck cancer is a multidisciplinary process to maximize treatment outcomes and quality of life. For this reason the following referrals have been or will be made:  *** Medical oncology to discuss chemotherapy   *** Dentistry for dental evaluation, possible extractions in the radiation fields, and /or advice on reducing risk of cavities, osteoradionecrosis, or other oral issues.  *** Nutritionist for nutrition support during and after treatment.  *** Speech language pathology for swallowing and/or speech therapy.  *** Social work for social support.   *** Physical therapy due to risk of lymphedema in neck and deconditioning.  *** Baseline labs including TSH.  On date of service, in total, I spent *** minutes on this encounter. Patient was seen in person.  __________________________________________   Lonie Peak, MD   This document serves as a record of services personally performed by Lonie Peak, MD. It was created on her behalf by Herbie Saxon, a trained medical scribe. The creation of this record is based on the scribe's personal observations and the provider's statements to them. This document has been checked and approved by the attending provider.

## 2023-06-08 ENCOUNTER — Encounter: Payer: Self-pay | Admitting: Radiation Oncology

## 2023-06-08 ENCOUNTER — Other Ambulatory Visit: Payer: Self-pay

## 2023-06-08 ENCOUNTER — Ambulatory Visit
Admission: RE | Admit: 2023-06-08 | Discharge: 2023-06-08 | Disposition: A | Discharge status: Home / Self Care | Payer: Medicare Other | Source: Ambulatory Visit | Attending: Radiation Oncology | Admitting: Radiation Oncology

## 2023-06-08 ENCOUNTER — Inpatient Hospital Stay: Payer: Medicare Other | Attending: Radiation Oncology

## 2023-06-08 ENCOUNTER — Telehealth (INDEPENDENT_AMBULATORY_CARE_PROVIDER_SITE_OTHER): Payer: Self-pay

## 2023-06-08 VITALS — BP 166/66 | HR 51 | Temp 97.8°F | Resp 20 | Ht 73.0 in | Wt 276.4 lb

## 2023-06-08 DIAGNOSIS — C61 Malignant neoplasm of prostate: Secondary | ICD-10-CM | POA: Insufficient documentation

## 2023-06-08 DIAGNOSIS — Z87891 Personal history of nicotine dependence: Secondary | ICD-10-CM | POA: Insufficient documentation

## 2023-06-08 DIAGNOSIS — G893 Neoplasm related pain (acute) (chronic): Secondary | ICD-10-CM | POA: Insufficient documentation

## 2023-06-08 DIAGNOSIS — I2510 Atherosclerotic heart disease of native coronary artery without angina pectoris: Secondary | ICD-10-CM | POA: Insufficient documentation

## 2023-06-08 DIAGNOSIS — G4730 Sleep apnea, unspecified: Secondary | ICD-10-CM | POA: Insufficient documentation

## 2023-06-08 DIAGNOSIS — Z7982 Long term (current) use of aspirin: Secondary | ICD-10-CM | POA: Insufficient documentation

## 2023-06-08 DIAGNOSIS — R1310 Dysphagia, unspecified: Secondary | ICD-10-CM | POA: Insufficient documentation

## 2023-06-08 DIAGNOSIS — C090 Malignant neoplasm of tonsillar fossa: Secondary | ICD-10-CM | POA: Diagnosis not present

## 2023-06-08 DIAGNOSIS — I700 Atherosclerosis of aorta: Secondary | ICD-10-CM | POA: Diagnosis not present

## 2023-06-08 DIAGNOSIS — I6523 Occlusion and stenosis of bilateral carotid arteries: Secondary | ICD-10-CM | POA: Insufficient documentation

## 2023-06-08 DIAGNOSIS — Z79899 Other long term (current) drug therapy: Secondary | ICD-10-CM | POA: Insufficient documentation

## 2023-06-08 DIAGNOSIS — Z79890 Hormone replacement therapy (postmenopausal): Secondary | ICD-10-CM | POA: Insufficient documentation

## 2023-06-08 DIAGNOSIS — Z8546 Personal history of malignant neoplasm of prostate: Secondary | ICD-10-CM | POA: Insufficient documentation

## 2023-06-08 DIAGNOSIS — Z79891 Long term (current) use of opiate analgesic: Secondary | ICD-10-CM | POA: Insufficient documentation

## 2023-06-08 DIAGNOSIS — E785 Hyperlipidemia, unspecified: Secondary | ICD-10-CM | POA: Insufficient documentation

## 2023-06-08 DIAGNOSIS — Z923 Personal history of irradiation: Secondary | ICD-10-CM | POA: Insufficient documentation

## 2023-06-08 DIAGNOSIS — Z800 Family history of malignant neoplasm of digestive organs: Secondary | ICD-10-CM | POA: Insufficient documentation

## 2023-06-08 DIAGNOSIS — E039 Hypothyroidism, unspecified: Secondary | ICD-10-CM | POA: Insufficient documentation

## 2023-06-08 LAB — BASIC METABOLIC PANEL - CANCER CENTER ONLY
Anion gap: 8 (ref 5–15)
BUN: 32 mg/dL — ABNORMAL HIGH (ref 8–23)
CO2: 33 mmol/L — ABNORMAL HIGH (ref 22–32)
Calcium: 9.3 mg/dL (ref 8.9–10.3)
Chloride: 102 mmol/L (ref 98–111)
Creatinine: 0.76 mg/dL (ref 0.61–1.24)
GFR, Estimated: >60 mL/min (ref >60)
Glucose, Bld: 115 mg/dL — ABNORMAL HIGH (ref 70–99)
Potassium: 3.5 mmol/L (ref 3.5–5.1)
Sodium: 143 mmol/L (ref 135–145)

## 2023-06-08 MED ORDER — SODIUM CHLORIDE 0.9 % IV SOLN: INTRAVENOUS | Status: DC

## 2023-06-08 NOTE — Progress Notes (Signed)
 Dental Form with Estimates of Radiation Dose      Diagnosis:    ICD-10-CM   1. Malignant neoplasm of tonsillar fossa (HCC)  C09.0 0.9 %  sodium chloride infusion    0.9 %  sodium chloride infusion    Basic Metabolic Panel - Cancer Center Only      Cancer Staging  Malignant neoplasm of prostate [facility]) Staging form: Prostate, AJCC 8th Edition - Clinical stage from 05/13/2022: Stage IIC (cT1c, cN0, cM0, PSA: 13.2, Grade Group: 3) - Signed by Marcello Fennel, PA-C on 07/17/2022 Histopathologic type: Adenocarcinoma, NOS Stage prefix: Initial diagnosis Prostate specific antigen (PSA) range: 10 to 19 Gleason primary pattern: 4 Gleason secondary pattern: 3 Gleason score: 7 Histologic grading system: 5 grade system Number of biopsy cores examined: 12 Number of biopsy cores positive: 4 Location of positive needle core biopsies: Both sides  Malignant neoplasm of tonsillar fossa (HCC) Staging form: Pharynx - HPV-Mediated Oropharynx, AJCC 8th Edition - Clinical stage from 06/08/2023: Stage II (cT3, cN2, cM0, p16+) - Signed by Lonie Peak, MD on 06/08/2023 Stage prefix: Initial diagnosis   Prognosis: curative  Anticipated # of fractions: 35    Daily?: yes  # of weeks of radiotherapy: 7  Chemotherapy?: TBD, likely  Anticipated xerostomia:  Mild permanent   Pre-simulation needs:   extractions if needed // Scatter protection if remaining teeth have metal - anticipate 50Gy getting close to or grazing posterior molars in R mandible.  Simulation: ASAP    Other Notes:   Please contact Lonie Peak, MD, with patient's disposition after evaluation and/or dental treatment.

## 2023-06-08 NOTE — Telephone Encounter (Signed)
 Called to check on the patient, his wife stated they had went to the ED yesterday, but left due to it was going to be a very long wait, she stated that he is still having pain, can't swallow much, he did manage to get some pudding and jello down, he is going to see Dr Basilio Cairo today and I told her that hopefully they can give him something different for the pain.  She asked about the feeding tube, I told her they could discuss that with Dr Basilio Cairo

## 2023-06-08 NOTE — Progress Notes (Signed)
 Head and Neck Cancer Location of Tumor / Histology:  Squamous Cell Carcinoma of Oropharynx  Dr. Irene Pap 05/27/2023 "Throat pain and difficulty swallowing which began after a cold. The pain worsens when he attempts to cough up phlegm.The severity of the pain has led him to stop eating and drinking as of yesterday. Previously, he was able to consume most foods, but the pain has intensified. He also has pain in his right ear.  He has a history of coughing up blood, with episodes occurring two days ago and five days ago. He describes waking up with a mouthful of blood which he rinses out. He is experiencing severe pain related to his oropharyngeal cancer, which is severe when swallowing. He describes the sensation as feeling like 'trying to swallow some glass.' The pain has been persistent and is not relieved by his current pain management regimen. He is struggling to maintain adequate nutrition at home. He consumes only small amounts of food and liquid, such as jelly, and potato soup, and is only able to take sips of water throughout the day."  Biopsies revealed:   CT of the Neck with Contrast 05/17/2023    Nutrition Status Yes No Comments  Weight changes? [x]  []  Patient lost 20 pounds since October  Swallowing concerns? [x]  []  Patient says it is very painful to swallow due to pain. Patient is able to eat eggs and pudding.Has difficulty swallowing fluids.  PEG? []  [x]     Referrals Yes No Comments  Social Work? []  [x]    Dentistry? []  [x]    Swallowing therapy? [x]  []    Nutrition? []  []    Med/Onc? []  []     Safety Issues Yes No Comments  Prior radiation? [x]  []  Prostate cancer seeds done in May  Pacemaker/ICD? []  [x]    Possible current pregnancy? []  [x]    Is the patient on methotrexate? []  [x]     Tobacco/Marijuana/Snuff/ETOH use:  He quit chewing tobacco two to three weeks ago and has a remote history of smoking from fifty years ago. He denies heavy alcohol use.  Past/Anticipated  interventions by otolaryngology, if any:  Dr. Irene Pap 06/04/2023     Past/Anticipated interventions by medical oncology, if any:  Dr. Basilio Cairo 06/08/2023 Radiation Therapy    Current Complaints / other details:   None Wt Readings from Last 3 Encounters:  06/08/23 276 lb 6.4 oz (125.4 kg)  05/17/23 289 lb (131.1 kg)  10/02/22 293 lb (132.9 kg)

## 2023-06-09 ENCOUNTER — Other Ambulatory Visit: Payer: Self-pay

## 2023-06-09 ENCOUNTER — Other Ambulatory Visit (HOSPITAL_COMMUNITY): Payer: Self-pay | Admitting: Radiation Oncology

## 2023-06-09 ENCOUNTER — Ambulatory Visit: Payer: Medicare Other | Admitting: Radiation Oncology

## 2023-06-09 ENCOUNTER — Ambulatory Visit: Payer: Medicare Other

## 2023-06-09 ENCOUNTER — Telehealth (HOSPITAL_COMMUNITY): Payer: Self-pay

## 2023-06-09 DIAGNOSIS — C090 Malignant neoplasm of tonsillar fossa: Secondary | ICD-10-CM

## 2023-06-09 DIAGNOSIS — R1310 Dysphagia, unspecified: Secondary | ICD-10-CM

## 2023-06-09 DIAGNOSIS — R059 Cough, unspecified: Secondary | ICD-10-CM

## 2023-06-09 NOTE — Progress Notes (Signed)
 Oncology Nurse Navigator Documentation   Met with patient during initial consult with Dr. Basilio Cairo.  He was accompanied by his wife.  Further introduced myself as his/their Navigator, explained my role as a member of the Care Team. Provided New Patient resource guide binder: Contact information for physicians, this navigator, other members of the Care Team Advance Directive information; provided [facility] AD booklet at their request,  Fall Prevention Patient Safety Plan Financial Assistance Information sheet Symptom Management Clinic information [facility] map with highlight of [facility] SLP Information sheet Head and Neck cancer basics Nutrition information Patient and family support information including Spiritual care/Chaplain information, Peer mentor program, health and wellness classes, and the survivorship program Community resources  Provided and discussed educational handouts for PEG and PAC. Assisted with post-consult appt scheduling. I sent a referral to [facility] for dental clearance and scatter protective device fabrication.  They verbalized understanding of information provided. I encouraged them to call with questions/concerns moving forward.  Hedda Slade, RN, BSN, OCN Head & Neck Oncology Nurse Navigator [facility] at [place] [phone]

## 2023-06-09 NOTE — Telephone Encounter (Signed)
-----   Message from Kandis Cocking [facility] sent at 06/09/2023  2:14 PM EST ----- Regarding: RE: Peg/Port Placement Approved for both.   HKM ----- Message ----- From: Sharee Pimple Sent: 06/09/2023   9:50 AM EST To: Ir Procedure Requests Subject: Peg/Port Placement                             Procedure: Peg and Port placement  Dx: malignant neoplasm of tonsillar fossa, head and neck cancer, unable to swallow  Ordering: Dr. Archie Patten Pasam [phone]  Imaging: NM Pet done 06/07/23  Please review.   Thanks,  Fara Boros

## 2023-06-10 ENCOUNTER — Inpatient Hospital Stay: Payer: Medicare Other

## 2023-06-10 ENCOUNTER — Ambulatory Visit (INDEPENDENT_AMBULATORY_CARE_PROVIDER_SITE_OTHER): Payer: Medicare Other | Admitting: Otolaryngology

## 2023-06-10 ENCOUNTER — Other Ambulatory Visit: Payer: Self-pay | Admitting: *Deleted

## 2023-06-10 ENCOUNTER — Inpatient Hospital Stay: Payer: Medicare Other | Admitting: Oncology

## 2023-06-10 DIAGNOSIS — Z7189 Other specified counseling: Secondary | ICD-10-CM | POA: Diagnosis not present

## 2023-06-10 DIAGNOSIS — Z923 Personal history of irradiation: Secondary | ICD-10-CM | POA: Insufficient documentation

## 2023-06-10 DIAGNOSIS — C090 Malignant neoplasm of tonsillar fossa: Secondary | ICD-10-CM

## 2023-06-10 DIAGNOSIS — G893 Neoplasm related pain (acute) (chronic): Secondary | ICD-10-CM

## 2023-06-10 DIAGNOSIS — Z87891 Personal history of nicotine dependence: Secondary | ICD-10-CM | POA: Insufficient documentation

## 2023-06-10 DIAGNOSIS — R1310 Dysphagia, unspecified: Secondary | ICD-10-CM | POA: Insufficient documentation

## 2023-06-10 DIAGNOSIS — C61 Malignant neoplasm of prostate: Secondary | ICD-10-CM | POA: Insufficient documentation

## 2023-06-10 DIAGNOSIS — Z79891 Long term (current) use of opiate analgesic: Secondary | ICD-10-CM | POA: Diagnosis not present

## 2023-06-10 DIAGNOSIS — Z8546 Personal history of malignant neoplasm of prostate: Secondary | ICD-10-CM | POA: Insufficient documentation

## 2023-06-10 MED ORDER — SODIUM CHLORIDE 0.9 % IV SOLN: INTRAVENOUS | Status: AC

## 2023-06-10 MED ORDER — OXYCODONE HCL 10 MG PO TABS
10.0000 mg | ORAL_TABLET | Freq: Four times a day (QID) | ORAL | 0 refills | Status: DC | PRN
Start: 2023-06-10 — End: 2023-08-14

## 2023-06-10 NOTE — Progress Notes (Signed)
 Oncology Nurse Navigator Documentation   Met with patient during initial consult with Dr. Arlana Pouch. He was accompanied by his wife.  Further introduced myself as his/their Navigator, explained my role as a member of the Care Team. Assisted with post-consult appt scheduling. I will meet them both on Monday during his IVF's to provide PEG education.  They verbalized understanding of information provided. I encouraged them to call with questions/concerns moving forward.  Hedda Slade, RN, BSN, OCN Head & Neck Oncology Nurse Navigator [facility] at [place] [phone]

## 2023-06-10 NOTE — Progress Notes (Addendum)
 [facility]  ONCOLOGY CONSULT NOTE   PATIENT NAME: Isaac Carpenter   MR#: [id number] DOB: Apr 26, 1950  DATE OF SERVICE: 06/10/2023   REFERRING PHYSICIAN  Ashok Croon, MD, otolaryngology  Patient Care Team: [name], [name], MD as PCP - General (Family Medicine) Cherlyn Cushing, RN as Oncology Nurse Navigator Ernesto Rutherford, MD as Referring Physician (Ophthalmology) Marcine Matar, MD as Consulting Physician (Urology) Margaretmary Dys, MD as Consulting Physician (Radiation Oncology) Maryclare Labrador, RN as Registered Nurse Causey, Larna Daughters, NP as Nurse Practitioner (Hematology and Oncology) Lonie Peak, MD as Attending Physician (Radiation Oncology) Ashok Croon, MD as Consulting Physician (Otolaryngology)    CHIEF COMPLAINT/ PURPOSE OF CONSULTATION:   Newly diagnosed squamous cell carcinoma of the right tonsil.  ASSESSMENT & PLAN:   Isaac Carpenter is a 72 y.o. male with a past medical history of prostate cancer diagnosed in March 2024 . S/P radioactive seed implant/brachytherapy, was referred to our clinic for newly diagnosed squamous cell carcinoma of the right tonsil, clinical stage II disease.  Malignant neoplasm of tonsillar fossa (HCC) Please review oncology history for additional details and timeline of events.  Symptoms include severe pain and dysphagia since mid-November, progressively worsening.   cT3,cN2,cM0,p16+ tumor, Stage II disease.   His case was discussed in tumor conference on 06/09/2023.  Given the extent of disease, consensus opinion is to proceed with concurrent chemoradiation.  He already had consultation with Dr. Basilio Cairo.   We have discussed about role of cisplatin being a radiosensitizer in the treatment of head and neck cancer.  We have discussed about the curative intent of chemoradiation for this patient.     We have discussed about mechanism of action of cisplatin, adverse effects of cisplatin including but not limited  to fatigue, nausea, vomiting, increased risk of infections, mucositis, ototoxicity, nephrotoxicity, peripheral neuropathy.  Patient understands that some of the side effects can be permanent and even potentially fatal.  We have discussed about role of Mediport and G-tube for chemotherapy administration and nutrition respectively since most of these patients have severe mucositis during the treatment.  At this time we do not know if weekly cisplatin is inferior to every 21 days cisplatin since there is no head-to-head comparison trial.  I did mention to the patient however weekly cisplatin is well-tolerated with less adverse effects and most of the patients tend to complete treatment as planned.  Patient is willing to proceed with weekly cisplatin.  - Coordinate with Dr. Basilio Cairo for radiation therapy - Provide formal chemotherapy education session - Monitor blood counts and renal function weekly - Administer IV fluids pre- and post-chemotherapy - Ensure home hydration (80-90 oz non-caffeinated fluids daily) - Monitor and replace electrolytes as needed - Schedule dental evaluation and plan treatment start accordingly - Order blood work including thyroid function and CBC  Referral sent for Port-A-Cath placement and feeding tube placement.  Cancer associated pain Severe pain associated with oropharyngeal cancer, exacerbated by eating and drinking. Current pain management with oxycodone and lidocaine is insufficient. - Increase oxycodone to 10 mg QID - Use lidocaine before meals  Coordination of complex care - Schedule formal chemotherapy education session - Administer IV fluids on Monday, Wednesday, and Friday next week and then as needed thereafter    I reviewed lab results and outside records for this visit and discussed relevant results with the patient. Diagnosis, plan of care and treatment options were also discussed in detail with the patient. Opportunity provided to ask questions and  answers provided to his apparent satisfaction. Provided instructions to call our clinic with any problems, questions or concerns prior to return visit. I recommended to continue follow-up with PCP and sub-specialists. He verbalized understanding and agreed with the plan. No barriers to learning was detected.  NCCN guidelines have been consulted in the planning of this patient's care.  Meryl Crutch, MD  [facility] - A DEPT OF [facility] [address] [place] [address] Dept: [phone] Dept Fax: [phone]   HISTORY OF PRESENTING ILLNESS:   Discussed the use of AI scribe software for clinical note transcription with the patient, who gave verbal consent to proceed.   I have reviewed his chart and materials related to his cancer extensively and collaborated history with the patient. Summary of oncologic history is as follows:  ONCOLOGY HISTORY:  He presented to his PCP Dr Betty [name] on 05-17-23 with complains of right-sided jaw/maxillary pain that tends to occur when coughing. Pain had persisted for several months at that time following an upper respiratory infection.    Subsequently, he underwent a CT soft tissue neck on 05-17-23 which revealed an ulcerated enhancing mass involving the superior oropharynx and soft palate/uvula measuring 3.4 x 3.0 x 3.6 cm in the greatest extent that is concerning for malignancy. No evidence of cervical lymphadenopathy was indicated on scan    In light of findings, the patient saw Dr. Ashok Croon on 05-27-23 with worsening symptoms. During his visit, he underwent a flexible fiberoptic laryngoscopy with several biopsies of the friable tissue from the right oropharyngeal mass.     Biopsy of right oropharynx mass on 05-27-23 revealed: Invasive moderately differentiated squamous cell carcinoma. Immunohistochemistry for p16 shows diffuse strong positivity.    During most recent visit with  Dr. Irene Pap on 06-04-23, patient reported constant worsening symptoms of odynophagia and dysphagia. Stating that he's unable to maintain adequate nutrition as food typically consists of few bites of soft food and liquids. Pain does not seem to be managed by pain relievers including oxycodone. To further evaluate the extent of the disease, he underwent a PET scan performed on 06-07-23 revealing a right  oropharyngeal tumor >4cm, likely b/l level II adenopathy, no distant metastases.   cT3,cN2,cM0,p16+ tumor, Stage II disease.   Plan for concurrent chemoradiation with weekly cisplatin.  Oncology History  Malignant neoplasm of prostate (HCC)  05/13/2022 Cancer Staging   Staging form: Prostate, AJCC 8th Edition - Clinical stage from 05/13/2022: Stage IIC (cT1c, cN0, cM0, PSA: 13.2, Grade Group: 3) - Signed by Marcello Fennel, PA-C on 07/17/2022 Histopathologic type: Adenocarcinoma, NOS Stage prefix: Initial diagnosis Prostate specific antigen (PSA) range: 10 to 19 Gleason primary pattern: 4 Gleason secondary pattern: 3 Gleason score: 7 Histologic grading system: 5 grade system Number of biopsy cores examined: 12 Number of biopsy cores positive: 4 Location of positive needle core biopsies: Both sides   07/17/2022 Initial Diagnosis   Malignant neoplasm of prostate (HCC)   Malignant neoplasm of tonsillar fossa (HCC)  06/08/2023 Initial Diagnosis   Malignant neoplasm of tonsillar fossa (HCC)   06/08/2023 Cancer Staging   Staging form: Pharynx - HPV-Mediated Oropharynx, AJCC 8th Edition - Clinical stage from 06/08/2023: Stage II (cT3, cN2, cM0, p16+) - Signed by Lonie Peak, MD on 06/08/2023 Stage prefix: Initial diagnosis     INTERVAL HISTORY:  She reports difficulty swallowing and throat pain. The symptoms began in mid-November and have progressively worsened. The patient sought care  from a dentist and an urgent care center before being referred to his primary care physician. A biopsy was  performed on the 6th of this month, which has exacerbated the patient's symptoms. The patient reports that the pain is particularly severe when attempting to eat or drink. Current pain management with oxycodone and lidocaine has not provided relief. The patient also has a history of hearing loss in one ear, which occurred approximately three to four years ago and was attributed to a virus.  Former smoker, chewing tobacco, reports quitting in early January 2025  MEDICAL HISTORY:  Past Medical History:  Diagnosis Date   Arthritis    Cancer (HCC)    History of partial thyroidectomy STATES OVERACTIVE THYROID-- NO ISSUES SINCE AGE 42 AND NO MEDS   Hyperlipidemia    Hypertension    Hypothyroidism    Sleep apnea    Per patient he tried CPAP but could not tolerate.   Thyroid disease     SURGICAL HISTORY: Past Surgical History:  Procedure Laterality Date   CIRCUMCISION  01/04/2012   Procedure: CIRCUMCISION ADULT;  Surgeon: Kathi Ludwig, MD;  Location: [facility];  Service: Urology;  Laterality: N/A;   COLONOSCOPY  04/08/2020   Vito Cirigliano   CYSTOSCOPY  09/04/2022   Procedure: CYSTOSCOPY;  Surgeon: Marcine Matar, MD;  Location: [facility];  Service: Urology;;   RADIOACTIVE SEED IMPLANT N/A 09/04/2022   Procedure: RADIOACTIVE SEED IMPLANT/BRACHYTHERAPY IMPLANT;  Surgeon: Marcine Matar, MD;  Location: [facility];  Service: Urology;  Laterality: N/A;  90 MINS   SPACE OAR INSTILLATION N/A 09/04/2022   Procedure: SPACE OAR INSTILLATION;  Surgeon: Marcine Matar, MD;  Location: [facility];  Service: Urology;  Laterality: N/A;   THYROIDECTOMY, PARTIAL  AGE 42   OVERACTIVE THYROID    SOCIAL HISTORY: Social History   Socioeconomic History   Marital status: Married    Spouse name: Not on file   Number of children: Not on file   Years of education: Not on file   Highest education level: Not on file   Occupational History   Not on file  Tobacco Use   Smoking status: Never   Smokeless tobacco: Current    Types: Chew   Tobacco comments:    started chewing tobacco off and on since 1970  Vaping Use   Vaping status: Never Used  Substance and Sexual Activity   Alcohol use: Never   Drug use: No   Sexual activity: Not Currently  Other Topics Concern   Not on file  Social History Narrative   Not on file   Social Drivers of Health   Financial Resource Strain: Low Risk  (10/08/2021)   Overall Financial Resource Strain (CARDIA)    Difficulty of Paying Living Expenses: Not hard at all  Food Insecurity: No Food Insecurity (07/17/2022)   Hunger Vital Sign    Worried About Running Out of Food in the Last Year: Never true    Ran Out of Food in the Last Year: Never true  Transportation Needs: No Transportation Needs (07/17/2022)   PRAPARE - Administrator, Civil Service (Medical): No    Lack of Transportation (Non-Medical): No  Physical Activity: Inactive (10/08/2021)   Exercise Vital Sign    Days of Exercise per Week: 0 days    Minutes of Exercise per Session: 0 min  Stress: No Stress Concern Present (10/08/2021)   [employer] of Occupational Health - Occupational Stress Questionnaire  Feeling of Stress : Not at all  Social Connections: Socially Isolated (10/08/2021)   Social Connection and Isolation Panel [NHANES]    Frequency of Communication with Friends and Family: More than three times a week    Frequency of Social Gatherings with Friends and Family: More than three times a week    Attends Religious Services: Never    Database administrator or Organizations: No    Attends Banker Meetings: Never    Marital Status: Divorced  Catering manager Violence: Not At Risk (07/17/2022)   Humiliation, Afraid, Rape, and Kick questionnaire    Fear of Current or Ex-Partner: No    Emotionally Abused: No    Physically Abused: No    Sexually Abused: No    FAMILY  HISTORY: Family History  Problem Relation Age of Onset   Cancer Mother    COPD Father    Heart attack Father    Cancer Sister    Cancer Brother    Cancer Sister    Cancer Brother    Colon cancer Neg Hx    Colon polyps Neg Hx    Esophageal cancer Neg Hx    Rectal cancer Neg Hx    Stomach cancer Neg Hx     ALLERGIES:  He is allergic to testosterone.  MEDICATIONS:  Current Outpatient Medications  Medication Sig Dispense Refill   acetaminophen (TYLENOL) 500 MG tablet Take 1 tablet (500 mg total) by mouth every 6 (six) hours. Please take every 6 hrs and stagger with Motrin 3 hrs apart from each other. Please do not exceed maximum daily dose to avoid liver damage 30 tablet 1   aspirin EC 81 MG tablet Take 81 mg by mouth daily. Swallow whole.     atorvastatin (LIPITOR) 20 MG tablet Take 1 tablet (20 mg total) by mouth daily. 90 tablet 3   ibuprofen (ADVIL) 600 MG tablet Take 1 tablet (600 mg total) by mouth every 6 (six) hours. Please take every 6 hrs, and stagger this medication 3 hrs apart from Tylenol 30 tablet 0   levothyroxine (SYNTHROID) 88 MCG tablet TAKE ONE TABLET BY MOUTH DAILY. 90 tablet 2   lidocaine (XYLOCAINE) 2 % solution Use as directed 15 mLs in the mouth or throat every 4 (four) hours as needed for mouth pain. 100 mL 1   lisinopril-hydrochlorothiazide (ZESTORETIC) 20-25 MG tablet Take 1 tablet by mouth daily. 90 tablet 2   solifenacin (VESICARE) 5 MG tablet Take 5 mg by mouth daily.     tamsulosin (FLOMAX) 0.4 MG CAPS capsule Take 1 capsule (0.4 mg total) by mouth 2 (two) times daily after a meal. 60 capsule 2   gabapentin (NEURONTIN) 100 MG capsule Take 1 capsule (100 mg total) by mouth 3 (three) times daily. (Patient not taking: Reported on 06/10/2023) 90 capsule 3   magic mouthwash SOLN TAKE BY MOUTH THREE TIMES DAILY AS NEEDED FOR UP TO 10 DAYS FOR MOUTH PAIN (Patient not taking: Reported on 06/10/2023)     oxyCODONE 10 MG TABS Take 1 tablet (10 mg total) by mouth  every 6 (six) hours as needed for severe pain (pain score 7-10). Use 1 tab Q4hrs for pain and if continue to have severe pain ok to take 2 tabs Q4hrs 90 tablet 0   No current facility-administered medications for this visit.    REVIEW OF SYSTEMS:    Review of Systems - Oncology  All other pertinent systems were reviewed with the patient and are negative.  PHYSICAL EXAMINATION:    Onc Performance Status - 06/10/23 1145       ECOG Perf Status   ECOG Perf Status Restricted in physically strenuous activity but ambulatory and able to carry out work of a light or sedentary nature, e.g., light house work, office work      KPS SCALE   KPS % SCORE Able to carry on normal activity, minor s/s of disease             There were no vitals filed for this visit. There were no vitals filed for this visit.  Physical Exam Constitutional:      General: He is not in acute distress.    Appearance: Normal appearance.  HENT:     Head: Normocephalic and atraumatic.     Mouth/Throat:     Comments: Right oropharynx tonsil tumor eroding through soft palate, tumor reaches uvula, uvula swollen Eyes:     General: No scleral icterus.    Conjunctiva/sclera: Conjunctivae normal.  Cardiovascular:     Rate and Rhythm: Normal rate and regular rhythm.     Heart sounds: Normal heart sounds.  Pulmonary:     Effort: Pulmonary effort is normal.     Breath sounds: Normal breath sounds.  Abdominal:     General: There is no distension.  Musculoskeletal:     Right lower leg: No edema.     Left lower leg: No edema.  Lymphadenopathy:     Cervical: Cervical adenopathy: no definite lymphadenopathy palpable.  Neurological:     General: No focal deficit present.     Mental Status: He is alert and oriented to person, place, and time.  Psychiatric:        Mood and Affect: Mood normal.        Behavior: Behavior normal.        Thought Content: Thought content normal.       LABORATORY DATA:   I have  reviewed the data as listed.  No results found for any visits on 06/10/23.  Lab Results  Component Value Date   WBC 5.4 05/17/2023   HGB 14.8 05/17/2023   HCT 44.7 05/17/2023   MCV 93.7 05/17/2023   PLT 220.0 05/17/2023   Recent Labs    09/24/22 1620 05/17/23 0930 06/08/23 1137  NA 138 140 143  K 3.2* 4.0 3.5  CL 102 100 102  CO2 28 32 33*  GLUCOSE 106* 126* 115*  BUN 24* 28* 32*  CREATININE 0.93 0.83 0.76  CALCIUM 8.6* 9.2 9.3  GFRNONAA >60  --  >60  PROT 6.9  --   --   ALBUMIN 3.6  --   --   AST 26  --   --   ALT 22  --   --   ALKPHOS 65  --   --   BILITOT 0.8  --   --     No results found for this or any previous visit (from the past 72 hours).      RADIOGRAPHIC STUDIES:  I have personally reviewed the radiological images as listed and agree with the findings in the report.  NM PET Image Initial (PI) Skull Base To Thigh (F-18 FDG) Result Date: 06/08/2023 CLINICAL DATA:  Initial treatment strategy for staging of oro pharyngeal cancer. Squamous cell carcinoma of oropharynx. Radiation therapy for prostate cancer in 2024. EXAM: NUCLEAR MEDICINE PET SKULL BASE TO THIGH TECHNIQUE: 14.8 mCi F-18 FDG was injected intravenously. Full-ring PET imaging was performed from the skull base to  thigh after the radiotracer. CT data was obtained and used for attenuation correction and anatomic localization. Fasting blood glucose: 98 mg/dl COMPARISON:  Neck CT 08/65/7846.  Abdominopelvic CT 09/24/2022. FINDINGS: Mediastinal blood pool activity: SUV max 3.7 Liver activity: SUV max NA NECK: The eccentric right oropharyngeal mass is hypermetabolic. 4.1 x 3.4 cm and a S.U.V. max of 31.7 and 30/4. Right-sided level 2 node maintains its fatty hilum, but measures 8 mm and a S.U.V. max of 4.2 on 41/4. A left-sided level 2 node measures 5 mm and a S.U.V. max of 2.8 on 38/4. Incidental CT findings: Bilateral carotid atherosclerosis. Cerebral atrophy. CHEST: No areas of abnormal hypermetabolism.  Incidental CT findings: Mild cardiomegaly. Aortic and coronary artery calcification. ABDOMEN/PELVIS: No abdominopelvic parenchymal or nodal hypermetabolism. Incidental CT findings: Mild caudate lobe prominence. Punctate right renal collecting system calculi. Abdominal aortic atherosclerosis. Radiation seeds in the prostate. The previously described periprosthetic contrast (related to space-oar gel) has nearly completely resolved. SKELETON: No abnormal marrow activity. Incidental CT findings: None. IMPRESSION: 1. Right-sided oropharyngeal primary. Bilateral small mildly hypermetabolic level 2 nodes, favoring metastasis over reactive etiology. 2. No extracervical hypermetabolic metastasis. 3. Incidental findings, including: Coronary artery atherosclerosis. Aortic Atherosclerosis (ICD10-I70.0). Right nephrolithiasis Electronically Signed   By: Jeronimo Greaves M.D.   On: 06/08/2023 09:28   CT Soft Tissue Neck W Contrast Result Date: 05/17/2023 CLINICAL DATA:  Soft tissue infection suspected, neck, no prior imaging. 2 months of right maxillary pain, on exam edema of soft palate with erythema and ulcer. EXAM: CT NECK WITH CONTRAST TECHNIQUE: Multidetector CT imaging of the neck was performed using the standard protocol following the bolus administration of intravenous contrast. RADIATION DOSE REDUCTION: This exam was performed according to the departmental dose-optimization program which includes automated exposure control, adjustment of the mA and/or kV according to patient size and/or use of iterative reconstruction technique. CONTRAST:  75mL OMNIPAQUE IOHEXOL 300 MG/ML  SOLN COMPARISON:  None Available. FINDINGS: Pharynx and larynx: An ulcerated enhancing mass involving the superior oropharynx and soft palate/uvula measures 3.4 x 3.0 x 3.6 cm. The mass is centered on the right but does mildly cross midline. Salivary glands: No inflammation, mass, or stone. Thyroid: Diffusely lobular contour without a dominant lesion  apparent by CT. Lymph nodes: No enlarged or suspicious lymph nodes in the neck. Vascular: Major vascular structures of the neck are patent. Limited intracranial: Unremarkable. Visualized orbits: Bilateral cataract extraction. Mastoids and visualized paranasal sinuses: Mild mucosal thickening in the maxillary sinuses. Clear mastoid air cells. Skeleton: No suspicious bone lesion. Spondylosis, including bridging anterior vertebral osteophytes throughout the lower cervical and included upper thoracic spine. Upper chest: Clear lung apices. Other: None. IMPRESSION: 1. 3.6 cm ulcerated right-sided superior oropharyngeal mass compatible with malignancy. 2. No evidence of cervical lymphadenopathy. Electronically Signed   By: Sebastian Ache M.D.   On: 05/17/2023 15:43    Orders Placed This Encounter  Procedures   CBC with Differential (Cancer Center Only)    Standing Status:   Future    Expiration Date:   06/09/2024   CMP (Cancer Center only)    Standing Status:   Future    Expiration Date:   06/09/2024   TSH    Standing Status:   Future    Expiration Date:   06/09/2024    CODE STATUS:   Future Appointments  Date Time Provider Department Center  06/14/2023  8:30 AM [facility] INFUSION [facility] None  06/16/2023  8:30 AM [facility] INFUSION [facility] None  06/18/2023 11:00 AM [facility]  1 [facility]  06/18/2023 12:00 PM [facility] 1 [facility]  06/18/2023  3:00 PM [facility] INFUSION [facility] None  06/24/2023  4:00 PM [place], [place], MD [facility] None  06/28/2023  1:00 PM [facility] SPEECH THERAPIST [facility] [place]  06/28/2023  1:00 PM [facility] R/F 2 [facility] [place]     I spent a total of 60 minutes during this encounter with the patient including review of chart and various tests results, discussions about plan of care and coordination of care plan.  This document was completed utilizing speech recognition software. Grammatical errors, random word insertions, pronoun errors, and incomplete sentences are an  occasional consequence of this system due to software limitations, ambient noise, and hardware issues. Any formal questions or concerns about the content, text or information contained within the body of this dictation should be directly addressed to the provider for clarification.

## 2023-06-11 ENCOUNTER — Encounter: Payer: Self-pay | Admitting: Radiation Oncology

## 2023-06-11 ENCOUNTER — Ambulatory Visit: Payer: Medicare Other | Admitting: Radiation Oncology

## 2023-06-11 ENCOUNTER — Encounter: Payer: Self-pay | Admitting: Oncology

## 2023-06-11 ENCOUNTER — Ambulatory Visit: Payer: Medicare Other

## 2023-06-11 DIAGNOSIS — G893 Neoplasm related pain (acute) (chronic): Secondary | ICD-10-CM | POA: Insufficient documentation

## 2023-06-11 DIAGNOSIS — Z7189 Other specified counseling: Secondary | ICD-10-CM | POA: Insufficient documentation

## 2023-06-11 MED ORDER — PROCHLORPERAZINE MALEATE 10 MG PO TABS: 10.0000 mg | ORAL_TABLET | Freq: Four times a day (QID) | ORAL | 1 refills | Status: DC | PRN

## 2023-06-11 MED ORDER — ONDANSETRON HCL 8 MG PO TABS: 8.0000 mg | ORAL_TABLET | Freq: Three times a day (TID) | ORAL | 1 refills | Status: DC | PRN

## 2023-06-11 MED ORDER — DEXAMETHASONE 4 MG PO TABS: ORAL_TABLET | ORAL | 1 refills | Status: DC

## 2023-06-11 MED ORDER — LIDOCAINE-PRILOCAINE 2.5-2.5 % EX CREA: TOPICAL_CREAM | CUTANEOUS | 3 refills | Status: DC

## 2023-06-11 NOTE — Addendum Note (Signed)
 Addended by: Meryl Crutch on: 06/11/2023 01:27 PM   Modules accepted: Orders

## 2023-06-11 NOTE — Assessment & Plan Note (Signed)
 Severe pain associated with oropharyngeal cancer, exacerbated by eating and drinking. Current pain management with oxycodone and lidocaine is insufficient. - Increase oxycodone to 10 mg QID - Use lidocaine before meals

## 2023-06-11 NOTE — Assessment & Plan Note (Signed)
 Please review oncology history for additional details and timeline of events.  Symptoms include severe pain and dysphagia since mid-November, progressively worsening.   cT3,cN2,cM0,p16+ tumor, Stage II disease.   His case was discussed in tumor conference on 06/09/2023.  Given the extent of disease, consensus opinion is to proceed with concurrent chemoradiation.  He already had consultation with Dr. Basilio Cairo.   We have discussed about role of cisplatin being a radiosensitizer in the treatment of head and neck cancer.  We have discussed about the curative intent of chemoradiation for this patient.     We have discussed about mechanism of action of cisplatin, adverse effects of cisplatin including but not limited to fatigue, nausea, vomiting, increased risk of infections, mucositis, ototoxicity, nephrotoxicity, peripheral neuropathy.  Patient understands that some of the side effects can be permanent and even potentially fatal.  We have discussed about role of Mediport and G-tube for chemotherapy administration and nutrition respectively since most of these patients have severe mucositis during the treatment.  At this time we do not know if weekly cisplatin is inferior to every 21 days cisplatin since there is no head-to-head comparison trial.  I did mention to the patient however weekly cisplatin is well-tolerated with less adverse effects and most of the patients tend to complete treatment as planned.  Patient is willing to proceed with weekly cisplatin.  - Coordinate with Dr. Basilio Cairo for radiation therapy - Provide formal chemotherapy education session - Monitor blood counts and renal function weekly - Administer IV fluids pre- and post-chemotherapy - Ensure home hydration (80-90 oz non-caffeinated fluids daily) - Monitor and replace electrolytes as needed - Schedule dental evaluation and plan treatment start accordingly - Order blood work including thyroid function and CBC  Referral sent for  Port-A-Cath placement and feeding tube placement.

## 2023-06-11 NOTE — Assessment & Plan Note (Signed)
-   Schedule formal chemotherapy education session - Administer IV fluids on Monday, Wednesday, and Friday next week and then as needed thereafter

## 2023-06-11 NOTE — Progress Notes (Signed)
START ON PATHWAY REGIMEN - Head and Neck     A cycle is every 7 days:     Cisplatin   **Always confirm dose/schedule in your pharmacy ordering system**  Patient Characteristics: Oropharynx, HPV Positive, Preoperative or Nonsurgical Candidate (Clinical Staging), cT0-4, cN1-3 or cT3-4, cN0 Disease Classification: Oropharynx HPV Status: Positive (+) Therapeutic Status: Preoperative or Nonsurgical Candidate (Clinical Staging) AJCC T Category: cT3 AJCC 8 Stage Grouping: II AJCC N Category: cN2 AJCC M Category: cM0 Intent of Therapy: Curative Intent, Discussed with Patient 

## 2023-06-12 ENCOUNTER — Other Ambulatory Visit: Payer: Self-pay

## 2023-06-14 ENCOUNTER — Other Ambulatory Visit: Payer: Self-pay

## 2023-06-14 ENCOUNTER — Institutional Professional Consult (permissible substitution) (INDEPENDENT_AMBULATORY_CARE_PROVIDER_SITE_OTHER): Payer: Medicare Other | Admitting: Otolaryngology

## 2023-06-14 ENCOUNTER — Inpatient Hospital Stay: Payer: Medicare Other

## 2023-06-14 ENCOUNTER — Other Ambulatory Visit: Payer: Self-pay | Admitting: *Deleted

## 2023-06-14 ENCOUNTER — Telehealth: Payer: Self-pay | Admitting: Oncology

## 2023-06-14 VITALS — BP 127/56 | HR 57 | Temp 99.2°F | Resp 16

## 2023-06-14 DIAGNOSIS — Z923 Personal history of irradiation: Secondary | ICD-10-CM | POA: Diagnosis not present

## 2023-06-14 DIAGNOSIS — C090 Malignant neoplasm of tonsillar fossa: Secondary | ICD-10-CM

## 2023-06-14 DIAGNOSIS — R1310 Dysphagia, unspecified: Secondary | ICD-10-CM | POA: Diagnosis not present

## 2023-06-14 DIAGNOSIS — Z79891 Long term (current) use of opiate analgesic: Secondary | ICD-10-CM | POA: Diagnosis not present

## 2023-06-14 DIAGNOSIS — Z87891 Personal history of nicotine dependence: Secondary | ICD-10-CM | POA: Diagnosis not present

## 2023-06-14 DIAGNOSIS — G893 Neoplasm related pain (acute) (chronic): Secondary | ICD-10-CM | POA: Diagnosis not present

## 2023-06-14 LAB — CBC WITH DIFFERENTIAL (CANCER CENTER ONLY)
Abs Immature Granulocytes: 0 10*3/uL (ref 0.00–0.07)
Basophils Absolute: 0 10*3/uL (ref 0.0–0.1)
Basophils Relative: 1 %
Eosinophils Absolute: 0.2 10*3/uL (ref 0.0–0.5)
Eosinophils Relative: 3 %
HCT: 40.6 % (ref 39.0–52.0)
Hemoglobin: 13.6 g/dL (ref 13.0–17.0)
Immature Granulocytes: 0 %
Lymphocytes Relative: 29 %
Lymphs Abs: 1.4 10*3/uL (ref 0.7–4.0)
MCH: 31.1 pg (ref 26.0–34.0)
MCHC: 33.5 g/dL (ref 30.0–36.0)
MCV: 92.7 fL (ref 80.0–100.0)
Monocytes Absolute: 0.5 10*3/uL (ref 0.1–1.0)
Monocytes Relative: 11 %
Neutro Abs: 2.7 10*3/uL (ref 1.7–7.7)
Neutrophils Relative %: 56 %
Platelet Count: 192 10*3/uL (ref 150–400)
RBC: 4.38 MIL/uL (ref 4.22–5.81)
RDW: 13.6 % (ref 11.5–15.5)
WBC Count: 4.8 10*3/uL (ref 4.0–10.5)
nRBC: 0 % (ref 0.0–0.2)

## 2023-06-14 LAB — CMP (CANCER CENTER ONLY)
ALT: 23 U/L (ref 0–44)
AST: 25 U/L (ref 15–41)
Albumin: 3.9 g/dL (ref 3.5–5.0)
Alkaline Phosphatase: 69 U/L (ref 38–126)
Anion gap: 8 (ref 5–15)
BUN: 27 mg/dL — ABNORMAL HIGH (ref 8–23)
CO2: 31 mmol/L (ref 22–32)
Calcium: 9.2 mg/dL (ref 8.9–10.3)
Chloride: 100 mmol/L (ref 98–111)
Creatinine: 0.67 mg/dL (ref 0.61–1.24)
GFR, Estimated: >60 mL/min (ref >60)
Glucose, Bld: 127 mg/dL — ABNORMAL HIGH (ref 70–99)
Potassium: 3.3 mmol/L — ABNORMAL LOW (ref 3.5–5.1)
Sodium: 139 mmol/L (ref 135–145)
Total Bilirubin: 0.8 mg/dL (ref 0.0–1.2)
Total Protein: 6.8 g/dL (ref 6.5–8.1)

## 2023-06-14 LAB — TSH: TSH: 2.872 u[IU]/mL (ref 0.350–4.500)

## 2023-06-14 MED ORDER — SODIUM CHLORIDE 0.9 % IV SOLN: Freq: Once | INTRAVENOUS | Status: AC

## 2023-06-14 NOTE — Telephone Encounter (Signed)
 Patient has been made aware of appointment details.

## 2023-06-14 NOTE — Patient Instructions (Signed)

## 2023-06-14 NOTE — Progress Notes (Signed)
 Oncology Nurse Navigator Documentation   Met with Isaac Carpenter and his wife to provide PEG education prior to 06/18/23 placement.   Using  PEG teaching device   and Teach Back, provided education for PEG use and care, including: hand hygiene, gravity bolus administration of daily water flushes and nutritional supplement, fluids and medications; care of tube insertion site including daily dressing change and cleaning; S&S of infection.   Mrs. Penny correctly verbalized procedures for and provided correct return demonstration of gravity administration of water, dressing change and site care.  I provided written instructions for PEG flushing/dressing change in support of verbal instruction.   I provided/described contents of Start of Care Bolus Feeding Kit (2 60 cc syringes, 1 box 4x4 drainage sponges, 1 package mesh briefs, 1 roll paper tape, 1 case Osmolite 1.5).  He voiced understanding he is to start using Osmolite per guidance of Nutrition. He understands I will be available for ongoing PEG support. Provided barium sulfate prep which I obtained from [facility] IR and reviewed instructions.    Hedda Slade RN, BSN, OCN Head & Neck Oncology Nurse Navigator [facility] at [facility] Phone # [phone]  Fax # [phone]

## 2023-06-15 ENCOUNTER — Encounter: Payer: Self-pay | Admitting: Oncology

## 2023-06-16 ENCOUNTER — Inpatient Hospital Stay: Payer: Medicare Other | Admitting: Dietician

## 2023-06-16 ENCOUNTER — Inpatient Hospital Stay: Payer: Medicare Other

## 2023-06-16 ENCOUNTER — Other Ambulatory Visit: Payer: Medicare Other

## 2023-06-16 ENCOUNTER — Ambulatory Visit: Payer: Medicare Other | Admitting: Oncology

## 2023-06-16 VITALS — BP 128/60 | HR 52 | Temp 97.6°F | Resp 17 | Wt 280.8 lb

## 2023-06-16 DIAGNOSIS — C090 Malignant neoplasm of tonsillar fossa: Secondary | ICD-10-CM | POA: Diagnosis not present

## 2023-06-16 DIAGNOSIS — R1310 Dysphagia, unspecified: Secondary | ICD-10-CM | POA: Diagnosis not present

## 2023-06-16 DIAGNOSIS — Z923 Personal history of irradiation: Secondary | ICD-10-CM | POA: Diagnosis not present

## 2023-06-16 DIAGNOSIS — G893 Neoplasm related pain (acute) (chronic): Secondary | ICD-10-CM | POA: Diagnosis not present

## 2023-06-16 DIAGNOSIS — Z87891 Personal history of nicotine dependence: Secondary | ICD-10-CM | POA: Diagnosis not present

## 2023-06-16 DIAGNOSIS — Z79891 Long term (current) use of opiate analgesic: Secondary | ICD-10-CM | POA: Diagnosis not present

## 2023-06-16 MED ORDER — SODIUM CHLORIDE 0.9 % IV SOLN: Freq: Once | INTRAVENOUS | Status: AC

## 2023-06-16 NOTE — Progress Notes (Signed)
 Nutrition Assessment   Reason for Assessment: HNC   ASSESSMENT: 73 year old male with stage II SCC of right tonsil, p16 positive. He is planning to receive concurrent chemoradiation (first chemo 3/14). Patient is under the care of Dr. Basilio Cairo and Dr. Pedro Earls scheduled 2/28  Past medical history includes prostate cancer s/p seed implant/brachytherapy (06/2022), OSA, HTN, hypothyroidism, BPH, HLD, DM2  Met with patient and wife in infusion. He is receiving IVF today secondary to significant dysphagia/odynophagia. Patient reports increased po intake in the last couple of days. Feels pain related to recent biopsy as he did not have this before procedure. Patient says this area is healing and not experiencing prior reported pain. He ate a hamburger (cheese, lettuce, mayo/must, onion) and fries from [employer] yesterday afternoon. Ate good portion of stew beef, rice, stewed apples, green beans for dinner last night. Patient is planning to eat fried fish this evening. He enjoys milk and has been drinking a few glasses/day. He tried Ensure, but did not care for it per wife. Patient is drinking some water as well as unsweetened tea. Reports thin liquids are more challenging to swallow than thicker (milk). Patient is not looking forward to having feeding tube. Hoping to not use it now that he is eating better.   Nutrition Focused Physical Exam: deferred    Medications: lipitor, decadron, gabapentin, synthroid, lidocaine, MMW, zofran, compazine, flomax vesicare   Labs: K 3.3, glucose 127, BUN 27   Anthropometrics:   Height: 6'1" Weight: 280 lb 12 oz  UBW: 300 lb (April/May) BMI: 37.04    NUTRITION DIAGNOSIS: Unintended wt loss related to [facility] as evidenced by reported dysphagia with solids/liquids, 7% wt loss from usual wt in 9 months which is insignificant for time frame.    INTERVENTION:  Continue eating orally as tolerated. Encourage soft moist textures for ease of intake - handout with  ideas provided  Provided samples of CIB powder, instructed to mix with whole milk for alternate ONS - recommend drinking TID for added calories and protein - shake recipes provided  Reviewed water flush with pt and wife. Provide daily FWF after tube placement Educated on bolus feedings - will hold on initiating given improved po. Wife to call if po declines prior to next follow-up Contact information provided     MONITORING, EVALUATION, GOAL: Pt will tolerate increased calories and protein to minimize further wt loss    Next Visit: Friday March 14 during infusion

## 2023-06-17 ENCOUNTER — Other Ambulatory Visit: Payer: Self-pay | Admitting: Physician Assistant

## 2023-06-17 ENCOUNTER — Inpatient Hospital Stay: Payer: Medicare Other

## 2023-06-17 DIAGNOSIS — Z01818 Encounter for other preprocedural examination: Secondary | ICD-10-CM

## 2023-06-17 MED ORDER — DEXTROSE 5 % IV SOLN: 2.0000 g | Freq: Once | INTRAVENOUS | Status: DC

## 2023-06-17 NOTE — Progress Notes (Signed)
 [facility] Clinical Social Work  Initial Assessment   Isaac Carpenter is a 73 y.o. year old male contacted caregiver by phone. Clinical Social Work was referred by new patient protocol for assessment of psychosocial needs.   SDOH (Social Determinants of Health) assessments performed: Yes SDOH Interventions    Flowsheet Row Clinical Support from 10/08/2021 in [facility] [place] Clinical Support from 09/30/2020 in [facility] [place]  SDOH Interventions    Food Insecurity Interventions Intervention Not Indicated Intervention Not Indicated  Housing Interventions Intervention Not Indicated Intervention Not Indicated  Transportation Interventions Intervention Not Indicated Intervention Not Indicated  Financial Strain Interventions Intervention Not Indicated Intervention Not Indicated  Physical Activity Interventions Intervention Not Indicated --  Stress Interventions Intervention Not Indicated Intervention Not Indicated  Social Connections Interventions Intervention Not Indicated Intervention Not Indicated       SDOH Screenings   Food Insecurity: No Food Insecurity (06/17/2023)  Housing: Low Risk  (06/17/2023)  Transportation Needs: No Transportation Needs (06/17/2023)  Utilities: Not At Risk (06/17/2023)  Alcohol Screen: Low Risk  (07/17/2022)  Depression (PHQ2-9): Low Risk  (06/17/2023)  Financial Resource Strain: Low Risk  (10/08/2021)  Physical Activity: Inactive (10/08/2021)  Social Connections: Socially Isolated (10/08/2021)  Stress: No Stress Concern Present (10/08/2021)  Tobacco Use: High Risk (06/11/2023)     Distress Screen completed: No    09/24/2022    2:33 PM  ONCBCN DISTRESS SCREENING  Distress experienced in past week (1-10) 8  Emotional problem type Nervousness/Anxiety;Adjusting to illness      Family/Social Information:  Housing Arrangement: patient lives with his spouse. With sons and grandchildren across the street. Family  members/support persons in your life? Family Transportation concerns: no  Employment: Retired .  Income source: Actor concerns: No Type of concern: None Food access concerns: no Religious or spiritual practice: No Advanced directives:  Patient is not interested in completing living will. Patient and spouse have been together for 30+ years and have  HCPOA completed. CSW provided options for patient to have it uploaded in the [facility] care system. Services Currently in place:  Insurance, Income, Family  Coping/ Adjustment to diagnosis: Patient understands treatment plan and what happens next? yes Concerns about diagnosis and/or treatment: I'm not especially worried about anything Patient reported stressors: Adjusting to my illness Patient enjoys being outside and time with family/ friends Current coping skills/ strengths: Ability for insight , Active sense of humor , Average or above average intelligence , Capable of independent living , Communication skills , Financial means , General fund of knowledge , Motivation for treatment/growth , Physical Health , Special hobby/interest , and Supportive family/friends     SUMMARY: Current SDOH Barriers:  No current SDOH concerns.   Clinical Social Work Clinical Goal(s):  No clinical social work goals at this time  Interventions: Discussed common feeling and emotions when being diagnosed with cancer, and the importance of support during treatment Informed patient of the support team roles and support services at [facility] Provided CSW contact information and encouraged patient to call with any questions or concerns CSW Provided education on [place] and how upload completed documents.   Follow Up Plan: CSW will see patient on 3/14 during infusion Patient verbalizes understanding of plan: Yes    Marguerita Merles, LCSW Clinical Social Worker [facility]

## 2023-06-18 ENCOUNTER — Ambulatory Visit (HOSPITAL_COMMUNITY)
Admission: RE | Admit: 2023-06-18 | Discharge: 2023-06-18 | Disposition: A | Discharge status: Home / Self Care | Payer: Medicare Other | Source: Ambulatory Visit | Attending: Oncology | Admitting: Oncology

## 2023-06-18 ENCOUNTER — Other Ambulatory Visit: Payer: Self-pay

## 2023-06-18 ENCOUNTER — Encounter (HOSPITAL_COMMUNITY): Payer: Self-pay

## 2023-06-18 ENCOUNTER — Inpatient Hospital Stay: Payer: Medicare Other

## 2023-06-18 ENCOUNTER — Other Ambulatory Visit: Payer: Self-pay | Admitting: Student

## 2023-06-18 DIAGNOSIS — C090 Malignant neoplasm of tonsillar fossa: Secondary | ICD-10-CM | POA: Insufficient documentation

## 2023-06-18 DIAGNOSIS — F17220 Nicotine dependence, chewing tobacco, uncomplicated: Secondary | ICD-10-CM | POA: Insufficient documentation

## 2023-06-18 DIAGNOSIS — E785 Hyperlipidemia, unspecified: Secondary | ICD-10-CM | POA: Diagnosis not present

## 2023-06-18 DIAGNOSIS — I10 Essential (primary) hypertension: Secondary | ICD-10-CM | POA: Insufficient documentation

## 2023-06-18 DIAGNOSIS — Z01818 Encounter for other preprocedural examination: Secondary | ICD-10-CM

## 2023-06-18 DIAGNOSIS — F17290 Nicotine dependence, other tobacco product, uncomplicated: Secondary | ICD-10-CM | POA: Insufficient documentation

## 2023-06-18 DIAGNOSIS — G4730 Sleep apnea, unspecified: Secondary | ICD-10-CM | POA: Insufficient documentation

## 2023-06-18 HISTORY — PX: IR GASTROSTOMY TUBE MOD SED: IMG625

## 2023-06-18 HISTORY — PX: IR IMAGING GUIDED PORT INSERTION: IMG5740

## 2023-06-18 LAB — CBC
HCT: 37.1 % — ABNORMAL LOW (ref 39.0–52.0)
Hemoglobin: 12.5 g/dL — ABNORMAL LOW (ref 13.0–17.0)
MCH: 30.9 pg (ref 26.0–34.0)
MCHC: 33.7 g/dL (ref 30.0–36.0)
MCV: 91.6 fL (ref 80.0–100.0)
Platelets: 212 10*3/uL (ref 150–400)
RBC: 4.05 MIL/uL — ABNORMAL LOW (ref 4.22–5.81)
RDW: 13.8 % (ref 11.5–15.5)
WBC: 4.3 10*3/uL (ref 4.0–10.5)
nRBC: 0 % (ref 0.0–0.2)

## 2023-06-18 LAB — PROTIME-INR
INR: 1.1 (ref 0.8–1.2)
Prothrombin Time: 14.8 s (ref 11.4–15.2)

## 2023-06-18 MED ORDER — MIDAZOLAM HCL 2 MG/2ML IJ SOLN
INTRAMUSCULAR | Status: AC | PRN
  Administered 2023-06-18 (×3): .5 mg via INTRAVENOUS
  Administered 2023-06-18: 1 mg via INTRAVENOUS
  Administered 2023-06-18: .5 mg via INTRAVENOUS

## 2023-06-18 MED ORDER — LIDOCAINE VISCOUS HCL 2 % MT SOLN
OROMUCOSAL | Status: AC
  Filled 2023-06-18: qty 15

## 2023-06-18 MED ORDER — MIDAZOLAM HCL 2 MG/2ML IJ SOLN
INTRAMUSCULAR | Status: AC
  Filled 2023-06-18: qty 2

## 2023-06-18 MED ORDER — FENTANYL CITRATE (PF) 100 MCG/2ML IJ SOLN
INTRAMUSCULAR | Status: AC | PRN
  Administered 2023-06-18: 25 ug via INTRAVENOUS
  Administered 2023-06-18: 50 ug via INTRAVENOUS
  Administered 2023-06-18 (×3): 25 ug via INTRAVENOUS

## 2023-06-18 MED ORDER — GLUCAGON HCL RDNA (DIAGNOSTIC) 1 MG IJ SOLR
INTRAMUSCULAR | Status: AC | PRN
  Administered 2023-06-18: 1 mg via INTRAVENOUS

## 2023-06-18 MED ORDER — LIDOCAINE-EPINEPHRINE 1 %-1:100000 IJ SOLN
INTRAMUSCULAR | Status: AC
  Filled 2023-06-18: qty 1

## 2023-06-18 MED ORDER — SODIUM CHLORIDE 0.9% FLUSH: 3.0000 mL | INTRAVENOUS | Status: DC | PRN

## 2023-06-18 MED ORDER — FENTANYL CITRATE (PF) 100 MCG/2ML IJ SOLN
INTRAMUSCULAR | Status: AC
  Filled 2023-06-18: qty 2

## 2023-06-18 MED ORDER — CEFAZOLIN SODIUM-DEXTROSE 2-4 GM/100ML-% IV SOLN
INTRAVENOUS | Status: AC | PRN
  Administered 2023-06-18: 2 g via INTRAVENOUS

## 2023-06-18 MED ORDER — HYDROCODONE-ACETAMINOPHEN 5-325 MG PO TABS: 1.0000 | ORAL_TABLET | ORAL | Status: DC | PRN

## 2023-06-18 MED ORDER — IOHEXOL 300 MG/ML  SOLN
50.0000 mL | Freq: Once | INTRAMUSCULAR | Status: AC | PRN
  Administered 2023-06-18: 25 mL

## 2023-06-18 MED ORDER — GLUCAGON HCL RDNA (DIAGNOSTIC) 1 MG IJ SOLR
INTRAMUSCULAR | Status: AC
  Filled 2023-06-18: qty 1

## 2023-06-18 MED ORDER — HEPARIN SOD (PORK) LOCK FLUSH 100 UNIT/ML IV SOLN
500.0000 [IU] | Freq: Once | INTRAVENOUS | Status: DC
Start: 2023-06-18 — End: 2023-06-19

## 2023-06-18 MED ORDER — CEFAZOLIN SODIUM-DEXTROSE 2-4 GM/100ML-% IV SOLN
INTRAVENOUS | Status: AC
  Filled 2023-06-18: qty 100

## 2023-06-18 MED ORDER — LIDOCAINE HCL 1 % IJ SOLN: 20.0000 mL | Freq: Once | INTRAMUSCULAR | Status: DC

## 2023-06-18 MED ORDER — HEPARIN SOD (PORK) LOCK FLUSH 100 UNIT/ML IV SOLN
INTRAVENOUS | Status: AC
  Filled 2023-06-18: qty 5

## 2023-06-18 MED ORDER — SODIUM CHLORIDE 0.9% FLUSH: 3.0000 mL | Freq: Two times a day (BID) | INTRAVENOUS | Status: DC

## 2023-06-18 MED ORDER — DEXTROSE 5 % IV SOLN: 2.0000 g | INTRAVENOUS | Status: AC

## 2023-06-18 MED ORDER — LIDOCAINE HCL 1 % IJ SOLN
INTRAMUSCULAR | Status: AC
  Filled 2023-06-18: qty 20

## 2023-06-18 NOTE — Procedures (Signed)
 Interventional Radiology Procedure:   Indications: Tonsillar cancer  Procedure: Port placement and gastrostomy tube placement  Findings: Right jugular port, tip at SVC/RA junction. 18 Fr Entuit tube placed and confirmed in stomach.    Complications: None     EBL: Minimal, less than 10 ml  Plan: Discharge in two hours.  Keep port site and incisions dry for at least 24 hours.  May start using gastrostomy tube after 4 hours. Will arrange for T-fastener removal in 2 weeks   Isaac Nunn R. Lowella Dandy, MD  Pager: [phone]

## 2023-06-18 NOTE — H&P (Signed)
 Chief Complaint: Patient was seen in consultation today for tonsillar cancer  Referring Physician(s): Pasam,Avinash  Supervising Physician: Oley Balm  Patient Status: [facility] - Out-pt  History of Present Illness: Isaac Carpenter is a 73 y.o. male with past medical history of HLD, HTN, sleep apnea, recently diagnosed with stage II SCC of right tonsil, p16 positive. He is planning to undergo concurrent chemoradiation.  He has been referred to IR for Port-A-Cath and gastrostomy tube placement at the request of Dr. Arlana Pouch.   Patient presents to [facility] today for procedure in his usual state of health.  He reports he does have some sore throat which is consistent with discomfort since cancer diagnosis.  He is aware of the plans for combination Port and G-tube placement today.  He is agreeable to proceed.  His wife is available post-procedure care and transportation.   Past Medical History:  Diagnosis Date   Arthritis    Cancer (HCC)    History of partial thyroidectomy STATES OVERACTIVE THYROID-- NO ISSUES SINCE AGE 78 AND NO MEDS   Hyperlipidemia    Hypertension    Hypothyroidism    Sleep apnea    Per patient he tried CPAP but could not tolerate.   Thyroid disease     Past Surgical History:  Procedure Laterality Date   CIRCUMCISION  01/04/2012   Procedure: CIRCUMCISION ADULT;  Surgeon: Kathi Ludwig, MD;  Location: [facility];  Service: Urology;  Laterality: N/A;   COLONOSCOPY  04/08/2020   Vito Cirigliano   CYSTOSCOPY  09/04/2022   Procedure: CYSTOSCOPY;  Surgeon: Marcine Matar, MD;  Location: [facility];  Service: Urology;;   RADIOACTIVE SEED IMPLANT N/A 09/04/2022   Procedure: RADIOACTIVE SEED IMPLANT/BRACHYTHERAPY IMPLANT;  Surgeon: Marcine Matar, MD;  Location: [facility];  Service: Urology;  Laterality: N/A;  90 MINS   SPACE OAR INSTILLATION N/A 09/04/2022   Procedure: SPACE OAR INSTILLATION;  Surgeon:  Marcine Matar, MD;  Location: [facility];  Service: Urology;  Laterality: N/A;   THYROIDECTOMY, PARTIAL  AGE 78   OVERACTIVE THYROID    Allergies: Testosterone  Medications: Prior to Admission medications   Medication Sig Start Date End Date Taking? Authorizing Provider  acetaminophen (TYLENOL) 500 MG tablet Take 1 tablet (500 mg total) by mouth every 6 (six) hours. Please take every 6 hrs and stagger with Motrin 3 hrs apart from each other. Please do not exceed maximum daily dose to avoid liver damage 05/27/23  Yes Ashok Croon, MD  atorvastatin (LIPITOR) 20 MG tablet Take 1 tablet (20 mg total) by mouth daily. 05/17/23  Yes [name], [name], MD  ibuprofen (ADVIL) 600 MG tablet Take 1 tablet (600 mg total) by mouth every 6 (six) hours. Please take every 6 hrs, and stagger this medication 3 hrs apart from Tylenol 05/27/23  Yes Ashok Croon, MD  levothyroxine (SYNTHROID) 88 MCG tablet TAKE ONE TABLET BY MOUTH DAILY. 10/02/22  Yes [name], [name], MD  lidocaine (XYLOCAINE) 2 % solution Use as directed 15 mLs in the mouth or throat every 4 (four) hours as needed for mouth pain. 06/04/23  Yes Ashok Croon, MD  lisinopril-hydrochlorothiazide (ZESTORETIC) 20-25 MG tablet Take 1 tablet by mouth daily. 10/02/22  Yes [name], [name], MD  oxyCODONE 10 MG TABS Take 1 tablet (10 mg total) by mouth every 6 (six) hours as needed for severe pain (pain score 7-10). Use 1 tab Q4hrs for pain and if continue to have severe pain  ok to take 2 tabs Q4hrs 06/10/23  Yes Pasam, Avinash, MD  solifenacin (VESICARE) 5 MG tablet Take 5 mg by mouth daily. 10/12/22  Yes [provider]  tamsulosin (FLOMAX) 0.4 MG CAPS capsule Take 1 capsule (0.4 mg total) by mouth 2 (two) times daily after a meal. 09/25/22  Yes Bruning, Ashlyn, PA-C  aspirin EC 81 MG tablet Take 81 mg by mouth daily. Swallow whole.    [provider]  dexamethasone (DECADRON) 4 MG tablet Take 2 tablets (8 mg) by mouth  daily x 3 days starting the day after cisplatin chemotherapy. Take with food. 06/11/23   Pasam, Archie Patten, MD  gabapentin (NEURONTIN) 100 MG capsule Take 1 capsule (100 mg total) by mouth 3 (three) times daily. Patient not taking: Reported on 06/10/2023 06/04/23   Ashok Croon, MD  lidocaine-prilocaine (EMLA) cream Apply to affected area once 06/11/23   Pasam, Avinash, MD  magic mouthwash SOLN TAKE BY MOUTH THREE TIMES DAILY AS NEEDED FOR UP TO 10 DAYS FOR MOUTH PAIN Patient not taking: Reported on 06/10/2023 05/21/23   [provider]  ondansetron (ZOFRAN) 8 MG tablet Take 1 tablet (8 mg total) by mouth every 8 (eight) hours as needed for nausea or vomiting. Start on the third day after cisplatin. 06/11/23   Pasam, Archie Patten, MD  prochlorperazine (COMPAZINE) 10 MG tablet Take 1 tablet (10 mg total) by mouth every 6 (six) hours as needed (Nausea or vomiting). 06/11/23   Pasam, Archie Patten, MD     Family History  Problem Relation Age of Onset   Cancer Mother    COPD Father    Heart attack Father    Cancer Sister    Cancer Brother    Cancer Sister    Cancer Brother    Colon cancer Neg Hx    Colon polyps Neg Hx    Esophageal cancer Neg Hx    Rectal cancer Neg Hx    Stomach cancer Neg Hx     Social History   Socioeconomic History   Marital status: Married    Spouse name: Not on file   Number of children: Not on file   Years of education: Not on file   Highest education level: Not on file  Occupational History   Not on file  Tobacco Use   Smoking status: Never   Smokeless tobacco: Current    Types: Chew   Tobacco comments:    started chewing tobacco off and on since 1970  Vaping Use   Vaping status: Never Used  Substance and Sexual Activity   Alcohol use: Never   Drug use: No   Sexual activity: Not Currently  Other Topics Concern   Not on file  Social History Narrative   Not on file   Social Drivers of Health   Financial Resource Strain: Low Risk  (10/08/2021)    Overall Financial Resource Strain (CARDIA)    Difficulty of Paying Living Expenses: Not hard at all  Food Insecurity: No Food Insecurity (06/17/2023)   Hunger Vital Sign    Worried About Running Out of Food in the Last Year: Never true    Ran Out of Food in the Last Year: Never true  Transportation Needs: No Transportation Needs (06/17/2023)   PRAPARE - Administrator, Civil Service (Medical): No    Lack of Transportation (Non-Medical): No  Physical Activity: Inactive (10/08/2021)   Exercise Vital Sign    Days of Exercise per Week: 0 days    Minutes  of Exercise per Session: 0 min  Stress: No Stress Concern Present (10/08/2021)   [employer] of Occupational Health - Occupational Stress Questionnaire    Feeling of Stress : Not at all  Social Connections: Socially Isolated (10/08/2021)   Social Connection and Isolation Panel [NHANES]    Frequency of Communication with Friends and Family: More than three times a week    Frequency of Social Gatherings with Friends and Family: More than three times a week    Attends Religious Services: Never    Database administrator or Organizations: No    Attends Banker Meetings: Never    Marital Status: Divorced     Review of Systems: A 12 point ROS discussed and pertinent positives are indicated in the HPI above.  All other systems are negative.  Review of Systems  Constitutional:  Negative for fatigue and fever.  Respiratory:  Negative for cough and shortness of breath.   Cardiovascular:  Negative for chest pain.  Gastrointestinal:  Negative for abdominal pain, nausea and vomiting.  Musculoskeletal:  Negative for back pain.  Psychiatric/Behavioral:  Negative for behavioral problems and confusion.     Vital Signs: BP (!) 129/55   Pulse (!) 47   Temp 97.9 F (36.6 C) (Oral)   Resp 18   Ht 6\' 1"  (1.854 m)   Wt 280 lb (127 kg)   SpO2 94%   BMI 36.94 kg/m   Physical Exam Vitals and nursing note reviewed.   Constitutional:      General: He is not in acute distress.    Appearance: Normal appearance. He is not ill-appearing.  HENT:     Mouth/Throat:     Mouth: Mucous membranes are moist.     Pharynx: Oropharynx is clear.  Cardiovascular:     Rate and Rhythm: Bradycardia present.  Pulmonary:     Effort: Pulmonary effort is normal.     Breath sounds: Normal breath sounds.  Abdominal:     General: Abdomen is flat. There is no distension.     Palpations: Abdomen is soft.  Skin:    General: Skin is warm and dry.  Neurological:     General: No focal deficit present.     Mental Status: He is alert and oriented to person, place, and time. Mental status is at baseline.  Psychiatric:        Mood and Affect: Mood normal.        Behavior: Behavior normal.        Thought Content: Thought content normal.        Judgment: Judgment normal.      MD Evaluation Airway: WNL Heart: WNL Abdomen: WNL Chest/ Lungs: WNL ASA  Classification: 3 Mallampati/Airway Score: Two   Imaging: NM PET Image Initial (PI) Skull Base To Thigh (F-18 FDG) Result Date: 06/08/2023 CLINICAL DATA:  Initial treatment strategy for staging of oro pharyngeal cancer. Squamous cell carcinoma of oropharynx. Radiation therapy for prostate cancer in 2024. EXAM: NUCLEAR MEDICINE PET SKULL BASE TO THIGH TECHNIQUE: 14.8 mCi F-18 FDG was injected intravenously. Full-ring PET imaging was performed from the skull base to thigh after the radiotracer. CT data was obtained and used for attenuation correction and anatomic localization. Fasting blood glucose: 98 mg/dl COMPARISON:  Neck CT 16/01/9603.  Abdominopelvic CT 09/24/2022. FINDINGS: Mediastinal blood pool activity: SUV max 3.7 Liver activity: SUV max NA NECK: The eccentric right oropharyngeal mass is hypermetabolic. 4.1 x 3.4 cm and a S.U.V. max of 31.7 and 30/4. Right-sided level  2 node maintains its fatty hilum, but measures 8 mm and a S.U.V. max of 4.2 on 41/4. A left-sided level 2  node measures 5 mm and a S.U.V. max of 2.8 on 38/4. Incidental CT findings: Bilateral carotid atherosclerosis. Cerebral atrophy. CHEST: No areas of abnormal hypermetabolism. Incidental CT findings: Mild cardiomegaly. Aortic and coronary artery calcification. ABDOMEN/PELVIS: No abdominopelvic parenchymal or nodal hypermetabolism. Incidental CT findings: Mild caudate lobe prominence. Punctate right renal collecting system calculi. Abdominal aortic atherosclerosis. Radiation seeds in the prostate. The previously described periprosthetic contrast (related to space-oar gel) has nearly completely resolved. SKELETON: No abnormal marrow activity. Incidental CT findings: None. IMPRESSION: 1. Right-sided oropharyngeal primary. Bilateral small mildly hypermetabolic level 2 nodes, favoring metastasis over reactive etiology. 2. No extracervical hypermetabolic metastasis. 3. Incidental findings, including: Coronary artery atherosclerosis. Aortic Atherosclerosis (ICD10-I70.0). Right nephrolithiasis Electronically Signed   By: Jeronimo Greaves M.D.   On: 06/08/2023 09:28    Labs:  CBC: Recent Labs    09/24/22 1620 05/17/23 0930 06/14/23 1036 06/18/23 0943  WBC 5.0 5.4 4.8 4.3  HGB 12.6* 14.8 13.6 12.5*  HCT 38.4* 44.7 40.6 37.1*  PLT 221 220.0 192 212    COAGS: No results for input(s): "INR", "APTT" in the last 8760 hours.  BMP: Recent Labs    09/24/22 1620 05/17/23 0930 06/08/23 1137 06/14/23 0957  NA 138 140 143 139  K 3.2* 4.0 3.5 3.3*  CL 102 100 102 100  CO2 28 32 33* 31  GLUCOSE 106* 126* 115* 127*  BUN 24* 28* 32* 27*  CALCIUM 8.6* 9.2 9.3 9.2  CREATININE 0.93 0.83 0.76 0.67  GFRNONAA >60  --  >60 >60    LIVER FUNCTION TESTS: Recent Labs    09/24/22 1620 06/14/23 0957  BILITOT 0.8 0.8  AST 26 25  ALT 22 23  ALKPHOS 65 69  PROT 6.9 6.8  ALBUMIN 3.6 3.9    TUMOR MARKERS: No results for input(s): "AFPTM", "CEA", "CA199", "CHROMGRNA" in the last 8760 hours.  Assessment and  Plan: Patient with past medical history of HTN, HLD presents with complaint of recently diagnosed tonsillar cancer.  IR consulted for Port-A-Cath and gastrostomy tube placement at the request of Dr. Arlana Pouch. Case reviewed by Dr. Deanne Coffer who approves patient for procedure.  Patient presents today in their usual state of health.  He has been NPO and is not currently on blood thinners.   Risks and benefits of image guided port-a-catheter placement was discussed with the patient including, but not limited to bleeding, infection, pneumothorax, or fibrin sheath development and need for additional procedures.   Risks and benefits image guided gastrostomy tube placement was discussed with the patient including, but not limited to the need for a barium enema during the procedure, bleeding, infection, peritonitis and/or damage to adjacent structures.  All of the patient's questions were answered, patient is agreeable to proceed.  Consent signed and in chart.  Advance Care Plan: The advanced care plan/surrogate decision maker was discussed at the time of visit and documented in the medical record.     Thank you for this interesting consult.  I greatly enjoyed meeting Isaac Carpenter and look forward to participating in their care.  A copy of this report was sent to the requesting provider on this date.  Electronically Signed: Hoyt Koch, PA 06/18/2023, 10:40 AM   I spent a total of  30 Minutes   in face to face in clinical consultation, greater than 50% of which was counseling/coordinating care  for tonsillar cancer

## 2023-06-21 ENCOUNTER — Encounter: Payer: Self-pay | Admitting: Nurse Practitioner

## 2023-06-21 ENCOUNTER — Inpatient Hospital Stay (HOSPITAL_BASED_OUTPATIENT_CLINIC_OR_DEPARTMENT_OTHER): Admitting: Nurse Practitioner

## 2023-06-21 ENCOUNTER — Inpatient Hospital Stay: Payer: Medicare Other

## 2023-06-21 ENCOUNTER — Telehealth: Payer: Self-pay

## 2023-06-21 DIAGNOSIS — G893 Neoplasm related pain (acute) (chronic): Secondary | ICD-10-CM | POA: Insufficient documentation

## 2023-06-21 DIAGNOSIS — C61 Malignant neoplasm of prostate: Secondary | ICD-10-CM | POA: Insufficient documentation

## 2023-06-21 DIAGNOSIS — M546 Pain in thoracic spine: Secondary | ICD-10-CM | POA: Diagnosis not present

## 2023-06-21 DIAGNOSIS — Z5111 Encounter for antineoplastic chemotherapy: Secondary | ICD-10-CM | POA: Diagnosis not present

## 2023-06-21 DIAGNOSIS — Z87891 Personal history of nicotine dependence: Secondary | ICD-10-CM | POA: Insufficient documentation

## 2023-06-21 DIAGNOSIS — F419 Anxiety disorder, unspecified: Secondary | ICD-10-CM | POA: Insufficient documentation

## 2023-06-21 DIAGNOSIS — C090 Malignant neoplasm of tonsillar fossa: Secondary | ICD-10-CM | POA: Diagnosis not present

## 2023-06-21 DIAGNOSIS — Z931 Gastrostomy status: Secondary | ICD-10-CM | POA: Insufficient documentation

## 2023-06-21 DIAGNOSIS — Z510 Encounter for antineoplastic radiation therapy: Secondary | ICD-10-CM | POA: Insufficient documentation

## 2023-06-21 DIAGNOSIS — Z7982 Long term (current) use of aspirin: Secondary | ICD-10-CM | POA: Insufficient documentation

## 2023-06-21 DIAGNOSIS — Z95828 Presence of other vascular implants and grafts: Secondary | ICD-10-CM | POA: Insufficient documentation

## 2023-06-21 DIAGNOSIS — R1310 Dysphagia, unspecified: Secondary | ICD-10-CM | POA: Diagnosis not present

## 2023-06-21 DIAGNOSIS — Z79891 Long term (current) use of opiate analgesic: Secondary | ICD-10-CM | POA: Insufficient documentation

## 2023-06-21 DIAGNOSIS — Z79899 Other long term (current) drug therapy: Secondary | ICD-10-CM | POA: Insufficient documentation

## 2023-06-21 DIAGNOSIS — Z8546 Personal history of malignant neoplasm of prostate: Secondary | ICD-10-CM | POA: Insufficient documentation

## 2023-06-21 DIAGNOSIS — Z923 Personal history of irradiation: Secondary | ICD-10-CM | POA: Insufficient documentation

## 2023-06-21 MED ORDER — BACLOFEN 10 MG PO TABS: 5.0000 mg | ORAL_TABLET | Freq: Every evening | ORAL | 0 refills | Status: DC | PRN

## 2023-06-21 NOTE — Progress Notes (Signed)
 Patient Care Team: [name], [name], MD as PCP - General (Family Medicine) Cherlyn Cushing, RN as Oncology Nurse Navigator Ernesto Rutherford, MD as Referring Physician (Ophthalmology) Marcine Matar, MD as Consulting Physician (Urology) Margaretmary Dys, MD as Consulting Physician (Radiation Oncology) Maryclare Labrador, RN as Registered Nurse Causey, Larna Daughters, NP as Nurse Practitioner (Hematology and Oncology) Lonie Peak, MD as Attending Physician (Radiation Oncology) Ashok Croon, MD as Consulting Physician (Otolaryngology)   CHIEF COMPLAINT: Symptom management for back pain following port and feeding tube placement   CURRENT THERAPY: Pending chemo RT for head and neck cancer  INTERVAL HISTORY Isaac Carpenter presents as a symptom management visit following chemo education.  He had port and G-tube placed 2/28, he woke up with significant pain between his shoulders.  Pain is worse with movement, no pain when still/at rest.  The discomfort kept him from sleeping well or doing much movement over the weekend but is getting better with time.  Taking oxycodone 5 mg every 5 hours but is not helping much.  He did not have this pain prior to the procedure.  Denies associated cough, chest pain, dyspnea, neck swelling.  ROS  All other systems reviewed and negative  Past Medical History:  Diagnosis Date   Arthritis    Cancer (HCC)    History of partial thyroidectomy STATES OVERACTIVE THYROID-- NO ISSUES SINCE AGE 89 AND NO MEDS   Hyperlipidemia    Hypertension    Hypothyroidism    Sleep apnea    Per patient he tried CPAP but could not tolerate.   Thyroid disease      Past Surgical History:  Procedure Laterality Date   CIRCUMCISION  01/04/2012   Procedure: CIRCUMCISION ADULT;  Surgeon: Kathi Ludwig, MD;  Location: [facility];  Service: Urology;  Laterality: N/A;   COLONOSCOPY  04/08/2020   Vito Cirigliano   CYSTOSCOPY  09/04/2022   Procedure: CYSTOSCOPY;   Surgeon: Marcine Matar, MD;  Location: [facility];  Service: Urology;;   IR GASTROSTOMY TUBE MOD SED  06/18/2023   IR IMAGING GUIDED PORT INSERTION  06/18/2023   RADIOACTIVE SEED IMPLANT N/A 09/04/2022   Procedure: RADIOACTIVE SEED IMPLANT/BRACHYTHERAPY IMPLANT;  Surgeon: Marcine Matar, MD;  Location: [facility];  Service: Urology;  Laterality: N/A;  90 MINS   SPACE OAR INSTILLATION N/A 09/04/2022   Procedure: SPACE OAR INSTILLATION;  Surgeon: Marcine Matar, MD;  Location: [facility];  Service: Urology;  Laterality: N/A;   THYROIDECTOMY, PARTIAL  AGE 89   OVERACTIVE THYROID     Outpatient Encounter Medications as of 06/21/2023  Medication Sig   baclofen (LIORESAL) 10 MG tablet Take 0.5-1 tablets (5-10 mg total) by mouth at bedtime as needed for muscle spasms.   acetaminophen (TYLENOL) 500 MG tablet Take 1 tablet (500 mg total) by mouth every 6 (six) hours. Please take every 6 hrs and stagger with Motrin 3 hrs apart from each other. Please do not exceed maximum daily dose to avoid liver damage   aspirin EC 81 MG tablet Take 81 mg by mouth daily. Swallow whole.   atorvastatin (LIPITOR) 20 MG tablet Take 1 tablet (20 mg total) by mouth daily.   dexamethasone (DECADRON) 4 MG tablet Take 2 tablets (8 mg) by mouth daily x 3 days starting the day after cisplatin chemotherapy. Take with food.   gabapentin (NEURONTIN) 100 MG capsule Take 1 capsule (100 mg total) by mouth 3 (three) times daily. (Patient not taking: Reported  on 06/10/2023)   ibuprofen (ADVIL) 600 MG tablet Take 1 tablet (600 mg total) by mouth every 6 (six) hours. Please take every 6 hrs, and stagger this medication 3 hrs apart from Tylenol   levothyroxine (SYNTHROID) 88 MCG tablet TAKE ONE TABLET BY MOUTH DAILY.   lidocaine (XYLOCAINE) 2 % solution Use as directed 15 mLs in the mouth or throat every 4 (four) hours as needed for mouth pain.   lidocaine-prilocaine (EMLA) cream Apply  to affected area once   lisinopril-hydrochlorothiazide (ZESTORETIC) 20-25 MG tablet Take 1 tablet by mouth daily.   magic mouthwash SOLN TAKE BY MOUTH THREE TIMES DAILY AS NEEDED FOR UP TO 10 DAYS FOR MOUTH PAIN (Patient not taking: Reported on 06/10/2023)   ondansetron (ZOFRAN) 8 MG tablet Take 1 tablet (8 mg total) by mouth every 8 (eight) hours as needed for nausea or vomiting. Start on the third day after cisplatin.   oxyCODONE 10 MG TABS Take 1 tablet (10 mg total) by mouth every 6 (six) hours as needed for severe pain (pain score 7-10). Use 1 tab Q4hrs for pain and if continue to have severe pain ok to take 2 tabs Q4hrs   prochlorperazine (COMPAZINE) 10 MG tablet Take 1 tablet (10 mg total) by mouth every 6 (six) hours as needed (Nausea or vomiting).   solifenacin (VESICARE) 5 MG tablet Take 5 mg by mouth daily.   tamsulosin (FLOMAX) 0.4 MG CAPS capsule Take 1 capsule (0.4 mg total) by mouth 2 (two) times daily after a meal.   No facility-administered encounter medications on file as of 06/21/2023.     There were no vitals filed for this visit. There is no height or weight on file to calculate BMI.   PHYSICAL EXAM GENERAL:alert, no distress and comfortable SKIN: no rash  EYES: sclera clear NECK: without mass or swelling.  No JVD or venous engorgement LUNGS: clear with normal breathing effort HEART: regular rate & rhythm, no lower extremity edema ABDOMEN: abdomen soft, non-tender and normal bowel sounds.  G-tube in place, dressing C/D/I NEURO: alert & oriented x 3 with fluent speech, no focal motor/sensory deficits MSK: ttp at low cervical/upper thoracic spine PAC: Right chest port incisions closed with Dermabond, significant localized ecchymosis.  No edema  LAB DATA No labs for this visit    ASSESSMENT & PLAN: 73 yo male  Upper back pain -Onset upon waking from port placement and G tube placement 2/28, per pt the Gtube placement was a painful procedure  -No pain prior, no  associated red flags. Exam is benign except localized ttp in the upper back.  -He has tried oxycodone from a previous prescription. I recommend to take tylenol q6h and NSAID q6h and alternate/stagger so he takes something q3h, plus heat x3-5 days -I will give him low dose baclofen at bedtime PRN. He knows not to drive and to avoid taking with opioids, and to stop when no longer needed -He knows to call if symptom worsen or fail to improve  H/N cancer of Tonsillar Fossa, cT2N2M0, stage II P16+ -Pending ccRT with weekly cisplatin -Chemo ed rescheduled today due to back pain, now scheduled 3/5   PLAN: -Symptom management for back pain (alternating tylenol/NSAID with heat, and muscle relaxer PRN at night) -Rx: Baclofen 5 - 10 mg at bedtime PRN -Knows to call sooner if symptoms worsen or fail to improve -Rescheduled chemo ed to 3/5, will also see dentist that day -Swallow study and speech eval 3/10 and follow-up Dr. Arlana Pouch  3/13 to begin treatment as scheduled    All questions were answered. The patient knows to call the clinic with any problems, questions or concerns. No barriers to learning were detected. I spent 20 minutes counseling the patient face to face. The total time spent in the appointment was 30 minutes and more than 50% was on counseling, review of test results, and coordination of care.   Isaac Glad, NP-C 06/21/2023

## 2023-06-21 NOTE — Telephone Encounter (Signed)
 Mr. Paff arrived to class this am in pain from Port-a-cath and G-Tube placement Friday 06-18-23. His pain is in his back across his shoulders. Pain is a 9/10. He has had this pain intermittently since leaving the hospital Friday. The pain progressively gets worse with movement. If hs sits still the pain dissipates. His wife gave him an oxycodone 5 mg tab at 0845. Pain med after an hour did not help when he got up to walk to see Santiago Glad NP. regarding pain. No SOB or chest pain with  shoulder pain. Pt also saw [facility] to review dressing change and flushing of G-tube.  Education session r/s to 06-23-23 at 2 pm.

## 2023-06-22 ENCOUNTER — Other Ambulatory Visit: Payer: Self-pay | Admitting: Oncology

## 2023-06-23 ENCOUNTER — Inpatient Hospital Stay

## 2023-06-23 ENCOUNTER — Inpatient Hospital Stay: Payer: Medicare Other

## 2023-06-23 ENCOUNTER — Telehealth: Payer: Self-pay | Admitting: Oncology

## 2023-06-23 DIAGNOSIS — H905 Unspecified sensorineural hearing loss: Secondary | ICD-10-CM | POA: Insufficient documentation

## 2023-06-23 NOTE — Telephone Encounter (Signed)
 Rescheduled patient's appointment.

## 2023-06-24 ENCOUNTER — Other Ambulatory Visit: Payer: Medicare Other

## 2023-06-24 ENCOUNTER — Other Ambulatory Visit: Payer: Self-pay

## 2023-06-24 ENCOUNTER — Ambulatory Visit (INDEPENDENT_AMBULATORY_CARE_PROVIDER_SITE_OTHER): Payer: Medicare Other | Admitting: Otolaryngology

## 2023-06-24 ENCOUNTER — Ambulatory Visit: Payer: Medicare Other | Admitting: Oncology

## 2023-06-25 ENCOUNTER — Ambulatory Visit: Payer: Medicare Other

## 2023-06-25 ENCOUNTER — Inpatient Hospital Stay: Payer: Medicare Other

## 2023-06-25 ENCOUNTER — Other Ambulatory Visit: Payer: Self-pay

## 2023-06-25 DIAGNOSIS — C090 Malignant neoplasm of tonsillar fossa: Secondary | ICD-10-CM | POA: Diagnosis not present

## 2023-06-28 ENCOUNTER — Ambulatory Visit (HOSPITAL_COMMUNITY)
Admission: RE | Admit: 2023-06-28 | Discharge: 2023-06-28 | Disposition: A | Discharge status: Home / Self Care | Payer: Medicare Other | Source: Ambulatory Visit | Attending: Family Medicine

## 2023-06-28 ENCOUNTER — Inpatient Hospital Stay

## 2023-06-28 ENCOUNTER — Telehealth: Payer: Self-pay

## 2023-06-28 ENCOUNTER — Inpatient Hospital Stay: Payer: Medicare Other

## 2023-06-28 ENCOUNTER — Ambulatory Visit (HOSPITAL_COMMUNITY)
Admission: RE | Admit: 2023-06-28 | Discharge: 2023-06-28 | Disposition: A | Discharge status: Home / Self Care | Payer: Medicare Other | Source: Ambulatory Visit | Attending: Family Medicine | Admitting: Family Medicine

## 2023-06-28 DIAGNOSIS — R1310 Dysphagia, unspecified: Secondary | ICD-10-CM | POA: Diagnosis not present

## 2023-06-28 DIAGNOSIS — R1312 Dysphagia, oropharyngeal phase: Secondary | ICD-10-CM | POA: Diagnosis not present

## 2023-06-28 DIAGNOSIS — C099 Malignant neoplasm of tonsil, unspecified: Secondary | ICD-10-CM | POA: Insufficient documentation

## 2023-06-28 DIAGNOSIS — R059 Cough, unspecified: Secondary | ICD-10-CM | POA: Diagnosis not present

## 2023-06-28 DIAGNOSIS — C090 Malignant neoplasm of tonsillar fossa: Secondary | ICD-10-CM

## 2023-06-28 NOTE — Therapy (Signed)
 Modified Barium Swallow Study  Patient Details  Name: Isaac Carpenter MRN: [id number] Date of Birth: [birth year]  Today's Date: 06/28/2023  Modified Barium Swallow completed.  Full report located under Chart Review in the Imaging Section.  History of Present Illness Thurman Sarver is a 73 y.o. male with PMH: HLD, HTN, OSA, recently diagnosed with stage II SCC of right tonsil with plans for concurrent chemoradiation. He had PEG placed in IR on 06/18/23. He presented to this OP MBS for evaluation of baseline swallow function and to determine aspiration risk. Patient reported pain with swallowing since bipsy of oropharyngeal mass on 05/27/23, however it has improved since then and he is able to eat and drink.   Clinical Impression Patient presents with an oropharyngeal swallow that is largely [facility]. No penetration or aspiration occured during any phase of the swallow with any of the tested liquid or solid consistencies. Anterior hyoid excursion appeared partial in completion but patient with full epiglottic inversion and laryngeal vestibule closure. With solids, he did exhibit increased amount of vallecular residuals but with clearance with subsequent swallows and sips of liquids. PES opening appeared [facility] and no retrograde flow of barium observed in upper esophagus. Trace amount of barium residue remained in oropharynx at level of known mass, however barium fully cleared with subsequent swallows. Barium tablet transit appeared to stall in distal esophagus.   SLP recommends referral to OP SLP secondary to planned oropharyngeal chemoradiation treatment.   Swallow Evaluation Recommendations Recommendations: PO diet PO Diet Recommendation: Regular;Thin liquids (Level 0) Liquid Administration via: Cup;Straw Medication Administration: Whole meds with liquid Supervision: Patient able to self-feed Swallowing strategies  : Follow solids with liquids Postural changes: Position pt fully upright for meals Oral  care recommendations: Oral care BID (2x/day)      Angela Nevin, MA, CCC-SLP Speech Therapy

## 2023-06-28 NOTE — Telephone Encounter (Signed)
 Reviewed hand out for 5 8 oz of fluid the evening prior to and the morning of Cisplatin. Pt understands the direction, however he was very emphatic about never taking in that much fluid a day. He can go 3 days with out fluid. Suggested that he let [facility], Dr. Arlana Pouch, and Dr. Basilio Cairo aware of this as need may need IVF prior to chemo day.

## 2023-06-29 ENCOUNTER — Other Ambulatory Visit: Payer: Self-pay

## 2023-06-30 ENCOUNTER — Inpatient Hospital Stay: Payer: Medicare Other

## 2023-07-01 ENCOUNTER — Ambulatory Visit: Payer: Medicare Other | Admitting: Oncology

## 2023-07-01 ENCOUNTER — Other Ambulatory Visit: Payer: Medicare Other

## 2023-07-02 ENCOUNTER — Inpatient Hospital Stay: Payer: Medicare Other

## 2023-07-02 ENCOUNTER — Inpatient Hospital Stay: Payer: Medicare Other | Admitting: Dietician

## 2023-07-02 ENCOUNTER — Telehealth: Payer: Self-pay | Admitting: Dietician

## 2023-07-02 ENCOUNTER — Ambulatory Visit: Payer: Medicare Other

## 2023-07-02 NOTE — Telephone Encounter (Signed)
 Nutrition Follow-up:  Pt with stage II SCC of right tonsil, p16 positive. He is planning to receive concurrent chemoradiation (first chemo 3/21). Patient is under the care of Dr. Basilio Cairo and Dr. Arlana Pouch.   S/p Gtube 2/28  Spoke with wife of pt via telephone. She reports patient has been eating pretty good. Had a few days with a sore throat, but overall this has improved. Endorses a good appetite. Wife reports they are currently out at breakfast. Patient has lingering abdominal soreness from PEG placement. Wife is providing PEG care. Reports some drainage that is mostly clear and a "little goopy." She reports having a hard time getting the screw top off and on of the tube for water flushes. They were unable to get this unscrewed last night. Planning to try again this afternoon after his shower. Pt does not have a clamp.   Medications: reviewed   Labs: no new labs  Anthropometrics: No new wts to trend   NUTRITION DIAGNOSIS: Unintended wt loss - suspect ongoing   INTERVENTION:  Continue eating orally as tolerated. Encourage soft moist textures for ease of intake  Reviewed PEG care Suggested using a pair of pliers for grip when unscrewing tube - will have nurse navigator evaluate tube 3/17   MONITORING, EVALUATION, GOAL: wt trends, intake   NEXT VISIT: Friday March 21 during infusion

## 2023-07-05 ENCOUNTER — Inpatient Hospital Stay: Payer: Medicare Other

## 2023-07-05 ENCOUNTER — Ambulatory Visit
Admission: RE | Admit: 2023-07-05 | Discharge: 2023-07-05 | Disposition: A | Discharge status: Home / Self Care | Source: Ambulatory Visit | Attending: Radiation Oncology | Admitting: Radiation Oncology

## 2023-07-05 ENCOUNTER — Ambulatory Visit
Admission: RE | Admit: 2023-07-05 | Discharge: 2023-07-05 | Disposition: A | Discharge status: Home / Self Care | Source: Ambulatory Visit | Attending: Radiation Oncology

## 2023-07-05 VITALS — BP 131/66 | HR 49 | Temp 97.7°F | Resp 18 | Ht 73.0 in | Wt 280.0 lb

## 2023-07-05 DIAGNOSIS — Z79891 Long term (current) use of opiate analgesic: Secondary | ICD-10-CM | POA: Diagnosis not present

## 2023-07-05 DIAGNOSIS — Z931 Gastrostomy status: Secondary | ICD-10-CM | POA: Diagnosis not present

## 2023-07-05 DIAGNOSIS — Z510 Encounter for antineoplastic radiation therapy: Secondary | ICD-10-CM | POA: Insufficient documentation

## 2023-07-05 DIAGNOSIS — C090 Malignant neoplasm of tonsillar fossa: Secondary | ICD-10-CM | POA: Insufficient documentation

## 2023-07-05 DIAGNOSIS — R1310 Dysphagia, unspecified: Secondary | ICD-10-CM | POA: Diagnosis not present

## 2023-07-05 DIAGNOSIS — Z95828 Presence of other vascular implants and grafts: Secondary | ICD-10-CM | POA: Insufficient documentation

## 2023-07-05 DIAGNOSIS — Z79899 Other long term (current) drug therapy: Secondary | ICD-10-CM | POA: Diagnosis not present

## 2023-07-05 DIAGNOSIS — Z923 Personal history of irradiation: Secondary | ICD-10-CM | POA: Diagnosis not present

## 2023-07-05 DIAGNOSIS — M546 Pain in thoracic spine: Secondary | ICD-10-CM | POA: Diagnosis not present

## 2023-07-05 DIAGNOSIS — Z5111 Encounter for antineoplastic chemotherapy: Secondary | ICD-10-CM | POA: Diagnosis not present

## 2023-07-05 DIAGNOSIS — Z87891 Personal history of nicotine dependence: Secondary | ICD-10-CM | POA: Diagnosis not present

## 2023-07-05 DIAGNOSIS — G893 Neoplasm related pain (acute) (chronic): Secondary | ICD-10-CM | POA: Diagnosis not present

## 2023-07-05 DIAGNOSIS — Z7982 Long term (current) use of aspirin: Secondary | ICD-10-CM | POA: Diagnosis not present

## 2023-07-05 MED ORDER — SODIUM CHLORIDE 0.9% FLUSH
10.0000 mL | Freq: Once | INTRAVENOUS | Status: AC
  Administered 2023-07-05: 10 mL via INTRAVENOUS

## 2023-07-05 MED ORDER — HEPARIN SOD (PORK) LOCK FLUSH 100 UNIT/ML IV SOLN
500.0000 [IU] | Freq: Once | INTRAVENOUS | Status: AC
  Administered 2023-07-05: 500 [IU] via INTRAVENOUS

## 2023-07-05 NOTE — Progress Notes (Signed)
 Has armband been applied?  Yes.    Does patient have an allergy to IV contrast dye?: No.   Has patient ever received premedication for IV contrast dye?: No.   Date of lab work: June 14, 2023 BUN: 27 CR: 0.67 eGFR: >60  Does patient take metformin?: No.  IV site: Right port a cath    Has IV site been added to flowsheet?  Yes.     BP 131/66   Pulse (!) 49   Temp 97.7 F (36.5 C)   Resp 18   Ht 6\' 1"  (1.854 m)   Wt 280 lb (127 kg)   SpO2 97%   BMI 36.94 kg/m

## 2023-07-05 NOTE — Progress Notes (Signed)
 Oncology Nurse Navigator Documentation   To provide support, encouragement and care continuity, met with Isaac Carpenter before and after his CT SIM. He was accompanied by his wife. They are having difficulty with not being able to unscrew the cap to his feeding tube to flush. Today I was able to unscrew both caps and flush his tube. I provided them with a few gloves to hopefully provide a better grip while removing the caps for flushing. I also changed the dressing to the site due to brown/yellow drainage present. I advised them to change the dressing 2 times daily to avoid excess drainage irritating the feeding tube site. [name] and his wife know to call me if they have any questions or concerns before his start date on 3/21.  He tolerated procedure without difficulty, denied questions/concerns.   I encouraged him to call me prior to his radiation start on 07/12/23 or his chemotherapy start on 07/09/23.   Hedda Slade RN, BSN, OCN Head & Neck Oncology Nurse Navigator [facility] Phone # [phone]  Fax # [phone]

## 2023-07-07 ENCOUNTER — Inpatient Hospital Stay: Payer: Medicare Other

## 2023-07-08 MED FILL — Fosaprepitant Dimeglumine For IV Infusion 150 MG (Base Eq): INTRAVENOUS | Qty: 5 | Status: AC

## 2023-07-09 ENCOUNTER — Inpatient Hospital Stay: Payer: Medicare Other

## 2023-07-09 ENCOUNTER — Inpatient Hospital Stay

## 2023-07-09 ENCOUNTER — Other Ambulatory Visit: Payer: Self-pay | Admitting: Oncology

## 2023-07-09 ENCOUNTER — Telehealth: Payer: Self-pay

## 2023-07-09 ENCOUNTER — Encounter: Payer: Self-pay | Admitting: Oncology

## 2023-07-09 ENCOUNTER — Inpatient Hospital Stay: Payer: Medicare Other | Admitting: Oncology

## 2023-07-09 ENCOUNTER — Inpatient Hospital Stay: Payer: Medicare Other | Admitting: Dietician

## 2023-07-09 VITALS — BP 121/70 | HR 56 | Temp 97.6°F | Resp 16 | Wt 282.0 lb

## 2023-07-09 VITALS — BP 140/70 | HR 66 | Resp 16

## 2023-07-09 DIAGNOSIS — G893 Neoplasm related pain (acute) (chronic): Secondary | ICD-10-CM | POA: Diagnosis not present

## 2023-07-09 DIAGNOSIS — F418 Other specified anxiety disorders: Secondary | ICD-10-CM | POA: Insufficient documentation

## 2023-07-09 DIAGNOSIS — Z7982 Long term (current) use of aspirin: Secondary | ICD-10-CM | POA: Diagnosis not present

## 2023-07-09 DIAGNOSIS — Z87891 Personal history of nicotine dependence: Secondary | ICD-10-CM | POA: Diagnosis not present

## 2023-07-09 DIAGNOSIS — Z95828 Presence of other vascular implants and grafts: Secondary | ICD-10-CM | POA: Diagnosis not present

## 2023-07-09 DIAGNOSIS — C090 Malignant neoplasm of tonsillar fossa: Secondary | ICD-10-CM

## 2023-07-09 DIAGNOSIS — Z931 Gastrostomy status: Secondary | ICD-10-CM | POA: Diagnosis not present

## 2023-07-09 DIAGNOSIS — R1310 Dysphagia, unspecified: Secondary | ICD-10-CM | POA: Diagnosis not present

## 2023-07-09 DIAGNOSIS — Z79891 Long term (current) use of opiate analgesic: Secondary | ICD-10-CM | POA: Diagnosis not present

## 2023-07-09 DIAGNOSIS — Z79899 Other long term (current) drug therapy: Secondary | ICD-10-CM | POA: Diagnosis not present

## 2023-07-09 DIAGNOSIS — M546 Pain in thoracic spine: Secondary | ICD-10-CM | POA: Diagnosis not present

## 2023-07-09 DIAGNOSIS — Z510 Encounter for antineoplastic radiation therapy: Secondary | ICD-10-CM | POA: Diagnosis not present

## 2023-07-09 DIAGNOSIS — N401 Enlarged prostate with lower urinary tract symptoms: Secondary | ICD-10-CM

## 2023-07-09 DIAGNOSIS — Z5111 Encounter for antineoplastic chemotherapy: Secondary | ICD-10-CM

## 2023-07-09 DIAGNOSIS — Z923 Personal history of irradiation: Secondary | ICD-10-CM | POA: Diagnosis not present

## 2023-07-09 LAB — CBC WITH DIFFERENTIAL (CANCER CENTER ONLY)
Abs Immature Granulocytes: 0.01 10*3/uL (ref 0.00–0.07)
Basophils Absolute: 0 10*3/uL (ref 0.0–0.1)
Basophils Relative: 1 %
Eosinophils Absolute: 0.5 10*3/uL (ref 0.0–0.5)
Eosinophils Relative: 9 %
HCT: 39.5 % (ref 39.0–52.0)
Hemoglobin: 13.1 g/dL (ref 13.0–17.0)
Immature Granulocytes: 0 %
Lymphocytes Relative: 23 %
Lymphs Abs: 1.3 10*3/uL (ref 0.7–4.0)
MCH: 30.7 pg (ref 26.0–34.0)
MCHC: 33.2 g/dL (ref 30.0–36.0)
MCV: 92.5 fL (ref 80.0–100.0)
Monocytes Absolute: 0.5 10*3/uL (ref 0.1–1.0)
Monocytes Relative: 9 %
Neutro Abs: 3.3 10*3/uL (ref 1.7–7.7)
Neutrophils Relative %: 58 %
Platelet Count: 220 10*3/uL (ref 150–400)
RBC: 4.27 MIL/uL (ref 4.22–5.81)
RDW: 13.7 % (ref 11.5–15.5)
WBC Count: 5.5 10*3/uL (ref 4.0–10.5)
nRBC: 0 % (ref 0.0–0.2)

## 2023-07-09 LAB — BASIC METABOLIC PANEL - CANCER CENTER ONLY
Anion gap: 5 (ref 5–15)
BUN: 17 mg/dL (ref 8–23)
CO2: 33 mmol/L — ABNORMAL HIGH (ref 22–32)
Calcium: 8.8 mg/dL — ABNORMAL LOW (ref 8.9–10.3)
Chloride: 101 mmol/L (ref 98–111)
Creatinine: 0.63 mg/dL (ref 0.61–1.24)
GFR, Estimated: >60 mL/min (ref >60)
Glucose, Bld: 134 mg/dL — ABNORMAL HIGH (ref 70–99)
Potassium: 3.5 mmol/L (ref 3.5–5.1)
Sodium: 139 mmol/L (ref 135–145)

## 2023-07-09 LAB — MAGNESIUM: Magnesium: 1.9 mg/dL (ref 1.7–2.4)

## 2023-07-09 MED ORDER — DOXAZOSIN MESYLATE 1 MG PO TABS: 1.0000 mg | ORAL_TABLET | Freq: Every day | ORAL | 1 refills | Status: DC

## 2023-07-09 MED ORDER — SODIUM CHLORIDE 0.9 % IV SOLN
150.0000 mg | Freq: Once | INTRAVENOUS | Status: AC
  Administered 2023-07-09: 150 mg via INTRAVENOUS
  Filled 2023-07-09: qty 150

## 2023-07-09 MED ORDER — HEPARIN SOD (PORK) LOCK FLUSH 100 UNIT/ML IV SOLN
500.0000 [IU] | Freq: Once | INTRAVENOUS | Status: AC | PRN
  Administered 2023-07-09: 500 [IU]

## 2023-07-09 MED ORDER — PALONOSETRON HCL INJECTION 0.25 MG/5ML
0.2500 mg | Freq: Once | INTRAVENOUS | Status: AC
  Administered 2023-07-09: 0.25 mg via INTRAVENOUS
  Filled 2023-07-09: qty 5

## 2023-07-09 MED ORDER — DEXAMETHASONE SODIUM PHOSPHATE 10 MG/ML IJ SOLN
10.0000 mg | Freq: Once | INTRAMUSCULAR | Status: AC
  Administered 2023-07-09: 10 mg via INTRAVENOUS
  Filled 2023-07-09: qty 1

## 2023-07-09 MED ORDER — POTASSIUM CHLORIDE IN NACL 20-0.9 MEQ/L-% IV SOLN
Freq: Once | INTRAVENOUS | Status: AC
  Filled 2023-07-09: qty 1000

## 2023-07-09 MED ORDER — CISPLATIN CHEMO INJECTION 100MG/100ML
40.0000 mg/m2 | Freq: Once | INTRAVENOUS | Status: AC
  Administered 2023-07-09: 100 mg via INTRAVENOUS
  Filled 2023-07-09: qty 100

## 2023-07-09 MED ORDER — NUTREN 1.5 EN LIQD: ENTERAL | Status: DC

## 2023-07-09 MED ORDER — ALPRAZOLAM 1 MG PO TABS: 0.5000 mg | ORAL_TABLET | Freq: Two times a day (BID) | ORAL | 0 refills | Status: DC | PRN

## 2023-07-09 MED ORDER — SODIUM CHLORIDE 0.9% FLUSH
10.0000 mL | INTRAVENOUS | Status: DC | PRN
  Administered 2023-07-09: 10 mL

## 2023-07-09 MED ORDER — SODIUM CHLORIDE 0.9 % IV SOLN: INTRAVENOUS | Status: DC

## 2023-07-09 MED ORDER — MAGNESIUM SULFATE 2 GM/50ML IV SOLN
2.0000 g | Freq: Once | INTRAVENOUS | Status: AC
  Administered 2023-07-09: 2 g via INTRAVENOUS
  Filled 2023-07-09: qty 50

## 2023-07-09 MED ORDER — SODIUM CHLORIDE 0.9% FLUSH
10.0000 mL | Freq: Once | INTRAVENOUS | Status: AC
  Administered 2023-07-09: 10 mL

## 2023-07-09 NOTE — Progress Notes (Signed)
 Nutrition Follow-up:  Pt with stage II SCC of right tonsil, p16 positive. He is planning to receive concurrent chemoradiation (first chemo 3/21). First RT planned 3/24. Patient is under the care of Dr. Basilio Cairo and Dr. Arlana Pouch.   S/p Gtube 2/28   Met with pt and wife in infusion. He reports worsening throat pain and burning in the last 2-3 days. Pt attempting to eat Chick fila nuggets at visit. Odynophagia is observed as pt is tearful when trying to swallow. Lidocaine rinse taste horrible and does not last long enough when eating. States hydrocodone has not been beneficial for throat pain. Wife has been encouraging soft moist foods. Recalls eggs/grits and sausage link for breakfast. Had northern beans, rice, summer sausage for dinner. He has had a couple of smoothies. Pt has not tried nutrition supplement samples. Wife is concerned pt is going to have difficulty swallowing pills. He is flushing tube with ~1/4 cup water. Pt has done baking soda salt water rinse a couple of times. Pt is agreeable to bolus feeding education today.    Medications: xanax (3/21)  Labs: glucose 134  Anthropometrics: Wt 282 lb today (wearing heavy jacket per wife)  3/17 - 280 lb 2/26 - 280 lb 12 oz   Estimated Energy Needs  Kcals: 2800-3180 Protein: 140-153 Fluid: >/=2.8 L  NUTRITION DIAGNOSIS: Unintended wt loss     INTERVENTION:  Infusion RN sent message to provider + pharmacy (medications that can be crushed, pain management) Reviewed soft moist high protein foods, recommend avoiding fried/sharp edged foods Encouraged pt to try ONS, recommend 2/day as tolerated  Bolus education provided - wife gave 60 ml water before/after formula. Pt tolerated half carton Osmolite 1.5  Encourage 1-2 cartons Osmolite 1.5/day in between meals. Increase by one carton/day to goal as tolerated  Goal: Anticipate long term enteral needs 2 cartons Nutren 1.5 (500 ml) QID. Flush with 60 ml water before and after bolus. Give 250 ml  free water QID to meet hydration needs. Provides 2000 ml/day, 3000 kcal, 136 g protein, 3008 ml total water. Meets 100% DRI    MONITORING, EVALUATION, GOAL: wt trends, intake, TF   NEXT VISIT: Friday March 28 during infusion

## 2023-07-09 NOTE — Assessment & Plan Note (Signed)
 Experiencing significant anxiety with symptoms of racing thoughts. No prior history of anxiolytic use. Alprazolam prescribed for short-term relief, with evaluation for long-acting medication if symptoms persist. - Prescribe alprazolam for short-term anxiety relief, to be taken up to twice a day as needed - Evaluate anxiety levels next week to consider long-acting anxiolytic if needed

## 2023-07-09 NOTE — Assessment & Plan Note (Signed)
 Please review oncology history for additional details and timeline of events.  Symptoms include severe pain and dysphagia since mid-November, progressively worsening.   cT3,cN2,cM0,p16+ tumor, Stage II disease.   His case was discussed in tumor conference on 06/09/2023.  Given the extent of disease, consensus opinion is to proceed with concurrent chemoradiation.  He already had consultation with Dr. Basilio Cairo.  He is scheduled to begin radiation treatments from 07/12/2023.  We have discussed about role of cisplatin being a radiosensitizer in the treatment of head and neck cancer.  We have discussed about the curative intent of chemoradiation for this patient.     We have discussed about mechanism of action of cisplatin, adverse effects of cisplatin including but not limited to fatigue, nausea, vomiting, increased risk of infections, mucositis, ototoxicity, nephrotoxicity, peripheral neuropathy.  Patient understands that some of the side effects can be permanent and even potentially fatal.  We have discussed about role of Mediport and G-tube for chemotherapy administration and nutrition respectively since most of these patients have severe mucositis during the treatment.  At this time we do not know if weekly cisplatin is inferior to every 21 days cisplatin since there is no head-to-head comparison trial.  I did mention to the patient however weekly cisplatin is well-tolerated with less adverse effects and most of the patients tend to complete treatment as planned.  Patient is willing to proceed with weekly cisplatin.  Patient completed chemo education.  He already had Port-A-Cath placed and feeding tube placed.  Labs today reveal unremarkable CBCD, BMP, mag.  Plan is to proceed with cycle 1 of cisplatin today at a dose of 40 mg/m.  Cisplatin will be continued weekly during the course of radiation.  - Ensure home hydration (80-90 oz non-caffeinated fluids daily) - Monitor and replace electrolytes as  needed  RTC in 1 week for labs, follow-up and continuation of chemotherapy.

## 2023-07-09 NOTE — Progress Notes (Signed)
 [facility]  ONCOLOGY CLINIC PROGRESS NOTE   Patient Care Team: [name], [name], MD as PCP - General (Family Medicine) Cherlyn Cushing, RN as Oncology Nurse Navigator Ernesto Rutherford, MD as Referring Physician (Ophthalmology) Marcine Matar, MD as Consulting Physician (Urology) Margaretmary Dys, MD as Consulting Physician (Radiation Oncology) Maryclare Labrador, RN as Registered Nurse Axel Filler, Larna Daughters, NP as Nurse Practitioner (Hematology and Oncology) Lonie Peak, MD as Attending Physician (Radiation Oncology) Ashok Croon, MD as Consulting Physician (Otolaryngology) Malmfelt, Lise Auer, RN as Oncology Nurse Navigator Meryl Crutch, MD as Consulting Physician (Oncology)  PATIENT NAME: Isaac Carpenter   MR#: [id number] DOB: 1950-06-16  Date of visit: 07/09/2023   ASSESSMENT & PLAN:   Isaac Carpenter is a 73 y.o. with a past medical history of prostate cancer diagnosed in March 2024, S/P radioactive seed implant/brachytherapy, hypertension, dyslipidemia, hypothyroidism, obstructive sleep apnea. He presented for follow up of recently diagnosed squamous cell carcinoma of the right tonsil, clinical stage II disease (cT3,cN2,cM0,p16+).   Malignant neoplasm of tonsillar fossa (HCC) Please review oncology history for additional details and timeline of events.  Symptoms include severe pain and dysphagia since mid-November, progressively worsening.   cT3,cN2,cM0,p16+ tumor, Stage II disease.   His case was discussed in tumor conference on 06/09/2023.  Given the extent of disease, consensus opinion is to proceed with concurrent chemoradiation.  He already had consultation with Dr. Basilio Cairo.  He is scheduled to begin radiation treatments from 07/12/2023.  We have discussed about role of cisplatin being a radiosensitizer in the treatment of head and neck cancer.  We have discussed about the curative intent of chemoradiation for this patient.     We have discussed about  mechanism of action of cisplatin, adverse effects of cisplatin including but not limited to fatigue, nausea, vomiting, increased risk of infections, mucositis, ototoxicity, nephrotoxicity, peripheral neuropathy.  Patient understands that some of the side effects can be permanent and even potentially fatal.  We have discussed about role of Mediport and G-tube for chemotherapy administration and nutrition respectively since most of these patients have severe mucositis during the treatment.  At this time we do not know if weekly cisplatin is inferior to every 21 days cisplatin since there is no head-to-head comparison trial.  I did mention to the patient however weekly cisplatin is well-tolerated with less adverse effects and most of the patients tend to complete treatment as planned.  Patient is willing to proceed with weekly cisplatin.  Patient completed chemo education.  He already had Port-A-Cath placed and feeding tube placed.  Labs today reveal unremarkable CBCD, BMP, mag.  Plan is to proceed with cycle 1 of cisplatin today at a dose of 40 mg/m.  Cisplatin will be continued weekly during the course of radiation.  - Ensure home hydration (80-90 oz non-caffeinated fluids daily) - Monitor and replace electrolytes as needed  RTC in 1 week for labs, follow-up and continuation of chemotherapy.  Dysphagia He has been having dysphagia.  Encouraged him to use feeding tube for adequate hydration and nutrition.  Continue to eat and drink as much as he can through the mouth to preserve muscle function.  We advised him to crush or dissolve tablets and take them via feeding tube, where needed.  Situational anxiety Experiencing significant anxiety with symptoms of racing thoughts. No prior history of anxiolytic use. Alprazolam prescribed for short-term relief, with evaluation for long-acting medication if symptoms persist. - Prescribe alprazolam for short-term anxiety relief, to be taken up  to twice a day  as needed - Evaluate anxiety levels next week to consider long-acting anxiolytic if needed    I reviewed lab results and outside records for this visit and discussed relevant results with the patient. Diagnosis, plan of care and treatment options were also discussed in detail with the patient. Opportunity provided to ask questions and answers provided to his apparent satisfaction. Provided instructions to call our clinic with any problems, questions or concerns prior to return visit. I recommended to continue follow-up with PCP and sub-specialists. He verbalized understanding and agreed with the plan.   NCCN guidelines have been consulted in the planning of this patient's care.  I spent a total of 40 minutes during this encounter with the patient including review of chart and various tests results, discussions about plan of care and coordination of care plan.   Meryl Crutch, MD  07/09/2023 2:02 PM  [facility] - A DE[facility] [address] [place] [address] Dept: [phone] Dept Fax: [phone]    CHIEF COMPLAINT/ REASON FOR VISIT:   Follow-up for squamous cell carcinoma of the right tonsil, stage II disease (cT3,cN2,cM0,p16+)  Current Treatment: Concurrent chemoradiation with weekly cisplatin started from 07/09/2023.  INTERVAL HISTORY:    Discussed the use of AI scribe software for clinical note transcription with the patient, who gave verbal consent to proceed.   Isaac Carpenter is here today for repeat clinical assessment.   Over the past 2 months, the patient has experienced significant weight loss, dropping from 301 lbs to 282 lbs.   The patient has a feeding tube in place, but it has not been used yet. The patient's ability to eat has fluctuated, with periods of good appetite followed by difficulty eating, likely due to the swelling. The patient is also experiencing anxiety, which is causing  racing thoughts and restlessness.  The patient has been prescribed dexamethasone, Zofran, and Compazine to manage potential nausea from chemotherapy. The patient also has oxycodone available for pain management, but has not needed to use it recently. The patient has been advised to use a mouthwash made from baking soda and salt to manage oral discomfort.  I have reviewed the past medical history, past surgical history, social history and family history with the patient and they are unchanged from previous note.  HISTORY OF PRESENT ILLNESS:   ONCOLOGY HISTORY:   He presented to his PCP Dr Betty [name] on 05-17-23 with complains of right-sided jaw/maxillary pain that tends to occur when coughing. Pain had persisted for several months at that time following an upper respiratory infection.    Subsequently, he underwent a CT soft tissue neck on 05-17-23 which revealed an ulcerated enhancing mass involving the superior oropharynx and soft palate/uvula measuring 3.4 x 3.0 x 3.6 cm in the greatest extent that is concerning for malignancy. No evidence of cervical lymphadenopathy was indicated on scan    In light of findings, the patient saw Dr. Ashok Croon on 05-27-23 with worsening symptoms. During his visit, he underwent a flexible fiberoptic laryngoscopy with several biopsies of the friable tissue from the right oropharyngeal mass.     Biopsy of right oropharynx mass on 05-27-23 revealed: Invasive moderately differentiated squamous cell carcinoma. Immunohistochemistry for p16 shows diffuse strong positivity.    During most recent visit with Dr. Irene Pap on 06-04-23, patient reported constant worsening symptoms of odynophagia and dysphagia. Stating that he's unable to maintain adequate nutrition  as food typically consists of few bites of soft food and liquids. Pain does not seem to be managed by pain relievers including oxycodone. To further evaluate the extent of the disease, he underwent a PET scan  performed on 06-07-23 revealing a right  oropharyngeal tumor >4cm, likely b/l level II adenopathy, no distant metastases.    cT3,cN2,cM0,p16+ tumor, Stage II disease.    Plan made for concurrent chemoradiation with weekly cisplatin. Started Cisplatin from 07/09/2023 and radiation from 07/12/23.   Oncology History  Malignant neoplasm of prostate (HCC)  05/13/2022 Cancer Staging   Staging form: Prostate, AJCC 8th Edition - Clinical stage from 05/13/2022: Stage IIC (cT1c, cN0, cM0, PSA: 13.2, Grade Group: 3) - Signed by Marcello Fennel, PA-C on 07/17/2022 Histopathologic type: Adenocarcinoma, NOS Stage prefix: Initial diagnosis Prostate specific antigen (PSA) range: 10 to 19 Gleason primary pattern: 4 Gleason secondary pattern: 3 Gleason score: 7 Histologic grading system: 5 grade system Number of biopsy cores examined: 12 Number of biopsy cores positive: 4 Location of positive needle core biopsies: Both sides   07/17/2022 Initial Diagnosis   Malignant neoplasm of prostate (HCC)   Malignant neoplasm of tonsillar fossa (HCC)  06/08/2023 Initial Diagnosis   Malignant neoplasm of tonsillar fossa (HCC)   06/08/2023 Cancer Staging   Staging form: Pharynx - HPV-Mediated Oropharynx, AJCC 8th Edition - Clinical stage from 06/08/2023: Stage II (cT3, cN2, cM0, p16+) - Signed by Lonie Peak, MD on 06/08/2023 Stage prefix: Initial diagnosis   07/09/2023 -  Chemotherapy   Patient is on Treatment Plan : HEAD/NECK Cisplatin (40) q7d         REVIEW OF SYSTEMS:   Review of Systems - Oncology  All other pertinent systems were reviewed with the patient and are negative.  ALLERGIES: He is allergic to testosterone.  MEDICATIONS:  Current Outpatient Medications  Medication Sig Dispense Refill   acetaminophen (TYLENOL) 500 MG tablet Take 1 tablet (500 mg total) by mouth every 6 (six) hours. Please take every 6 hrs and stagger with Motrin 3 hrs apart from each other. Please do not exceed maximum daily  dose to avoid liver damage 30 tablet 1   ALPRAZolam (XANAX) 1 MG tablet Take 0.5 tablets (0.5 mg total) by mouth 2 (two) times daily as needed for anxiety. 60 tablet 0   aspirin EC 81 MG tablet Take 81 mg by mouth daily. Swallow whole.     atorvastatin (LIPITOR) 20 MG tablet Take 1 tablet (20 mg total) by mouth daily. 90 tablet 3   baclofen (LIORESAL) 10 MG tablet Take 0.5-1 tablets (5-10 mg total) by mouth at bedtime as needed for muscle spasms. 10 each 0   ibuprofen (ADVIL) 600 MG tablet Take 1 tablet (600 mg total) by mouth every 6 (six) hours. Please take every 6 hrs, and stagger this medication 3 hrs apart from Tylenol 30 tablet 0   levothyroxine (SYNTHROID) 88 MCG tablet TAKE ONE TABLET BY MOUTH DAILY. 90 tablet 2   lidocaine-prilocaine (EMLA) cream Apply to affected area once 30 g 3   lisinopril-hydrochlorothiazide (ZESTORETIC) 20-25 MG tablet Take 1 tablet by mouth daily. 90 tablet 2   solifenacin (VESICARE) 5 MG tablet Take 5 mg by mouth daily.     tamsulosin (FLOMAX) 0.4 MG CAPS capsule Take 1 capsule (0.4 mg total) by mouth 2 (two) times daily after a meal. 60 capsule 2   dexamethasone (DECADRON) 4 MG tablet Take 2 tablets (8 mg) by mouth daily x 3 days starting the day after cisplatin  chemotherapy. Take with food. (Patient not taking: Reported on 07/09/2023) 30 tablet 1   gabapentin (NEURONTIN) 100 MG capsule Take 1 capsule (100 mg total) by mouth 3 (three) times daily. (Patient not taking: Reported on 07/09/2023) 90 capsule 3   lidocaine (XYLOCAINE) 2 % solution Use as directed 15 mLs in the mouth or throat every 4 (four) hours as needed for mouth pain. (Patient not taking: Reported on 07/09/2023) 100 mL 1   magic mouthwash SOLN TAKE BY MOUTH THREE TIMES DAILY AS NEEDED FOR UP TO 10 DAYS FOR MOUTH PAIN (Patient not taking: Reported on 07/09/2023)     Nutritional Supplements (NUTREN 1.5) LIQD 2 cartons Nutren 1.5 (500 ml) QID via tube. Flush with 60 ml water before/after each bolus.  Provide additional 250 ml water flush 4x/day in between feedings to meet hydration needs. Provides 3000 kcal, 136 g protein, 1528 ml free water (3008 ml total water) 2000 ml/day meets 100% DRI     ondansetron (ZOFRAN) 8 MG tablet Take 1 tablet (8 mg total) by mouth every 8 (eight) hours as needed for nausea or vomiting. Start on the third day after cisplatin. (Patient not taking: Reported on 07/09/2023) 30 tablet 1   oxyCODONE 10 MG TABS Take 1 tablet (10 mg total) by mouth every 6 (six) hours as needed for severe pain (pain score 7-10). Use 1 tab Q4hrs for pain and if continue to have severe pain ok to take 2 tabs Q4hrs (Patient not taking: Reported on 07/09/2023) 90 tablet 0   prochlorperazine (COMPAZINE) 10 MG tablet Take 1 tablet (10 mg total) by mouth every 6 (six) hours as needed (Nausea or vomiting). (Patient not taking: Reported on 07/09/2023) 30 tablet 1   No current facility-administered medications for this visit.   Facility-Administered Medications Ordered in Other Visits  Medication Dose Route Frequency Provider Last Rate Last Admin   0.9 %  sodium chloride infusion   Intravenous Continuous Jaelin Devincentis, MD 10 mL/hr at 07/09/23 0924 New Bag at 07/09/23 0924   CISplatin (PLATINOL) 100 mg in sodium chloride 0.9 % 500 mL chemo infusion  40 mg/m2 (Treatment Plan Recorded) Intravenous Once Murad Staples, MD 600 mL/hr at 07/09/23 1324 100 mg at 07/09/23 1324   heparin lock flush 100 unit/mL  500 Units Intracatheter Once PRN Kiyanna Biegler, MD       sodium chloride flush (NS) 0.9 % injection 10 mL  10 mL Intracatheter PRN Jonathon Tan, MD         VITALS:   Blood pressure 121/70, pulse (!) 56, temperature 97.6 F (36.4 C), temperature source Temporal, resp. rate 16, weight 282 lb (127.9 kg), SpO2 96%.  Wt Readings from Last 3 Encounters:  07/09/23 282 lb (127.9 kg)  07/05/23 280 lb (127 kg)  06/18/23 280 lb (127 kg)    Body mass index is 37.21 kg/m.    Onc Performance Status -  07/09/23 0847       ECOG Perf Status   ECOG Perf Status Ambulatory and capable of all selfcare but unable to carry out any work activities.  Up and about more than 50% of waking hours      KPS SCALE   KPS % SCORE Normal activity with effort, some s/s of disease             PHYSICAL EXAM:   Physical Exam Constitutional:      General: He is not in acute distress.    Appearance: Normal appearance.  HENT:  Head: Normocephalic and atraumatic.     Mouth/Throat:     Comments: Right oropharynx tonsil tumor eroding through soft palate, tumor reaches uvula, uvula swollen Eyes:     General: No scleral icterus.    Conjunctiva/sclera: Conjunctivae normal.  Cardiovascular:     Rate and Rhythm: Normal rate and regular rhythm.     Heart sounds: Normal heart sounds.  Pulmonary:     Effort: Pulmonary effort is normal.     Breath sounds: Normal breath sounds.  Chest:     Comments: Right sided Port-A-Cath on place without any signs of infection Abdominal:     General: There is no distension.     Comments: Feeding tube in place  Musculoskeletal:     Right lower leg: No edema.     Left lower leg: No edema.  Neurological:     General: No focal deficit present.     Mental Status: He is alert and oriented to person, place, and time.  Psychiatric:        Mood and Affect: Mood normal.        Behavior: Behavior normal.        Thought Content: Thought content normal.      LABORATORY DATA:   I have reviewed the data as listed.  Results for orders placed or performed in visit on 07/09/23  Magnesium  Result Value Ref Range   Magnesium 1.9 1.7 - 2.4 mg/dL  Basic Metabolic Panel - Cancer Center Only  Result Value Ref Range   Sodium 139 135 - 145 mmol/L   Potassium 3.5 3.5 - 5.1 mmol/L   Chloride 101 98 - 111 mmol/L   CO2 33 (H) 22 - 32 mmol/L   Glucose, Bld 134 (H) 70 - 99 mg/dL   BUN 17 8 - 23 mg/dL   Creatinine 1.61 0.96 - 1.24 mg/dL   Calcium 8.8 (L) 8.9 - 10.3 mg/dL   GFR,  Estimated >04 >54 mL/min   Anion gap 5 5 - 15  CBC with Differential (Cancer Center Only)  Result Value Ref Range   WBC Count 5.5 4.0 - 10.5 K/uL   RBC 4.27 4.22 - 5.81 MIL/uL   Hemoglobin 13.1 13.0 - 17.0 g/dL   HCT 09.8 11.9 - 14.7 %   MCV 92.5 80.0 - 100.0 fL   MCH 30.7 26.0 - 34.0 pg   MCHC 33.2 30.0 - 36.0 g/dL   RDW 82.9 56.2 - 13.0 %   Platelet Count 220 150 - 400 K/uL   nRBC 0.0 0.0 - 0.2 %   Neutrophils Relative % 58 %   Neutro Abs 3.3 1.7 - 7.7 K/uL   Lymphocytes Relative 23 %   Lymphs Abs 1.3 0.7 - 4.0 K/uL   Monocytes Relative 9 %   Monocytes Absolute 0.5 0.1 - 1.0 K/uL   Eosinophils Relative 9 %   Eosinophils Absolute 0.5 0.0 - 0.5 K/uL   Basophils Relative 1 %   Basophils Absolute 0.0 0.0 - 0.1 K/uL   Immature Granulocytes 0 %   Abs Immature Granulocytes 0.01 0.00 - 0.07 K/uL    RADIOGRAPHIC STUDIES:  I have personally reviewed the radiological images as listed and agree with the findings in the report.  DG SWALLOW FUNC OP MEDICARE SPEECH PATH Result Date: 06/28/2023 Table formatting from the original result was not included. Modified Barium Swallow Study Patient Details Name: SABER DICKERMAN MRN: [id number] Date of Birth: 1951/04/14 Today's Date: 06/28/2023 HPI/PMH: HPI: Stockton Nunley is a 73 y.o. male  with PMH: HLD, HTN, OSA, recently diagnosed with stage II SCC of right tonsil with plans for concurrent chemoradiation. He had PEG placed in IR on 06/18/23. He presented to this OP MBS for evaluation of baseline swallow function and to determine aspiration risk. Patient reported pain with swallowing since bipsy of oropharyngeal mass on 05/27/23, however it has improved since then and he is able to eat and drink. Clinical Impression: Clinical Impression: Patient presents with an oropharyngeal swallow that is largely [facility]. No penetration or aspiration occured during any phase of the swallow with any of the tested liquid or solid consistencies. Anterior hyoid excursion appeared  partial in completion but patient with full epiglottic inversion and laryngeal vestibule closure. With solids, he did exhibit increased amount of vallecular residuals but with clearance with subsequent swallows and sips of liquids. PES opening appeared [facility] and no retrograde flow of barium observed in upper esophagus. Trace amount of barium residue remained in oropharynx at level of known mass, however barium fully cleared with subsequent swallows. Barium tablet transit appeared to stall in distal esophagus.    SLP recommends referral to OP SLP secondary to planned oropharyngeal chemoradiation treatment. Recommendations/Plan: Swallowing Evaluation Recommendations Swallowing Evaluation Recommendations Recommendations: PO diet PO Diet Recommendation: Regular; Thin liquids (Level 0) Liquid Administration via: Cup; Straw Medication Administration: Whole meds with liquid Supervision: Patient able to self-feed Swallowing strategies  : Follow solids with liquids Postural changes: Position pt fully upright for meals Oral care recommendations: Oral care BID (2x/day) Treatment Plan Treatment Plan Follow-up recommendations: Outpatient SLP Recommendations Recommendations for follow up therapy are one component of a multi-disciplinary discharge planning process, led by the attending physician.  Recommendations may be updated based on patient status, additional functional criteria and insurance authorization. Assessment: Orofacial Exam: Orofacial Exam Oral Cavity: Oral Hygiene: WFL Oral Cavity - Dentition: Adequate natural dentition Orofacial Anatomy: Other (comment) Oral Motor/Sensory Function: WFL Anatomy: Anatomy: Prominent cricopharyngeus Boluses Administered: Boluses Administered Boluses Administered: Thin liquids (Level 0); Mildly thick liquids (Level 2, nectar thick); Moderately thick liquids (Level 3, honey thick); Puree; Solid  Oral Impairment Domain: Oral Impairment Domain Lip Closure: No labial escape Tongue control  during bolus hold: Cohesive bolus between tongue to palatal seal Bolus preparation/mastication: Timely and efficient chewing and mashing Bolus transport/lingual motion: Brisk tongue motion Oral residue: Complete oral clearance Location of oral residue : N/A Initiation of pharyngeal swallow : Posterior laryngeal surface of the epiglottis  Pharyngeal Impairment Domain: Pharyngeal Impairment Domain Soft palate elevation: No bolus between soft palate (SP)/pharyngeal wall (PW) Laryngeal elevation: Complete superior movement of thyroid cartilage with complete approximation of arytenoids to epiglottic petiole Anterior hyoid excursion: Partial anterior movement Epiglottic movement: Complete inversion Laryngeal vestibule closure: Complete, no air/contrast in laryngeal vestibule Pharyngeal stripping wave : Present - complete Pharyngeal contraction (A/P view only): N/A Pharyngoesophageal segment opening: Complete distension and complete duration, no obstruction of flow Tongue base retraction: No contrast between tongue base and posterior pharyngeal wall (PPW) Pharyngeal residue: Collection of residue within or on pharyngeal structures Location of pharyngeal residue: Valleculae; Pharyngeal wall; Tongue base  Esophageal Impairment Domain: Esophageal Impairment Domain Esophageal clearance upright position: Complete clearance, esophageal coating Pill: Pill Consistency administered: Thin liquids (Level 0) Thin liquids (Level 0): Impaired (see clinical impressions) Penetration/Aspiration Scale Score: Penetration/Aspiration Scale Score 1.  Material does not enter airway: Thin liquids (Level 0); Mildly thick liquids (Level 2, nectar thick); Moderately thick liquids (Level 3, honey thick); Puree; Solid; Pill Compensatory Strategies: Compensatory Strategies Compensatory strategies: No  General Information: No data recorded Diet Prior to this Study: Regular; Thin liquids (Level 0)   No data recorded  Respiratory Status: WFL    Supplemental O2: None (Room air)   History of Recent Intubation: No  Behavior/Cognition: Alert; Cooperative; Pleasant mood Self-Feeding Abilities: Able to self-feed Baseline vocal quality/speech: Normal Volitional Cough: Able to elicit Volitional Swallow: Able to elicit Exam Limitations: No limitations Goal Planning: Consulted and agree with results and recommendations: Patient Pain: No data recorded End of Session: Start Time:SLP Start Time (ACUTE ONLY): 1325 Stop Time: SLP Stop Time (ACUTE ONLY): 1345 Time Calculation:SLP Time Calculation (min) (ACUTE ONLY): 20 min Charges: SLP Evaluations $ SLP Speech Visit: 1 Visit SLP Evaluations $Outpatient MBS Swallow: 1 Procedure SLP visit diagnosis: SLP Visit Diagnosis: Dysphagia, oropharyngeal phase (R13.12) Past Medical History: Past Medical History: Diagnosis Date  Arthritis   Cancer (HCC)   History of partial thyroidectomy STATES OVERACTIVE THYROID-- NO ISSUES SINCE AGE 55 AND NO MEDS  Hyperlipidemia   Hypertension   Hypothyroidism   Sleep apnea   Per patient he tried CPAP but could not tolerate.  Thyroid disease  Past Surgical History: Past Surgical History: Procedure Laterality Date  CIRCUMCISION  01/04/2012  Procedure: CIRCUMCISION ADULT;  Surgeon: Kathi Ludwig, MD;  Location: [facility];  Service: Urology;  Laterality: N/A;  COLONOSCOPY  04/08/2020  Vito Cirigliano  CYSTOSCOPY  09/04/2022  Procedure: CYSTOSCOPY;  Surgeon: Marcine Matar, MD;  Location: [facility];  Service: Urology;;  IR GASTROSTOMY TUBE MOD SED  06/18/2023  IR IMAGING GUIDED PORT INSERTION  06/18/2023  RADIOACTIVE SEED IMPLANT N/A 09/04/2022  Procedure: RADIOACTIVE SEED IMPLANT/BRACHYTHERAPY IMPLANT;  Surgeon: Marcine Matar, MD;  Location: [facility];  Service: Urology;  Laterality: N/A;  90 MINS  SPACE OAR INSTILLATION N/A 09/04/2022  Procedure: SPACE OAR INSTILLATION;  Surgeon: Marcine Matar, MD;  Location: [facility];  Service: Urology;  Laterality: N/A;  THYROIDECTOMY, PARTIAL  AGE 55  OVERACTIVE THYROID Angela Nevin, MA, CCC-SLP Speech Therapy CLINICAL DATA:  Dysphagia. Cough/GE reflux disease/other secondary diagnosis EXAM: MODIFIED BARIUM SWALLOW TECHNIQUE: Radiologist in attendance for the exam. Different consistencies of barium were administered orally to the patient by the Speech Pathologist. Imaging of the pharynx was performed in the lateral projection. The radiologist was present in the fluoroscopy room for this study, providing personal supervision. FLUOROSCOPY: Radiation Exposure Index (as provided by the fluoroscopic device): 5.6 mGy Kerma COMPARISON:  None Available. FINDINGS: Vestibular  Penetration:  None seen. Aspiration:  None seen. Other:  None. IMPRESSION: No aspiration. Please refer to the Speech Pathologists report for complete details and recommendations. Electronically Signed   By: Leanna Battles M.D.   On: 06/28/2023 14:06  IR GASTROSTOMY TUBE MOD SED Result Date: 06/18/2023 INDICATION: Malignant neoplasm of the tonsillar fossa. Gastrostomy tube requested for supplemental nutrition. EXAM: PERCUTANEOUS GASTROSTOMY TUBE WITH FLUOROSCOPIC GUIDANCE Physician: Rachelle Hora. Lowella Dandy, MD MEDICATIONS: Ancef 2 g; Antibiotics were administered within 1 hour of the procedure. Glucagon 1 mg IV ANESTHESIA/SEDATION: Moderate (conscious) sedation was employed during this procedure. A total of Versed 2mg  and fentanyl 75 mcg was administered intravenously at the order of the provider performing the procedure. Total intra-service moderate sedation time: 38 minutes. Patient's level of consciousness and vital signs were monitored continuously by radiology nurse throughout the procedure under the supervision of the provider performing the procedure. FLUOROSCOPY: Radiation Exposure Index (as provided by the fluoroscopic device): 73.5 mGy Kerma COMPLICATIONS: None immediate. PROCEDURE: The procedure was  explained to the  patient. The risks and benefits of the procedure were discussed and the patient's questions were addressed. Informed consent was obtained from the patient. This procedure was performed immediately following placement of the subcutaneous Port-A-Cath. Nasogastric tube was placed with fluoroscopy. Transverse colon was visible with fluoroscopy. The anterior abdomen was prepped and draped in sterile fashion. Maximal barrier sterile technique was utilized including caps, mask, sterile gowns, sterile gloves, sterile drape, hand hygiene and skin antiseptic. Stomach was inflated with air through the orogastric tube. The skin and subcutaneous tissues were anesthetized with 1% lidocaine. Using fluoroscopic guidance, a Saf-T-Pexy T fastener was placed within the stomach. A second Saf-T-Pexy T fastener was placed using fluoroscopy. An incision was made between the T-fasteners. Needle was directed through this incision into the stomach using fluoroscopic guidance. Contrast injection confirmed placement in the stomach. Wire was placed in the stomach. The tract was dilated to accommodate a 20 French peel-away sheath. An 66 French Entuit gastrostomy tube was advanced over wire into the stomach. The balloon was inflated with 10 mL of saline. Peel-away sheath was removed. Contrast injection confirmed placement in the stomach. Fluoroscopic images were obtained for documentation. The gastrostomy tube was flushed with normal saline. IMPRESSION: Successful fluoroscopic guided percutaneous gastrostomy tube placement. Plan for removal of the T-fasteners in approximately 2 weeks. Electronically Signed   By: Richarda Overlie M.D.   On: 06/18/2023 14:10   IR IMAGING GUIDED PORT INSERTION Result Date: 06/18/2023 INDICATION: Malignant neoplasm of tonsillar fossa. Port-A-Cath needed for treatment. EXAM: FLUOROSCOPIC AND ULTRASOUND GUIDED PLACEMENT OF A SUBCUTANEOUS PORT COMPARISON:  None Available. MEDICATIONS: Moderate sedation ANESTHESIA/SEDATION:  Moderate (conscious) sedation was employed during this procedure. A total of Versed 1 mg and fentanyl 75 mcg was administered intravenously at the order of the provider performing the procedure. Total intra-service moderate sedation time: 25 minutes. Patient's level of consciousness and vital signs were monitored continuously by radiology nurse throughout the procedure under the supervision of the provider performing the procedure. FLUOROSCOPY TIME:  Radiation Exposure Index (as provided by the fluoroscopic device): 6 mGy Kerma COMPLICATIONS: None immediate. PROCEDURE: The procedure, risks, benefits, and alternatives were explained to the patient. Questions regarding the procedure were encouraged and answered. The patient understands and consents to the procedure. Patient was placed supine on the interventional table. Ultrasound confirmed a patent right internal jugular vein. Ultrasound image was saved for documentation. The right chest and neck were cleaned with a skin antiseptic and a sterile drape was placed. Maximal barrier sterile technique was utilized including caps, mask, sterile gowns, sterile gloves, sterile drape, hand hygiene and skin antiseptic. The right neck was anesthetized with 1% lidocaine. Small incision was made in the right neck with a blade. Micropuncture set was placed in the right internal jugular vein with ultrasound guidance. The micropuncture wire was used for measurement purposes. The right chest was anesthetized with 1% lidocaine with epinephrine. #15 blade was used to make an incision and a subcutaneous port pocket was formed. 8 french Power Port was assembled. Subcutaneous tunnel was formed with a stiff tunneling device. The port catheter was brought through the subcutaneous tunnel. The port was placed in the subcutaneous pocket. The micropuncture set was exchanged for a peel-away sheath. The catheter was placed through the peel-away sheath and the tip was positioned at the superior  cavoatrial junction. Catheter placement was confirmed with fluoroscopy. The port was accessed and flushed with heparinized saline. The port pocket was closed using two layers of absorbable sutures and  Dermabond. The vein skin site was closed using a single layer of absorbable suture and Dermabond. Sterile dressings were applied. Patient tolerated the procedure well without an immediate complication. Ultrasound and fluoroscopic images were taken and saved for this procedure. IMPRESSION: Placement of a subcutaneous power-injectable port device. Catheter tip at the superior cavoatrial junction. Electronically Signed   By: Richarda Overlie M.D.   On: 06/18/2023 13:56    CODE STATUS:  Code Status History     Date Active Date Inactive Code Status Order ID Comments User Context   06/18/2023 1400 06/19/2023 0508 Full Code [id number]  Richarda Overlie, MD HOV    Questions for Most Recent Historical Code Status (Order [id number])     Question Answer   By: Consent: discussion documented in EHR            No orders of the defined types were placed in this encounter.    Future Appointments  Date Time Provider Department Center  07/12/2023  8:45 AM Lonie Peak, MD [facility] None  07/13/2023  9:00 AM [facility] [id number] [facility] None  07/14/2023  9:15 AM Lonie Peak, MD [facility] None  07/15/2023  2:30 PM [facility] [id number] [facility] None  07/15/2023  2:45 PM [facility] FLUSH [facility] None  07/15/2023  3:15 PM Rily Nickey, MD [facility] None  07/16/2023  7:30 AM [facility] INFUSION [facility] None  07/16/2023  9:45 AM Noreene Larsson, RD [facility] None  07/16/2023  2:15 PM [facility] [id number] [facility] None  07/19/2023  8:30 AM [facility] LINAC 4 [facility] None  07/20/2023  8:30 AM [facility] LINAC 4 [facility] None  07/21/2023  8:30 AM [facility] LINAC 4 [facility] None  07/22/2023  9:45 AM [facility] LINAC 4 [facility] None  07/22/2023 10:15 AM [facility] FLUSH [facility] None   07/22/2023 10:45 AM Arkin Imran, MD [facility] None  07/23/2023  9:30 AM [facility] LINAC 4 [facility] None  07/23/2023 10:00 AM [facility] INFUSION [facility] None  07/26/2023  8:30 AM [facility] LINAC 4 [facility] None  07/27/2023  8:30 AM [facility] LINAC 4 [facility] None  07/28/2023  8:30 AM [facility] LINAC 4 [facility] None  07/29/2023  8:30 AM [facility] LINAC 4 [facility] None  07/30/2023  8:15 AM [facility] FLUSH [facility] None  07/30/2023  8:30 AM [facility] LINAC 4 [facility] None  07/30/2023  8:45 AM Pepper Wyndham, MD [facility] None  07/30/2023 10:00 AM [facility] INFUSION [facility] None  08/02/2023  8:30 AM [facility] LINAC 4 [facility] None  08/03/2023  8:30 AM [facility] LINAC 4 [facility] None  08/04/2023  8:30 AM [facility] LINAC 4 [facility] None  08/05/2023 10:15 AM [facility] LINAC 4 [facility] None  08/05/2023 10:45 AM [facility] FLUSH [facility] None  08/05/2023 11:15 AM Kyriaki Moder, MD [facility] None  08/06/2023  7:30 AM [facility] INFUSION [facility] None  08/06/2023  2:45 PM [facility] LINAC 4 [facility] None  08/09/2023  8:30 AM [facility] LINAC 4 [facility] None  08/10/2023  8:30 AM [facility] LINAC 4 [facility] None  08/11/2023  8:30 AM [facility] LINAC 4 [facility] None  08/12/2023  8:30 AM [facility] LINAC 4 [facility] None  08/13/2023  8:15 AM [facility] FLUSH [facility] None  08/13/2023  8:30 AM [facility] LINAC 4 [facility] None  08/13/2023  8:45 AM Donja Tipping, MD [facility] None  08/13/2023 10:00 AM [facility] INFUSION [facility] None  08/16/2023  8:30 AM [facility] LINAC 4 [facility] None  08/17/2023  8:30 AM [facility] LINAC 4 [facility] None  08/18/2023  8:30 AM [facility] LINAC 4 [facility] None  08/19/2023  8:30 AM [facility] LINAC 4  [facility] None  08/20/2023  8:00 AM [facility] FLUSH [facility] None  08/20/2023  8:15 AM [facility] LINAC 4 [facility] None  08/20/2023  8:30 AM Haylei Cobin, MD [facility]  None  08/20/2023  9:30 AM [facility] INFUSION [facility] None  08/23/2023  8:30 AM [facility] LINAC 4 [facility] None  08/24/2023  8:30 AM [facility] LINAC 4 [facility] None  08/25/2023  8:30 AM [facility] LINAC 4 [facility] None  08/26/2023  8:30 AM [facility] LINAC 4 [facility] None  08/27/2023  8:30 AM [facility] LINAC 4 [facility] None     This document was completed utilizing speech recognition software. Grammatical errors, random word insertions, pronoun errors, and incomplete sentences are an occasional consequence of this system due to software limitations, ambient noise, and hardware issues. Any formal questions or concerns about the content, text or information contained within the body of this dictation should be directly addressed to the provider for clarification.

## 2023-07-09 NOTE — Progress Notes (Signed)
 [facility] CSW Progress Note  Visual merchandiser  met with patient and spouse briefly during infusion  to assess psychosocial needs. Patient reported being tired and sore throat when drinking. Spouse and Patient denied any immediate needs. CSW will follow up in appox three weeks. Patient and spouse have direct contact if needed.    Marguerita Merles, LCSW Clinical Social Worker [facility]

## 2023-07-09 NOTE — Patient Instructions (Signed)

## 2023-07-09 NOTE — Assessment & Plan Note (Signed)
 He has been having dysphagia.  Encouraged him to use feeding tube for adequate hydration and nutrition.  Continue to eat and drink as much as he can through the mouth to preserve muscle function.  We advised him to crush or dissolve tablets and take them via feeding tube, where needed.

## 2023-07-09 NOTE — Progress Notes (Signed)
 Patient seen by Dr. Archie Patten Pasam today  Vitals are within treatment parameters:Yes   Labs are within treatment parameters: Yes   Treatment plan has been signed: Yes   Per physician team, Patient is ready for treatment and there are NO modifications to the treatment plan.

## 2023-07-11 ENCOUNTER — Other Ambulatory Visit: Payer: Self-pay | Admitting: Family Medicine

## 2023-07-11 DIAGNOSIS — E890 Postprocedural hypothyroidism: Secondary | ICD-10-CM

## 2023-07-11 DIAGNOSIS — I10 Essential (primary) hypertension: Secondary | ICD-10-CM

## 2023-07-12 ENCOUNTER — Other Ambulatory Visit: Payer: Self-pay

## 2023-07-12 ENCOUNTER — Ambulatory Visit
Admission: RE | Admit: 2023-07-12 | Discharge: 2023-07-12 | Disposition: A | Discharge status: Home / Self Care | Source: Ambulatory Visit | Attending: Radiation Oncology | Admitting: Radiation Oncology

## 2023-07-12 ENCOUNTER — Inpatient Hospital Stay: Payer: Medicare Other

## 2023-07-12 ENCOUNTER — Ambulatory Visit

## 2023-07-12 ENCOUNTER — Telehealth: Payer: Self-pay

## 2023-07-12 DIAGNOSIS — Z79899 Other long term (current) drug therapy: Secondary | ICD-10-CM | POA: Diagnosis not present

## 2023-07-12 DIAGNOSIS — Z7982 Long term (current) use of aspirin: Secondary | ICD-10-CM | POA: Diagnosis not present

## 2023-07-12 DIAGNOSIS — R1310 Dysphagia, unspecified: Secondary | ICD-10-CM | POA: Diagnosis not present

## 2023-07-12 DIAGNOSIS — Z79891 Long term (current) use of opiate analgesic: Secondary | ICD-10-CM | POA: Diagnosis not present

## 2023-07-12 DIAGNOSIS — Z923 Personal history of irradiation: Secondary | ICD-10-CM | POA: Diagnosis not present

## 2023-07-12 DIAGNOSIS — Z95828 Presence of other vascular implants and grafts: Secondary | ICD-10-CM | POA: Diagnosis not present

## 2023-07-12 DIAGNOSIS — Z510 Encounter for antineoplastic radiation therapy: Secondary | ICD-10-CM | POA: Diagnosis not present

## 2023-07-12 DIAGNOSIS — M546 Pain in thoracic spine: Secondary | ICD-10-CM | POA: Diagnosis not present

## 2023-07-12 DIAGNOSIS — Z5111 Encounter for antineoplastic chemotherapy: Secondary | ICD-10-CM | POA: Diagnosis not present

## 2023-07-12 DIAGNOSIS — G893 Neoplasm related pain (acute) (chronic): Secondary | ICD-10-CM | POA: Diagnosis not present

## 2023-07-12 DIAGNOSIS — Z87891 Personal history of nicotine dependence: Secondary | ICD-10-CM | POA: Diagnosis not present

## 2023-07-12 DIAGNOSIS — C090 Malignant neoplasm of tonsillar fossa: Secondary | ICD-10-CM | POA: Diagnosis not present

## 2023-07-12 DIAGNOSIS — Z931 Gastrostomy status: Secondary | ICD-10-CM | POA: Diagnosis not present

## 2023-07-12 LAB — RAD ONC ARIA SESSION SUMMARY
Course Elapsed Days: 0
Plan Fractions Treated to Date: 1
Plan Prescribed Dose Per Fraction: 2 Gy
Plan Total Fractions Prescribed: 35
Plan Total Prescribed Dose: 70 Gy
Reference Point Dosage Given to Date: 2 Gy
Reference Point Session Dosage Given: 2 Gy
Session Number: 1

## 2023-07-12 NOTE — Telephone Encounter (Signed)
-----   Message from Nurse Cortney P sent at 07/09/2023  2:31 PM EDT ----- Regarding: Dr Arlana Pouch, first time Cisplatin Dr Arlana Pouch patient, first time Cisplatin. Patient tolerated treatment well. As an FYI - Pt informed nutrition that he had started having some difficulty swallowing and they wanted to be prepared to know which medications could be crushed and what couldn't. I reached out to MD and pharmacy, a pharmacist should be (if they haven't already) reaching out to them to go over the medications.

## 2023-07-12 NOTE — Telephone Encounter (Signed)
 Wife Dolores Lory that Mr. Eckerson  is doing well.  He is using tube feedings and urinating well. urinating well.  He knows to call the office at [phone] if he has any questions or concerns. Vernona Rieger to call and speak with Dr. Zenda Alpers nurse to verify the Cardura prescription sent in on 07-09-23 as she was not expecting that prescription from visit.

## 2023-07-12 NOTE — Telephone Encounter (Signed)
 Oral Oncology Pharmacist Encounter  Received message from RN on Friday, 07/09/23 that his wife was wondering if his medications that he is currently taking can be crushed. We went over the medications and I have sent them an attachment with the medications that can be crushed as well as how to crush them appropriately. We did discuss that if he does need to crush the Zestoretic that they will need two separate prescriptions with each of the medications as the combination pill cannot be crushed. We also discussed how to administer the medications through his feeding tube. All is included in the document that I sent through the [place] message to them. His wife states that he does not currently need to crush them but wanted to have the information in case she needs to in the future.  Patients wife appreciated the information and received my phone number in case she needs to contact me regarding the medications.   Bethel Born, PharmD Hematology/Oncology Clinical Pharmacist [facility] [phone]

## 2023-07-12 NOTE — Progress Notes (Signed)
 Oncology Nurse Navigator Documentation   To provide support, encouragement and care continuity, met with Isaac Carpenter after his initial RT.  He was accompanied by his wife. I reviewed the 2-step treatment process, answered questions.  Isaac Carpenter completed treatment without difficulty, denied questions/concerns. I reviewed the registration/arrival procedure for subsequent treatments. I encouraged them to call me with questions/concerns as treatments proceed.   Hedda Slade RN, BSN, OCN Head & Neck Oncology Nurse Navigator [facility] Phone # [phone]  Fax # [phone]

## 2023-07-13 ENCOUNTER — Ambulatory Visit
Admission: RE | Admit: 2023-07-13 | Discharge: 2023-07-13 | Disposition: A | Discharge status: Home / Self Care | Source: Ambulatory Visit | Attending: Radiation Oncology

## 2023-07-13 ENCOUNTER — Other Ambulatory Visit: Payer: Self-pay

## 2023-07-13 DIAGNOSIS — Z931 Gastrostomy status: Secondary | ICD-10-CM | POA: Diagnosis not present

## 2023-07-13 DIAGNOSIS — R1310 Dysphagia, unspecified: Secondary | ICD-10-CM | POA: Diagnosis not present

## 2023-07-13 DIAGNOSIS — Z5111 Encounter for antineoplastic chemotherapy: Secondary | ICD-10-CM | POA: Diagnosis not present

## 2023-07-13 DIAGNOSIS — Z95828 Presence of other vascular implants and grafts: Secondary | ICD-10-CM | POA: Diagnosis not present

## 2023-07-13 DIAGNOSIS — C090 Malignant neoplasm of tonsillar fossa: Secondary | ICD-10-CM | POA: Diagnosis not present

## 2023-07-13 DIAGNOSIS — Z79891 Long term (current) use of opiate analgesic: Secondary | ICD-10-CM | POA: Diagnosis not present

## 2023-07-13 DIAGNOSIS — Z7982 Long term (current) use of aspirin: Secondary | ICD-10-CM | POA: Diagnosis not present

## 2023-07-13 DIAGNOSIS — M546 Pain in thoracic spine: Secondary | ICD-10-CM | POA: Diagnosis not present

## 2023-07-13 DIAGNOSIS — G893 Neoplasm related pain (acute) (chronic): Secondary | ICD-10-CM | POA: Diagnosis not present

## 2023-07-13 DIAGNOSIS — Z79899 Other long term (current) drug therapy: Secondary | ICD-10-CM | POA: Diagnosis not present

## 2023-07-13 DIAGNOSIS — Z510 Encounter for antineoplastic radiation therapy: Secondary | ICD-10-CM | POA: Diagnosis not present

## 2023-07-13 DIAGNOSIS — Z87891 Personal history of nicotine dependence: Secondary | ICD-10-CM | POA: Diagnosis not present

## 2023-07-13 DIAGNOSIS — Z923 Personal history of irradiation: Secondary | ICD-10-CM | POA: Diagnosis not present

## 2023-07-13 LAB — RAD ONC ARIA SESSION SUMMARY
Course Elapsed Days: 1
Plan Fractions Treated to Date: 2
Plan Prescribed Dose Per Fraction: 2 Gy
Plan Total Fractions Prescribed: 35
Plan Total Prescribed Dose: 70 Gy
Reference Point Dosage Given to Date: 4 Gy
Reference Point Session Dosage Given: 2 Gy
Session Number: 2

## 2023-07-14 ENCOUNTER — Other Ambulatory Visit: Payer: Self-pay

## 2023-07-14 ENCOUNTER — Inpatient Hospital Stay: Payer: Medicare Other

## 2023-07-14 ENCOUNTER — Ambulatory Visit
Admission: RE | Admit: 2023-07-14 | Discharge: 2023-07-14 | Disposition: A | Discharge status: Home / Self Care | Source: Ambulatory Visit | Attending: Radiation Oncology | Admitting: Radiation Oncology

## 2023-07-14 DIAGNOSIS — Z931 Gastrostomy status: Secondary | ICD-10-CM | POA: Diagnosis not present

## 2023-07-14 DIAGNOSIS — R1310 Dysphagia, unspecified: Secondary | ICD-10-CM | POA: Diagnosis not present

## 2023-07-14 DIAGNOSIS — Z923 Personal history of irradiation: Secondary | ICD-10-CM | POA: Diagnosis not present

## 2023-07-14 DIAGNOSIS — Z510 Encounter for antineoplastic radiation therapy: Secondary | ICD-10-CM | POA: Diagnosis not present

## 2023-07-14 DIAGNOSIS — Z87891 Personal history of nicotine dependence: Secondary | ICD-10-CM | POA: Diagnosis not present

## 2023-07-14 DIAGNOSIS — Z79899 Other long term (current) drug therapy: Secondary | ICD-10-CM | POA: Diagnosis not present

## 2023-07-14 DIAGNOSIS — Z95828 Presence of other vascular implants and grafts: Secondary | ICD-10-CM | POA: Diagnosis not present

## 2023-07-14 DIAGNOSIS — Z7982 Long term (current) use of aspirin: Secondary | ICD-10-CM | POA: Diagnosis not present

## 2023-07-14 DIAGNOSIS — G893 Neoplasm related pain (acute) (chronic): Secondary | ICD-10-CM | POA: Diagnosis not present

## 2023-07-14 DIAGNOSIS — C090 Malignant neoplasm of tonsillar fossa: Secondary | ICD-10-CM | POA: Diagnosis not present

## 2023-07-14 DIAGNOSIS — Z5111 Encounter for antineoplastic chemotherapy: Secondary | ICD-10-CM | POA: Diagnosis not present

## 2023-07-14 DIAGNOSIS — M546 Pain in thoracic spine: Secondary | ICD-10-CM | POA: Diagnosis not present

## 2023-07-14 DIAGNOSIS — Z79891 Long term (current) use of opiate analgesic: Secondary | ICD-10-CM | POA: Diagnosis not present

## 2023-07-14 LAB — RAD ONC ARIA SESSION SUMMARY
Course Elapsed Days: 2
Plan Fractions Treated to Date: 3
Plan Prescribed Dose Per Fraction: 2 Gy
Plan Total Fractions Prescribed: 35
Plan Total Prescribed Dose: 70 Gy
Reference Point Dosage Given to Date: 6 Gy
Reference Point Session Dosage Given: 2 Gy
Session Number: 3

## 2023-07-15 ENCOUNTER — Inpatient Hospital Stay (HOSPITAL_BASED_OUTPATIENT_CLINIC_OR_DEPARTMENT_OTHER): Payer: Medicare Other | Admitting: Oncology

## 2023-07-15 ENCOUNTER — Emergency Department (HOSPITAL_COMMUNITY)
Admission: EM | Admit: 2023-07-15 | Discharge: 2023-07-16 | Disposition: A | Discharge status: Home / Self Care | Attending: Emergency Medicine | Admitting: Emergency Medicine

## 2023-07-15 ENCOUNTER — Other Ambulatory Visit: Payer: Self-pay

## 2023-07-15 ENCOUNTER — Ambulatory Visit: Payer: Medicare Other

## 2023-07-15 ENCOUNTER — Inpatient Hospital Stay: Payer: Medicare Other

## 2023-07-15 ENCOUNTER — Encounter (HOSPITAL_COMMUNITY): Payer: Self-pay

## 2023-07-15 ENCOUNTER — Ambulatory Visit
Admission: RE | Admit: 2023-07-15 | Discharge: 2023-07-15 | Disposition: A | Discharge status: Home / Self Care | Source: Ambulatory Visit | Attending: Radiation Oncology | Admitting: Radiation Oncology

## 2023-07-15 VITALS — BP 104/65 | HR 83 | Temp 97.6°F | Resp 18 | Ht 73.0 in | Wt 273.1 lb

## 2023-07-15 DIAGNOSIS — C090 Malignant neoplasm of tonsillar fossa: Secondary | ICD-10-CM

## 2023-07-15 DIAGNOSIS — Z452 Encounter for adjustment and management of vascular access device: Secondary | ICD-10-CM | POA: Diagnosis not present

## 2023-07-15 DIAGNOSIS — F418 Other specified anxiety disorders: Secondary | ICD-10-CM | POA: Diagnosis not present

## 2023-07-15 DIAGNOSIS — Z5111 Encounter for antineoplastic chemotherapy: Secondary | ICD-10-CM

## 2023-07-15 DIAGNOSIS — Z95828 Presence of other vascular implants and grafts: Secondary | ICD-10-CM

## 2023-07-15 DIAGNOSIS — Z510 Encounter for antineoplastic radiation therapy: Secondary | ICD-10-CM | POA: Diagnosis not present

## 2023-07-15 DIAGNOSIS — C140 Malignant neoplasm of pharynx, unspecified: Secondary | ICD-10-CM | POA: Insufficient documentation

## 2023-07-15 DIAGNOSIS — R5383 Other fatigue: Secondary | ICD-10-CM | POA: Diagnosis not present

## 2023-07-15 DIAGNOSIS — Z7982 Long term (current) use of aspirin: Secondary | ICD-10-CM | POA: Diagnosis not present

## 2023-07-15 DIAGNOSIS — Z923 Personal history of irradiation: Secondary | ICD-10-CM | POA: Diagnosis not present

## 2023-07-15 DIAGNOSIS — R1310 Dysphagia, unspecified: Secondary | ICD-10-CM

## 2023-07-15 DIAGNOSIS — T82898A Other specified complication of vascular prosthetic devices, implants and grafts, initial encounter: Secondary | ICD-10-CM | POA: Diagnosis not present

## 2023-07-15 LAB — CBC WITH DIFFERENTIAL (CANCER CENTER ONLY)
Abs Immature Granulocytes: 0.07 10*3/uL (ref 0.00–0.07)
Basophils Absolute: 0 10*3/uL (ref 0.0–0.1)
Basophils Relative: 0 %
Eosinophils Absolute: 0.2 10*3/uL (ref 0.0–0.5)
Eosinophils Relative: 3 %
HCT: 42.3 % (ref 39.0–52.0)
Hemoglobin: 14.3 g/dL (ref 13.0–17.0)
Immature Granulocytes: 1 %
Lymphocytes Relative: 15 %
Lymphs Abs: 1.2 10*3/uL (ref 0.7–4.0)
MCH: 30.8 pg (ref 26.0–34.0)
MCHC: 33.8 g/dL (ref 30.0–36.0)
MCV: 91.2 fL (ref 80.0–100.0)
Monocytes Absolute: 0.8 10*3/uL (ref 0.1–1.0)
Monocytes Relative: 10 %
Neutro Abs: 6 10*3/uL (ref 1.7–7.7)
Neutrophils Relative %: 71 %
Platelet Count: 245 10*3/uL (ref 150–400)
RBC: 4.64 MIL/uL (ref 4.22–5.81)
RDW: 13.5 % (ref 11.5–15.5)
WBC Count: 8.4 10*3/uL (ref 4.0–10.5)
nRBC: 0 % (ref 0.0–0.2)

## 2023-07-15 LAB — RAD ONC ARIA SESSION SUMMARY
Course Elapsed Days: 3
Plan Fractions Treated to Date: 4
Plan Prescribed Dose Per Fraction: 2 Gy
Plan Total Fractions Prescribed: 35
Plan Total Prescribed Dose: 70 Gy
Reference Point Dosage Given to Date: 8 Gy
Reference Point Session Dosage Given: 2 Gy
Session Number: 4

## 2023-07-15 LAB — BASIC METABOLIC PANEL - CANCER CENTER ONLY
Anion gap: 5 (ref 5–15)
BUN: 20 mg/dL (ref 8–23)
CO2: 33 mmol/L — ABNORMAL HIGH (ref 22–32)
Calcium: 8.8 mg/dL — ABNORMAL LOW (ref 8.9–10.3)
Chloride: 95 mmol/L — ABNORMAL LOW (ref 98–111)
Creatinine: 0.71 mg/dL (ref 0.61–1.24)
GFR, Estimated: >60 mL/min (ref >60)
Glucose, Bld: 119 mg/dL — ABNORMAL HIGH (ref 70–99)
Potassium: 3.5 mmol/L (ref 3.5–5.1)
Sodium: 133 mmol/L — ABNORMAL LOW (ref 135–145)

## 2023-07-15 LAB — MAGNESIUM: Magnesium: 1.9 mg/dL (ref 1.7–2.4)

## 2023-07-15 MED ORDER — HEPARIN SOD (PORK) LOCK FLUSH 100 UNIT/ML IV SOLN
500.0000 [IU] | Freq: Once | INTRAVENOUS | Status: AC
  Administered 2023-07-15: 500 [IU]
  Filled 2023-07-15: qty 5

## 2023-07-15 MED ORDER — SODIUM CHLORIDE 0.9% FLUSH
10.0000 mL | INTRAVENOUS | Status: DC | PRN
  Administered 2023-07-15: 10 mL via INTRAVENOUS

## 2023-07-15 MED FILL — Fosaprepitant Dimeglumine For IV Infusion 150 MG (Base Eq): INTRAVENOUS | Qty: 5 | Status: AC

## 2023-07-15 NOTE — Assessment & Plan Note (Signed)
 Experiencing significant anxiety with symptoms of racing thoughts. No prior history of anxiolytic use. Alprazolam prescribed for short-term relief, with evaluation for long-acting medication if symptoms persist. - Prescribe alprazolam for short-term anxiety relief, to be taken up to twice a day as needed - Evaluate anxiety levels next week to consider long-acting anxiolytic if needed

## 2023-07-15 NOTE — Progress Notes (Unsigned)
 [facility]  ONCOLOGY CLINIC PROGRESS NOTE   Patient Care Team: [name], [name], MD as PCP - General (Family Medicine) Cherlyn Cushing, RN as Oncology Nurse Navigator Ernesto Rutherford, MD as Referring Physician (Ophthalmology) Marcine Matar, MD as Consulting Physician (Urology) Margaretmary Dys, MD as Consulting Physician (Radiation Oncology) Maryclare Labrador, RN as Registered Nurse Axel Filler, Larna Daughters, NP as Nurse Practitioner (Hematology and Oncology) Lonie Peak, MD as Attending Physician (Radiation Oncology) Ashok Croon, MD as Consulting Physician (Otolaryngology) Malmfelt, Lise Auer, RN as Oncology Nurse Navigator Meryl Crutch, MD as Consulting Physician (Oncology)  PATIENT NAME: Isaac Carpenter   MR#: [id number] DOB: 1950/08/31  Date of visit: 07/15/2023   ASSESSMENT & PLAN:   Isaac Carpenter is a 73 y.o. with a past medical history of prostate cancer diagnosed in March 2024, S/P radioactive seed implant/brachytherapy, hypertension, dyslipidemia, hypothyroidism, obstructive sleep apnea. He presented for follow up of recently diagnosed squamous cell carcinoma of the right tonsil, clinical stage II disease (cT3,cN2,cM0,p16+).   Malignant neoplasm of tonsillar fossa (HCC) Please review oncology history for additional details and timeline of events.  Symptoms include severe pain and dysphagia since mid-November, progressively worsening.   cT3,cN2,cM0,p16+ tumor, Stage II disease.   His case was discussed in tumor conference on 06/09/2023.  Given the extent of disease, consensus opinion is to proceed with concurrent chemoradiation.  He already had consultation with Dr. Basilio Cairo.  He is scheduled to begin radiation treatments from 07/12/2023.  We have discussed about role of cisplatin being a radiosensitizer in the treatment of head and neck cancer.  We have discussed about the curative intent of chemoradiation for this patient.     We have discussed about  mechanism of action of cisplatin, adverse effects of cisplatin including but not limited to fatigue, nausea, vomiting, increased risk of infections, mucositis, ototoxicity, nephrotoxicity, peripheral neuropathy.  Patient understands that some of the side effects can be permanent and even potentially fatal.  We have discussed about role of Mediport and G-tube for chemotherapy administration and nutrition respectively since most of these patients have severe mucositis during the treatment.  At this time we do not know if weekly cisplatin is inferior to every 21 days cisplatin since there is no head-to-head comparison trial.  I did mention to the patient however weekly cisplatin is well-tolerated with less adverse effects and most of the patients tend to complete treatment as planned.  Patient is willing to proceed with weekly cisplatin.  Patient completed chemo education.  He already had Port-A-Cath placed and feeding tube placed.  Labs today reveal unremarkable CBCD, BMP, mag.  Plan is to proceed with cycle 1 of cisplatin today at a dose of 40 mg/m.  Cisplatin will be continued weekly during the course of radiation.  - Ensure home hydration (80-90 oz non-caffeinated fluids daily) - Monitor and replace electrolytes as needed  RTC in 1 week for labs, follow-up and continuation of chemotherapy.  Dysphagia He has been having dysphagia.  Encouraged him to use feeding tube for adequate hydration and nutrition.  Continue to eat and drink as much as he can through the mouth to preserve muscle function.  We advised him to crush or dissolve tablets and take them via feeding tube, where needed.  Situational anxiety Experiencing significant anxiety with symptoms of racing thoughts. No prior history of anxiolytic use. Alprazolam prescribed for short-term relief, with evaluation for long-acting medication if symptoms persist. - Prescribe alprazolam for short-term anxiety relief, to be taken up  to twice a day  as needed - Evaluate anxiety levels next week to consider long-acting anxiolytic if needed    I reviewed lab results and outside records for this visit and discussed relevant results with the patient. Diagnosis, plan of care and treatment options were also discussed in detail with the patient. Opportunity provided to ask questions and answers provided to his apparent satisfaction. Provided instructions to call our clinic with any problems, questions or concerns prior to return visit. I recommended to continue follow-up with PCP and sub-specialists. He verbalized understanding and agreed with the plan.   NCCN guidelines have been consulted in the planning of this patient's care.  I spent a total of 40 minutes during this encounter with the patient including review of chart and various tests results, discussions about plan of care and coordination of care plan.   Meryl Crutch, MD  07/15/2023 4:12 PM  [facility] - A DEPT OF [facility] [address] [place] [address] Dept: [phone] Dept Fax: [phone]    CHIEF COMPLAINT/ REASON FOR VISIT:   Follow-up for squamous cell carcinoma of the right tonsil, stage II disease (cT3,cN2,cM0,p16+)  Current Treatment: Concurrent chemoradiation with weekly cisplatin started from 07/09/2023.  INTERVAL HISTORY:    Discussed the use of AI scribe software for clinical note transcription with the patient, who gave verbal consent to proceed.   Isaac Carpenter is here today for repeat clinical assessment.   Over the past 2 months, the patient has experienced significant weight loss, dropping from 301 lbs to 282 lbs.   The patient has a feeding tube in place, but it has not been used yet. The patient's ability to eat has fluctuated, with periods of good appetite followed by difficulty eating, likely due to the swelling. The patient is also experiencing anxiety, which is causing  racing thoughts and restlessness.  The patient has been prescribed dexamethasone, Zofran, and Compazine to manage potential nausea from chemotherapy. The patient also has oxycodone available for pain management, but has not needed to use it recently. The patient has been advised to use a mouthwash made from baking soda and salt to manage oral discomfort.  I have reviewed the past medical history, past surgical history, social history and family history with the patient and they are unchanged from previous note.  HISTORY OF PRESENT ILLNESS:   ONCOLOGY HISTORY:   He presented to his PCP Dr Betty [name] on 05-17-23 with complains of right-sided jaw/maxillary pain that tends to occur when coughing. Pain had persisted for several months at that time following an upper respiratory infection.    Subsequently, he underwent a CT soft tissue neck on 05-17-23 which revealed an ulcerated enhancing mass involving the superior oropharynx and soft palate/uvula measuring 3.4 x 3.0 x 3.6 cm in the greatest extent that is concerning for malignancy. No evidence of cervical lymphadenopathy was indicated on scan    In light of findings, the patient saw Dr. Ashok Croon on 05-27-23 with worsening symptoms. During his visit, he underwent a flexible fiberoptic laryngoscopy with several biopsies of the friable tissue from the right oropharyngeal mass.     Biopsy of right oropharynx mass on 05-27-23 revealed: Invasive moderately differentiated squamous cell carcinoma. Immunohistochemistry for p16 shows diffuse strong positivity.    During most recent visit with Dr. Irene Pap on 06-04-23, patient reported constant worsening symptoms of odynophagia and dysphagia. Stating that he's unable to maintain adequate nutrition  as food typically consists of few bites of soft food and liquids. Pain does not seem to be managed by pain relievers including oxycodone. To further evaluate the extent of the disease, he underwent a PET scan  performed on 06-07-23 revealing a right  oropharyngeal tumor >4cm, likely b/l level II adenopathy, no distant metastases.    cT3,cN2,cM0,p16+ tumor, Stage II disease.    Plan made for concurrent chemoradiation with weekly cisplatin. Started Cisplatin from 07/09/2023 and radiation from 07/12/23.   Oncology History  Malignant neoplasm of prostate (HCC)  05/13/2022 Cancer Staging   Staging form: Prostate, AJCC 8th Edition - Clinical stage from 05/13/2022: Stage IIC (cT1c, cN0, cM0, PSA: 13.2, Grade Group: 3) - Signed by Marcello Fennel, PA-C on 07/17/2022 Histopathologic type: Adenocarcinoma, NOS Stage prefix: Initial diagnosis Prostate specific antigen (PSA) range: 10 to 19 Gleason primary pattern: 4 Gleason secondary pattern: 3 Gleason score: 7 Histologic grading system: 5 grade system Number of biopsy cores examined: 12 Number of biopsy cores positive: 4 Location of positive needle core biopsies: Both sides   07/17/2022 Initial Diagnosis   Malignant neoplasm of prostate (HCC)   Malignant neoplasm of tonsillar fossa (HCC)  06/08/2023 Initial Diagnosis   Malignant neoplasm of tonsillar fossa (HCC)   06/08/2023 Cancer Staging   Staging form: Pharynx - HPV-Mediated Oropharynx, AJCC 8th Edition - Clinical stage from 06/08/2023: Stage II (cT3, cN2, cM0, p16+) - Signed by Lonie Peak, MD on 06/08/2023 Stage prefix: Initial diagnosis   07/09/2023 -  Chemotherapy   Patient is on Treatment Plan : HEAD/NECK Cisplatin (40) q7d         REVIEW OF SYSTEMS:   Review of Systems - Oncology  All other pertinent systems were reviewed with the patient and are negative.  ALLERGIES: He is allergic to testosterone.  MEDICATIONS:  Current Outpatient Medications  Medication Sig Dispense Refill   acetaminophen (TYLENOL) 500 MG tablet Take 1 tablet (500 mg total) by mouth every 6 (six) hours. Please take every 6 hrs and stagger with Motrin 3 hrs apart from each other. Please do not exceed maximum daily  dose to avoid liver damage 30 tablet 1   ALPRAZolam (XANAX) 1 MG tablet Take 0.5 tablets (0.5 mg total) by mouth 2 (two) times daily as needed for anxiety. 60 tablet 0   aspirin EC 81 MG tablet Take 81 mg by mouth daily. Swallow whole.     atorvastatin (LIPITOR) 20 MG tablet Take 1 tablet (20 mg total) by mouth daily. 90 tablet 3   baclofen (LIORESAL) 10 MG tablet Take 0.5-1 tablets (5-10 mg total) by mouth at bedtime as needed for muscle spasms. 10 each 0   dexamethasone (DECADRON) 4 MG tablet Take 2 tablets (8 mg) by mouth daily x 3 days starting the day after cisplatin chemotherapy. Take with food. (Patient not taking: Reported on 07/09/2023) 30 tablet 1   doxazosin (CARDURA) 1 MG tablet Take 1 tablet (1 mg total) by mouth at bedtime. 30 tablet 1   ibuprofen (ADVIL) 600 MG tablet Take 1 tablet (600 mg total) by mouth every 6 (six) hours. Please take every 6 hrs, and stagger this medication 3 hrs apart from Tylenol 30 tablet 0   levothyroxine (SYNTHROID) 88 MCG tablet TAKE ONE TABLET BY MOUTH DAILY. 90 tablet 2   lidocaine (XYLOCAINE) 2 % solution Use as directed 15 mLs in the mouth or throat every 4 (four) hours as needed for mouth pain. (Patient not taking: Reported on 07/09/2023) 100 mL 1  lidocaine-prilocaine (EMLA) cream Apply to affected area once 30 g 3   lisinopril-hydrochlorothiazide (ZESTORETIC) 20-25 MG tablet Take 1 tablet by mouth once daily. 90 tablet 2   magic mouthwash SOLN TAKE BY MOUTH THREE TIMES DAILY AS NEEDED FOR UP TO 10 DAYS FOR MOUTH PAIN (Patient not taking: Reported on 07/09/2023)     Nutritional Supplements (NUTREN 1.5) LIQD 2 cartons Nutren 1.5 (500 ml) QID via tube. Flush with 60 ml water before/after each bolus. Provide additional 250 ml water flush 4x/day in between feedings to meet hydration needs. Provides 3000 kcal, 136 g protein, 1528 ml free water (3008 ml total water) 2000 ml/day meets 100% DRI     ondansetron (ZOFRAN) 8 MG tablet Take 1 tablet (8 mg total) by  mouth every 8 (eight) hours as needed for nausea or vomiting. Start on the third day after cisplatin. (Patient not taking: Reported on 07/09/2023) 30 tablet 1   oxyCODONE 10 MG TABS Take 1 tablet (10 mg total) by mouth every 6 (six) hours as needed for severe pain (pain score 7-10). Use 1 tab Q4hrs for pain and if continue to have severe pain ok to take 2 tabs Q4hrs (Patient not taking: Reported on 07/09/2023) 90 tablet 0   prochlorperazine (COMPAZINE) 10 MG tablet Take 1 tablet (10 mg total) by mouth every 6 (six) hours as needed (Nausea or vomiting). (Patient not taking: Reported on 07/09/2023) 30 tablet 1   solifenacin (VESICARE) 5 MG tablet Take 5 mg by mouth daily.     No current facility-administered medications for this visit.     VITALS:   Blood pressure 104/65, pulse 83, temperature 97.6 F (36.4 C), temperature source Temporal, resp. rate 18, height 6\' 1"  (1.854 m), weight 273 lb 1.6 oz (123.9 kg), SpO2 97%.  Wt Readings from Last 3 Encounters:  07/15/23 273 lb 1.6 oz (123.9 kg)  07/09/23 282 lb (127.9 kg)  07/05/23 280 lb (127 kg)    Body mass index is 36.03 kg/m.      PHYSICAL EXAM:   Physical Exam Constitutional:      General: He is not in acute distress.    Appearance: Normal appearance.  HENT:     Head: Normocephalic and atraumatic.     Mouth/Throat:     Comments: Right oropharynx tonsil tumor eroding through soft palate, tumor reaches uvula, uvula swollen Eyes:     General: No scleral icterus.    Conjunctiva/sclera: Conjunctivae normal.  Cardiovascular:     Rate and Rhythm: Normal rate and regular rhythm.     Heart sounds: Normal heart sounds.  Pulmonary:     Effort: Pulmonary effort is normal.     Breath sounds: Normal breath sounds.  Chest:     Comments: Right sided Port-A-Cath on place without any signs of infection Abdominal:     General: There is no distension.     Comments: Feeding tube in place  Musculoskeletal:     Right lower leg: No edema.      Left lower leg: No edema.  Neurological:     General: No focal deficit present.     Mental Status: He is alert and oriented to person, place, and time.  Psychiatric:        Mood and Affect: Mood normal.        Behavior: Behavior normal.        Thought Content: Thought content normal.      LABORATORY DATA:   I have reviewed the data as listed.  Results for orders placed or performed in visit on 07/15/23  Rad Onc Aria Session Summary  Result Value Ref Range   Course ID [id number]    Course Start Date 07/05/2023    Session Number 4    Course First Treatment Date 07/12/2023  8:47 AM    Course Last Treatment Date 07/15/2023  3:00 PM    Course Elapsed Days 3    Reference Point ID [id number]    Reference Point Dosage Given to Date 8.00000004 Gy   Reference Point Session Dosage Given 2.00000001 Gy   Plan ID [email or web address]    Plan Fractions Treated to Date 4    Plan Total Fractions Prescribed 35    Plan Prescribed Dose Per Fraction 2 Gy   Plan Total Prescribed Dose 70.000000 Gy   Plan Primary Reference Point HN dp   Results for orders placed or performed in visit on 07/15/23  CBC with Differential (Cancer Center Only)  Result Value Ref Range   WBC Count 8.4 4.0 - 10.5 K/uL   RBC 4.64 4.22 - 5.81 MIL/uL   Hemoglobin 14.3 13.0 - 17.0 g/dL   HCT 16.1 09.6 - 04.5 %   MCV 91.2 80.0 - 100.0 fL   MCH 30.8 26.0 - 34.0 pg   MCHC 33.8 30.0 - 36.0 g/dL   RDW 40.9 81.1 - 91.4 %   Platelet Count 245 150 - 400 K/uL   nRBC 0.0 0.0 - 0.2 %   Neutrophils Relative % 71 %   Neutro Abs 6.0 1.7 - 7.7 K/uL   Lymphocytes Relative 15 %   Lymphs Abs 1.2 0.7 - 4.0 K/uL   Monocytes Relative 10 %   Monocytes Absolute 0.8 0.1 - 1.0 K/uL   Eosinophils Relative 3 %   Eosinophils Absolute 0.2 0.0 - 0.5 K/uL   Basophils Relative 0 %   Basophils Absolute 0.0 0.0 - 0.1 K/uL   Immature Granulocytes 1 %   Abs Immature Granulocytes 0.07 0.00 - 0.07 K/uL    RADIOGRAPHIC STUDIES:  I have personally reviewed the  radiological images as listed and agree with the findings in the report.  DG SWALLOW FUNC OP MEDICARE SPEECH PATH Result Date: 06/28/2023 Table formatting from the original result was not included. Modified Barium Swallow Study Patient Details Name: HAO DION MRN: [id number] Date of Birth: [birth year] Today's Date: 06/28/2023 HPI/PMH: HPI: Hridhaan Yohn is a 73 y.o. male with PMH: HLD, HTN, OSA, recently diagnosed with stage II SCC of right tonsil with plans for concurrent chemoradiation. He had PEG placed in IR on 06/18/23. He presented to this OP MBS for evaluation of baseline swallow function and to determine aspiration risk. Patient reported pain with swallowing since bipsy of oropharyngeal mass on 05/27/23, however it has improved since then and he is able to eat and drink. Clinical Impression: Clinical Impression: Patient presents with an oropharyngeal swallow that is largely [facility]. No penetration or aspiration occured during any phase of the swallow with any of the tested liquid or solid consistencies. Anterior hyoid excursion appeared partial in completion but patient with full epiglottic inversion and laryngeal vestibule closure. With solids, he did exhibit increased amount of vallecular residuals but with clearance with subsequent swallows and sips of liquids. PES opening appeared [facility] and no retrograde flow of barium observed in upper esophagus. Trace amount of barium residue remained in oropharynx at level of known mass, however barium fully cleared with subsequent swallows. Barium tablet transit appeared to stall in distal esophagus.  SLP recommends referral to OP SLP secondary to planned oropharyngeal chemoradiation treatment. Recommendations/Plan: Swallowing Evaluation Recommendations Swallowing Evaluation Recommendations Recommendations: PO diet PO Diet Recommendation: Regular; Thin liquids (Level 0) Liquid Administration via: Cup; Straw Medication Administration: Whole meds with liquid Supervision:  Patient able to self-feed Swallowing strategies  : Follow solids with liquids Postural changes: Position pt fully upright for meals Oral care recommendations: Oral care BID (2x/day) Treatment Plan Treatment Plan Follow-up recommendations: Outpatient SLP Recommendations Recommendations for follow up therapy are one component of a multi-disciplinary discharge planning process, led by the attending physician.  Recommendations may be updated based on patient status, additional functional criteria and insurance authorization. Assessment: Orofacial Exam: Orofacial Exam Oral Cavity: Oral Hygiene: WFL Oral Cavity - Dentition: Adequate natural dentition Orofacial Anatomy: Other (comment) Oral Motor/Sensory Function: WFL Anatomy: Anatomy: Prominent cricopharyngeus Boluses Administered: Boluses Administered Boluses Administered: Thin liquids (Level 0); Mildly thick liquids (Level 2, nectar thick); Moderately thick liquids (Level 3, honey thick); Puree; Solid  Oral Impairment Domain: Oral Impairment Domain Lip Closure: No labial escape Tongue control during bolus hold: Cohesive bolus between tongue to palatal seal Bolus preparation/mastication: Timely and efficient chewing and mashing Bolus transport/lingual motion: Brisk tongue motion Oral residue: Complete oral clearance Location of oral residue : N/A Initiation of pharyngeal swallow : Posterior laryngeal surface of the epiglottis  Pharyngeal Impairment Domain: Pharyngeal Impairment Domain Soft palate elevation: No bolus between soft palate (SP)/pharyngeal wall (PW) Laryngeal elevation: Complete superior movement of thyroid cartilage with complete approximation of arytenoids to epiglottic petiole Anterior hyoid excursion: Partial anterior movement Epiglottic movement: Complete inversion Laryngeal vestibule closure: Complete, no air/contrast in laryngeal vestibule Pharyngeal stripping wave : Present - complete Pharyngeal contraction (A/P view only): N/A Pharyngoesophageal  segment opening: Complete distension and complete duration, no obstruction of flow Tongue base retraction: No contrast between tongue base and posterior pharyngeal wall (PPW) Pharyngeal residue: Collection of residue within or on pharyngeal structures Location of pharyngeal residue: Valleculae; Pharyngeal wall; Tongue base  Esophageal Impairment Domain: Esophageal Impairment Domain Esophageal clearance upright position: Complete clearance, esophageal coating Pill: Pill Consistency administered: Thin liquids (Level 0) Thin liquids (Level 0): Impaired (see clinical impressions) Penetration/Aspiration Scale Score: Penetration/Aspiration Scale Score 1.  Material does not enter airway: Thin liquids (Level 0); Mildly thick liquids (Level 2, nectar thick); Moderately thick liquids (Level 3, honey thick); Puree; Solid; Pill Compensatory Strategies: Compensatory Strategies Compensatory strategies: No   General Information: No data recorded Diet Prior to this Study: Regular; Thin liquids (Level 0)   No data recorded  Respiratory Status: WFL   Supplemental O2: None (Room air)   History of Recent Intubation: No  Behavior/Cognition: Alert; Cooperative; Pleasant mood Self-Feeding Abilities: Able to self-feed Baseline vocal quality/speech: Normal Volitional Cough: Able to elicit Volitional Swallow: Able to elicit Exam Limitations: No limitations Goal Planning: Consulted and agree with results and recommendations: Patient Pain: No data recorded End of Session: Start Time:SLP Start Time (ACUTE ONLY): 1325 Stop Time: SLP Stop Time (ACUTE ONLY): 1345 Time Calculation:SLP Time Calculation (min) (ACUTE ONLY): 20 min Charges: SLP Evaluations $ SLP Speech Visit: 1 Visit SLP Evaluations $Outpatient MBS Swallow: 1 Procedure SLP visit diagnosis: SLP Visit Diagnosis: Dysphagia, oropharyngeal phase (R13.12) Past Medical History: Past Medical History: Diagnosis Date  Arthritis   Cancer (HCC)   History of partial thyroidectomy STATES OVERACTIVE  THYROID-- NO ISSUES SINCE AGE 52 AND NO MEDS  Hyperlipidemia   Hypertension   Hypothyroidism   Sleep apnea   Per patient he tried CPAP but could  not tolerate.  Thyroid disease  Past Surgical History: Past Surgical History: Procedure Laterality Date  CIRCUMCISION  01/04/2012  Procedure: CIRCUMCISION ADULT;  Surgeon: Kathi Ludwig, MD;  Location[facility];  Service: Urology;  Laterality: N/A;  COLONOSCOPY  04/08/2020  Vito Cirigliano  CYSTOSCOPY  09/04/2022  Procedure: CYSTOSCOPY;  Surgeon: Marcine Matar, MD;  Location: [facility];  Service: Urology;;  IR GASTROSTOMY TUBE MOD SED  06/18/2023  IR IMAGING GUIDED PORT INSERTION  06/18/2023  RADIOACTIVE SEED IMPLANT N/A 09/04/2022  Procedure: RADIOACTIVE SEED IMPLANT/BRACHYTHERAPY IMPLANT;  Surgeon: Marcine Matar, MD;  Location: [facility];  Service: Urology;  Laterality: N/A;  90 MINS  SPACE OAR INSTILLATION N/A 09/04/2022  Procedure: SPACE OAR INSTILLATION;  Surgeon: Marcine Matar, MD;  Location: [facility];  Service: Urology;  Laterality: N/A;  THYROIDECTOMY, PARTIAL  AGE 65  OVERACTIVE THYROID Angela Nevin, MA, CCC-SLP Speech Therapy CLINICAL DATA:  Dysphagia. Cough/GE reflux disease/other secondary diagnosis EXAM: MODIFIED BARIUM SWALLOW TECHNIQUE: Radiologist in attendance for the exam. Different consistencies of barium were administered orally to the patient by the Speech Pathologist. Imaging of the pharynx was performed in the lateral projection. The radiologist was present in the fluoroscopy room for this study, providing personal supervision. FLUOROSCOPY: Radiation Exposure Index (as provided by the fluoroscopic device): 5.6 mGy Kerma COMPARISON:  None Available. FINDINGS: Vestibular  Penetration:  None seen. Aspiration:  None seen. Other:  None. IMPRESSION: No aspiration. Please refer to the Speech Pathologists report for complete details and recommendations. Electronically  Signed   By: Leanna Battles M.D.   On: 06/28/2023 14:06  IR GASTROSTOMY TUBE MOD SED Result Date: 06/18/2023 INDICATION: Malignant neoplasm of the tonsillar fossa. Gastrostomy tube requested for supplemental nutrition. EXAM: PERCUTANEOUS GASTROSTOMY TUBE WITH FLUOROSCOPIC GUIDANCE Physician: Rachelle Hora. Lowella Dandy, MD MEDICATIONS: Ancef 2 g; Antibiotics were administered within 1 hour of the procedure. Glucagon 1 mg IV ANESTHESIA/SEDATION: Moderate (conscious) sedation was employed during this procedure. A total of Versed 2mg  and fentanyl 75 mcg was administered intravenously at the order of the provider performing the procedure. Total intra-service moderate sedation time: 38 minutes. Patient's level of consciousness and vital signs were monitored continuously by radiology nurse throughout the procedure under the supervision of the provider performing the procedure. FLUOROSCOPY: Radiation Exposure Index (as provided by the fluoroscopic device): 73.5 mGy Kerma COMPLICATIONS: None immediate. PROCEDURE: The procedure was explained to the patient. The risks and benefits of the procedure were discussed and the patient's questions were addressed. Informed consent was obtained from the patient. This procedure was performed immediately following placement of the subcutaneous Port-A-Cath. Nasogastric tube was placed with fluoroscopy. Transverse colon was visible with fluoroscopy. The anterior abdomen was prepped and draped in sterile fashion. Maximal barrier sterile technique was utilized including caps, mask, sterile gowns, sterile gloves, sterile drape, hand hygiene and skin antiseptic. Stomach was inflated with air through the orogastric tube. The skin and subcutaneous tissues were anesthetized with 1% lidocaine. Using fluoroscopic guidance, a Saf-T-Pexy T fastener was placed within the stomach. A second Saf-T-Pexy T fastener was placed using fluoroscopy. An incision was made between the T-fasteners. Needle was directed through  this incision into the stomach using fluoroscopic guidance. Contrast injection confirmed placement in the stomach. Wire was placed in the stomach. The tract was dilated to accommodate a 20 French peel-away sheath. An 39 French Entuit gastrostomy tube was advanced over wire into the stomach. The balloon was inflated with 10 mL of saline. Peel-away sheath  was removed. Contrast injection confirmed placement in the stomach. Fluoroscopic images were obtained for documentation. The gastrostomy tube was flushed with normal saline. IMPRESSION: Successful fluoroscopic guided percutaneous gastrostomy tube placement. Plan for removal of the T-fasteners in approximately 2 weeks. Electronically Signed   By: Richarda Overlie M.D.   On: 06/18/2023 14:10   IR IMAGING GUIDED PORT INSERTION Result Date: 06/18/2023 INDICATION: Malignant neoplasm of tonsillar fossa. Port-A-Cath needed for treatment. EXAM: FLUOROSCOPIC AND ULTRASOUND GUIDED PLACEMENT OF A SUBCUTANEOUS PORT COMPARISON:  None Available. MEDICATIONS: Moderate sedation ANESTHESIA/SEDATION: Moderate (conscious) sedation was employed during this procedure. A total of Versed 1 mg and fentanyl 75 mcg was administered intravenously at the order of the provider performing the procedure. Total intra-service moderate sedation time: 25 minutes. Patient's level of consciousness and vital signs were monitored continuously by radiology nurse throughout the procedure under the supervision of the provider performing the procedure. FLUOROSCOPY TIME:  Radiation Exposure Index (as provided by the fluoroscopic device): 6 mGy Kerma COMPLICATIONS: None immediate. PROCEDURE: The procedure, risks, benefits, and alternatives were explained to the patient. Questions regarding the procedure were encouraged and answered. The patient understands and consents to the procedure. Patient was placed supine on the interventional table. Ultrasound confirmed a patent right internal jugular vein. Ultrasound  image was saved for documentation. The right chest and neck were cleaned with a skin antiseptic and a sterile drape was placed. Maximal barrier sterile technique was utilized including caps, mask, sterile gowns, sterile gloves, sterile drape, hand hygiene and skin antiseptic. The right neck was anesthetized with 1% lidocaine. Small incision was made in the right neck with a blade. Micropuncture set was placed in the right internal jugular vein with ultrasound guidance. The micropuncture wire was used for measurement purposes. The right chest was anesthetized with 1% lidocaine with epinephrine. #15 blade was used to make an incision and a subcutaneous port pocket was formed. 8 french Power Port was assembled. Subcutaneous tunnel was formed with a stiff tunneling device. The port catheter was brought through the subcutaneous tunnel. The port was placed in the subcutaneous pocket. The micropuncture set was exchanged for a peel-away sheath. The catheter was placed through the peel-away sheath and the tip was positioned at the superior cavoatrial junction. Catheter placement was confirmed with fluoroscopy. The port was accessed and flushed with heparinized saline. The port pocket was closed using two layers of absorbable sutures and Dermabond. The vein skin site was closed using a single layer of absorbable suture and Dermabond. Sterile dressings were applied. Patient tolerated the procedure well without an immediate complication. Ultrasound and fluoroscopic images were taken and saved for this procedure. IMPRESSION: Placement of a subcutaneous power-injectable port device. Catheter tip at the superior cavoatrial junction. Electronically Signed   By: Richarda Overlie M.D.   On: 06/18/2023 13:56    CODE STATUS:  Code Status History     Date Active Date Inactive Code Status Order ID Comments User Context   06/18/2023 1400 06/19/2023 0508 Full Code [id number]  Richarda Overlie, MD HOV    Questions for Most Recent Historical Code  Status (Order [id number])     Question Answer   By: Consent: discussion documented in EHR            No orders of the defined types were placed in this encounter.    Future Appointments  Date Time Provider Department Center  07/16/2023  7:30 AM [facility] INFUSION [facility] None  07/16/2023  9:45 AM Noreene Larsson, RD [facility] None  07/16/2023  2:15 PM [facility] [id number] [facility] None  07/19/2023  8:30 AM [facility] LINAC 4 [facility] None  07/19/2023  8:45 AM LINAC-SQUIRE [facility] None  07/20/2023  8:30 AM [facility] LINAC 4 [facility] None  07/21/2023  8:30 AM [facility] LINAC 4 [facility] None  07/22/2023  9:45 AM [facility] LINAC 4 [facility] None  07/22/2023 10:15 AM [facility] FLUSH [facility] None  07/22/2023 10:45 AM Ronee Ranganathan, MD [facility] None  07/22/2023 11:30 AM Schinke, Carl B, CCC-SLP [facility]  07/23/2023  9:30 AM [facility] LINAC 4 [facility] None  07/23/2023 10:00 AM [facility] INFUSION [facility] None  07/26/2023  8:00 AM [facility] LINAC 4 [facility] None  07/27/2023  8:30 AM [facility] LINAC 4 [facility] None  07/28/2023  8:30 AM [facility] LINAC 4 [facility] None  07/29/2023  8:30 AM [facility] LINAC 4 [facility] None  07/30/2023  7:30 AM [facility] [id number] [facility] None  07/30/2023  8:15 AM [facility] FLUSH [facility] None  07/30/2023  8:45 AM Babita Amaker, MD [facility] None  07/30/2023 10:00 AM [facility] INFUSION [facility] None  08/02/2023  8:30 AM [facility] LINAC 4 [facility] None  08/03/2023  8:30 AM [facility] LINAC 4 [facility] None  08/04/2023  8:30 AM [facility] LINAC 4 [facility] None  08/05/2023 10:15 AM [facility] LINAC 4 [facility] None  08/05/2023 10:45 AM [facility] FLUSH [facility] None  08/05/2023 11:15 AM Jahki Witham, MD [facility] None  08/06/2023  7:15 AM [facility] LINAC 4 [facility] None  08/06/2023  7:30 AM [facility] INFUSION [facility] None  08/09/2023  8:30 AM [facility] LINAC 4  [facility] None  08/10/2023  8:30 AM [facility] LINAC 4 [facility] None  08/11/2023  8:30 AM [facility] LINAC 4 [facility] None  08/12/2023  8:30 AM [facility] LINAC 4 [facility] None  08/13/2023  7:45 AM [facility] LINAC 3 [facility] None  08/13/2023  8:15 AM [facility] FLUSH [facility] None  08/13/2023  8:45 AM Justis Dupas, MD [facility] None  08/13/2023 10:00 AM [facility] INFUSION [facility] None  08/13/2023 10:30 AM Abdul, Nadiyah, LCSW [facility] None  08/16/2023  8:30 AM [facility] LINAC 4 [facility] None  08/17/2023  8:30 AM [facility] LINAC 4 [facility] None  08/18/2023  8:30 AM [facility] LINAC 4 [facility] None  08/19/2023  8:30 AM [facility] LINAC 4 [facility] None  08/20/2023  7:45 AM [facility] LINAC 3 [facility] None  08/20/2023  8:00 AM [facility] FLUSH [facility] None  08/20/2023  8:30 AM Talmadge Ganas, MD [facility] None  08/20/2023  9:30 AM [facility] INFUSION [facility] None  08/23/2023  8:30 AM [facility] LINAC 4 [facility] None  08/24/2023  8:30 AM [facility] LINAC 4 [facility] None  08/25/2023  8:30 AM [facility] LINAC 4 [facility] None  08/26/2023  8:30 AM [facility] LINAC 4 [facility] None  08/27/2023  8:30 AM [facility] LINAC 4 [facility] None     This document was completed utilizing speech recognition software. Grammatical errors, random word insertions, pronoun errors, and incomplete sentences are an occasional consequence of this system due to software limitations, ambient noise, and hardware issues. Any formal questions or concerns about the content, text or information contained within the body of this dictation should be directly addressed to the provider for clarification.

## 2023-07-15 NOTE — ED Triage Notes (Signed)
 Pt had radiation done earlier today for throat cancer through his port. Port was not deaccessed prior to being discharged. Pt instructed to come to ER to have port deaccessed.

## 2023-07-15 NOTE — Assessment & Plan Note (Signed)
 He has been having dysphagia.  Encouraged him to use feeding tube for adequate hydration and nutrition.  Continue to eat and drink as much as he can through the mouth to preserve muscle function.  We advised him to crush or dissolve tablets and take them via feeding tube, where needed.

## 2023-07-15 NOTE — ED Provider Notes (Signed)
 [place] EMERGENCY DEPARTMENT AT [facility] Provider Note   CSN: [id number] Arrival date & time: 07/15/23  2334     History  Chief Complaint  Patient presents with   Vascular Access Problem    Isaac Carpenter is a 73 y.o. male.  Patient was instructed to come to the emergency department after it was discovered that he was discharged from cancer center earlier today after treatment with his port accidentally still accessed.       Home Medications Prior to Admission medications   Medication Sig Start Date End Date Taking? Authorizing Provider  acetaminophen (TYLENOL) 500 MG tablet Take 1 tablet (500 mg total) by mouth every 6 (six) hours. Please take every 6 hrs and stagger with Motrin 3 hrs apart from each other. Please do not exceed maximum daily dose to avoid liver damage 05/27/23   Ashok Croon, MD  ALPRAZolam Prudy Feeler) 1 MG tablet Take 0.5 tablets (0.5 mg total) by mouth 2 (two) times daily as needed for anxiety. 07/09/23   Pasam, Archie Patten, MD  aspirin EC 81 MG tablet Take 81 mg by mouth daily. Swallow whole.    [provider]  atorvastatin (LIPITOR) 20 MG tablet Take 1 tablet (20 mg total) by mouth daily. 05/17/23   [name], [name], MD  baclofen (LIORESAL) 10 MG tablet Take 0.5-1 tablets (5-10 mg total) by mouth at bedtime as needed for muscle spasms. 06/21/23   Pollyann Samples, NP  dexamethasone (DECADRON) 4 MG tablet Take 2 tablets (8 mg) by mouth daily x 3 days starting the day after cisplatin chemotherapy. Take with food. Patient not taking: Reported on 07/09/2023 06/11/23   Pasam, Archie Patten, MD  doxazosin (CARDURA) 1 MG tablet Take 1 tablet (1 mg total) by mouth at bedtime. 07/09/23   Pasam, Archie Patten, MD  ibuprofen (ADVIL) 600 MG tablet Take 1 tablet (600 mg total) by mouth every 6 (six) hours. Please take every 6 hrs, and stagger this medication 3 hrs apart from Tylenol 05/27/23   Ashok Croon, MD  levothyroxine (SYNTHROID) 88 MCG tablet TAKE ONE TABLET BY  MOUTH DAILY. 07/12/23   [name], [name], MD  lidocaine (XYLOCAINE) 2 % solution Use as directed 15 mLs in the mouth or throat every 4 (four) hours as needed for mouth pain. Patient not taking: Reported on 07/09/2023 06/04/23   Ashok Croon, MD  lidocaine-prilocaine (EMLA) cream Apply to affected area once 06/11/23   Pasam, Avinash, MD  lisinopril-hydrochlorothiazide (ZESTORETIC) 20-25 MG tablet Take 1 tablet by mouth once daily. 07/12/23   [name], [name], MD  magic mouthwash SOLN TAKE BY MOUTH THREE TIMES DAILY AS NEEDED FOR UP TO 10 DAYS FOR MOUTH PAIN Patient not taking: Reported on 07/09/2023 05/21/23   [provider]  Nutritional Supplements (NUTREN 1.5) LIQD 2 cartons Nutren 1.5 (500 ml) QID via tube. Flush with 60 ml water before/after each bolus. Provide additional 250 ml water flush 4x/day in between feedings to meet hydration needs. Provides 3000 kcal, 136 g protein, 1528 ml free water (3008 ml total water) 2000 ml/day meets 100% DRI 07/09/23   Lonie Peak, MD  ondansetron (ZOFRAN) 8 MG tablet Take 1 tablet (8 mg total) by mouth every 8 (eight) hours as needed for nausea or vomiting. Start on the third day after cisplatin. Patient not taking: Reported on 07/09/2023 06/11/23   Pasam, Archie Patten, MD  oxyCODONE 10 MG TABS Take 1 tablet (10 mg total) by mouth every 6 (six) hours as needed for severe  pain (pain score 7-10). Use 1 tab Q4hrs for pain and if continue to have severe pain ok to take 2 tabs Q4hrs Patient not taking: Reported on 07/09/2023 06/10/23   Pasam, Archie Patten, MD  prochlorperazine (COMPAZINE) 10 MG tablet Take 1 tablet (10 mg total) by mouth every 6 (six) hours as needed (Nausea or vomiting). Patient not taking: Reported on 07/09/2023 06/11/23   Pasam, Archie Patten, MD  solifenacin (VESICARE) 5 MG tablet Take 5 mg by mouth daily. 10/12/22   [provider]      Allergies    Testosterone    Review of Systems   Review of Systems  Physical Exam Updated Vital  Signs Pulse 95   Temp 97.6 F (36.4 C) (Oral)   Resp 18   Ht 6\' 1"  (1.854 m)   Wt 123.9 kg   SpO2 98%   BMI 36.03 kg/m  Physical Exam Vitals and nursing note reviewed.  Constitutional:      Appearance: Normal appearance.  HENT:     Head: Normocephalic.  Cardiovascular:     Rate and Rhythm: Normal rate.  Pulmonary:     Effort: Pulmonary effort is normal.  Neurological:     General: No focal deficit present.     Mental Status: He is alert.     ED Results / Procedures / Treatments   Labs (all labs ordered are listed, but only abnormal results are displayed) Labs Reviewed - No data to display  EKG None  Radiology No results found.  Procedures Procedures    Medications Ordered in ED Medications  heparin lock flush 100 unit/mL (has no administration in time range)    ED Course/ Medical Decision Making/ A&P                                 Medical Decision Making Risk Prescription drug management.   Port appropriately deaccessed by nursing staff.        Final Clinical Impression(s) / ED Diagnoses Final diagnoses:  Port-A-Cath in place    Rx / DC Orders ED Discharge Orders     None         Clydette Privitera, Canary Brim, MD 07/15/23 [phone]

## 2023-07-15 NOTE — Patient Instructions (Signed)

## 2023-07-15 NOTE — Assessment & Plan Note (Signed)
 Please review oncology history for additional details and timeline of events.  Symptoms include severe pain and dysphagia since mid-November, progressively worsening.   cT3,cN2,cM0,p16+ tumor, Stage II disease.   His case was discussed in tumor conference on 06/09/2023.  Given the extent of disease, consensus opinion is to proceed with concurrent chemoradiation.  He already had consultation with Dr. Basilio Cairo.  He is scheduled to begin radiation treatments from 07/12/2023.  We have discussed about role of cisplatin being a radiosensitizer in the treatment of head and neck cancer.  We have discussed about the curative intent of chemoradiation for this patient.     We have discussed about mechanism of action of cisplatin, adverse effects of cisplatin including but not limited to fatigue, nausea, vomiting, increased risk of infections, mucositis, ototoxicity, nephrotoxicity, peripheral neuropathy.  Patient understands that some of the side effects can be permanent and even potentially fatal.  We have discussed about role of Mediport and G-tube for chemotherapy administration and nutrition respectively since most of these patients have severe mucositis during the treatment.  At this time we do not know if weekly cisplatin is inferior to every 21 days cisplatin since there is no head-to-head comparison trial.  I did mention to the patient however weekly cisplatin is well-tolerated with less adverse effects and most of the patients tend to complete treatment as planned.  Patient is willing to proceed with weekly cisplatin.  Patient completed chemo education.  He already had Port-A-Cath placed and feeding tube placed.  Labs today reveal unremarkable CBCD, BMP, mag.  Plan is to proceed with cycle 1 of cisplatin today at a dose of 40 mg/m.  Cisplatin will be continued weekly during the course of radiation.  - Ensure home hydration (80-90 oz non-caffeinated fluids daily) - Monitor and replace electrolytes as  needed  RTC in 1 week for labs, follow-up and continuation of chemotherapy.

## 2023-07-16 ENCOUNTER — Encounter: Payer: Self-pay | Admitting: Oncology

## 2023-07-16 ENCOUNTER — Ambulatory Visit: Payer: Medicare Other

## 2023-07-16 ENCOUNTER — Encounter: Payer: Self-pay | Admitting: Radiation Oncology

## 2023-07-16 ENCOUNTER — Inpatient Hospital Stay: Payer: Medicare Other

## 2023-07-16 ENCOUNTER — Other Ambulatory Visit: Payer: Self-pay

## 2023-07-16 ENCOUNTER — Inpatient Hospital Stay: Payer: Medicare Other | Admitting: Dietician

## 2023-07-16 ENCOUNTER — Ambulatory Visit
Admission: RE | Admit: 2023-07-16 | Discharge: 2023-07-16 | Disposition: A | Discharge status: Home / Self Care | Source: Ambulatory Visit | Attending: Radiation Oncology | Admitting: Radiation Oncology

## 2023-07-16 VITALS — BP 120/57 | HR 58 | Temp 98.0°F | Resp 18

## 2023-07-16 DIAGNOSIS — Z79891 Long term (current) use of opiate analgesic: Secondary | ICD-10-CM | POA: Diagnosis not present

## 2023-07-16 DIAGNOSIS — Z79899 Other long term (current) drug therapy: Secondary | ICD-10-CM | POA: Diagnosis not present

## 2023-07-16 DIAGNOSIS — R1310 Dysphagia, unspecified: Secondary | ICD-10-CM | POA: Diagnosis not present

## 2023-07-16 DIAGNOSIS — C090 Malignant neoplasm of tonsillar fossa: Secondary | ICD-10-CM

## 2023-07-16 DIAGNOSIS — Z923 Personal history of irradiation: Secondary | ICD-10-CM | POA: Diagnosis not present

## 2023-07-16 DIAGNOSIS — Z87891 Personal history of nicotine dependence: Secondary | ICD-10-CM | POA: Diagnosis not present

## 2023-07-16 DIAGNOSIS — Z931 Gastrostomy status: Secondary | ICD-10-CM | POA: Diagnosis not present

## 2023-07-16 DIAGNOSIS — M546 Pain in thoracic spine: Secondary | ICD-10-CM | POA: Diagnosis not present

## 2023-07-16 DIAGNOSIS — Z510 Encounter for antineoplastic radiation therapy: Secondary | ICD-10-CM | POA: Diagnosis not present

## 2023-07-16 DIAGNOSIS — Z95828 Presence of other vascular implants and grafts: Secondary | ICD-10-CM | POA: Diagnosis not present

## 2023-07-16 DIAGNOSIS — Z5111 Encounter for antineoplastic chemotherapy: Secondary | ICD-10-CM | POA: Diagnosis not present

## 2023-07-16 DIAGNOSIS — G893 Neoplasm related pain (acute) (chronic): Secondary | ICD-10-CM | POA: Diagnosis not present

## 2023-07-16 DIAGNOSIS — R5383 Other fatigue: Secondary | ICD-10-CM | POA: Insufficient documentation

## 2023-07-16 DIAGNOSIS — Z7982 Long term (current) use of aspirin: Secondary | ICD-10-CM | POA: Diagnosis not present

## 2023-07-16 LAB — RAD ONC ARIA SESSION SUMMARY
Course Elapsed Days: 4
Plan Fractions Treated to Date: 5
Plan Prescribed Dose Per Fraction: 2 Gy
Plan Total Fractions Prescribed: 35
Plan Total Prescribed Dose: 70 Gy
Reference Point Dosage Given to Date: 10 Gy
Reference Point Session Dosage Given: 2 Gy
Session Number: 5

## 2023-07-16 LAB — URINALYSIS, ROUTINE W REFLEX MICROSCOPIC
Bilirubin Urine: NEGATIVE
Glucose, UA: NEGATIVE mg/dL
Hgb urine dipstick: NEGATIVE
Ketones, ur: NEGATIVE mg/dL
Leukocytes,Ua: NEGATIVE
Nitrite: NEGATIVE
Protein, ur: NEGATIVE mg/dL
Specific Gravity, Urine: 1.017 (ref 1.005–1.030)
pH: 6 (ref 5.0–8.0)

## 2023-07-16 MED ORDER — SODIUM CHLORIDE 0.9% FLUSH
10.0000 mL | Freq: Once | INTRAVENOUS | Status: AC
  Administered 2023-07-16: 10 mL

## 2023-07-16 MED ORDER — HEPARIN SOD (PORK) LOCK FLUSH 100 UNIT/ML IV SOLN
250.0000 [IU] | Freq: Once | INTRAVENOUS | Status: AC
  Administered 2023-07-16: 250 [IU]

## 2023-07-16 MED ORDER — SODIUM CHLORIDE 0.9 % IV SOLN: Freq: Once | INTRAVENOUS | Status: AC

## 2023-07-16 NOTE — ED Notes (Signed)
 Heparin lock used and port de accessed. Pt wheeled outside and assisted into his car.

## 2023-07-16 NOTE — Progress Notes (Addendum)
 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 He is planning to receive concurrent chemoradiation (first chemo 3/21). First RT planned 3/24. Patient is under the care of Dr. Basilio Cairo and Dr. Arlana Pouch.   S/p Gtube 2/28  Met with Isaac Carpenter and wife in infusion. Treatment held today. He is receiving IVF. Isaac Carpenter tearful at visit, states he just feels awful. He is tolerating bites of soft textures. Isaac Carpenter drinking a few sips of water as this is painful to swallow. Wife reports he does a little better with milk. Isaac Carpenter declined to start bolus feedings this week. He is agreeable to initiate at this time. "Anything to make me feel better." Isaac Carpenter reports increased urinary output the last few days. He denies pain, however reports mild burning sensation. Urine sample provided and sent to lab.    Medications: reviewed   Labs: 3/27 - Na 133, glucose 119  Anthropometrics: Wt 273 lb 1.6 oz 3/27 decreased 3% (9 lbs) in one week - this is severe for time frame  3/20 - 282 lb 3/17 - 280 lb  2/26 - 280 lb 12 oz    Estimated Energy Needs  Kcals: 2800-3180 Protein: 140-153 Fluid: >/=2.8 L  NUTRITION DIAGNOSIS: Unintended wt loss continues    MALNUTRITION DIAGNOSIS: Patient is at risk for malnutrition given wt loss and poor po - Isaac Carpenter will start TF   INTERVENTION:  Start with one carton Osmolite 1.5 QID and increase by one carton/day as tolerated to goal noted below Encourage po of soft moist textures as tolerated  Supportive listening and encouragement   Goal: Anticipate long term enteral needs 2 cartons Nutren 1.5 (500 ml) QID. Flush with 60 ml water before and after bolus. Give 250 ml free water QID to meet hydration needs. Provides 2000 ml/day, 3000 kcal, 136 g protein, 3008 ml total water. Meets 100% DRI   MONITORING, EVALUATION, GOAL: wt trends, intake   NEXT VISIT: Friday April 4 during infusion

## 2023-07-16 NOTE — Assessment & Plan Note (Signed)
 He is experiencing significant fatigue and weakness, worsening since Monday, with an inability to sit up, eat, or drink due to lack of energy. This level of fatigue is atypical for his current treatment stage, usually occurring in the sixth or seventh week of radiation therapy. Continuing chemotherapy could exacerbate his condition, so it is paused to prevent further deterioration. Radiation therapy is essential and will continue as scheduled to avoid jeopardizing treatment progress.  We will skip chemotherapy this week. - Administer IV fluids to improve hydration and energy levels - Monitor electrolytes and adjust fluids as necessary - Continue radiation therapy as scheduled

## 2023-07-16 NOTE — Patient Instructions (Signed)

## 2023-07-19 ENCOUNTER — Other Ambulatory Visit: Payer: Self-pay

## 2023-07-19 ENCOUNTER — Ambulatory Visit

## 2023-07-19 ENCOUNTER — Ambulatory Visit
Admission: RE | Admit: 2023-07-19 | Discharge: 2023-07-19 | Disposition: A | Discharge status: Home / Self Care | Source: Ambulatory Visit | Attending: Radiation Oncology | Admitting: Radiation Oncology

## 2023-07-19 ENCOUNTER — Ambulatory Visit
Admission: RE | Admit: 2023-07-19 | Discharge: 2023-07-19 | Disposition: A | Discharge status: Home / Self Care | Source: Ambulatory Visit | Attending: Radiation Oncology

## 2023-07-19 ENCOUNTER — Encounter: Payer: Self-pay | Admitting: Radiation Oncology

## 2023-07-19 DIAGNOSIS — Z931 Gastrostomy status: Secondary | ICD-10-CM | POA: Diagnosis not present

## 2023-07-19 DIAGNOSIS — R1310 Dysphagia, unspecified: Secondary | ICD-10-CM | POA: Diagnosis not present

## 2023-07-19 DIAGNOSIS — Z79899 Other long term (current) drug therapy: Secondary | ICD-10-CM | POA: Diagnosis not present

## 2023-07-19 DIAGNOSIS — M546 Pain in thoracic spine: Secondary | ICD-10-CM | POA: Diagnosis not present

## 2023-07-19 DIAGNOSIS — C090 Malignant neoplasm of tonsillar fossa: Secondary | ICD-10-CM

## 2023-07-19 DIAGNOSIS — Z7982 Long term (current) use of aspirin: Secondary | ICD-10-CM | POA: Diagnosis not present

## 2023-07-19 DIAGNOSIS — Z87891 Personal history of nicotine dependence: Secondary | ICD-10-CM | POA: Diagnosis not present

## 2023-07-19 DIAGNOSIS — Z923 Personal history of irradiation: Secondary | ICD-10-CM | POA: Diagnosis not present

## 2023-07-19 DIAGNOSIS — G893 Neoplasm related pain (acute) (chronic): Secondary | ICD-10-CM | POA: Diagnosis not present

## 2023-07-19 DIAGNOSIS — Z510 Encounter for antineoplastic radiation therapy: Secondary | ICD-10-CM | POA: Diagnosis not present

## 2023-07-19 DIAGNOSIS — Z5111 Encounter for antineoplastic chemotherapy: Secondary | ICD-10-CM | POA: Diagnosis not present

## 2023-07-19 DIAGNOSIS — Z95828 Presence of other vascular implants and grafts: Secondary | ICD-10-CM | POA: Diagnosis not present

## 2023-07-19 DIAGNOSIS — Z79891 Long term (current) use of opiate analgesic: Secondary | ICD-10-CM | POA: Diagnosis not present

## 2023-07-19 LAB — RAD ONC ARIA SESSION SUMMARY
Course Elapsed Days: 7
Plan Fractions Treated to Date: 6
Plan Prescribed Dose Per Fraction: 2 Gy
Plan Total Fractions Prescribed: 35
Plan Total Prescribed Dose: 70 Gy
Reference Point Dosage Given to Date: 12 Gy
Reference Point Session Dosage Given: 2 Gy
Session Number: 6

## 2023-07-19 LAB — BASIC METABOLIC PANEL - CANCER CENTER ONLY
Anion gap: 6 (ref 5–15)
BUN: 24 mg/dL — ABNORMAL HIGH (ref 8–23)
CO2: 33 mmol/L — ABNORMAL HIGH (ref 22–32)
Calcium: 8.6 mg/dL — ABNORMAL LOW (ref 8.9–10.3)
Chloride: 94 mmol/L — ABNORMAL LOW (ref 98–111)
Creatinine: 0.75 mg/dL (ref 0.61–1.24)
GFR, Estimated: >60 mL/min (ref >60)
Glucose, Bld: 147 mg/dL — ABNORMAL HIGH (ref 70–99)
Potassium: 3.7 mmol/L (ref 3.5–5.1)
Sodium: 133 mmol/L — ABNORMAL LOW (ref 135–145)

## 2023-07-19 MED ORDER — HEPARIN SOD (PORK) LOCK FLUSH 100 UNIT/ML IV SOLN
500.0000 [IU] | Freq: Once | INTRAVENOUS | Status: DC
Start: 2023-07-19 — End: 2023-07-20

## 2023-07-19 MED ORDER — SODIUM CHLORIDE 0.9% FLUSH: 10.0000 mL | INTRAVENOUS | Status: DC | PRN

## 2023-07-19 NOTE — Progress Notes (Signed)
 Nutrition Follow-up:  Received phone call from wife that she is out of osmolite 1.5 and needs more today.    Met patient and wife in radiation exam room and provided 8 more cartons of osmolite 1.5 for today.  Spoke with Elita Quick, representative for [employer] and planning to ship out supplies later today.  Informed wife.   Wife reports that patient received 5-6 cartons of osmolite 1.5 on Saturday and 7 yesterday (Sunday) via feeding tube.  Patient reports bowel movement this am.  Patient reports that he is feeling dizzy.     Labs: no new labs today at time of visit  Anthropometrics:   Weight 274 lb today per [employer] (decreased weight due to poor po intake and delay in starting tube feeding until this weekend.  273 lb 1.6 oz on 3/27  3/20 282 lb  3/17- 280 lb  2/26- 280 lb 12 oz   Estimated Energy Needs  Kcals: 2800-3180 Protein: 140-153 g Fluid: > 2.8 L  NUTRITION DIAGNOSIS: Unintentional weight loss continues   INTERVENTION:  Continue osmolite 1.5, goal rate 2 cartons QID. Flush with 60ml of water before and after each feeding.  Provide additional QID  to meet hydration needs.   Patient to pay attention to color of urine and if not light yellow will provide more hydration via tube.   Patient to be seen in [facility] today per Dr Basilio Cairo.   Called [employer] and will ship out supplies today. Wife provided with 8 cartons of osmolite 1.5 today.      MONITORING, EVALUATION, GOAL: weight trends, intake, tube feeding    NEXT VISIT: Friday, April 4 during infusion  Anisia Leija B. Elease Hashimoto, CSO, LDN Registered Dietitian [phone]

## 2023-07-19 NOTE — Progress Notes (Signed)
 I met with Mr. Isaac Carpenter and his wife after the results from his BMP came back.  He is mildly dehydrated.  He had been feeling dizzy earlier this morning but no longer feels dizzy.  Heart is regular in rate and rhythm and his chest is clear to auscultation.  He denies chest pressure or chest pain or difficulty breathing.  No headache.  We talked about the option for IV fluids and the most cautionary option of sending him to the emergency room (as symptom management clinic in the cancer center is currently full).  We also talked about the option of going home and pushing nutrition and oral fluids and going to the emergency room if the dizziness returns.  He would like to go home and push fluids, nutrition.  His wife is in full support.  We will see him back oncology tomorrow for treatment. -----------------------------------  Lonie Peak, MD

## 2023-07-20 ENCOUNTER — Ambulatory Visit
Admission: RE | Admit: 2023-07-20 | Discharge: 2023-07-20 | Disposition: A | Discharge status: Home / Self Care | Source: Ambulatory Visit | Attending: Radiation Oncology | Admitting: Radiation Oncology

## 2023-07-20 ENCOUNTER — Other Ambulatory Visit: Payer: Self-pay

## 2023-07-20 ENCOUNTER — Ambulatory Visit: Payer: Medicare Other | Admitting: Family Medicine

## 2023-07-20 DIAGNOSIS — Z7982 Long term (current) use of aspirin: Secondary | ICD-10-CM | POA: Diagnosis not present

## 2023-07-20 DIAGNOSIS — M546 Pain in thoracic spine: Secondary | ICD-10-CM | POA: Insufficient documentation

## 2023-07-20 DIAGNOSIS — Z95828 Presence of other vascular implants and grafts: Secondary | ICD-10-CM | POA: Diagnosis not present

## 2023-07-20 DIAGNOSIS — Z5111 Encounter for antineoplastic chemotherapy: Secondary | ICD-10-CM | POA: Diagnosis not present

## 2023-07-20 DIAGNOSIS — F419 Anxiety disorder, unspecified: Secondary | ICD-10-CM | POA: Diagnosis not present

## 2023-07-20 DIAGNOSIS — Z510 Encounter for antineoplastic radiation therapy: Secondary | ICD-10-CM | POA: Insufficient documentation

## 2023-07-20 DIAGNOSIS — Z87891 Personal history of nicotine dependence: Secondary | ICD-10-CM | POA: Insufficient documentation

## 2023-07-20 DIAGNOSIS — Z8546 Personal history of malignant neoplasm of prostate: Secondary | ICD-10-CM | POA: Insufficient documentation

## 2023-07-20 DIAGNOSIS — Z79899 Other long term (current) drug therapy: Secondary | ICD-10-CM | POA: Diagnosis not present

## 2023-07-20 DIAGNOSIS — Z923 Personal history of irradiation: Secondary | ICD-10-CM | POA: Insufficient documentation

## 2023-07-20 DIAGNOSIS — R1310 Dysphagia, unspecified: Secondary | ICD-10-CM | POA: Diagnosis not present

## 2023-07-20 DIAGNOSIS — C61 Malignant neoplasm of prostate: Secondary | ICD-10-CM | POA: Insufficient documentation

## 2023-07-20 DIAGNOSIS — Z79891 Long term (current) use of opiate analgesic: Secondary | ICD-10-CM | POA: Insufficient documentation

## 2023-07-20 DIAGNOSIS — C090 Malignant neoplasm of tonsillar fossa: Secondary | ICD-10-CM | POA: Insufficient documentation

## 2023-07-20 DIAGNOSIS — Z931 Gastrostomy status: Secondary | ICD-10-CM | POA: Insufficient documentation

## 2023-07-20 DIAGNOSIS — G893 Neoplasm related pain (acute) (chronic): Secondary | ICD-10-CM | POA: Insufficient documentation

## 2023-07-20 LAB — RAD ONC ARIA SESSION SUMMARY
Course Elapsed Days: 8
Plan Fractions Treated to Date: 7
Plan Prescribed Dose Per Fraction: 2 Gy
Plan Total Fractions Prescribed: 35
Plan Total Prescribed Dose: 70 Gy
Reference Point Dosage Given to Date: 14 Gy
Reference Point Session Dosage Given: 2 Gy
Session Number: 7

## 2023-07-20 NOTE — Addendum Note (Signed)
 Encounter addended by: Edward Qualia on: 07/20/2023 1:33 PM  Actions taken: Imaging Exam ended

## 2023-07-21 ENCOUNTER — Ambulatory Visit
Admission: RE | Admit: 2023-07-21 | Discharge: 2023-07-21 | Disposition: A | Discharge status: Home / Self Care | Source: Ambulatory Visit | Attending: Radiation Oncology

## 2023-07-21 ENCOUNTER — Other Ambulatory Visit: Payer: Self-pay

## 2023-07-21 DIAGNOSIS — Z95828 Presence of other vascular implants and grafts: Secondary | ICD-10-CM | POA: Diagnosis not present

## 2023-07-21 DIAGNOSIS — Z7982 Long term (current) use of aspirin: Secondary | ICD-10-CM | POA: Diagnosis not present

## 2023-07-21 DIAGNOSIS — C090 Malignant neoplasm of tonsillar fossa: Secondary | ICD-10-CM | POA: Diagnosis not present

## 2023-07-21 DIAGNOSIS — Z510 Encounter for antineoplastic radiation therapy: Secondary | ICD-10-CM | POA: Diagnosis not present

## 2023-07-21 DIAGNOSIS — Z79891 Long term (current) use of opiate analgesic: Secondary | ICD-10-CM | POA: Diagnosis not present

## 2023-07-21 DIAGNOSIS — Z923 Personal history of irradiation: Secondary | ICD-10-CM | POA: Diagnosis not present

## 2023-07-21 DIAGNOSIS — G893 Neoplasm related pain (acute) (chronic): Secondary | ICD-10-CM | POA: Diagnosis not present

## 2023-07-21 DIAGNOSIS — Z79899 Other long term (current) drug therapy: Secondary | ICD-10-CM | POA: Diagnosis not present

## 2023-07-21 DIAGNOSIS — R1310 Dysphagia, unspecified: Secondary | ICD-10-CM | POA: Diagnosis not present

## 2023-07-21 DIAGNOSIS — Z931 Gastrostomy status: Secondary | ICD-10-CM | POA: Diagnosis not present

## 2023-07-21 DIAGNOSIS — M546 Pain in thoracic spine: Secondary | ICD-10-CM | POA: Diagnosis not present

## 2023-07-21 DIAGNOSIS — Z87891 Personal history of nicotine dependence: Secondary | ICD-10-CM | POA: Diagnosis not present

## 2023-07-21 DIAGNOSIS — Z5111 Encounter for antineoplastic chemotherapy: Secondary | ICD-10-CM | POA: Diagnosis not present

## 2023-07-21 LAB — RAD ONC ARIA SESSION SUMMARY
Course Elapsed Days: 9
Plan Fractions Treated to Date: 8
Plan Prescribed Dose Per Fraction: 2 Gy
Plan Total Fractions Prescribed: 35
Plan Total Prescribed Dose: 70 Gy
Reference Point Dosage Given to Date: 16 Gy
Reference Point Session Dosage Given: 2 Gy
Session Number: 8

## 2023-07-22 ENCOUNTER — Other Ambulatory Visit: Payer: Self-pay

## 2023-07-22 ENCOUNTER — Encounter: Payer: Self-pay | Admitting: Oncology

## 2023-07-22 ENCOUNTER — Ambulatory Visit: Attending: Radiation Oncology

## 2023-07-22 ENCOUNTER — Inpatient Hospital Stay

## 2023-07-22 ENCOUNTER — Ambulatory Visit: Attending: Radiation Oncology | Admitting: Physical Therapy

## 2023-07-22 ENCOUNTER — Ambulatory Visit
Admission: RE | Admit: 2023-07-22 | Discharge: 2023-07-22 | Disposition: A | Discharge status: Home / Self Care | Source: Ambulatory Visit | Attending: Radiation Oncology

## 2023-07-22 ENCOUNTER — Inpatient Hospital Stay: Attending: Nurse Practitioner | Admitting: Oncology

## 2023-07-22 ENCOUNTER — Encounter: Payer: Self-pay | Admitting: Physical Therapy

## 2023-07-22 VITALS — BP 99/59 | HR 87 | Temp 98.4°F | Resp 17 | Wt 275.2 lb

## 2023-07-22 DIAGNOSIS — C61 Malignant neoplasm of prostate: Secondary | ICD-10-CM | POA: Insufficient documentation

## 2023-07-22 DIAGNOSIS — Z5111 Encounter for antineoplastic chemotherapy: Secondary | ICD-10-CM | POA: Insufficient documentation

## 2023-07-22 DIAGNOSIS — Z7982 Long term (current) use of aspirin: Secondary | ICD-10-CM | POA: Diagnosis not present

## 2023-07-22 DIAGNOSIS — K5900 Constipation, unspecified: Secondary | ICD-10-CM | POA: Insufficient documentation

## 2023-07-22 DIAGNOSIS — R5383 Other fatigue: Secondary | ICD-10-CM | POA: Insufficient documentation

## 2023-07-22 DIAGNOSIS — D701 Agranulocytosis secondary to cancer chemotherapy: Secondary | ICD-10-CM | POA: Insufficient documentation

## 2023-07-22 DIAGNOSIS — R1310 Dysphagia, unspecified: Secondary | ICD-10-CM | POA: Insufficient documentation

## 2023-07-22 DIAGNOSIS — Z8546 Personal history of malignant neoplasm of prostate: Secondary | ICD-10-CM | POA: Insufficient documentation

## 2023-07-22 DIAGNOSIS — E860 Dehydration: Secondary | ICD-10-CM | POA: Insufficient documentation

## 2023-07-22 DIAGNOSIS — Z95828 Presence of other vascular implants and grafts: Secondary | ICD-10-CM | POA: Diagnosis not present

## 2023-07-22 DIAGNOSIS — Z79891 Long term (current) use of opiate analgesic: Secondary | ICD-10-CM | POA: Insufficient documentation

## 2023-07-22 DIAGNOSIS — F419 Anxiety disorder, unspecified: Secondary | ICD-10-CM | POA: Insufficient documentation

## 2023-07-22 DIAGNOSIS — Z79899 Other long term (current) drug therapy: Secondary | ICD-10-CM | POA: Diagnosis not present

## 2023-07-22 DIAGNOSIS — C090 Malignant neoplasm of tonsillar fossa: Secondary | ICD-10-CM | POA: Insufficient documentation

## 2023-07-22 DIAGNOSIS — Z931 Gastrostomy status: Secondary | ICD-10-CM | POA: Diagnosis not present

## 2023-07-22 DIAGNOSIS — I959 Hypotension, unspecified: Secondary | ICD-10-CM | POA: Insufficient documentation

## 2023-07-22 DIAGNOSIS — T451X5A Adverse effect of antineoplastic and immunosuppressive drugs, initial encounter: Secondary | ICD-10-CM | POA: Insufficient documentation

## 2023-07-22 DIAGNOSIS — R110 Nausea: Secondary | ICD-10-CM | POA: Insufficient documentation

## 2023-07-22 DIAGNOSIS — R42 Dizziness and giddiness: Secondary | ICD-10-CM | POA: Insufficient documentation

## 2023-07-22 DIAGNOSIS — Z87891 Personal history of nicotine dependence: Secondary | ICD-10-CM | POA: Insufficient documentation

## 2023-07-22 DIAGNOSIS — G893 Neoplasm related pain (acute) (chronic): Secondary | ICD-10-CM | POA: Insufficient documentation

## 2023-07-22 DIAGNOSIS — Z510 Encounter for antineoplastic radiation therapy: Secondary | ICD-10-CM | POA: Diagnosis not present

## 2023-07-22 DIAGNOSIS — Z923 Personal history of irradiation: Secondary | ICD-10-CM | POA: Diagnosis not present

## 2023-07-22 DIAGNOSIS — K1231 Oral mucositis (ulcerative) due to antineoplastic therapy: Secondary | ICD-10-CM | POA: Insufficient documentation

## 2023-07-22 DIAGNOSIS — R634 Abnormal weight loss: Secondary | ICD-10-CM | POA: Insufficient documentation

## 2023-07-22 DIAGNOSIS — R293 Abnormal posture: Secondary | ICD-10-CM | POA: Diagnosis not present

## 2023-07-22 DIAGNOSIS — F418 Other specified anxiety disorders: Secondary | ICD-10-CM

## 2023-07-22 DIAGNOSIS — R300 Dysuria: Secondary | ICD-10-CM | POA: Insufficient documentation

## 2023-07-22 DIAGNOSIS — R140 Abdominal distension (gaseous): Secondary | ICD-10-CM | POA: Insufficient documentation

## 2023-07-22 DIAGNOSIS — M546 Pain in thoracic spine: Secondary | ICD-10-CM | POA: Diagnosis not present

## 2023-07-22 LAB — CBC WITH DIFFERENTIAL (CANCER CENTER ONLY)
Abs Immature Granulocytes: 0.03 10*3/uL (ref 0.00–0.07)
Basophils Absolute: 0 10*3/uL (ref 0.0–0.1)
Basophils Relative: 0 %
Eosinophils Absolute: 0.1 10*3/uL (ref 0.0–0.5)
Eosinophils Relative: 2 %
HCT: 38.4 % — ABNORMAL LOW (ref 39.0–52.0)
Hemoglobin: 13.1 g/dL (ref 13.0–17.0)
Immature Granulocytes: 1 %
Lymphocytes Relative: 15 %
Lymphs Abs: 0.9 10*3/uL (ref 0.7–4.0)
MCH: 31.2 pg (ref 26.0–34.0)
MCHC: 34.1 g/dL (ref 30.0–36.0)
MCV: 91.4 fL (ref 80.0–100.0)
Monocytes Absolute: 0.7 10*3/uL (ref 0.1–1.0)
Monocytes Relative: 12 %
Neutro Abs: 4.1 10*3/uL (ref 1.7–7.7)
Neutrophils Relative %: 70 %
Platelet Count: 196 10*3/uL (ref 150–400)
RBC: 4.2 MIL/uL — ABNORMAL LOW (ref 4.22–5.81)
RDW: 13.5 % (ref 11.5–15.5)
WBC Count: 5.8 10*3/uL (ref 4.0–10.5)
nRBC: 0 % (ref 0.0–0.2)

## 2023-07-22 LAB — RAD ONC ARIA SESSION SUMMARY
Course Elapsed Days: 10
Plan Fractions Treated to Date: 9
Plan Prescribed Dose Per Fraction: 2 Gy
Plan Total Fractions Prescribed: 35
Plan Total Prescribed Dose: 70 Gy
Reference Point Dosage Given to Date: 18 Gy
Reference Point Session Dosage Given: 2 Gy
Session Number: 9

## 2023-07-22 LAB — BASIC METABOLIC PANEL - CANCER CENTER ONLY
Anion gap: 6 (ref 5–15)
BUN: 24 mg/dL — ABNORMAL HIGH (ref 8–23)
CO2: 34 mmol/L — ABNORMAL HIGH (ref 22–32)
Calcium: 9.3 mg/dL (ref 8.9–10.3)
Chloride: 94 mmol/L — ABNORMAL LOW (ref 98–111)
Creatinine: 0.75 mg/dL (ref 0.61–1.24)
GFR, Estimated: >60 mL/min (ref >60)
Glucose, Bld: 132 mg/dL — ABNORMAL HIGH (ref 70–99)
Potassium: 4.2 mmol/L (ref 3.5–5.1)
Sodium: 134 mmol/L — ABNORMAL LOW (ref 135–145)

## 2023-07-22 LAB — MAGNESIUM: Magnesium: 1.9 mg/dL (ref 1.7–2.4)

## 2023-07-22 MED ORDER — HEPARIN SOD (PORK) LOCK FLUSH 100 UNIT/ML IV SOLN
500.0000 [IU] | Freq: Once | INTRAVENOUS | Status: AC
  Administered 2023-07-22: 500 [IU]

## 2023-07-22 MED ORDER — SODIUM CHLORIDE 0.9% FLUSH
10.0000 mL | Freq: Once | INTRAVENOUS | Status: AC
  Administered 2023-07-22: 10 mL

## 2023-07-22 MED FILL — Fosaprepitant Dimeglumine For IV Infusion 150 MG (Base Eq): INTRAVENOUS | Qty: 5 | Status: AC

## 2023-07-22 NOTE — Progress Notes (Unsigned)
 Patient seen by Dr. Archie Patten Pasam today  Vitals are within treatment parameters:Yes   Labs are within treatment parameters: Yes   Treatment plan has been signed: Yes   Per physician team, Patient is ready for treatment and there are NO modifications to the treatment plan.

## 2023-07-22 NOTE — Progress Notes (Signed)
 [facility]  ONCOLOGY CLINIC PROGRESS NOTE   Patient Care Team: [name], [name], MD as PCP - General (Family Medicine) Cherlyn Cushing, RN as Oncology Nurse Navigator Ernesto Rutherford, MD as Referring Physician (Ophthalmology) Marcine Matar, MD as Consulting Physician (Urology) Margaretmary Dys, MD as Consulting Physician (Radiation Oncology) Maryclare Labrador, RN as Registered Nurse Axel Filler, Larna Daughters, NP as Nurse Practitioner (Hematology and Oncology) Lonie Peak, MD as Attending Physician (Radiation Oncology) Ashok Croon, MD as Consulting Physician (Otolaryngology) Malmfelt, Lise Auer, RN as Oncology Nurse Navigator Meryl Crutch, MD as Consulting Physician (Oncology)  PATIENT NAME: Isaac Carpenter   MR#: [id number] DOB: 07-Oct-1950  Date of visit: 07/22/2023   ASSESSMENT & PLAN:   Isaac Carpenter is a 73 y.o. gentleman with a past medical history of prostate cancer diagnosed in March 2024, S/P radioactive seed implant/brachytherapy, hypertension, dyslipidemia, hypothyroidism, obstructive sleep apnea. He presented for follow up of recently diagnosed squamous cell carcinoma of the right tonsil, clinical stage II disease (cT3,cN2,cM0,p16+).   Malignant neoplasm of tonsillar fossa (HCC) Please review oncology history for additional details and timeline of events.  Symptoms include severe pain and dysphagia since mid-November, progressively worsening.   cT3,cN2,cM0,p16+ tumor, Stage II disease.   His case was discussed in tumor conference on 06/09/2023.  Given the extent of disease, consensus opinion is to proceed with concurrent chemoradiation. We have discussed about role of cisplatin being a radiosensitizer in the treatment of head and neck cancer.  We have discussed about the curative intent of chemoradiation for this patient.  Patient was willing to proceed with weekly cisplatin after discussing risk versus benefits and side effect profile.  He started cycle  1 of cisplatin at a dose of 40 mg/m on 07/09/2023.  Plan was to continue cisplatin weekly during the course of radiation.  He began radiation treatments from 07/12/2023.  Last week chemotherapy had to be held because of severe fatigue with declined performance status of 3.  This week he is doing much better and he is in good spirits.  CBCD is entirely unremarkable today.  Creatinine stable at 0.75.  Will resume chemotherapy and proceed with cycle 3 of cisplatin today at 40 mg/m.  - Ensure home hydration (80-90 oz non-caffeinated fluids daily).  Patient's wife was encouraged to start using feeding tube as suggested by the nutritionist and also to use feeding tube for hydration.  - Monitor and replace electrolytes as needed  RTC in 1 week for labs, follow-up and continuation of chemotherapy.  Dysphagia He has been having dysphagia.  Encouraged him to use feeding tube for adequate hydration and nutrition.  Continue to eat and drink as much as he can through the mouth to preserve muscle function.  We advised him to crush or dissolve tablets and take them via feeding tube, where needed.  Situational anxiety Experiencing significant anxiety with symptoms of racing thoughts. No prior history of anxiolytic use. Alprazolam prescribed on 07/15/2023 for short-term relief, with evaluation for long-acting medication if symptoms persist.  - Evaluate anxiety levels next week to consider long-acting anxiolytic if needed     I reviewed lab results and outside records for this visit and discussed relevant results with the patient. Diagnosis, plan of care and treatment options were also discussed in detail with the patient. Opportunity provided to ask questions and answers provided to his apparent satisfaction. Provided instructions to call our clinic with any problems, questions or concerns prior to return visit. I recommended to continue follow-up  with PCP and sub-specialists. He verbalized understanding  and agreed with the plan.   NCCN guidelines have been consulted in the planning of this patient's care.  I spent a total of 30 minutes during this encounter with the patient including review of chart and various tests results, discussions about plan of care and coordination of care plan.   Meryl Crutch, MD   [facility] - A DEPT OF [facility] [address] [place] [address] Dept: [phone] Dept Fax: [phone]    CHIEF COMPLAINT/ REASON FOR VISIT:   Follow-up for squamous cell carcinoma of the right tonsil, stage II disease (cT3,cN2,cM0,p16+)  Current Treatment: Concurrent chemoradiation with weekly cisplatin started from 07/09/2023.  INTERVAL HISTORY:    Discussed the use of AI scribe software for clinical note transcription with the patient, who gave verbal consent to proceed.   Isaac Carpenter is here today for repeat clinical assessment.   He started concurrent chemoradiation with weekly cisplatin on 07/09/2023.  Last week, he missed a chemotherapy session due to severe fatigue.  He continued to receive radiation treatments daily.  He experiences nausea and bloating, likely related to tube feeds. He feels bloated and nauseous, and Zofran was administered once for relief. Despite these issues, he has gained a couple of pounds over the last week. No vomiting, except for one episode of nausea related to feeling bloated.  He describes persistent soreness and a dry mouth with thick saliva, which may be related to mucositis from cancer treatment. He has been using mouth rinses with baking soda and salt, but finds them ineffective. He has also been prescribed viscous lidocaine rinse, which he finds unpleasant due to its taste and the numb sensation it causes.  His blood pressure has been low, with the systolic level below 100. He has not been taking his blood pressure medication, lisinopril and  hydrochlorothiazide, as advised when his blood pressure is low. No dizziness or lightheadedness, except occasionally when standing for long periods.  He has not been using baclofen for a long time, which he takes as needed, typically at bedtime.  I have reviewed the past medical history, past surgical history, social history and family history with the patient and they are unchanged from previous note.  HISTORY OF PRESENT ILLNESS:   ONCOLOGY HISTORY:   He presented to his PCP Dr Betty [name] on 05-17-23 with complains of right-sided jaw/maxillary pain that tends to occur when coughing. Pain had persisted for several months at that time following an upper respiratory infection.    Subsequently, he underwent a CT soft tissue neck on 05-17-23 which revealed an ulcerated enhancing mass involving the superior oropharynx and soft palate/uvula measuring 3.4 x 3.0 x 3.6 cm in the greatest extent that is concerning for malignancy. No evidence of cervical lymphadenopathy was indicated on scan    In light of findings, the patient saw Dr. Ashok Croon on 05-27-23 with worsening symptoms. During his visit, he underwent a flexible fiberoptic laryngoscopy with several biopsies of the friable tissue from the right oropharyngeal mass.     Biopsy of right oropharynx mass on 05-27-23 revealed: Invasive moderately differentiated squamous cell carcinoma. Immunohistochemistry for p16 shows diffuse strong positivity.    During most recent visit with Dr. Irene Pap on 06-04-23, patient reported constant worsening symptoms of odynophagia and dysphagia. Stating that he's unable to maintain adequate nutrition as food typically consists of few bites of soft food and liquids.  Pain does not seem to be managed by pain relievers including oxycodone. To further evaluate the extent of the disease, he underwent a PET scan performed on 06-07-23 revealing a right  oropharyngeal tumor >4cm, likely b/l level II adenopathy, no distant  metastases.    cT3,cN2,cM0,p16+ tumor, Stage II disease.    Plan made for concurrent chemoradiation with weekly cisplatin. Started Cisplatin from 07/09/2023 and radiation from 07/12/23.   Oncology History  Malignant neoplasm of prostate (HCC)  05/13/2022 Cancer Staging   Staging form: Prostate, AJCC 8th Edition - Clinical stage from 05/13/2022: Stage IIC (cT1c, cN0, cM0, PSA: 13.2, Grade Group: 3) - Signed by Marcello Fennel, PA-C on 07/17/2022 Histopathologic type: Adenocarcinoma, NOS Stage prefix: Initial diagnosis Prostate specific antigen (PSA) range: 10 to 19 Gleason primary pattern: 4 Gleason secondary pattern: 3 Gleason score: 7 Histologic grading system: 5 grade system Number of biopsy cores examined: 12 Number of biopsy cores positive: 4 Location of positive needle core biopsies: Both sides   07/17/2022 Initial Diagnosis   Malignant neoplasm of prostate (HCC)   Malignant neoplasm of tonsillar fossa (HCC)  06/08/2023 Initial Diagnosis   Malignant neoplasm of tonsillar fossa (HCC)   06/08/2023 Cancer Staging   Staging form: Pharynx - HPV-Mediated Oropharynx, AJCC 8th Edition - Clinical stage from 06/08/2023: Stage II (cT3, cN2, cM0, p16+) - Signed by Lonie Peak, MD on 06/08/2023 Stage prefix: Initial diagnosis   07/09/2023 -  Chemotherapy   Patient is on Treatment Plan : HEAD/NECK Cisplatin (40) q7d         REVIEW OF SYSTEMS:   Review of Systems - Oncology  All other pertinent systems were reviewed with the patient and are negative.  ALLERGIES: He is allergic to testosterone.  MEDICATIONS:  Current Outpatient Medications  Medication Sig Dispense Refill   aspirin EC 81 MG tablet Take 81 mg by mouth daily. Swallow whole.     atorvastatin (LIPITOR) 20 MG tablet Take 1 tablet (20 mg total) by mouth daily. 90 tablet 3   dexamethasone (DECADRON) 4 MG tablet Take 2 tablets (8 mg) by mouth daily x 3 days starting the day after cisplatin chemotherapy. Take with food. 30  tablet 1   levothyroxine (SYNTHROID) 88 MCG tablet TAKE ONE TABLET BY MOUTH DAILY. 90 tablet 2   lidocaine-prilocaine (EMLA) cream Apply to affected area once 30 g 3   lisinopril-hydrochlorothiazide (ZESTORETIC) 20-25 MG tablet Take 1 tablet by mouth once daily. 90 tablet 2   Nutritional Supplements (NUTREN 1.5) LIQD 2 cartons Nutren 1.5 (500 ml) QID via tube. Flush with 60 ml water before/after each bolus. Provide additional 250 ml water flush 4x/day in between feedings to meet hydration needs. Provides 3000 kcal, 136 g protein, 1528 ml free water (3008 ml total water) 2000 ml/day meets 100% DRI     ondansetron (ZOFRAN) 8 MG tablet Take 1 tablet (8 mg total) by mouth every 8 (eight) hours as needed for nausea or vomiting. Start on the third day after cisplatin. 30 tablet 1   solifenacin (VESICARE) 5 MG tablet Take 5 mg by mouth daily.     acetaminophen (TYLENOL) 500 MG tablet Take 1 tablet (500 mg total) by mouth every 6 (six) hours. Please take every 6 hrs and stagger with Motrin 3 hrs apart from each other. Please do not exceed maximum daily dose to avoid liver damage (Patient not taking: Reported on 07/22/2023) 30 tablet 1   ALPRAZolam (XANAX) 1 MG tablet Take 0.5 tablets (0.5 mg total) by mouth 2 (two)  times daily as needed for anxiety. (Patient not taking: Reported on 07/22/2023) 60 tablet 0   baclofen (LIORESAL) 10 MG tablet Take 0.5-1 tablets (5-10 mg total) by mouth at bedtime as needed for muscle spasms. (Patient not taking: Reported on 07/22/2023) 10 each 0   doxazosin (CARDURA) 1 MG tablet Take 1 tablet (1 mg total) by mouth at bedtime. (Patient not taking: Reported on 07/22/2023) 30 tablet 1   ibuprofen (ADVIL) 600 MG tablet Take 1 tablet (600 mg total) by mouth every 6 (six) hours. Please take every 6 hrs, and stagger this medication 3 hrs apart from Tylenol (Patient not taking: Reported on 07/22/2023) 30 tablet 0   lidocaine (XYLOCAINE) 2 % solution Use as directed 15 mLs in the mouth or throat  every 4 (four) hours as needed for mouth pain. (Patient not taking: Reported on 07/22/2023) 100 mL 1   magic mouthwash SOLN TAKE BY MOUTH THREE TIMES DAILY AS NEEDED FOR UP TO 10 DAYS FOR MOUTH PAIN (Patient not taking: Reported on 06/10/2023)     oxyCODONE 10 MG TABS Take 1 tablet (10 mg total) by mouth every 6 (six) hours as needed for severe pain (pain score 7-10). Use 1 tab Q4hrs for pain and if continue to have severe pain ok to take 2 tabs Q4hrs (Patient not taking: Reported on 07/22/2023) 90 tablet 0   prochlorperazine (COMPAZINE) 10 MG tablet Take 1 tablet (10 mg total) by mouth every 6 (six) hours as needed (Nausea or vomiting). (Patient not taking: Reported on 07/22/2023) 30 tablet 1   No current facility-administered medications for this visit.     VITALS:   Blood pressure (!) 99/59, pulse 87, temperature 98.4 F (36.9 C), temperature source Temporal, resp. rate 17, weight 275 lb 3.2 oz (124.8 kg), SpO2 97%.  Wt Readings from Last 3 Encounters:  07/22/23 275 lb 3.2 oz (124.8 kg)  07/15/23 273 lb 1.6 oz (123.9 kg)  07/15/23 273 lb 1.6 oz (123.9 kg)    Body mass index is 36.31 kg/m.    Onc Performance Status - 07/22/23 1120       ECOG Perf Status   ECOG Perf Status Ambulatory and capable of all selfcare but unable to carry out any work activities.  Up and about more than 50% of waking hours      KPS SCALE   KPS % SCORE Cares for self, unable to carry on normal activity or to do active work              PHYSICAL EXAM:   Physical Exam Constitutional:      General: He is not in acute distress.    Comments: Appears tired.  Presented to clinic in a wheelchair today  HENT:     Head: Normocephalic and atraumatic.     Mouth/Throat:     Comments: Right oropharynx tonsil tumor eroding through soft palate, tumor reaches uvula, uvula swollen Eyes:     General: No scleral icterus.    Conjunctiva/sclera: Conjunctivae normal.  Cardiovascular:     Rate and Rhythm: Normal  rate and regular rhythm.     Heart sounds: Normal heart sounds.  Pulmonary:     Effort: Pulmonary effort is normal.     Breath sounds: Normal breath sounds.  Chest:     Comments: Right sided Port-A-Cath on place without any signs of infection Abdominal:     General: There is no distension.     Comments: Feeding tube in place  Musculoskeletal:  Right lower leg: No edema.     Left lower leg: No edema.  Neurological:     General: No focal deficit present.     Mental Status: He is alert and oriented to person, place, and time.      LABORATORY DATA:   I have reviewed the data as listed.  Results for orders placed or performed in visit on 07/22/23  CBC with Differential (Cancer Center Only)  Result Value Ref Range   WBC Count 5.8 4.0 - 10.5 K/uL   RBC 4.20 (L) 4.22 - 5.81 MIL/uL   Hemoglobin 13.1 13.0 - 17.0 g/dL   HCT 16.1 (L) 09.6 - 04.5 %   MCV 91.4 80.0 - 100.0 fL   MCH 31.2 26.0 - 34.0 pg   MCHC 34.1 30.0 - 36.0 g/dL   RDW 40.9 81.1 - 91.4 %   Platelet Count 196 150 - 400 K/uL   nRBC 0.0 0.0 - 0.2 %   Neutrophils Relative % 70 %   Neutro Abs 4.1 1.7 - 7.7 K/uL   Lymphocytes Relative 15 %   Lymphs Abs 0.9 0.7 - 4.0 K/uL   Monocytes Relative 12 %   Monocytes Absolute 0.7 0.1 - 1.0 K/uL   Eosinophils Relative 2 %   Eosinophils Absolute 0.1 0.0 - 0.5 K/uL   Basophils Relative 0 %   Basophils Absolute 0.0 0.0 - 0.1 K/uL   Immature Granulocytes 1 %   Abs Immature Granulocytes 0.03 0.00 - 0.07 K/uL  Basic Metabolic Panel - Cancer Center Only  Result Value Ref Range   Sodium 134 (L) 135 - 145 mmol/L   Potassium 4.2 3.5 - 5.1 mmol/L   Chloride 94 (L) 98 - 111 mmol/L   CO2 34 (H) 22 - 32 mmol/L   Glucose, Bld 132 (H) 70 - 99 mg/dL   BUN 24 (H) 8 - 23 mg/dL   Creatinine 7.82 9.56 - 1.24 mg/dL   Calcium 9.3 8.9 - 21.3 mg/dL   GFR, Estimated >08 >65 mL/min   Anion gap 6 5 - 15  Magnesium  Result Value Ref Range   Magnesium 1.9 1.7 - 2.4 mg/dL  Results for orders  placed or performed in visit on 07/22/23  Rad Onc Aria Session Summary  Result Value Ref Range   Course ID [id number]    Course Start Date 07/05/2023    Session Number 9    Course First Treatment Date 07/12/2023  8:47 AM    Course Last Treatment Date 07/22/2023 10:04 AM    Course Elapsed Days 10    Reference Point ID [id number]    Reference Point Dosage Given to Date 18.00000009 Gy   Reference Point Session Dosage Given 2.00000001 Gy   Plan ID [email or web address]    Plan Fractions Treated to Date 9    Plan Total Fractions Prescribed 35    Plan Prescribed Dose Per Fraction 2 Gy   Plan Total Prescribed Dose 70.000000 Gy   Plan Primary Reference Point HN dp     RADIOGRAPHIC STUDIES:  I have personally reviewed the radiological images as listed and agree with the findings in the report.  DG SWALLOW FUNC OP MEDICARE SPEECH PATH Result Date: 06/28/2023 Table formatting from the original result was not included. Modified Barium Swallow Study Patient Details Name: BARNET BENAVIDES MRN: [id number] Date of Birth: 07/20/1950 Today's Date: 06/28/2023 HPI/PMH: HPI: Gurtej Noyola is a 73 y.o. male with PMH: HLD, HTN, OSA, recently diagnosed with stage II SCC of right tonsil  with plans for concurrent chemoradiation. He had PEG placed in IR on 06/18/23. He presented to this OP MBS for evaluation of baseline swallow function and to determine aspiration risk. Patient reported pain with swallowing since bipsy of oropharyngeal mass on 05/27/23, however it has improved since then and he is able to eat and drink. Clinical Impression: Clinical Impression: Patient presents with an oropharyngeal swallow that is largely [facility]. No penetration or aspiration occured during any phase of the swallow with any of the tested liquid or solid consistencies. Anterior hyoid excursion appeared partial in completion but patient with full epiglottic inversion and laryngeal vestibule closure. With solids, he did exhibit increased amount of vallecular residuals but  with clearance with subsequent swallows and sips of liquids. PES opening appeared [facility] and no retrograde flow of barium observed in upper esophagus. Trace amount of barium residue remained in oropharynx at level of known mass, however barium fully cleared with subsequent swallows. Barium tablet transit appeared to stall in distal esophagus.    SLP recommends referral to OP SLP secondary to planned oropharyngeal chemoradiation treatment. Recommendations/Plan: Swallowing Evaluation Recommendations Swallowing Evaluation Recommendations Recommendations: PO diet PO Diet Recommendation: Regular; Thin liquids (Level 0) Liquid Administration via: Cup; Straw Medication Administration: Whole meds with liquid Supervision: Patient able to self-feed Swallowing strategies  : Follow solids with liquids Postural changes: Position pt fully upright for meals Oral care recommendations: Oral care BID (2x/day) Treatment Plan Treatment Plan Follow-up recommendations: Outpatient SLP Recommendations Recommendations for follow up therapy are one component of a multi-disciplinary discharge planning process, led by the attending physician.  Recommendations may be updated based on patient status, additional functional criteria and insurance authorization. Assessment: Orofacial Exam: Orofacial Exam Oral Cavity: Oral Hygiene: WFL Oral Cavity - Dentition: Adequate natural dentition Orofacial Anatomy: Other (comment) Oral Motor/Sensory Function: WFL Anatomy: Anatomy: Prominent cricopharyngeus Boluses Administered: Boluses Administered Boluses Administered: Thin liquids (Level 0); Mildly thick liquids (Level 2, nectar thick); Moderately thick liquids (Level 3, honey thick); Puree; Solid  Oral Impairment Domain: Oral Impairment Domain Lip Closure: No labial escape Tongue control during bolus hold: Cohesive bolus between tongue to palatal seal Bolus preparation/mastication: Timely and efficient chewing and mashing Bolus transport/lingual motion: Brisk  tongue motion Oral residue: Complete oral clearance Location of oral residue : N/A Initiation of pharyngeal swallow : Posterior laryngeal surface of the epiglottis  Pharyngeal Impairment Domain: Pharyngeal Impairment Domain Soft palate elevation: No bolus between soft palate (SP)/pharyngeal wall (PW) Laryngeal elevation: Complete superior movement of thyroid cartilage with complete approximation of arytenoids to epiglottic petiole Anterior hyoid excursion: Partial anterior movement Epiglottic movement: Complete inversion Laryngeal vestibule closure: Complete, no air/contrast in laryngeal vestibule Pharyngeal stripping wave : Present - complete Pharyngeal contraction (A/P view only): N/A Pharyngoesophageal segment opening: Complete distension and complete duration, no obstruction of flow Tongue base retraction: No contrast between tongue base and posterior pharyngeal wall (PPW) Pharyngeal residue: Collection of residue within or on pharyngeal structures Location of pharyngeal residue: Valleculae; Pharyngeal wall; Tongue base  Esophageal Impairment Domain: Esophageal Impairment Domain Esophageal clearance upright position: Complete clearance, esophageal coating Pill: Pill Consistency administered: Thin liquids (Level 0) Thin liquids (Level 0): Impaired (see clinical impressions) Penetration/Aspiration Scale Score: Penetration/Aspiration Scale Score 1.  Material does not enter airway: Thin liquids (Level 0); Mildly thick liquids (Level 2, nectar thick); Moderately thick liquids (Level 3, honey thick); Puree; Solid; Pill Compensatory Strategies: Compensatory Strategies Compensatory strategies: No   General Information: No data recorded Diet Prior to this Study: Regular; Thin liquids (Level  0)   No data recorded  Respiratory Status: WFL   Supplemental O2: None (Room air)   History of Recent Intubation: No  Behavior/Cognition: Alert; Cooperative; Pleasant mood Self-Feeding Abilities: Able to self-feed Baseline vocal  quality/speech: Normal Volitional Cough: Able to elicit Volitional Swallow: Able to elicit Exam Limitations: No limitations Goal Planning: Consulted and agree with results and recommendations: Patient Pain: No data recorded End of Session: Start Time:SLP Start Time (ACUTE ONLY): 1325 Stop Time: SLP Stop Time (ACUTE ONLY): 1345 Time Calculation:SLP Time Calculation (min) (ACUTE ONLY): 20 min Charges: SLP Evaluations $ SLP Speech Visit: 1 Visit SLP Evaluations $Outpatient MBS Swallow: 1 Procedure SLP visit diagnosis: SLP Visit Diagnosis: Dysphagia, oropharyngeal phase (R13.12) Past Medical History: Past Medical History: Diagnosis Date  Arthritis   Cancer (HCC)   History of partial thyroidectomy STATES OVERACTIVE THYROID-- NO ISSUES SINCE AGE 68 AND NO MEDS  Hyperlipidemia   Hypertension   Hypothyroidism   Sleep apnea   Per patient he tried CPAP but could not tolerate.  Thyroid disease  Past Surgical History: Past Surgical History: Procedure Laterality Date  CIRCUMCISION  01/04/2012  Procedure: CIRCUMCISION ADULT;  Surgeon: Kathi Ludwig, MD;  Location: [facility];  Service: Urology;  Laterality: N/A;  COLONOSCOPY  04/08/2020  Vito Cirigliano  CYSTOSCOPY  09/04/2022  Procedure: CYSTOSCOPY;  Surgeon: Marcine Matar, MD;  Location: [facility];  Service: Urology;;  IR GASTROSTOMY TUBE MOD SED  06/18/2023  IR IMAGING GUIDED PORT INSERTION  06/18/2023  RADIOACTIVE SEED IMPLANT N/A 09/04/2022  Procedure: RADIOACTIVE SEED IMPLANT/BRACHYTHERAPY IMPLANT;  Surgeon: Marcine Matar, MD;  Location: [facility];  Service: Urology;  Laterality: N/A;  90 MINS  SPACE OAR INSTILLATION N/A 09/04/2022  Procedure: SPACE OAR INSTILLATION;  Surgeon: Marcine Matar, MD;  Location: [facility];  Service: Urology;  Laterality: N/A;  THYROIDECTOMY, PARTIAL  AGE 68  OVERACTIVE THYROID Angela Nevin, MA, CCC-SLP Speech Therapy CLINICAL DATA:  Dysphagia. Cough/GE reflux  disease/other secondary diagnosis EXAM: MODIFIED BARIUM SWALLOW TECHNIQUE: Radiologist in attendance for the exam. Different consistencies of barium were administered orally to the patient by the Speech Pathologist. Imaging of the pharynx was performed in the lateral projection. The radiologist was present in the fluoroscopy room for this study, providing personal supervision. FLUOROSCOPY: Radiation Exposure Index (as provided by the fluoroscopic device): 5.6 mGy Kerma COMPARISON:  None Available. FINDINGS: Vestibular  Penetration:  None seen. Aspiration:  None seen. Other:  None. IMPRESSION: No aspiration. Please refer to the Speech Pathologists report for complete details and recommendations. Electronically Signed   By: Leanna Battles M.D.   On: 06/28/2023 14:06   CODE STATUS:  Code Status History     Date Active Date Inactive Code Status Order ID Comments User Context   06/18/2023 1400 06/19/2023 0508 Full Code [id number]  Richarda Overlie, MD HOV    Questions for Most Recent Historical Code Status (Order [id number])     Question Answer   By: Consent: discussion documented in EHR            No orders of the defined types were placed in this encounter.    Future Appointments  Date Time Provider Department Center  07/23/2023  9:30 AM [facility] LINAC 4 [facility] None  07/23/2023 10:00 AM [facility] INFUSION [facility] None  07/23/2023 10:30 AM Noreene Larsson, RD [facility] None  07/26/2023  8:00 AM [facility] LINAC 4 [facility] None  07/26/2023  8:20 AM LINAC-SQUIRE [facility] None  07/27/2023  8:30 AM  [facility] LINAC 4 [facility] None  07/28/2023  8:30 AM [facility] LINAC 4 [facility] None  07/29/2023  8:30 AM [facility] LINAC 4 [facility] None  07/30/2023  7:30 AM [facility] [id number] [facility] None  07/30/2023  8:15 AM [facility] FLUSH [facility] None  07/30/2023  8:45 AM Triva Hueber, MD [facility] None  07/30/2023 10:00 AM [facility] INFUSION [facility] None  08/02/2023   8:30 AM [facility] LINAC 4 [facility] None  08/03/2023  8:30 AM [facility] LINAC 4 [facility] None  08/04/2023  8:30 AM [facility] LINAC 4 [facility] None  08/05/2023 10:15 AM [facility] LINAC 4 [facility] None  08/05/2023 10:45 AM [facility] FLUSH [facility] None  08/05/2023 11:15 AM Leanard Dimaio, MD [facility] None  08/06/2023  7:15 AM [facility] LINAC 4 [facility] None  08/06/2023  7:30 AM [facility] INFUSION [facility] None  08/06/2023  9:00 AM Noreene Larsson, RD [facility] None  08/09/2023  8:30 AM [facility] LINAC 4 [facility] None  08/10/2023  8:30 AM [facility] LINAC 4 [facility] None  08/11/2023  8:30 AM [facility] LINAC 4 [facility] None  08/12/2023  8:30 AM [facility] LINAC 4 [facility] None  08/13/2023  7:45 AM [facility] LINAC 3 [facility] None  08/13/2023  8:15 AM [facility] FLUSH [facility] None  08/13/2023  8:45 AM Ayssa Bentivegna, MD [facility] None  08/13/2023 10:00 AM [facility] INFUSION [facility] None  08/13/2023 10:30 AM Abdul, Nadiyah, LCSW [facility] None  08/16/2023  8:30 AM [facility] LINAC 4 [facility] None  08/17/2023  8:30 AM [facility] LINAC 4 [facility] None  08/18/2023  8:30 AM [facility] LINAC 4 [facility] None  08/19/2023  8:30 AM [facility] LINAC 4 [facility] None  08/20/2023  7:45 AM [facility] LINAC 4 [facility] None  08/20/2023  8:00 AM [facility] FLUSH [facility] None  08/20/2023  8:30 AM Lamica Mccart, MD [facility] None  08/20/2023  9:30 AM [facility] INFUSION [facility] None  08/20/2023 10:30 AM Noreene Larsson, RD [facility] None  08/23/2023  8:30 AM [facility] LINAC 4 [facility] None  08/24/2023  8:30 AM [facility] LINAC 4 [facility] None  08/25/2023  8:30 AM [facility] LINAC 4 [facility] None  08/26/2023  8:30 AM [facility] LINAC 4 [facility] None  08/27/2023  8:30 AM [facility] LINAC 4 [facility] None  09/16/2023  9:00 AM Breedlove Blue, Blaire L, PT [facility] None     This document was completed utilizing  speech recognition software. Grammatical errors, random word insertions, pronoun errors, and incomplete sentences are an occasional consequence of this system due to software limitations, ambient noise, and hardware issues. Any formal questions or concerns about the content, text or information contained within the body of this dictation should be directly addressed to the provider for clarification.

## 2023-07-22 NOTE — Therapy (Signed)
 OUTPATIENT SPEECH LANGUAGE PATHOLOGY ONCOLOGY EVALUATION   Patient Name: Isaac Carpenter MRN: [id number] DOB:June 24, 1950, 73 y.o., male Today's Date: 07/22/2023  PCP: [name], Betty, MD REFERRING PROVIDER: Lonie Peak, MD  END OF SESSION:  End of Session - 07/22/23 1251     Visit Number 1    Number of Visits 7    Date for SLP Re-Evaluation 10/20/23    SLP Start Time 1202    SLP Stop Time  1240    SLP Time Calculation (min) 38 min    Activity Tolerance Patient tolerated treatment well             Past Medical History:  Diagnosis Date   Arthritis    Cancer (HCC)    History of partial thyroidectomy STATES OVERACTIVE THYROID-- NO ISSUES SINCE AGE 80 AND NO MEDS   Hyperlipidemia    Hypertension    Hypothyroidism    Sleep apnea    Per patient he tried CPAP but could not tolerate.   Thyroid disease    Past Surgical History:  Procedure Laterality Date   CIRCUMCISION  01/04/2012   Procedure: CIRCUMCISION ADULT;  Surgeon: Kathi Ludwig, MD;  Location: [facility];  Service: Urology;  Laterality: N/A;   COLONOSCOPY  04/08/2020   Vito Cirigliano   CYSTOSCOPY  09/04/2022   Procedure: CYSTOSCOPY;  Surgeon: Marcine Matar, MD;  Location: [facility];  Service: Urology;;   IR GASTROSTOMY TUBE MOD SED  06/18/2023   IR IMAGING GUIDED PORT INSERTION  06/18/2023   RADIOACTIVE SEED IMPLANT N/A 09/04/2022   Procedure: RADIOACTIVE SEED IMPLANT/BRACHYTHERAPY IMPLANT;  Surgeon: Marcine Matar, MD;  Location: [facility];  Service: Urology;  Laterality: N/A;  90 MINS   SPACE OAR INSTILLATION N/A 09/04/2022   Procedure: SPACE OAR INSTILLATION;  Surgeon: Marcine Matar, MD;  Location: [facility];  Service: Urology;  Laterality: N/A;   THYROIDECTOMY, PARTIAL  AGE 80   OVERACTIVE THYROID   Patient Active Problem List   Diagnosis Date Noted   Fatigue 07/16/2023   Situational anxiety 07/09/2023   Encounter for  antineoplastic chemotherapy 07/09/2023   Dysphagia 07/09/2023   Port-A-Cath in place 07/05/2023   Cancer associated pain 06/11/2023   Coordination of complex care 06/11/2023   Malignant neoplasm of tonsillar fossa (HCC) 06/08/2023   Malignant neoplasm of prostate (HCC) 07/17/2022   Hyperlipidemia 05/11/2021   Morbid obesity (HCC)-BMI 37.8 with DM II,HLD,OSA,HTN 05/09/2021   Hypertension, essential, benign 07/23/2020   OSA (obstructive sleep apnea) 07/23/2020   Type 2 diabetes mellitus with other specified complication (HCC) 01/31/2020   BPH associated with nocturia 01/31/2020   Hypothyroidism, postsurgical 01/31/2020    ONSET DATE: see "pertinent history"   REFERRING DIAG: Malignant Neoplasm of tonsillar fossa  THERAPY DIAG:  Dysphagia, unspecified type  Rationale for Evaluation and Treatment: Rehabilitation  SUBJECTIVE:   SUBJECTIVE STATEMENT: "It hurts too much so I used the tube."  Pt accompanied by: significant other Vernona Rieger  PERTINENT HISTORY:  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 He presented to his PCP on 05/17/23 with complaints of right sided jaw/maxillary pain when coughing. 05/17/23 He underwent a CT neck which revealed an ulcerated enhancing mass involving the superior oropharynx and soft palate/uvula measuring 3.4 x 3.0 x 3.6 cm in the greatest extent that is concerning for malignancy. No evidence of cervical lymphadenopathy was indicated on scan. 05/27/23 He saw Dr. Irene Pap. She completed a laryngoscopy with biopsies  which revealed Invasive moderately differentiated SCC, p 16 +. 06/04/23 visit with Dr. Irene Pap for worsening symptoms of odynophagia and dysphagia. He was unable to maintain adequate nutrition. 06/07/23 PET revealing a right oropharyngeal tumor >4cm, likely b/l level II adenopathy, no distant metastases. Consult with Dr. Basilio Cairo 06/08/23, patient reported extreme pain when swallowing that prevents him from  eating or swallowing, PEG tube scheduled. 2/20 consult with Dr. Arlana Pouch. He will receive chemotherapy/radiation. Treatment plan:  He will receive 35 fractions of radiation to his right Tonsil and bilateral neck with weekly chemotherapy which started on 07/12/23 and will complete 08/27/23. Pretreatment procedures: PEG/PAC 06/18/23. MBSS 06/28/23. When he presented for consult he had severe dysphagia and PEG was placed quickly. He did recover from the dysphagia (patient thinks it was related to biopsy pain).  PAIN:  Are you having pain? No and odynophagia   8/10  FALLS: Has patient fallen in last 6 months?  No  LIVING ENVIRONMENT: Lives with: lives with their partner Lives in: House/apartment  PLOF:  Level of assistance: Independent with ADLs, Independent with IADLs Employment: Full-time employment  PATIENT GOALS: Maintain WNL swallowing  OBJECTIVE:  Note: Objective measures were completed at Evaluation unless otherwise noted. DIAGNOSTIC FINDINGS: See "Pertinent history" above  INSTRUMENTAL SWALLOW STUDY FINDINGS (MBSS) 06/28/23 Clinical Impression: Patient presents with an oropharyngeal swallow that is largely [facility]. No penetration or aspiration occured during any phase of the swallow with any of the tested liquid or solid consistencies. Anterior hyoid excursion appeared partial in completion but patient with full epiglottic inversion and laryngeal vestibule closure. With solids, he did exhibit increased amount of vallecular residuals but with clearance with subsequent swallows and sips of liquids. PES opening appeared [facility] and no retrograde flow of barium observed in upper esophagus. Trace amount of barium residue remained in oropharynx at level of known mass, however barium fully cleared with subsequent swallows. Barium tablet transit appeared to stall in distal esophagus.    SLP recommends referral to OP SLP secondary to planned oropharyngeal chemoradiation treatment.    Recommendations/Plan: Swallowing Evaluation Recommendations PO Diet Recommendation: Regular; Thin liquids (Level 0) Liquid Administration via: Cup; Straw Medication Administration: Whole meds with liquid Supervision: Patient able to self-feed Swallowing strategies  : Follow solids with liquids Postural changes: Position pt fully upright for meals Oral care recommendations: Oral care BID (2x/day)  COGNITION: Overall cognitive status: Within functional limits for tasks assessed  LANGUAGE: Receptive and Expressive language appeared WNL.  ORAL MOTOR EXAMINATION: Overall status: WFL  MOTOR SPEECH: Overall motor speech: Appears intact  SUBJECTIVE DYSPHAGIA REPORTS:  Date of onset: February 2025 Reported symptoms: coughing with both solids and liquids  Current diet:  NPO by choice due to odynophagia  Co-morbid voice changes: No  SEE RESULTS FROM MBS ABOVE. Pt thinks dysphagia was mainly due to pain from biopsy.  FACTORS WHICH MAY INCREASE RISK OF ADVERSE EVENT IN PRESENCE OF ASPIRATION:  General health: well appearing  Risk factors: tube present (and pt undergoing ChRT)   CLINICAL SWALLOW ASSESSMENT:   Dentition: missing dentition  Vocal quality at baseline: normal Patient directly observed with POs: Yes: dysphagia 1 (puree) and thin liquids  Feeding: able to feed self Liquids provided by: cup Oral phase signs and symptoms:  none noted today with items observed Pharyngeal phase signs and symptoms:  none noted today with items observed  TREATMENT DATE:   07/22/23: Research states the risk for dysphagia increases due to radiation and/or chemotherapy treatment due to a variety of factors, so SLP educated the pt about the possibility of reduced/limited ability for PO intake during rad tx. SLP also educated pt regarding possible changes to swallowing musculature  after rad tx, and why adherence to dysphagia HEP provided today and PO consumption was necessary to inhibit muscle fibrosis following rad tx and to mitigate muscle disuse atrophy. SLP informed pt why this would be detrimental to their swallowing status and to their pulmonary health. Pt demonstrated understanding of these things to SLP. SLP encouraged pt to safely eat and drink as deep into their radiation/chemotherapy as possible to provide the best possible long-term swallowing outcome for pt.  SLP then developed an individualized HEP for pt involving oral and pharyngeal strengthening and ROM and pt was instructed how to perform these exercises, including SLP demonstration. After SLP demonstration, pt return demonstrated each exercise. SLP ensured pt performance was correct prior to educating pt on next exercise. Pt required min-mod cues faded to modified independent to perform HEP. Pt was instructed to complete this program 5-7 days/week, at least 20 reps a day until 6 months after his last day of rad tx, and then x2/week after that, indefinitely. Among other modifications for days when pt cannot functionally swallow, SLP also suggested pt to perform only non-swallowing tasks on the handout/HEP, and if necessary to cycle through the swallowing portion so the full program of exercises can be completed instead of fatiguing on one of the swallowing exercises and being unable to perform the other swallowing exercises. SLP instructed that swallowing exercises should then be added back into the regimen as pt is able to do so.   PATIENT EDUCATION: Education details: late effects head/neck radiation on swallow function, HEP procedure, and modification to HEP when difficulty experienced with swallowing during and after radiation course Person educated: Patient Education method: Explanation, Demonstration, Verbal cues, and Handouts Education comprehension: verbalized understanding, returned demonstration, verbal cues  required, and needs further education   ASSESSMENT:  CLINICAL IMPRESSION: Patient is a 73 y.o. M who was seen today for assessment of swallowing as they undergo radiation/chemoradiation therapy. Today pt ate items from Dys I and drank thin liquids without overt s/s oral or pharyngeal difficulty. At this time pt swallowing is deemed WNL/WFL with these POs. No oral or overt s/sx pharyngeal deficits, including aspiration were observed. There are no overt s/s aspiration PNA observed by SLP nor any reported by pt at this time. Data indicate that pt's swallow ability will likely decrease over the course of radiation/chemoradiation therapy and could very well decline over time following the conclusion of that therapy due to muscle disuse atrophy and/or muscle fibrosis. Pt will cont to need to be seen by SLP in order to assess safety of PO intake, assess the need for recommending any objective swallow assessment, and ensuring pt is correctly completing the individualized HEP.  OBJECTIVE IMPAIRMENTS: include dysphagia. These impairments are limiting patient from safety when swallowing. Factors affecting potential to achieve goals and functional outcome are  none noted today . Patient will benefit from skilled SLP services to address above impairments and improve overall function.  REHAB POTENTIAL: Good   GOALS: Goals reviewed with patient? No  SHORT TERM GOALS: Target: 3rd total session   1. Pt will compelte HEP with modified independence in 2 sessions Baseline: Goal status: INITIAL   2.  pt will tell SLP why pt  is completing HEP with modified independence Baseline:  Goal status: INITIAL   3.  pt will describe 3 overt s/s aspiration PNA with modified independence Baseline:  Goal status: INITIAL   4.  pt will tell SLP how a food journal could hasten return to a more normalized diet Baseline:  Goal status: INITIAL     LONG TERM GOALS: Target: 7th total session   1.  pt will complete HEP  with independence over two visits Baseline:  Goal status: INITIAL   2.  pt will describe how to modify HEP over time, and the timeline associated with reduction in HEP frequency with modified independence over two sessions Baseline:  Goal status: INITIAL     PLAN:   SLP FREQUENCY:  once approx every 4 weeks   SLP DURATION:  7 sessions   PLANNED INTERVENTIONS: Aspiration precaution training, Pharyngeal strengthening exercises, Diet toleration management , Trials of upgraded texture/liquids, SLP instruction and feedback, Compensatory strategies, and Patient/family education, [phone] (treatment of swallowing dysfunction and/or oral function for feeding)   Alleta Avery, CCC-SLP 07/22/2023, 1:04 PM

## 2023-07-22 NOTE — Therapy (Addendum)
 OUTPATIENT PHYSICAL THERAPY HEAD AND NECK BASELINE EVALUATION   Patient Name: Isaac Carpenter MRN: [id number] DOB:12-05-1950, 73 y.o., male Today's Date: 07/22/2023  END OF SESSION:  PT End of Session - 07/22/23 0942     Visit Number 1    Number of Visits 2    Date for PT Re-Evaluation 09/30/23    PT Start Time 0901    PT Stop Time 0939    PT Time Calculation (min) 38 min    Activity Tolerance Patient tolerated treatment well    Behavior During Therapy WFL for tasks assessed/performed             Past Medical History:  Diagnosis Date   Arthritis    Cancer (HCC)    History of partial thyroidectomy STATES OVERACTIVE THYROID-- NO ISSUES SINCE AGE 85 AND NO MEDS   Hyperlipidemia    Hypertension    Hypothyroidism    Sleep apnea    Per patient he tried CPAP but could not tolerate.   Thyroid disease    Past Surgical History:  Procedure Laterality Date   CIRCUMCISION  01/04/2012   Procedure: CIRCUMCISION ADULT;  Surgeon: Kathi Ludwig, MD;  Location: [facility];  Service: Urology;  Laterality: N/A;   COLONOSCOPY  04/08/2020   Vito Cirigliano   CYSTOSCOPY  09/04/2022   Procedure: CYSTOSCOPY;  Surgeon: Marcine Matar, MD;  Location: [facility];  Service: Urology;;   IR GASTROSTOMY TUBE MOD SED  06/18/2023   IR IMAGING GUIDED PORT INSERTION  06/18/2023   RADIOACTIVE SEED IMPLANT N/A 09/04/2022   Procedure: RADIOACTIVE SEED IMPLANT/BRACHYTHERAPY IMPLANT;  Surgeon: Marcine Matar, MD;  Location: [facility];  Service: Urology;  Laterality: N/A;  90 MINS   SPACE OAR INSTILLATION N/A 09/04/2022   Procedure: SPACE OAR INSTILLATION;  Surgeon: Marcine Matar, MD;  Location: [facility];  Service: Urology;  Laterality: N/A;   THYROIDECTOMY, PARTIAL  AGE 85   OVERACTIVE THYROID   Patient Active Problem List   Diagnosis Date Noted   Fatigue 07/16/2023   Situational anxiety 07/09/2023   Encounter for  antineoplastic chemotherapy 07/09/2023   Dysphagia 07/09/2023   Port-A-Cath in place 07/05/2023   Cancer associated pain 06/11/2023   Coordination of complex care 06/11/2023   Malignant neoplasm of tonsillar fossa (HCC) 06/08/2023   Malignant neoplasm of prostate (HCC) 07/17/2022   Hyperlipidemia 05/11/2021   Morbid obesity (HCC)-BMI 37.8 with DM II,HLD,OSA,HTN 05/09/2021   Hypertension, essential, benign 07/23/2020   OSA (obstructive sleep apnea) 07/23/2020   Type 2 diabetes mellitus with other specified complication (HCC) 01/31/2020   BPH associated with nocturia 01/31/2020   Hypothyroidism, postsurgical 01/31/2020    PCP: Betty [name], MD  REFERRING PROVIDER: Lonie Peak, MD  REFERRING DIAG: C09.0 (ICD-10-CM) - Malignant neoplasm of tonsillar fossa (HCC)   THERAPY DIAG:  Abnormal posture  Malignant neoplasm of tonsillar fossa (HCC)  Rationale for Evaluation and Treatment: Rehabilitation  ONSET DATE: 05/27/23 officially diagnosed, pain started Sept 2024  SUBJECTIVE:     SUBJECTIVE STATEMENT: Patient reports they are here today to be seen by their medical team for newly diagnosed cancer of oropharynx.    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 He presented to his PCP on 05/17/23 with complaints of right sided jaw/maxillary pain when coughing. 05/17/23 He underwent a CT neck which revealed an ulcerated enhancing mass involving the superior oropharynx and soft palate/uvula measuring 3.4  x 3.0 x 3.6 cm in the greatest extent that is concerning for malignancy. No evidence of cervical lymphadenopathy was indicated on scan. 05/27/23 He saw Dr. Irene Pap. She completed a laryngoscopy with biopsies which revealed Invasive moderately differentiated SCC, p 16 +. 06/04/23 visit with Dr. Irene Pap for worsening symptoms of odynophagia and dysphagia. He was unable to maintain adequate nutrition. 06/07/23 PET revealing a right  oropharyngeal tumor >4cm, likely b/l level II adenopathy, no distant metastases. He will receive 35 fractions of radiation to his right Tonsil and bilateral neck with weekly chemotherapy which started on 07/12/23 and will complete 08/27/23. PEG/PAC 2/28/25MBSS 06/28/23. When he presented for consult he had severe dysphagia and PEG was placed quickly. He did recover from the dysphagia (patient thinks it was related to biopsy pain).  PATIENT GOALS:   to be educated about the signs and symptoms of lymphedema and learn post op HEP.   PAIN:  Are you having pain? No no pain at rest but does have pain when he tries to swallow  PRECAUTIONS: Active CA  RED FLAGS: None   WEIGHT BEARING RESTRICTIONS: No  FALLS:  Has patient fallen in last 6 months? No Does the patient have a fear of falling that limits activity? No Is the patient reluctant to leave the house due to a fear of falling?No  LIVING ENVIRONMENT: Patient lives with: wife Lives in: House/apartment Has following equipment at home: Single point cane and Grab bars  OCCUPATION: works on a farm, grape vines, blueberry bushes, wood working shop  LEISURE: does not formally exercise but was very active on the farm  PRIOR LEVEL OF FUNCTION: Independent   OBJECTIVE: Note: Objective measures were completed at Evaluation unless otherwise noted.  COGNITION: Overall cognitive status: Within functional limits for tasks assessed                  POSTURE:  Forward head and rounded shoulders posture  30 SEC SIT TO STAND: 10 reps in 30 sec without use of UEs which is  Below average for patient's age  SHOULDER AROM:   Limited bilaterally but functional    CERVICAL AROM:   Percent limited  Flexion WFL  Extension 50% limited  Right lateral flexion 75% limited  Left lateral flexion 75% limited  Right rotation 50% limited  Left rotation 50% limited    (Blank rows=not tested)   NECK DISABILITY INDEX SCORE: 40% which is indicitive of  moderate disability  GAIT: Assessed: Yes Assistance needed: Independent Ambulation Distance: 10 feet Assistive Device: none Gait pattern: WFL Ambulation surface: Level  PATIENT EDUCATION:  Education details: Neck ROM, importance of posture when sitting, standing and lying down, deep breathing, walking program and importance of staying active throughout treatment, CURE article on staying active, "Why exercise?" flyer, lymphedema and PT info Person educated: Patient Education method: Explanation, Demonstration, Handout Education comprehension: Patient verbalized understanding and returned demonstration  HOME EXERCISE PROGRAM: Patient was instructed today in a home exercise program today for head and neck range of motion exercises. These included active cervical flexion, active cervical extension, active cervical rotation to each direction, upper trap stretch, and shoulder retraction. Patient was encouraged to do these 2-3 times a day, holding for 5 sec each and completing for 5 reps. Pt was educated that once this becomes easier then hold the stretches for 30-60 seconds.    ASSESSMENT:  CLINICAL IMPRESSION: Pt arrives to PT with recently diagnosed oropharyngeal cancer. He will receive 35 fractions of radiation to his right Tonsil  and bilateral neck with weekly chemotherapy which started on 07/12/23 and will complete 08/27/23. Pt's cervical ROM was very limited at baseline in all directions except for flexion. Educated pt about signs and symptoms of lymphedema as well as anatomy and physiology of lymphatic system. Educated pt in importance of staying as active as possible throughout treatment to decrease fatigue as well as head and neck ROM exercises to decrease loss of ROM. Will see pt after completion of radiation to reassess ROM and assess for lymphedema and to determine therapy needs at that time.  Pt will benefit from skilled therapeutic intervention to improve on the following deficits:  Decreased knowledge of precautions and postural dysfunction. Other deficits: decreased ROM  PT treatment/interventions: ADL/self-care home management, pt/family education, therapeutic exercise. Other interventions 97164- PT Re-evaluation, 97110-Therapeutic exercises, 97530- Therapeutic activity, [id number]- Neuromuscular re-education, 97535- Self Care, [id number]- Manual therapy, and 97760- Orthotic Fit/training  REHAB POTENTIAL: Good  CLINICAL DECISION MAKING: Evolving/moderate complexity  EVALUATION COMPLEXITY: Moderate   GOALS: Goals reviewed with patient? YES  LONG TERM GOALS: (STG=LTG)   Name Target Date  Goal status  1 Patient will be able to verbalize understanding of a home exercise program for cervical range of motion, posture, and walking.   Baseline:  No knowledge 07/22/2023 Achieved at eval  2 Patient will be able to verbalize understanding of proper sitting and standing posture. Baseline:  No knowledge 07/22/2023 Achieved at eval  3 Patient will be able to verbalize understanding of lymphedema risk and availability of treatment for this condition Baseline:  No knowledge 07/22/2023 Achieved at eval  4 Pt will demonstrate a return to full cervical ROM and function post operatively compared to baselines and not demonstrate any signs or symptoms of lymphedema.  Baseline: See objective measurements taken today. 09/30/23 New    PLAN:  PT FREQUENCY/DURATION: EVAL and 1 follow up appointment.   PLAN FOR NEXT SESSION: will reassess 2 weeks after completion of radiation to determine needs.  Patient will follow up at outpatient cancer rehab 2 weeks after completion of radiation.  If the patient requires physical therapy at that time, a specific plan will be dictated and sent to the referring physician for approval. The patient was educated today on appropriate basic range of motion exercises to begin now and continue throughout radiation and educated on the signs and symptoms of lymphedema.  Patient verbalized good understanding.     Physical Therapy Information for During and After Head/Neck Cancer Treatment: Lymphedema is a swelling condition that you may be at risk for in your neck and/or face if you have radiation treatment to the area and/or if you have surgery that includes removing lymph nodes.  There is treatment available for this condition and it is not life-threatening.  Contact your physician or physical therapist with concerns. An excellent resource for those seeking information on lymphedema is the National Lymphedema Network's website.  It can be accessed at www.lymphnet.org If you notice swelling in your neck or face at any time following surgery (even if it is many years from now), please contact your doctor or physical therapist to discuss this.  Lymphedema can be treated at any time but it is easier for you if it is treated early on. If you have had surgery to your neck, please check with your surgeon about how soon to start doing neck range of motion exercises.  If you are not having surgery, I encourage you to start doing neck range of motion exercises today and continue these  while undergoing treatment, UNLESS you have irritation of your skin or soft tissue that is aggravated by doing them.  These exercises are intended to help you prevent loss of range of motion and/or to gain range of motion in your neck (which can be limited by tightening effects of radiation), and NOT to aggravate these tissues if they develop sensitivities from treatment. Neck range of motion exercises should be done to the point of feeling a GENTLE, TOLERABLE stretch only.  You are encouraged to start a walking or other exercise program tomorrow and continue this as much as you are able through and after treatment.  Please feel free to call me with any questions. Leonette Most, PT, CLT Physical Therapist and Certified Lymphedema Therapist [facility] [address],  [address], [place], [place] [address] [phone] [email or web address]  WALKING  Walking is a great form of exercise to increase your strength, endurance and overall fitness.  A walking program can help you start slowly and gradually build endurance as you go.  Everyone's ability is different, so each person's starting point will be different.  You do not have to follow them exactly.  The are just samples. You should simply find out what's right for you and stick to that program.   In the beginning, you'll start off walking 2-3 times a day for short distances.  As you get stronger, you'll be walking further at just 1-2 times per day.  A. You Can Walk For A Certain Length Of Time Each Day    Walk 5 minutes 3 times per day.  Increase 2 minutes every 2 days (3 times per day).  Work up to 25-30 minutes (1-2 times per day).   Example:   Day 1-2 5 minutes 3 times per day   Day 7-8 12 minutes 2-3 times per day   Day 13-14 25 minutes 1-2 times per day  B. You Can Walk For a Certain Distance Each Day     Distance can be substituted for time.    Example:   3 trips to mailbox (at road)   3 trips to corner of block   3 trips around the block  C. Go to local high school and use the track.    Walk for distance ____ around track  Or time ____ minutes  D. Walk ____ Jog ____ Run ___   Why exercise?  So many benefits! Here are SOME of them: Heart health, including raising your good cholesterol level and reducing heart rate and blood pressure Lung health, including improved lung capacity It burns fats, and most of [place] can stand to be leaner, whether or not we are overweight. It increases the body's natural painkillers and mood elevators, so makes you feel better. Not only makes you feel better, but look better too Improves sleep Takes a bite out of stress May decrease your risk of many types of cancer If you are currently undergoing cancer treatment, exercise may improve your  ability to tolerate treatments including chemotherapy. For everybody, it can improve your energy level. Those with cancer-related fatigue report a 40-50% reduction in this symptom when exercising regularly. If you are a survivor of breast, colon, or prostate cancer, it may decrease your risk of a recurrence. (This may hold for other cancers too, but so far we have data just for these three types.)  How to exercise: Get your doctor's okay. Pick something you enjoy doing, like walking, Zumba, biking, swimming, or whatever. Start at low  intensity and time, then gradually increase.  (See walking program handout.) Set a goal to achieve over time.  The American Cancer Society, American Heart Association, and U.S. Dept. of Health and Human Services recommend 150 minutes of moderate exercise, 75 minutes of vigorous exercise, or a combination of both per week. This should be done in episodes at least 10 minutes long, spread throughout the week.  Need help being motivated? Pick something you enjoy doing, because you'll be more inclined to stick with that activity than something that feels like a chore. Do it with a friend so that you are accountable to each other. Schedule it into your day. Place it on your calendar and keep that appointment just like you do any appointment that you make. Join an exercise group that meets at a specific time.  That way, you have to show up on time, and that makes it harder to procrastinate about doing your workout.  It also keeps you accountable--people begin to expect you to be there. Join a gym where you feel comfortable and not intimidated, at the right cost. Sign up for something that you'll need to be in shape for on a specific date, like a 1K or a 5K to walk or run, a 20 or 30 mile bike ride, a mud run or something like that. If the date is looming, you know you'll need to train to be ready for it.  An added benefit is that many of these are fundraisers for good  causes. If you've already paid for a gym membership, group exercise class or event, you might as well work out, so you haven't wasted your money!    [facility], PT 07/22/2023, 9:50 AM

## 2023-07-22 NOTE — Assessment & Plan Note (Signed)
 Experiencing significant anxiety with symptoms of racing thoughts. No prior history of anxiolytic use. Alprazolam prescribed on 07/15/2023 for short-term relief, with evaluation for long-acting medication if symptoms persist.  - Evaluate anxiety levels next week to consider long-acting anxiolytic if needed

## 2023-07-22 NOTE — Patient Instructions (Signed)
 SWALLOWING EXERCISES Do these 5-6 days/week until 6 months after your last day of radiation, then 2 days per week afterwards You can use 1-2 drops of liquid to help you swallow, if your mouth gets dry  Effortful Swallows - Press your tongue against the roof of your mouth for 3 seconds, then swallow as hard as you can - Do at least 20 reps/day, in sets of 5-10  Masako Swallow - swallow with your tongue sticking out - Stick tongue out past your lips and gently bite tongue with your teeth - Swallow, while holding your tongue with your teeth - Do at least 20 reps/day, in sets of 5-10   Shaker Exercise - head lift - Lie flat on your back in your bed, the floor, or a couch  - Raise your head and look at your feet - KEEP YOUR SHOULDERS DOWN - HOLD FOR 45-60 SECONDS, then lower your head back down - Repeat 3 times, 2-3 times a day  [employer] - "squeeze swallow" exercise - Swallow, and squeeze tight to keep your Adam's Apple up - Hold the squeeze for 5-7 seconds - then relax - Do at least 20 reps/day, in sets of 5-10

## 2023-07-22 NOTE — Assessment & Plan Note (Signed)
 He has been having dysphagia.  Encouraged him to use feeding tube for adequate hydration and nutrition.  Continue to eat and drink as much as he can through the mouth to preserve muscle function.  We advised him to crush or dissolve tablets and take them via feeding tube, where needed.

## 2023-07-22 NOTE — Assessment & Plan Note (Addendum)
 Please review oncology history for additional details and timeline of events.  Symptoms include severe pain and dysphagia since mid-November, progressively worsening.   cT3,cN2,cM0,p16+ tumor, Stage II disease.   His case was discussed in tumor conference on 06/09/2023.  Given the extent of disease, consensus opinion is to proceed with concurrent chemoradiation. We have discussed about role of cisplatin being a radiosensitizer in the treatment of head and neck cancer.  We have discussed about the curative intent of chemoradiation for this patient.  Patient was willing to proceed with weekly cisplatin after discussing risk versus benefits and side effect profile.  He started cycle 1 of cisplatin at a dose of 40 mg/m on 07/09/2023.  Plan was to continue cisplatin weekly during the course of radiation.  He began radiation treatments from 07/12/2023.  Last week chemotherapy had to be held because of severe fatigue with declined performance status of 3.  This week he is doing much better and he is in good spirits.  CBCD is entirely unremarkable today.  Creatinine stable at 0.75.  Will resume chemotherapy and proceed with cycle 3 of cisplatin today at 40 mg/m.  - Ensure home hydration (80-90 oz non-caffeinated fluids daily).  Patient's wife was encouraged to start using feeding tube as suggested by the nutritionist and also to use feeding tube for hydration.  - Monitor and replace electrolytes as needed  RTC in 1 week for labs, follow-up and continuation of chemotherapy.

## 2023-07-22 NOTE — Progress Notes (Unsigned)
 Oncology Nurse Navigator Documentation   Mr. Isaac Carpenter presented for head and neck MDC today. He is tolerating treatment. He does report pain when swallowing. I encouraged him to take his prescribed pain medication about 45 minutes before eating so that he can swallow food to maintain his nutrition. Mr. Isaac Carpenter and his wife know to call me if they have any concerns as he progresses through his treatment.   Hedda Slade RN, BSN, OCN Head & Neck Oncology Nurse Navigator [facility] Phone # [phone]  Fax # [phone]

## 2023-07-23 ENCOUNTER — Encounter: Payer: Self-pay | Admitting: Radiation Oncology

## 2023-07-23 ENCOUNTER — Inpatient Hospital Stay

## 2023-07-23 ENCOUNTER — Inpatient Hospital Stay: Admitting: Dietician

## 2023-07-23 ENCOUNTER — Other Ambulatory Visit: Payer: Self-pay

## 2023-07-23 ENCOUNTER — Encounter: Payer: Self-pay | Admitting: Oncology

## 2023-07-23 ENCOUNTER — Ambulatory Visit
Admission: RE | Admit: 2023-07-23 | Discharge: 2023-07-23 | Disposition: A | Discharge status: Home / Self Care | Source: Ambulatory Visit | Attending: Radiation Oncology | Admitting: Radiation Oncology

## 2023-07-23 VITALS — BP 134/96 | HR 79 | Temp 98.1°F | Resp 16

## 2023-07-23 DIAGNOSIS — G893 Neoplasm related pain (acute) (chronic): Secondary | ICD-10-CM | POA: Diagnosis not present

## 2023-07-23 DIAGNOSIS — C090 Malignant neoplasm of tonsillar fossa: Secondary | ICD-10-CM

## 2023-07-23 DIAGNOSIS — Z7982 Long term (current) use of aspirin: Secondary | ICD-10-CM | POA: Diagnosis not present

## 2023-07-23 DIAGNOSIS — Z79899 Other long term (current) drug therapy: Secondary | ICD-10-CM | POA: Diagnosis not present

## 2023-07-23 DIAGNOSIS — Z87891 Personal history of nicotine dependence: Secondary | ICD-10-CM | POA: Diagnosis not present

## 2023-07-23 DIAGNOSIS — Z95828 Presence of other vascular implants and grafts: Secondary | ICD-10-CM | POA: Diagnosis not present

## 2023-07-23 DIAGNOSIS — R1310 Dysphagia, unspecified: Secondary | ICD-10-CM | POA: Diagnosis not present

## 2023-07-23 DIAGNOSIS — M546 Pain in thoracic spine: Secondary | ICD-10-CM | POA: Diagnosis not present

## 2023-07-23 DIAGNOSIS — Z5111 Encounter for antineoplastic chemotherapy: Secondary | ICD-10-CM | POA: Diagnosis not present

## 2023-07-23 DIAGNOSIS — Z79891 Long term (current) use of opiate analgesic: Secondary | ICD-10-CM | POA: Diagnosis not present

## 2023-07-23 DIAGNOSIS — Z510 Encounter for antineoplastic radiation therapy: Secondary | ICD-10-CM | POA: Diagnosis not present

## 2023-07-23 DIAGNOSIS — Z923 Personal history of irradiation: Secondary | ICD-10-CM | POA: Diagnosis not present

## 2023-07-23 DIAGNOSIS — Z931 Gastrostomy status: Secondary | ICD-10-CM | POA: Diagnosis not present

## 2023-07-23 LAB — RAD ONC ARIA SESSION SUMMARY
Course Elapsed Days: 11
Plan Fractions Treated to Date: 10
Plan Prescribed Dose Per Fraction: 2 Gy
Plan Total Fractions Prescribed: 35
Plan Total Prescribed Dose: 70 Gy
Reference Point Dosage Given to Date: 20 Gy
Reference Point Session Dosage Given: 2 Gy
Session Number: 10

## 2023-07-23 MED ORDER — SODIUM CHLORIDE 0.9 % IV SOLN
40.0000 mg/m2 | Freq: Once | INTRAVENOUS | Status: AC
  Administered 2023-07-23: 100 mg via INTRAVENOUS
  Filled 2023-07-23: qty 100

## 2023-07-23 MED ORDER — SODIUM CHLORIDE 0.9% FLUSH
10.0000 mL | INTRAVENOUS | Status: DC | PRN
  Administered 2023-07-23: 10 mL

## 2023-07-23 MED ORDER — PALONOSETRON HCL INJECTION 0.25 MG/5ML
0.2500 mg | Freq: Once | INTRAVENOUS | Status: AC
  Administered 2023-07-23: 0.25 mg via INTRAVENOUS
  Filled 2023-07-23: qty 5

## 2023-07-23 MED ORDER — SODIUM CHLORIDE 0.9 % IV SOLN
150.0000 mg | Freq: Once | INTRAVENOUS | Status: AC
  Administered 2023-07-23: 150 mg via INTRAVENOUS
  Filled 2023-07-23: qty 150

## 2023-07-23 MED ORDER — HEPARIN SOD (PORK) LOCK FLUSH 100 UNIT/ML IV SOLN
500.0000 [IU] | Freq: Once | INTRAVENOUS | Status: AC | PRN
Start: 2023-07-23 — End: 2023-07-23
  Administered 2023-07-23: 500 [IU]

## 2023-07-23 MED ORDER — POTASSIUM CHLORIDE IN NACL 20-0.9 MEQ/L-% IV SOLN
Freq: Once | INTRAVENOUS | Status: AC
  Filled 2023-07-23: qty 1000

## 2023-07-23 MED ORDER — MAGNESIUM SULFATE 2 GM/50ML IV SOLN
2.0000 g | Freq: Once | INTRAVENOUS | Status: AC
  Administered 2023-07-23: 2 g via INTRAVENOUS
  Filled 2023-07-23: qty 50

## 2023-07-23 MED ORDER — SODIUM CHLORIDE 0.9 % IV SOLN: INTRAVENOUS | Status: DC

## 2023-07-23 MED ORDER — DEXAMETHASONE SODIUM PHOSPHATE 10 MG/ML IJ SOLN
10.0000 mg | Freq: Once | INTRAMUSCULAR | Status: AC
  Administered 2023-07-23: 10 mg via INTRAVENOUS
  Filled 2023-07-23: qty 1

## 2023-07-23 NOTE — Progress Notes (Signed)
 Nutrition Follow-up:  Pt with stage II SCC of right tonsil, p16 positive. He is planning to receive concurrent chemoradiation (first chemo 3/21). First RT planned 3/24. Patient is under the care of Dr. Basilio Cairo and Dr. Arlana Pouch.   S/p Gtube 2/28 DME: Julianne Rice  Met with patient and wife in infusion. Patient in good spirits today. He reports feeling 80% better than last week. It would be 100% if he were able to taste. Patient is not eating or drinking orally secondary to lack of taste. He wishes he could eat. Patient is not doing baking soda salt water rinses. He has been relying on PEG for nutrition/hydration. Currently tolerating 6 cartons split over 4 feedings (goal is 8/day). Wife reports pt complains of feeling bloated after 500 ml (2 cartons) bolus with 16 ounce water flush. Patient denies diarrhea, constipation, nausea, vomiting.    Medications: reviewed  Labs: Na 134, glucose 132, BUN 24  Anthropometrics: Wt 275 lb 3.2 oz on 4/3  3/27 - 273 lb 1.6 oz   Estimated Energy Needs  Kcals: 2800-3180 Protein: 140-153 Fluid: >2.8 L  NUTRITION DIAGNOSIS: Unintended wt loss - stable x 1 week (started TF)   INTERVENTION:  Continue working to increase TF to goal - recommend giving additional water flushes in between bolus feedings vs with to improve abdominal bloating  Educated on benefits of baking soda salt water rinses, recommend rinsing several times daily and before trying po Provided contact information to reorder formula/supplies from DME  Support and encouragement   MONITORING, EVALUATION, GOAL: wt trends, intake, TF   NEXT VISIT: Friday April 11 during infusion

## 2023-07-23 NOTE — Patient Instructions (Signed)

## 2023-07-26 ENCOUNTER — Other Ambulatory Visit (INDEPENDENT_AMBULATORY_CARE_PROVIDER_SITE_OTHER): Payer: Self-pay | Admitting: Otolaryngology

## 2023-07-26 ENCOUNTER — Ambulatory Visit
Admission: RE | Admit: 2023-07-26 | Discharge: 2023-07-26 | Disposition: A | Discharge status: Home / Self Care | Source: Ambulatory Visit | Attending: Radiation Oncology

## 2023-07-26 ENCOUNTER — Other Ambulatory Visit: Payer: Self-pay

## 2023-07-26 DIAGNOSIS — Z95828 Presence of other vascular implants and grafts: Secondary | ICD-10-CM | POA: Diagnosis not present

## 2023-07-26 DIAGNOSIS — Z5111 Encounter for antineoplastic chemotherapy: Secondary | ICD-10-CM | POA: Diagnosis not present

## 2023-07-26 DIAGNOSIS — Z931 Gastrostomy status: Secondary | ICD-10-CM | POA: Diagnosis not present

## 2023-07-26 DIAGNOSIS — Z87891 Personal history of nicotine dependence: Secondary | ICD-10-CM | POA: Diagnosis not present

## 2023-07-26 DIAGNOSIS — Z510 Encounter for antineoplastic radiation therapy: Secondary | ICD-10-CM | POA: Diagnosis not present

## 2023-07-26 DIAGNOSIS — R1310 Dysphagia, unspecified: Secondary | ICD-10-CM | POA: Diagnosis not present

## 2023-07-26 DIAGNOSIS — M546 Pain in thoracic spine: Secondary | ICD-10-CM | POA: Diagnosis not present

## 2023-07-26 DIAGNOSIS — Z79891 Long term (current) use of opiate analgesic: Secondary | ICD-10-CM | POA: Diagnosis not present

## 2023-07-26 DIAGNOSIS — C090 Malignant neoplasm of tonsillar fossa: Secondary | ICD-10-CM | POA: Diagnosis not present

## 2023-07-26 DIAGNOSIS — Z79899 Other long term (current) drug therapy: Secondary | ICD-10-CM | POA: Diagnosis not present

## 2023-07-26 DIAGNOSIS — Z923 Personal history of irradiation: Secondary | ICD-10-CM | POA: Diagnosis not present

## 2023-07-26 DIAGNOSIS — G893 Neoplasm related pain (acute) (chronic): Secondary | ICD-10-CM | POA: Diagnosis not present

## 2023-07-26 DIAGNOSIS — Z7982 Long term (current) use of aspirin: Secondary | ICD-10-CM | POA: Diagnosis not present

## 2023-07-26 LAB — RAD ONC ARIA SESSION SUMMARY
Course Elapsed Days: 14
Plan Fractions Treated to Date: 11
Plan Prescribed Dose Per Fraction: 2 Gy
Plan Total Fractions Prescribed: 35
Plan Total Prescribed Dose: 70 Gy
Reference Point Dosage Given to Date: 22 Gy
Reference Point Session Dosage Given: 2 Gy
Session Number: 11

## 2023-07-27 ENCOUNTER — Other Ambulatory Visit: Payer: Self-pay

## 2023-07-27 ENCOUNTER — Telehealth: Payer: Self-pay | Admitting: Oncology

## 2023-07-27 ENCOUNTER — Ambulatory Visit
Admission: RE | Admit: 2023-07-27 | Discharge: 2023-07-27 | Disposition: A | Discharge status: Home / Self Care | Source: Ambulatory Visit | Attending: Radiation Oncology

## 2023-07-27 DIAGNOSIS — Z79891 Long term (current) use of opiate analgesic: Secondary | ICD-10-CM | POA: Diagnosis not present

## 2023-07-27 DIAGNOSIS — Z931 Gastrostomy status: Secondary | ICD-10-CM | POA: Diagnosis not present

## 2023-07-27 DIAGNOSIS — Z79899 Other long term (current) drug therapy: Secondary | ICD-10-CM | POA: Diagnosis not present

## 2023-07-27 DIAGNOSIS — Z923 Personal history of irradiation: Secondary | ICD-10-CM | POA: Diagnosis not present

## 2023-07-27 DIAGNOSIS — Z510 Encounter for antineoplastic radiation therapy: Secondary | ICD-10-CM | POA: Diagnosis not present

## 2023-07-27 DIAGNOSIS — R1310 Dysphagia, unspecified: Secondary | ICD-10-CM | POA: Diagnosis not present

## 2023-07-27 DIAGNOSIS — Z7982 Long term (current) use of aspirin: Secondary | ICD-10-CM | POA: Diagnosis not present

## 2023-07-27 DIAGNOSIS — Z5111 Encounter for antineoplastic chemotherapy: Secondary | ICD-10-CM | POA: Diagnosis not present

## 2023-07-27 DIAGNOSIS — C090 Malignant neoplasm of tonsillar fossa: Secondary | ICD-10-CM | POA: Diagnosis not present

## 2023-07-27 DIAGNOSIS — Z87891 Personal history of nicotine dependence: Secondary | ICD-10-CM | POA: Diagnosis not present

## 2023-07-27 DIAGNOSIS — G893 Neoplasm related pain (acute) (chronic): Secondary | ICD-10-CM | POA: Diagnosis not present

## 2023-07-27 DIAGNOSIS — M546 Pain in thoracic spine: Secondary | ICD-10-CM | POA: Diagnosis not present

## 2023-07-27 DIAGNOSIS — Z95828 Presence of other vascular implants and grafts: Secondary | ICD-10-CM | POA: Diagnosis not present

## 2023-07-27 LAB — RAD ONC ARIA SESSION SUMMARY
Course Elapsed Days: 15
Plan Fractions Treated to Date: 12
Plan Prescribed Dose Per Fraction: 2 Gy
Plan Total Fractions Prescribed: 35
Plan Total Prescribed Dose: 70 Gy
Reference Point Dosage Given to Date: 24 Gy
Reference Point Session Dosage Given: 2 Gy
Session Number: 12

## 2023-07-27 NOTE — Telephone Encounter (Signed)
 Informed Vernona Rieger that [name] will see Karena Addison on 4/17.

## 2023-07-28 ENCOUNTER — Other Ambulatory Visit: Payer: Self-pay

## 2023-07-28 ENCOUNTER — Ambulatory Visit
Admission: RE | Admit: 2023-07-28 | Discharge: 2023-07-28 | Disposition: A | Discharge status: Home / Self Care | Source: Ambulatory Visit | Attending: Radiation Oncology

## 2023-07-28 DIAGNOSIS — R1310 Dysphagia, unspecified: Secondary | ICD-10-CM | POA: Diagnosis not present

## 2023-07-28 DIAGNOSIS — Z923 Personal history of irradiation: Secondary | ICD-10-CM | POA: Diagnosis not present

## 2023-07-28 DIAGNOSIS — Z79899 Other long term (current) drug therapy: Secondary | ICD-10-CM | POA: Diagnosis not present

## 2023-07-28 DIAGNOSIS — Z5111 Encounter for antineoplastic chemotherapy: Secondary | ICD-10-CM | POA: Diagnosis not present

## 2023-07-28 DIAGNOSIS — G893 Neoplasm related pain (acute) (chronic): Secondary | ICD-10-CM | POA: Diagnosis not present

## 2023-07-28 DIAGNOSIS — M546 Pain in thoracic spine: Secondary | ICD-10-CM | POA: Diagnosis not present

## 2023-07-28 DIAGNOSIS — C090 Malignant neoplasm of tonsillar fossa: Secondary | ICD-10-CM | POA: Diagnosis not present

## 2023-07-28 DIAGNOSIS — Z7982 Long term (current) use of aspirin: Secondary | ICD-10-CM | POA: Diagnosis not present

## 2023-07-28 DIAGNOSIS — Z79891 Long term (current) use of opiate analgesic: Secondary | ICD-10-CM | POA: Diagnosis not present

## 2023-07-28 DIAGNOSIS — Z95828 Presence of other vascular implants and grafts: Secondary | ICD-10-CM | POA: Diagnosis not present

## 2023-07-28 DIAGNOSIS — Z87891 Personal history of nicotine dependence: Secondary | ICD-10-CM | POA: Diagnosis not present

## 2023-07-28 DIAGNOSIS — Z931 Gastrostomy status: Secondary | ICD-10-CM | POA: Diagnosis not present

## 2023-07-28 DIAGNOSIS — Z510 Encounter for antineoplastic radiation therapy: Secondary | ICD-10-CM | POA: Diagnosis not present

## 2023-07-28 LAB — RAD ONC ARIA SESSION SUMMARY
Course Elapsed Days: 16
Plan Fractions Treated to Date: 13
Plan Prescribed Dose Per Fraction: 2 Gy
Plan Total Fractions Prescribed: 35
Plan Total Prescribed Dose: 70 Gy
Reference Point Dosage Given to Date: 26 Gy
Reference Point Session Dosage Given: 2 Gy
Session Number: 13

## 2023-07-29 ENCOUNTER — Other Ambulatory Visit: Payer: Self-pay

## 2023-07-29 ENCOUNTER — Ambulatory Visit
Admission: RE | Admit: 2023-07-29 | Discharge: 2023-07-29 | Disposition: A | Discharge status: Home / Self Care | Source: Ambulatory Visit | Attending: Radiation Oncology | Admitting: Radiation Oncology

## 2023-07-29 DIAGNOSIS — Z87891 Personal history of nicotine dependence: Secondary | ICD-10-CM | POA: Diagnosis not present

## 2023-07-29 DIAGNOSIS — C090 Malignant neoplasm of tonsillar fossa: Secondary | ICD-10-CM | POA: Diagnosis not present

## 2023-07-29 DIAGNOSIS — Z923 Personal history of irradiation: Secondary | ICD-10-CM | POA: Diagnosis not present

## 2023-07-29 DIAGNOSIS — R1310 Dysphagia, unspecified: Secondary | ICD-10-CM | POA: Diagnosis not present

## 2023-07-29 DIAGNOSIS — Z95828 Presence of other vascular implants and grafts: Secondary | ICD-10-CM | POA: Diagnosis not present

## 2023-07-29 DIAGNOSIS — Z5111 Encounter for antineoplastic chemotherapy: Secondary | ICD-10-CM | POA: Diagnosis not present

## 2023-07-29 DIAGNOSIS — Z79891 Long term (current) use of opiate analgesic: Secondary | ICD-10-CM | POA: Diagnosis not present

## 2023-07-29 DIAGNOSIS — M546 Pain in thoracic spine: Secondary | ICD-10-CM | POA: Diagnosis not present

## 2023-07-29 DIAGNOSIS — Z931 Gastrostomy status: Secondary | ICD-10-CM | POA: Diagnosis not present

## 2023-07-29 DIAGNOSIS — Z7982 Long term (current) use of aspirin: Secondary | ICD-10-CM | POA: Diagnosis not present

## 2023-07-29 DIAGNOSIS — Z79899 Other long term (current) drug therapy: Secondary | ICD-10-CM | POA: Diagnosis not present

## 2023-07-29 DIAGNOSIS — G893 Neoplasm related pain (acute) (chronic): Secondary | ICD-10-CM | POA: Diagnosis not present

## 2023-07-29 DIAGNOSIS — Z510 Encounter for antineoplastic radiation therapy: Secondary | ICD-10-CM | POA: Diagnosis not present

## 2023-07-29 LAB — RAD ONC ARIA SESSION SUMMARY
Course Elapsed Days: 17
Plan Fractions Treated to Date: 14
Plan Prescribed Dose Per Fraction: 2 Gy
Plan Total Fractions Prescribed: 35
Plan Total Prescribed Dose: 70 Gy
Reference Point Dosage Given to Date: 28 Gy
Reference Point Session Dosage Given: 2 Gy
Session Number: 14

## 2023-07-29 MED FILL — Fosaprepitant Dimeglumine For IV Infusion 150 MG (Base Eq): INTRAVENOUS | Qty: 5 | Status: AC

## 2023-07-30 ENCOUNTER — Inpatient Hospital Stay: Admitting: Dietician

## 2023-07-30 ENCOUNTER — Inpatient Hospital Stay

## 2023-07-30 ENCOUNTER — Other Ambulatory Visit: Payer: Self-pay | Admitting: Family Medicine

## 2023-07-30 ENCOUNTER — Inpatient Hospital Stay (HOSPITAL_BASED_OUTPATIENT_CLINIC_OR_DEPARTMENT_OTHER): Admitting: Oncology

## 2023-07-30 ENCOUNTER — Encounter: Payer: Self-pay | Admitting: Oncology

## 2023-07-30 ENCOUNTER — Telehealth: Payer: Self-pay | Admitting: Oncology

## 2023-07-30 ENCOUNTER — Other Ambulatory Visit: Payer: Self-pay

## 2023-07-30 ENCOUNTER — Ambulatory Visit
Admission: RE | Admit: 2023-07-30 | Discharge: 2023-07-30 | Disposition: A | Discharge status: Home / Self Care | Source: Ambulatory Visit | Attending: Radiation Oncology | Admitting: Radiation Oncology

## 2023-07-30 VITALS — BP 114/75 | HR 88 | Temp 98.0°F | Resp 16 | Ht 73.0 in | Wt 269.1 lb

## 2023-07-30 DIAGNOSIS — K1231 Oral mucositis (ulcerative) due to antineoplastic therapy: Secondary | ICD-10-CM | POA: Diagnosis not present

## 2023-07-30 DIAGNOSIS — Z79891 Long term (current) use of opiate analgesic: Secondary | ICD-10-CM | POA: Diagnosis not present

## 2023-07-30 DIAGNOSIS — Z87891 Personal history of nicotine dependence: Secondary | ICD-10-CM | POA: Diagnosis not present

## 2023-07-30 DIAGNOSIS — M546 Pain in thoracic spine: Secondary | ICD-10-CM | POA: Diagnosis not present

## 2023-07-30 DIAGNOSIS — K5900 Constipation, unspecified: Secondary | ICD-10-CM | POA: Diagnosis not present

## 2023-07-30 DIAGNOSIS — G893 Neoplasm related pain (acute) (chronic): Secondary | ICD-10-CM | POA: Diagnosis not present

## 2023-07-30 DIAGNOSIS — E785 Hyperlipidemia, unspecified: Secondary | ICD-10-CM

## 2023-07-30 DIAGNOSIS — Z510 Encounter for antineoplastic radiation therapy: Secondary | ICD-10-CM | POA: Diagnosis not present

## 2023-07-30 DIAGNOSIS — Z95828 Presence of other vascular implants and grafts: Secondary | ICD-10-CM | POA: Diagnosis not present

## 2023-07-30 DIAGNOSIS — C090 Malignant neoplasm of tonsillar fossa: Secondary | ICD-10-CM | POA: Diagnosis not present

## 2023-07-30 DIAGNOSIS — R300 Dysuria: Secondary | ICD-10-CM | POA: Diagnosis not present

## 2023-07-30 DIAGNOSIS — R1310 Dysphagia, unspecified: Secondary | ICD-10-CM | POA: Diagnosis not present

## 2023-07-30 DIAGNOSIS — T451X5A Adverse effect of antineoplastic and immunosuppressive drugs, initial encounter: Secondary | ICD-10-CM | POA: Diagnosis not present

## 2023-07-30 DIAGNOSIS — R5383 Other fatigue: Secondary | ICD-10-CM | POA: Diagnosis not present

## 2023-07-30 DIAGNOSIS — Z5111 Encounter for antineoplastic chemotherapy: Secondary | ICD-10-CM | POA: Diagnosis not present

## 2023-07-30 DIAGNOSIS — Z79899 Other long term (current) drug therapy: Secondary | ICD-10-CM | POA: Diagnosis not present

## 2023-07-30 DIAGNOSIS — Z7982 Long term (current) use of aspirin: Secondary | ICD-10-CM | POA: Diagnosis not present

## 2023-07-30 DIAGNOSIS — D701 Agranulocytosis secondary to cancer chemotherapy: Secondary | ICD-10-CM | POA: Insufficient documentation

## 2023-07-30 DIAGNOSIS — Z931 Gastrostomy status: Secondary | ICD-10-CM | POA: Diagnosis not present

## 2023-07-30 DIAGNOSIS — Z923 Personal history of irradiation: Secondary | ICD-10-CM | POA: Diagnosis not present

## 2023-07-30 LAB — CBC WITH DIFFERENTIAL (CANCER CENTER ONLY)
Abs Immature Granulocytes: 0.07 10*3/uL (ref 0.00–0.07)
Basophils Absolute: 0 10*3/uL (ref 0.0–0.1)
Basophils Relative: 1 %
Eosinophils Absolute: 0.1 10*3/uL (ref 0.0–0.5)
Eosinophils Relative: 3 %
HCT: 37.5 % — ABNORMAL LOW (ref 39.0–52.0)
Hemoglobin: 12.9 g/dL — ABNORMAL LOW (ref 13.0–17.0)
Immature Granulocytes: 2 %
Lymphocytes Relative: 15 %
Lymphs Abs: 0.5 10*3/uL — ABNORMAL LOW (ref 0.7–4.0)
MCH: 31 pg (ref 26.0–34.0)
MCHC: 34.4 g/dL (ref 30.0–36.0)
MCV: 90.1 fL (ref 80.0–100.0)
Monocytes Absolute: 0.4 10*3/uL (ref 0.1–1.0)
Monocytes Relative: 12 %
Neutro Abs: 2.1 10*3/uL (ref 1.7–7.7)
Neutrophils Relative %: 67 %
Platelet Count: 180 10*3/uL (ref 150–400)
RBC: 4.16 MIL/uL — ABNORMAL LOW (ref 4.22–5.81)
RDW: 13.5 % (ref 11.5–15.5)
WBC Count: 3.1 10*3/uL — ABNORMAL LOW (ref 4.0–10.5)
nRBC: 0 % (ref 0.0–0.2)

## 2023-07-30 LAB — BASIC METABOLIC PANEL - CANCER CENTER ONLY
Anion gap: 6 (ref 5–15)
BUN: 23 mg/dL (ref 8–23)
CO2: 30 mmol/L (ref 22–32)
Calcium: 8.5 mg/dL — ABNORMAL LOW (ref 8.9–10.3)
Chloride: 98 mmol/L (ref 98–111)
Creatinine: 0.66 mg/dL (ref 0.61–1.24)
GFR, Estimated: >60 mL/min (ref >60)
Glucose, Bld: 124 mg/dL — ABNORMAL HIGH (ref 70–99)
Potassium: 4 mmol/L (ref 3.5–5.1)
Sodium: 134 mmol/L — ABNORMAL LOW (ref 135–145)

## 2023-07-30 LAB — MAGNESIUM: Magnesium: 1.9 mg/dL (ref 1.7–2.4)

## 2023-07-30 LAB — RAD ONC ARIA SESSION SUMMARY
Course Elapsed Days: 18
Plan Fractions Treated to Date: 15
Plan Prescribed Dose Per Fraction: 2 Gy
Plan Total Fractions Prescribed: 35
Plan Total Prescribed Dose: 70 Gy
Reference Point Dosage Given to Date: 30 Gy
Reference Point Session Dosage Given: 2 Gy
Session Number: 15

## 2023-07-30 MED ORDER — HEPARIN SOD (PORK) LOCK FLUSH 100 UNIT/ML IV SOLN
500.0000 [IU] | Freq: Once | INTRAVENOUS | Status: AC | PRN
  Administered 2023-07-30: 500 [IU]

## 2023-07-30 MED ORDER — SODIUM CHLORIDE 0.9% FLUSH
10.0000 mL | Freq: Once | INTRAVENOUS | Status: AC
  Administered 2023-07-30: 10 mL

## 2023-07-30 MED ORDER — DEXAMETHASONE SODIUM PHOSPHATE 10 MG/ML IJ SOLN
10.0000 mg | Freq: Once | INTRAMUSCULAR | Status: AC
Start: 2023-07-30 — End: 2023-07-30
  Administered 2023-07-30: 10 mg via INTRAVENOUS
  Filled 2023-07-30: qty 1

## 2023-07-30 MED ORDER — SODIUM CHLORIDE 0.9% FLUSH
10.0000 mL | INTRAVENOUS | Status: DC | PRN
Start: 2023-07-30 — End: 2023-07-30
  Administered 2023-07-30: 10 mL

## 2023-07-30 MED ORDER — SODIUM CHLORIDE 0.9 % IV SOLN
INTRAVENOUS | Status: DC
Start: 2023-07-30 — End: 2023-07-30

## 2023-07-30 MED ORDER — MAGNESIUM SULFATE 2 GM/50ML IV SOLN
2.0000 g | Freq: Once | INTRAVENOUS | Status: AC
  Administered 2023-07-30: 2 g via INTRAVENOUS
  Filled 2023-07-30: qty 50

## 2023-07-30 MED ORDER — POTASSIUM CHLORIDE IN NACL 20-0.9 MEQ/L-% IV SOLN
Freq: Once | INTRAVENOUS | Status: AC
  Filled 2023-07-30: qty 1000

## 2023-07-30 MED ORDER — PALONOSETRON HCL INJECTION 0.25 MG/5ML
0.2500 mg | Freq: Once | INTRAVENOUS | Status: AC
  Administered 2023-07-30: 0.25 mg via INTRAVENOUS
  Filled 2023-07-30: qty 5

## 2023-07-30 MED ORDER — SODIUM CHLORIDE 0.9 % IV SOLN
30.0000 mg/m2 | Freq: Once | INTRAVENOUS | Status: AC
  Administered 2023-07-30: 77 mg via INTRAVENOUS
  Filled 2023-07-30: qty 77

## 2023-07-30 MED ORDER — SODIUM CHLORIDE 0.9 % IV SOLN
150.0000 mg | Freq: Once | INTRAVENOUS | Status: AC
  Administered 2023-07-30: 150 mg via INTRAVENOUS
  Filled 2023-07-30: qty 150

## 2023-07-30 NOTE — Progress Notes (Signed)
 Patient seen by Dr. Archie Patten Pasam today  Vitals are within treatment parameters:Yes   Labs are within treatment parameters: No (Please specify and give further instructions.)   WBC Ct: 3.1  Treatment plan has been signed: Yes   Per physician team, Patient is ready for treatment. Please note the following modifications:  "He is good to proceed with Tx today. I dose reduced cisplatin to 30 mg/m2 because of slightly low white count."   "IV fluids added to Monday's and Wednesday's of each week moving forward."

## 2023-07-30 NOTE — Progress Notes (Signed)
 [facility]  ONCOLOGY CLINIC PROGRESS NOTE   Patient Care Team: [name], [name], MD as PCP - General (Family Medicine) Cherlyn Cushing, RN as Oncology Nurse Navigator Ernesto Rutherford, MD as Referring Physician (Ophthalmology) Marcine Matar, MD as Consulting Physician (Urology) Margaretmary Dys, MD as Consulting Physician (Radiation Oncology) Maryclare Labrador, RN as Registered Nurse Axel Filler, Larna Daughters, NP as Nurse Practitioner (Hematology and Oncology) Lonie Peak, MD as Attending Physician (Radiation Oncology) Ashok Croon, MD as Consulting Physician (Otolaryngology) Malmfelt, Lise Auer, RN as Oncology Nurse Navigator Meryl Crutch, MD as Consulting Physician (Oncology)  PATIENT NAME: Isaac Carpenter   MR#: [id number] DOB: Dec 16, 1950  Date of visit: 07/30/2023   ASSESSMENT & PLAN:   Isaac Carpenter is a 73 y.o. gentleman with a past medical history of prostate cancer diagnosed in March 2024, S/P radioactive seed implant/brachytherapy, hypertension, dyslipidemia, hypothyroidism, obstructive sleep apnea. He presented for follow up of recently diagnosed squamous cell carcinoma of the right tonsil, clinical stage II disease (cT3,cN2,cM0,p16+).   Malignant neoplasm of tonsillar fossa (HCC) Please review oncology history for additional details and timeline of events.  Symptoms include severe pain and dysphagia since mid-November, progressively worsening.   cT3,cN2,cM0,p16+ tumor, Stage II disease.   His case was discussed in tumor conference on 06/09/2023.  Given the extent of disease, consensus opinion is to proceed with concurrent chemoradiation. We have discussed about role of cisplatin being a radiosensitizer in the treatment of head and neck cancer.  We have discussed about the curative intent of chemoradiation for this patient.  Patient was willing to proceed with weekly cisplatin after discussing risk versus benefits and side effect profile.  He started  cycle 1 of cisplatin at a dose of 40 mg/m on 07/09/2023.  Plan was to continue cisplatin weekly during the course of radiation.  He began radiation treatments from 07/12/2023.  Last week chemotherapy had to be held because of severe fatigue with declined performance status of 3.  This week he is doing much better and he is in good spirits.  CBCD is entirely unremarkable today.  Creatinine stable at 0.75.  Will resume chemotherapy and proceed with cycle 3 of cisplatin today at 40 mg/m.  - Ensure home hydration (80-90 oz non-caffeinated fluids daily).  Patient's wife was encouraged to start using feeding tube as suggested by the nutritionist and also to use feeding tube for hydration.  - Monitor and replace electrolytes as needed  RTC in 1 week for labs, follow-up and continuation of chemotherapy.  Leukopenia due to antineoplastic chemotherapy (HCC) White count slowly declining secondary to chemotherapy and it is 3100 today.  ANC 2100.  We will dose reduce cisplatin to 30 mg/m this week.  We will hold chemotherapy if ANC is below 1000.  Growth factor support will be considered.  Chemotherapy-induced fatigue He is experiencing significant fatigue, primarily attributed to the chemotherapy regimen, compounded by dehydration and weight loss. Reducing chemotherapy dosage should prevent further drop in white blood cell count and help alleviate fatigue. - Reduce chemotherapy dosage to prevent further drop in white blood cell count - Ensure adequate hydration with additional IV fluids on treatment days - Monitor fatigue levels and adjust treatment as necessary   Oral mucositis He reports a burning sensation in the mouth and tongue, consistent with oral mucositis, a common chemotherapy side effect. Lidocaine and magic mouthwash provide symptomatic relief. Oxycodone is ineffective for this type of pain. - Continue using lidocaine and magic mouthwash for oral pain management -  Encourage  regular rinsing with salt water and baking soda  Constipation He is experiencing constipation, likely due to reduced dietary fiber intake and medication side effects. Previous use of Miralax and Colace provided minimal relief. Increasing Miralax dosage and introducing fiber supplements are recommended. - Increase Miralax dosage to twice daily - Introduce fiber supplements such as Benefiber or Metamucil, ensuring proper mixing for tube feeding  Hypertension His blood pressure is well-managed, with a recent reading of 135/74. Due to weight loss and potential dehydration, blood pressure medication has been held. The weight loss may contribute to the well-managed blood pressure. - Continue to hold blood pressure medication if blood pressure remains well-managed - Monitor blood pressure regularly  Urinary burning He experienced burning during urination, which resolved by the morning. This may be related to dehydration or a transient issue. Adequate hydration is advised to prevent recurrence. - Ensure adequate hydration to prevent recurrence of urinary symptoms  Follow-up He will continue with the current treatment plan, with adjustments as needed based on weekly assessments. He will see Karena Addison Tile next week, who is informed of the current treatment plan. Radiation is the mainstay of treatment and should continue without breaks, while chemotherapy will be adjusted as needed. - Arrange follow-up appointment with Asencion Islam next week - Monitor treatment response and adjust as necessary - Maintain communication regarding any changes in symptoms or treatment needs   I reviewed lab results and outside records for this visit and discussed relevant results with the patient. Diagnosis, plan of care and treatment options were also discussed in detail with the patient. Opportunity provided to ask questions and answers provided to his apparent satisfaction. Provided instructions to call our clinic with any  problems, questions or concerns prior to return visit. I recommended to continue follow-up with PCP and sub-specialists. He verbalized understanding and agreed with the plan.   NCCN guidelines have been consulted in the planning of this patient's care.  I spent a total of 40 minutes during this encounter with the patient including review of chart and various tests results, discussions about plan of care and coordination of care plan.   Meryl Crutch, MD  07/30/2023 4:35 PM  [facility] - [facility] [address] [place] [address] Dept: [phone] Dept Fax: [phone]    CHIEF COMPLAINT/ REASON FOR VISIT:   Follow-up for squamous cell carcinoma of the right tonsil, stage II disease (cT3,cN2,cM0,p16+)  Current Treatment: Concurrent chemoradiation with weekly cisplatin started from 07/09/2023.  INTERVAL HISTORY:    Discussed the use of AI scribe software for clinical note transcription with the patient, who gave verbal consent to proceed.   Isaac Carpenter is here today for repeat clinical assessment.   He started concurrent chemoradiation with weekly cisplatin on 07/09/2023.  History of Present Illness  He is experiencing significant fatigue, which he attributes to his ongoing chemotherapy treatment. He feels tired and is unable to consume much by mouth, managing only to take his medication with difficulty using Jell-O. Crushing medications is being considered to aid swallowing.  He has difficulty swallowing, unable to swallow water easily. A burning sensation in his mouth and tongue is intolerable. He uses a mouth rinse with soda and salt water frequently. Lidocaine and a 'magic bath wash' provide more relief than oxycodone, which does not alleviate his symptoms.  He is experiencing constipation, for which he takes Miralax and Colace, reporting slight improvement.  He has not been consuming much fiber  currently.  A burning sensation during urination occurred last night but resolved by morning. Hydration is maintained, important for kidney function, as electrolytes and kidney function are stable.  He has not been taking his blood pressure medication regularly this week, as his blood pressure has remained low, with the highest reading being 135/74. Weight loss may be contributing to low blood pressure and fatigue.    I have reviewed the past medical history, past surgical history, social history and family history with the patient and they are unchanged from previous note.  HISTORY OF PRESENT ILLNESS:   ONCOLOGY HISTORY:   He presented to his PCP Dr Betty [name] on 05-17-23 with complains of right-sided jaw/maxillary pain that tends to occur when coughing. Pain had persisted for several months at that time following an upper respiratory infection.    Subsequently, he underwent a CT soft tissue neck on 05-17-23 which revealed an ulcerated enhancing mass involving the superior oropharynx and soft palate/uvula measuring 3.4 x 3.0 x 3.6 cm in the greatest extent that is concerning for malignancy. No evidence of cervical lymphadenopathy was indicated on scan    In light of findings, the patient saw Dr. Ashok Croon on 05-27-23 with worsening symptoms. During his visit, he underwent a flexible fiberoptic laryngoscopy with several biopsies of the friable tissue from the right oropharyngeal mass.     Biopsy of right oropharynx mass on 05-27-23 revealed: Invasive moderately differentiated squamous cell carcinoma. Immunohistochemistry for p16 shows diffuse strong positivity.    During most recent visit with Dr. Irene Pap on 06-04-23, patient reported constant worsening symptoms of odynophagia and dysphagia. Stating that he's unable to maintain adequate nutrition as food typically consists of few bites of soft food and liquids. Pain does not seem to be managed by pain relievers including oxycodone. To further  evaluate the extent of the disease, he underwent a PET scan performed on 06-07-23 revealing a right  oropharyngeal tumor >4cm, likely b/l level II adenopathy, no distant metastases.    cT3,cN2,cM0,p16+ tumor, Stage II disease.    Plan made for concurrent chemoradiation with weekly cisplatin. Started Cisplatin from 07/09/2023 and radiation from 07/12/23.   Oncology History  Malignant neoplasm of prostate (HCC)  05/13/2022 Cancer Staging   Staging form: Prostate, AJCC 8th Edition - Clinical stage from 05/13/2022: Stage IIC (cT1c, cN0, cM0, PSA: 13.2, Grade Group: 3) - Signed by Marcello Fennel, PA-C on 07/17/2022 Histopathologic type: Adenocarcinoma, NOS Stage prefix: Initial diagnosis Prostate specific antigen (PSA) range: 10 to 19 Gleason primary pattern: 4 Gleason secondary pattern: 3 Gleason score: 7 Histologic grading system: 5 grade system Number of biopsy cores examined: 12 Number of biopsy cores positive: 4 Location of positive needle core biopsies: Both sides   07/17/2022 Initial Diagnosis   Malignant neoplasm of prostate (HCC)   Malignant neoplasm of tonsillar fossa (HCC)  06/08/2023 Initial Diagnosis   Malignant neoplasm of tonsillar fossa (HCC)   06/08/2023 Cancer Staging   Staging form: Pharynx - HPV-Mediated Oropharynx, AJCC 8th Edition - Clinical stage from 06/08/2023: Stage II (cT3, cN2, cM0, p16+) - Signed by Lonie Peak, MD on 06/08/2023 Stage prefix: Initial diagnosis   07/09/2023 -  Chemotherapy   Patient is on Treatment Plan : HEAD/NECK Cisplatin (40) q7d         REVIEW OF SYSTEMS:   Review of Systems - Oncology  All other pertinent systems were reviewed with the patient and are negative.  ALLERGIES: He is allergic to testosterone.  MEDICATIONS:  Current Outpatient Medications  Medication Sig Dispense Refill   ALPRAZolam (XANAX) 1 MG tablet Take 0.5 tablets (0.5 mg total) by mouth 2 (two) times daily as needed for anxiety. 60 tablet 0   aspirin EC 81 MG  tablet Take 81 mg by mouth daily. Swallow whole.     atorvastatin (LIPITOR) 20 MG tablet Take 1 tablet (20 mg total) by mouth daily. 90 tablet 3   dexamethasone (DECADRON) 4 MG tablet Take 2 tablets (8 mg) by mouth daily x 3 days starting the day after cisplatin chemotherapy. Take with food. 30 tablet 1   levothyroxine (SYNTHROID) 88 MCG tablet TAKE ONE TABLET BY MOUTH DAILY. 90 tablet 2   lidocaine (XYLOCAINE) 2 % solution Use as directed 15 mLs in the mouth or throat every 4 (four) hours as needed for mouth pain. 100 mL 1   lidocaine-prilocaine (EMLA) cream Apply to affected area once 30 g 3   lisinopril-hydrochlorothiazide (ZESTORETIC) 20-25 MG tablet Take 1 tablet by mouth once daily. 90 tablet 2   Nutritional Supplements (NUTREN 1.5) LIQD 2 cartons Nutren 1.5 (500 ml) QID via tube. Flush with 60 ml water before/after each bolus. Provide additional 250 ml water flush 4x/day in between feedings to meet hydration needs. Provides 3000 kcal, 136 g protein, 1528 ml free water (3008 ml total water) 2000 ml/day meets 100% DRI     solifenacin (VESICARE) 5 MG tablet Take 5 mg by mouth daily.     tamsulosin (FLOMAX) 0.4 MG CAPS capsule Take 0.4 mg by mouth daily after supper.     acetaminophen (TYLENOL) 500 MG tablet Take 1 tablet (500 mg total) by mouth every 6 (six) hours. Please take every 6 hrs and stagger with Motrin 3 hrs apart from each other. Please do not exceed maximum daily dose to avoid liver damage (Patient not taking: Reported on 07/30/2023) 30 tablet 1   baclofen (LIORESAL) 10 MG tablet Take 0.5-1 tablets (5-10 mg total) by mouth at bedtime as needed for muscle spasms. (Patient not taking: Reported on 07/30/2023) 10 each 0   doxazosin (CARDURA) 1 MG tablet Take 1 tablet (1 mg total) by mouth at bedtime. (Patient not taking: Reported on 07/30/2023) 30 tablet 1   ibuprofen (ADVIL) 600 MG tablet Take 1 tablet (600 mg total) by mouth every 6 (six) hours. Please take every 6 hrs, and stagger this  medication 3 hrs apart from Tylenol (Patient not taking: Reported on 07/30/2023) 30 tablet 0   magic mouthwash SOLN TAKE BY MOUTH THREE TIMES DAILY AS NEEDED FOR UP TO 10 DAYS FOR MOUTH PAIN (Patient not taking: Reported on 06/10/2023)     ondansetron (ZOFRAN) 8 MG tablet Take 1 tablet (8 mg total) by mouth every 8 (eight) hours as needed for nausea or vomiting. Start on the third day after cisplatin. (Patient not taking: Reported on 07/30/2023) 30 tablet 1   oxyCODONE 10 MG TABS Take 1 tablet (10 mg total) by mouth every 6 (six) hours as needed for severe pain (pain score 7-10). Use 1 tab Q4hrs for pain and if continue to have severe pain ok to take 2 tabs Q4hrs (Patient not taking: Reported on 07/09/2023) 90 tablet 0   prochlorperazine (COMPAZINE) 10 MG tablet Take 1 tablet (10 mg total) by mouth every 6 (six) hours as needed (Nausea or vomiting). (Patient not taking: Reported on 07/09/2023) 30 tablet 1   No current facility-administered medications for this visit.   Facility-Administered Medications Ordered in Other Visits  Medication Dose Route Frequency Provider Last Rate Last Admin   0.9 %  sodium chloride infusion   Intravenous Continuous Chaze Hruska, MD   Stopped at 07/30/23 1423   sodium chloride flush (NS) 0.9 % injection 10 mL  10 mL Intracatheter PRN Rennae Ferraiolo, MD   10 mL at 07/30/23 1501     VITALS:   Blood pressure 114/75, pulse 88, temperature 98 F (36.7 C), temperature source Temporal, resp. rate 16, height 6\' 1"  (1.854 m), weight 269 lb 1.6 oz (122.1 kg), SpO2 98%.  Wt Readings from Last 3 Encounters:  07/30/23 269 lb 1.6 oz (122.1 kg)  07/22/23 275 lb 3.2 oz (124.8 kg)  07/15/23 273 lb 1.6 oz (123.9 kg)    Body mass index is 35.5 kg/m.    Onc Performance Status - 07/30/23 0851       ECOG Perf Status   ECOG Perf Status Capable of only limited selfcare, confined to bed or chair more than 50% of waking hours      KPS SCALE   KPS % SCORE Requires occasional  assistance but is able to care for most needs               PHYSICAL EXAM:   Physical Exam Constitutional:      General: He is not in acute distress.    Comments: Appears tired.  Presented to clinic in a wheelchair today  HENT:     Head: Normocephalic and atraumatic.     Mouth/Throat:     Comments: Right oropharynx tonsil tumor eroding through soft palate, tumor reaches uvula, uvula swollen Eyes:     General: No scleral icterus.    Conjunctiva/sclera: Conjunctivae normal.  Cardiovascular:     Rate and Rhythm: Normal rate and regular rhythm.     Heart sounds: Normal heart sounds.  Pulmonary:     Effort: Pulmonary effort is normal.     Breath sounds: Normal breath sounds.  Chest:     Comments: Right sided Port-A-Cath on place without any signs of infection Abdominal:     General: There is no distension.     Comments: Feeding tube in place  Musculoskeletal:     Right lower leg: No edema.     Left lower leg: No edema.  Neurological:     General: No focal deficit present.     Mental Status: He is alert and oriented to person, place, and time.      LABORATORY DATA:   I have reviewed the data as listed.  Results for orders placed or performed in visit on 07/30/23  Magnesium  Result Value Ref Range   Magnesium 1.9 1.7 - 2.4 mg/dL  Basic Metabolic Panel - Cancer Center Only  Result Value Ref Range   Sodium 134 (L) 135 - 145 mmol/L   Potassium 4.0 3.5 - 5.1 mmol/L   Chloride 98 98 - 111 mmol/L   CO2 30 22 - 32 mmol/L   Glucose, Bld 124 (H) 70 - 99 mg/dL   BUN 23 8 - 23 mg/dL   Creatinine 4.69 6.29 - 1.24 mg/dL   Calcium 8.5 (L) 8.9 - 10.3 mg/dL   GFR, Estimated >52 >84 mL/min   Anion gap 6 5 - 15  CBC with Differential (Cancer Center Only)  Result Value Ref Range   WBC Count 3.1 (L) 4.0 - 10.5 K/uL   RBC 4.16 (L) 4.22 - 5.81 MIL/uL   Hemoglobin 12.9 (L) 13.0 - 17.0 g/dL   HCT 13.2 (L)  39.0 - 52.0 %   MCV 90.1 80.0 - 100.0 fL   MCH 31.0 26.0 - 34.0 pg    MCHC 34.4 30.0 - 36.0 g/dL   RDW 11.9 14.7 - 82.9 %   Platelet Count 180 150 - 400 K/uL   nRBC 0.0 0.0 - 0.2 %   Neutrophils Relative % 67 %   Neutro Abs 2.1 1.7 - 7.7 K/uL   Lymphocytes Relative 15 %   Lymphs Abs 0.5 (L) 0.7 - 4.0 K/uL   Monocytes Relative 12 %   Monocytes Absolute 0.4 0.1 - 1.0 K/uL   Eosinophils Relative 3 %   Eosinophils Absolute 0.1 0.0 - 0.5 K/uL   Basophils Relative 1 %   Basophils Absolute 0.0 0.0 - 0.1 K/uL   Immature Granulocytes 2 %   Abs Immature Granulocytes 0.07 0.00 - 0.07 K/uL  Results for orders placed or performed in visit on 07/30/23  Rad Onc Aria Session Summary  Result Value Ref Range   Course ID [email or web address]    Course Start Date 07/05/2023    Session Number 15    Course First Treatment Date 07/12/2023  8:47 AM    Course Last Treatment Date 07/30/2023  7:50 AM    Course Elapsed Days 18    Reference Point ID HN dp    Reference Point Dosage Given to Date 30.00000015 Gy   Reference Point Session Dosage Given 2.00000001 Gy   Plan ID [id number]    Plan Fractions Treated to Date 15    Plan Total Fractions Prescribed 35    Plan Prescribed Dose Per Fraction 2 Gy   Plan Total Prescribed Dose 70.000000 Gy   Plan Primary Reference Point HN dp     RADIOGRAPHIC STUDIES:  I have personally reviewed the radiological images as listed and agree with the findings in the report.  No results found.   CODE STATUS:  Code Status History     Date Active Date Inactive Code Status Order ID Comments User Context   06/18/2023 1400 06/19/2023 0508 Full Code [id number]  Richarda Overlie, MD HOV    Questions for Most Recent Historical Code Status (Order [id number])     Question Answer   By: Consent: discussion documented in EHR            No orders of the defined types were placed in this encounter.    Future Appointments  Date Time Provider Department Center  08/02/2023  8:30 AM [facility] LINAC 4 [facility] None  08/02/2023  8:45 AM LINAC-SQUIRE [facility] None   08/02/2023  9:45 AM [facility] 2 [facility] None  08/03/2023  8:30 AM [facility] LINAC 4 [facility] None  08/04/2023  8:30 AM [facility] LINAC 4 [facility] None  08/04/2023  9:00 AM [facility] INFUSION [facility] None  08/05/2023 10:15 AM [facility] LINAC 4 [facility] None  08/05/2023 10:45 AM [facility] FLUSH [facility] None  08/05/2023 11:20 AM Thayil, Irene T, PA-C [facility] None  08/06/2023  7:15 AM [facility] LINAC 4 [facility] None  08/06/2023  7:30 AM [facility] INFUSION [facility] None  08/06/2023  9:00 AM Noreene Larsson, RD [facility] None  08/09/2023  8:30 AM [facility] LINAC 4 [facility] None  08/09/2023  9:00 AM [facility] INFUSION [facility] None  08/10/2023  8:30 AM [facility] LINAC 4 [facility] None  08/11/2023  8:00 AM [facility] INFUSION [facility] None  08/11/2023  8:30 AM [facility] LINAC 4 [facility] None  08/12/2023  8:30 AM [facility] LINAC 4 [facility] None  08/13/2023  7:45 AM [facility] LINAC 3 [facility]  None  08/13/2023  8:15 AM [facility] FLUSH [facility] None  08/13/2023  8:45 AM Avelyn Touch, MD [facility] None  08/13/2023 10:00 AM [facility] INFUSION [facility] None  08/13/2023 10:30 AM Abdul, Arvid Right, LCSW [facility] None  08/16/2023  8:30 AM [facility] LINAC 4 [facility] None  08/16/2023  9:00 AM [facility] INFUSION [facility] None  08/17/2023  8:30 AM [facility] LINAC 4 [facility] None  08/18/2023  8:30 AM [facility] LINAC 4 [facility] None  08/18/2023  9:00 AM [facility] INFUSION [facility] None  08/19/2023  8:30 AM [facility] LINAC 4 [facility] None  08/20/2023  7:45 AM [facility] LINAC 4 [facility] None  08/20/2023  8:00 AM [facility] FLUSH [facility] None  08/20/2023  8:30 AM Nichalos Brenton, MD [facility] None  08/20/2023  9:30 AM [facility] INFUSION [facility] None  08/20/2023 10:30 AM Noreene Larsson, RD [facility] None  08/23/2023  8:30 AM [facility] LINAC 4 [facility] None  08/24/2023  8:30 AM  [facility] LINAC 4 [facility] None  08/25/2023  8:30 AM [facility] LINAC 4 [facility] None  08/26/2023  8:30 AM [facility] LINAC 4 [facility] None  08/27/2023  8:30 AM [facility] LINAC 4 [facility] None  09/16/2023  9:00 AM Breedlove Blue, Blaire L, PT [facility] None     This document was completed utilizing speech recognition software. Grammatical errors, random word insertions, pronoun errors, and incomplete sentences are an occasional consequence of this system due to software limitations, ambient noise, and hardware issues. Any formal questions or concerns about the content, text or information contained within the body of this dictation should be directly addressed to the provider for clarification.

## 2023-07-30 NOTE — Assessment & Plan Note (Addendum)
 Please review oncology history for additional details and timeline of events.  Symptoms include severe pain and dysphagia since mid-November, progressively worsening.   cT3,cN2,cM0,p16+ tumor, Stage II disease.   His case was discussed in tumor conference on 06/09/2023.  Given the extent of disease, consensus opinion is to proceed with concurrent chemoradiation. We have discussed about role of cisplatin being a radiosensitizer in the treatment of head and neck cancer.  We have discussed about the curative intent of chemoradiation for this patient.  Patient was willing to proceed with weekly cisplatin after discussing risk versus benefits and side effect profile.  He started cycle 1 of cisplatin at a dose of 40 mg/m on 07/09/2023.  Plan was to continue cisplatin weekly during the course of radiation.  He began radiation treatments from 07/12/2023.  During week 2, chemotherapy had to be held because of severe fatigue with ECOG performance status of 3.  He resumed chemotherapy last week.  Labs today show white count of 3100 with ANC of 2100.  Creatinine 0.66.  We will dose reduce cisplatin to 30 mg/m with current cycle and continue with dose reduction if persistent cytopenias are noted.  We will hold chemotherapy if ANC is below 1000.  - Ensure home hydration (80-90 oz non-caffeinated fluids daily).  Patient's wife was encouraged to start using feeding tube as suggested by the nutritionist and also to use feeding tube for hydration.  - Monitor and replace electrolytes as needed  RTC in 1 week for labs, follow-up and continuation of chemotherapy.

## 2023-07-30 NOTE — Progress Notes (Signed)
 Nutrition Follow-up:  Pt with stage II SCC of right tonsil, p16 positive. He is planning to receive concurrent chemoradiation (first chemo 3/21). First RT planned 3/24. Patient is under the care of Dr. Basilio Cairo and Dr. Arlana Pouch.   S/p Gtube 2/28 DME: [facility]  Met with patient and wife in infusion. Patient in pain and tearful at visit. Asking when he will feel better. His throat is raw. Patient is not eating/drinking orally other than spoonful of pudding with meds. Wife reports patient has been constipated this week. He has been unable to tolerate more than 5 cartons of Nutren the last few days due to this. Patient did have a BM this morning s/p colace + miralax. Patient is doing baking soda salt water rinses.    Medications: reviewed   Labs: glucose 124, Na 134  Anthropometrics: Wt 269 lb 1.6 oz today decreased 2% in the last week - this is severe  4/3 - 275 lb 3.2 oz 3/27 - 273 lb 1.6 oz    Estimated Energy Needs  Kcals: 2800-3180 Protein: 140-153 Fluid: >2.8 L  NUTRITION DIAGNOSIS: Unintended wt loss continues - addressing with TF   INTERVENTION:  Continue working to increase TF to goal Supportive therapy added to plan per MD - IVF 2x/week Recommend daily miralax Continue baking soda salt water rinses  Support and encouragement   2 cartons Nutren 1.5 (500 ml) QID. Flush with 60 ml water before and after bolus. Give 250 ml free water QID to meet hydration needs. Provides 2000 ml/day, 3000 kcal, 136 g protein, 3008 ml total water   MONITORING, EVALUATION, GOAL: wt trends, intake   NEXT VISIT: Friday April 18 during infusion

## 2023-07-30 NOTE — Assessment & Plan Note (Signed)
 White count slowly declining secondary to chemotherapy and it is 3100 today.  ANC 2100.  We will dose reduce cisplatin to 30 mg/m this week.  We will hold chemotherapy if ANC is below 1000.  Growth factor support will be considered.

## 2023-07-30 NOTE — Telephone Encounter (Signed)
 I informed Isaac Carpenter of Isaac Carpenter's upcoming appointments. Isaac Carpenter was acceptable to all appointments.

## 2023-07-30 NOTE — Patient Instructions (Signed)

## 2023-07-30 NOTE — Progress Notes (Signed)
 Per Dr. Arlana Pouch, OK to proceed with treatment with 175 mL urine output since pt arrived for treatment.

## 2023-07-30 NOTE — Progress Notes (Signed)
 Nutrition Follow-up:  Pt with stage II SCC of right tonsil, p16 positive. He is planning to receive concurrent chemoradiation (first chemo 3/21). First RT planned 3/24. Patient is under the care of Dr. Lurena Sally and Dr. Randye Buttner.   S/p Gtube 2/28 DME: [facility]   Met with patient and wife in infusion. Patient sleeping soundly at visit. Wife reports constipation has resolved s/p miralax, dulcolax, and mag citrate. Stools are now loose. Patient having lots of pain in mouth and back of throat. He continues taking fluconazole  for thrush. Patient is relying on tube for all of nutrition/hydration as well as some medications. He has not done a bolus feed this morning. Agreeable to trying boli bag with RD Felipa Horsfall).    Medications: reviewed   Labs: Na 134, glucose 124  Anthropometrics: Wt 266 lb 4/17 decreased from 269 lb 1.6 oz on 4/11  4/3 - 275 lb 3.2 oz  3/27 - 273 lb 1.6 oz   Wts down 3% in 3 weeks - significant  Estimated Energy Needs  Kcals: 2800-3180 Protein: 140-153 Fluid: >2.8 L  NUTRITION DIAGNOSIS: Unintended wt loss - ongoing    INTERVENTION:  Continue working to increase TF to goal as tolerated - trial boli bag for administering bolus Continue daily bowel regimen Pt receiving supportive IVF 2/week Continue baking soda salt water  rinses Suggested trying mouth swabs to remove dried/thick saliva from mouth - samples provided    MONITORING, EVALUATION, GOAL: wt trends, intake   NEXT VISIT: Wednesday April 23 during infusion

## 2023-08-02 ENCOUNTER — Other Ambulatory Visit: Payer: Self-pay

## 2023-08-02 ENCOUNTER — Ambulatory Visit
Admission: RE | Admit: 2023-08-02 | Discharge: 2023-08-02 | Disposition: A | Discharge status: Home / Self Care | Source: Ambulatory Visit | Attending: Radiation Oncology | Admitting: Radiation Oncology

## 2023-08-02 ENCOUNTER — Other Ambulatory Visit: Payer: Self-pay | Admitting: Radiation Oncology

## 2023-08-02 ENCOUNTER — Ambulatory Visit: Admitting: Nutrition

## 2023-08-02 ENCOUNTER — Other Ambulatory Visit: Payer: Self-pay | Admitting: Oncology

## 2023-08-02 ENCOUNTER — Inpatient Hospital Stay

## 2023-08-02 VITALS — BP 111/54 | HR 55 | Temp 97.6°F | Resp 16 | Wt 272.2 lb

## 2023-08-02 DIAGNOSIS — Z79891 Long term (current) use of opiate analgesic: Secondary | ICD-10-CM | POA: Diagnosis not present

## 2023-08-02 DIAGNOSIS — C090 Malignant neoplasm of tonsillar fossa: Secondary | ICD-10-CM | POA: Diagnosis not present

## 2023-08-02 DIAGNOSIS — G893 Neoplasm related pain (acute) (chronic): Secondary | ICD-10-CM | POA: Diagnosis not present

## 2023-08-02 DIAGNOSIS — Z510 Encounter for antineoplastic radiation therapy: Secondary | ICD-10-CM | POA: Diagnosis not present

## 2023-08-02 DIAGNOSIS — Z923 Personal history of irradiation: Secondary | ICD-10-CM | POA: Diagnosis not present

## 2023-08-02 DIAGNOSIS — Z87891 Personal history of nicotine dependence: Secondary | ICD-10-CM | POA: Diagnosis not present

## 2023-08-02 DIAGNOSIS — Z931 Gastrostomy status: Secondary | ICD-10-CM | POA: Diagnosis not present

## 2023-08-02 DIAGNOSIS — Z7982 Long term (current) use of aspirin: Secondary | ICD-10-CM | POA: Diagnosis not present

## 2023-08-02 DIAGNOSIS — Z95828 Presence of other vascular implants and grafts: Secondary | ICD-10-CM | POA: Diagnosis not present

## 2023-08-02 DIAGNOSIS — M546 Pain in thoracic spine: Secondary | ICD-10-CM | POA: Diagnosis not present

## 2023-08-02 DIAGNOSIS — R1310 Dysphagia, unspecified: Secondary | ICD-10-CM | POA: Diagnosis not present

## 2023-08-02 DIAGNOSIS — Z79899 Other long term (current) drug therapy: Secondary | ICD-10-CM | POA: Diagnosis not present

## 2023-08-02 DIAGNOSIS — Z5111 Encounter for antineoplastic chemotherapy: Secondary | ICD-10-CM | POA: Diagnosis not present

## 2023-08-02 LAB — RAD ONC ARIA SESSION SUMMARY
Course Elapsed Days: 21
Plan Fractions Treated to Date: 16
Plan Prescribed Dose Per Fraction: 2 Gy
Plan Total Fractions Prescribed: 35
Plan Total Prescribed Dose: 70 Gy
Reference Point Dosage Given to Date: 32 Gy
Reference Point Session Dosage Given: 2 Gy
Session Number: 16

## 2023-08-02 MED ORDER — METOCLOPRAMIDE HCL 10 MG PO TABS: 10.0000 mg | ORAL_TABLET | Freq: Three times a day (TID) | ORAL | 3 refills | Status: DC

## 2023-08-02 MED ORDER — PANTOPRAZOLE SODIUM 40 MG PO TBEC: 40.0000 mg | DELAYED_RELEASE_TABLET | Freq: Every day | ORAL | 3 refills | Status: DC

## 2023-08-02 MED ORDER — DEXAMETHASONE 1 MG/ML PO CONC: ORAL | 0 refills | Status: DC

## 2023-08-02 MED ORDER — SODIUM CHLORIDE 0.9 % IV SOLN: Freq: Once | INTRAVENOUS | Status: AC

## 2023-08-02 MED ORDER — SODIUM CHLORIDE 0.9% FLUSH
10.0000 mL | Freq: Once | INTRAVENOUS | Status: AC
  Administered 2023-08-02: 10 mL via INTRAVENOUS

## 2023-08-02 MED ORDER — FLUCONAZOLE 40 MG/ML PO SUSR: ORAL | 0 refills | Status: DC

## 2023-08-02 MED ORDER — HEPARIN SOD (PORK) LOCK FLUSH 100 UNIT/ML IV SOLN
500.0000 [IU] | Freq: Once | INTRAVENOUS | Status: AC
  Administered 2023-08-02: 500 [IU] via INTRAVENOUS

## 2023-08-02 NOTE — Patient Instructions (Signed)

## 2023-08-02 NOTE — Progress Notes (Addendum)
 Asked to see Isaac Carpenter due to tube feeding intolerance.  Isaac Carpenter reports feeling too full with tube feeding moving into his throat.  Nutrition follow-up completed with Isaac Carpenter and wife in symptom management clinic.  Isaac Carpenter is receiving fluids.  He was diagnosed with stage II SCC of the right tonsil, p16 positive.  He is receiving concurrent chemoradiation therapy under the care of Dr. Lurena Sally and Dr. Randye Buttner.  Isaac Carpenter reports ongoing constipation.  Reports last bowel movement was 2 days ago.  He has only been having a bowel movement every 2 to 3 days.  He has been taking MiraLAX once a day but has not added fiber or increased MiraLAX dosage yet.  He has taken Colace.  Isaac Carpenter is not taking much by mouth.  Wife thinks oral medications are getting stuck in his throat.  He was tolerating oral medications with Jell-O or pudding but is not able to swallow either now.  Reports early satiety.  Has only tolerated 5-6 cartons of tube feeding per day, usually over 4 feedings.  Wife reports feedings are taking well over 1 hour.  Wife requesting to give Isaac Carpenter a carton of tube feeding during IV fluids.  Provided 1 carton of Osmolite 1.5, bottled water , and syringe.  Observed tube feeding administration.  Isaac Carpenter's tube is very slow and appears partially clogged.  Assisted Isaac Carpenter's wife with warm water  flushes to unclog feeding tube.  This appeared to help some but tube still runs very slow.  MD to add Reglan  and Protonix  today.  G-tube placement on February 28 DME: [facility]  Weight documented as 272 pounds 3.2 ounces April 14. Isaac Carpenter weighed 275 pounds 3.2 ounces April 3.  Labs include sodium 134, glucose 124 magnesium  1.9.  Estimated nutrition needs: 2800-3180 cal, 140-153 g protein, greater than 2.8 L fluid.  Nutrition diagnosis: Unintended weight loss, stable.  Intervention: Follow bowel regimen to ensure Isaac Carpenter is no longer constipated. Continue baking soda and salt water  gargles for thickened  saliva. Encouraged medications as prescribed by MD.  Recommend resuming Nutren 1.5,1 carton 4 times daily.   Use 60 mL of free water  before and after bolus feeding. If tolerated, increase to 1-1/2 cartons 4 times a day.  Give additional 250 mL of free water  4 times daily between tube feeding.  This provides 3000 cal, 136 g protein, 3008 mL free water .  Monitoring, evaluation, goals: Isaac Carpenter will tolerate adequate calories and protein to minimize weight loss throughout treatment.   Next visit: Tomorrow, April 15, after radiation therapy.    **Disclaimer: This note was dictated with voice recognition software. Similar sounding words can inadvertently be transcribed and this note may contain transcription errors which may not have been corrected upon publication of note.**

## 2023-08-02 NOTE — Progress Notes (Signed)
 RN called in by dietician d/t pt bleeding. Upon assessment pt has approx 2 inch skin tear on his right posterior forearm. Pt reported rushing to the bathroom and "bumping into the wall" when he noticed his arm began to bleed. At the time of assessment pt no longer bleeding, but shallow open area seen. Non-adherent gauze and Tegaderm used, pt and wife made aware to remove dressing later today and replace should bleeding restart.

## 2023-08-03 ENCOUNTER — Other Ambulatory Visit: Payer: Self-pay | Admitting: Oncology

## 2023-08-03 ENCOUNTER — Encounter: Payer: Self-pay | Admitting: Oncology

## 2023-08-03 ENCOUNTER — Ambulatory Visit
Admission: RE | Admit: 2023-08-03 | Discharge: 2023-08-03 | Disposition: A | Discharge status: Home / Self Care | Source: Ambulatory Visit | Attending: Radiation Oncology | Admitting: Radiation Oncology

## 2023-08-03 ENCOUNTER — Other Ambulatory Visit: Payer: Self-pay

## 2023-08-03 ENCOUNTER — Encounter: Payer: Self-pay | Admitting: Radiation Oncology

## 2023-08-03 ENCOUNTER — Inpatient Hospital Stay: Admitting: Nutrition

## 2023-08-03 ENCOUNTER — Other Ambulatory Visit (HOSPITAL_COMMUNITY): Payer: Self-pay

## 2023-08-03 DIAGNOSIS — Z510 Encounter for antineoplastic radiation therapy: Secondary | ICD-10-CM | POA: Diagnosis not present

## 2023-08-03 DIAGNOSIS — Z79899 Other long term (current) drug therapy: Secondary | ICD-10-CM | POA: Diagnosis not present

## 2023-08-03 DIAGNOSIS — M546 Pain in thoracic spine: Secondary | ICD-10-CM | POA: Diagnosis not present

## 2023-08-03 DIAGNOSIS — G893 Neoplasm related pain (acute) (chronic): Secondary | ICD-10-CM | POA: Diagnosis not present

## 2023-08-03 DIAGNOSIS — Z79891 Long term (current) use of opiate analgesic: Secondary | ICD-10-CM | POA: Diagnosis not present

## 2023-08-03 DIAGNOSIS — C090 Malignant neoplasm of tonsillar fossa: Secondary | ICD-10-CM | POA: Diagnosis not present

## 2023-08-03 DIAGNOSIS — Z923 Personal history of irradiation: Secondary | ICD-10-CM | POA: Diagnosis not present

## 2023-08-03 DIAGNOSIS — R1310 Dysphagia, unspecified: Secondary | ICD-10-CM | POA: Diagnosis not present

## 2023-08-03 DIAGNOSIS — Z5111 Encounter for antineoplastic chemotherapy: Secondary | ICD-10-CM | POA: Diagnosis not present

## 2023-08-03 DIAGNOSIS — Z95828 Presence of other vascular implants and grafts: Secondary | ICD-10-CM | POA: Diagnosis not present

## 2023-08-03 DIAGNOSIS — Z931 Gastrostomy status: Secondary | ICD-10-CM | POA: Diagnosis not present

## 2023-08-03 DIAGNOSIS — Z87891 Personal history of nicotine dependence: Secondary | ICD-10-CM | POA: Diagnosis not present

## 2023-08-03 DIAGNOSIS — Z7982 Long term (current) use of aspirin: Secondary | ICD-10-CM | POA: Diagnosis not present

## 2023-08-03 LAB — RAD ONC ARIA SESSION SUMMARY
Course Elapsed Days: 22
Plan Fractions Treated to Date: 17
Plan Prescribed Dose Per Fraction: 2 Gy
Plan Total Fractions Prescribed: 35
Plan Total Prescribed Dose: 70 Gy
Reference Point Dosage Given to Date: 34 Gy
Reference Point Session Dosage Given: 2 Gy
Session Number: 17

## 2023-08-03 MED ORDER — HYDROCODONE-ACETAMINOPHEN 7.5-325 MG/15ML PO SOLN
10.0000 mL | Freq: Four times a day (QID) | ORAL | 0 refills | Status: DC | PRN
Start: 2023-08-03 — End: 2023-08-27
  Filled 2023-08-03: qty 500, 13d supply, fill #0

## 2023-08-03 MED ORDER — ONDANSETRON 8 MG PO TBDP
8.0000 mg | ORAL_TABLET | Freq: Three times a day (TID) | ORAL | 2 refills | Status: DC | PRN
  Filled 2023-08-03: qty 30, 10d supply, fill #0

## 2023-08-03 MED ORDER — DEXAMETHASONE 1 MG/ML PO CONC
ORAL | 0 refills | Status: DC
  Filled 2023-08-03: qty 30, 7d supply, fill #0

## 2023-08-03 MED ORDER — METOCLOPRAMIDE HCL 10 MG/10ML PO SOLN
10.0000 mg | Freq: Three times a day (TID) | ORAL | 1 refills | Status: DC
  Filled 2023-08-03: qty 450, 15d supply, fill #0

## 2023-08-03 NOTE — Progress Notes (Addendum)
 Patient and wife arrived to nutrition follow-up after radiation therapy.  Patient wants to go home.  Said he had a bad night.  Wife would like to try magnesium  citrate to elicit a bowel movement.  She states he still has not had one.  She did give Senokot and MiraLAX last night.  Reports she was able to give him 2 cartons of tube feeding last night.  She feels the tube is  flowing much better and it did not take as long to administer tube feeding.  They declined my offer to check feeding tube this morning.   Sent MD secure chat and he responded quickly agreeing to patient using Magnesium  Citrate. Called [name] on cell with message. She repeated back to me.  Next follow up on Friday with Ottie Blonder during infusion.  **Disclaimer: This note was dictated with voice recognition software. Similar sounding words can inadvertently be transcribed and this note may contain transcription errors which may not have been corrected upon publication of note.**

## 2023-08-04 ENCOUNTER — Other Ambulatory Visit (HOSPITAL_COMMUNITY): Payer: Self-pay

## 2023-08-04 ENCOUNTER — Other Ambulatory Visit: Payer: Self-pay

## 2023-08-04 ENCOUNTER — Inpatient Hospital Stay

## 2023-08-04 ENCOUNTER — Ambulatory Visit
Admission: RE | Admit: 2023-08-04 | Discharge: 2023-08-04 | Disposition: A | Discharge status: Home / Self Care | Source: Ambulatory Visit | Attending: Radiation Oncology | Admitting: Radiation Oncology

## 2023-08-04 VITALS — BP 111/56 | HR 57 | Temp 98.3°F | Resp 16

## 2023-08-04 DIAGNOSIS — Z79899 Other long term (current) drug therapy: Secondary | ICD-10-CM | POA: Diagnosis not present

## 2023-08-04 DIAGNOSIS — C090 Malignant neoplasm of tonsillar fossa: Secondary | ICD-10-CM | POA: Diagnosis not present

## 2023-08-04 DIAGNOSIS — Z510 Encounter for antineoplastic radiation therapy: Secondary | ICD-10-CM | POA: Diagnosis not present

## 2023-08-04 DIAGNOSIS — Z923 Personal history of irradiation: Secondary | ICD-10-CM | POA: Diagnosis not present

## 2023-08-04 DIAGNOSIS — G893 Neoplasm related pain (acute) (chronic): Secondary | ICD-10-CM | POA: Diagnosis not present

## 2023-08-04 DIAGNOSIS — M546 Pain in thoracic spine: Secondary | ICD-10-CM | POA: Diagnosis not present

## 2023-08-04 DIAGNOSIS — Z79891 Long term (current) use of opiate analgesic: Secondary | ICD-10-CM | POA: Diagnosis not present

## 2023-08-04 DIAGNOSIS — Z931 Gastrostomy status: Secondary | ICD-10-CM | POA: Diagnosis not present

## 2023-08-04 DIAGNOSIS — Z87891 Personal history of nicotine dependence: Secondary | ICD-10-CM | POA: Diagnosis not present

## 2023-08-04 DIAGNOSIS — R1310 Dysphagia, unspecified: Secondary | ICD-10-CM | POA: Diagnosis not present

## 2023-08-04 DIAGNOSIS — Z95828 Presence of other vascular implants and grafts: Secondary | ICD-10-CM | POA: Diagnosis not present

## 2023-08-04 DIAGNOSIS — Z7982 Long term (current) use of aspirin: Secondary | ICD-10-CM | POA: Diagnosis not present

## 2023-08-04 DIAGNOSIS — Z5111 Encounter for antineoplastic chemotherapy: Secondary | ICD-10-CM | POA: Diagnosis not present

## 2023-08-04 LAB — RAD ONC ARIA SESSION SUMMARY
Course Elapsed Days: 23
Plan Fractions Treated to Date: 18
Plan Prescribed Dose Per Fraction: 2 Gy
Plan Total Fractions Prescribed: 35
Plan Total Prescribed Dose: 70 Gy
Reference Point Dosage Given to Date: 36 Gy
Reference Point Session Dosage Given: 2 Gy
Session Number: 18

## 2023-08-04 MED ORDER — SODIUM CHLORIDE 0.9 % IV SOLN: Freq: Once | INTRAVENOUS | Status: AC

## 2023-08-04 MED ORDER — SODIUM CHLORIDE 0.9% FLUSH
10.0000 mL | Freq: Once | INTRAVENOUS | Status: AC
  Administered 2023-08-04: 10 mL

## 2023-08-04 MED ORDER — HEPARIN SOD (PORK) LOCK FLUSH 100 UNIT/ML IV SOLN
500.0000 [IU] | Freq: Once | INTRAVENOUS | Status: AC
  Administered 2023-08-04: 500 [IU]

## 2023-08-05 ENCOUNTER — Ambulatory Visit: Admitting: Oncology

## 2023-08-05 ENCOUNTER — Ambulatory Visit
Admission: RE | Admit: 2023-08-05 | Discharge: 2023-08-05 | Disposition: A | Discharge status: Home / Self Care | Source: Ambulatory Visit | Attending: Radiation Oncology | Admitting: Radiation Oncology

## 2023-08-05 ENCOUNTER — Other Ambulatory Visit: Payer: Self-pay | Admitting: Radiology

## 2023-08-05 ENCOUNTER — Other Ambulatory Visit: Payer: Self-pay

## 2023-08-05 ENCOUNTER — Other Ambulatory Visit (HOSPITAL_COMMUNITY): Payer: Self-pay

## 2023-08-05 ENCOUNTER — Inpatient Hospital Stay

## 2023-08-05 ENCOUNTER — Inpatient Hospital Stay: Admitting: Physician Assistant

## 2023-08-05 ENCOUNTER — Telehealth: Payer: Self-pay

## 2023-08-05 VITALS — BP 99/67 | HR 100 | Temp 97.3°F | Resp 17 | Wt 266.0 lb

## 2023-08-05 VITALS — BP 115/56 | HR 76 | Resp 18

## 2023-08-05 DIAGNOSIS — Z95828 Presence of other vascular implants and grafts: Secondary | ICD-10-CM | POA: Diagnosis not present

## 2023-08-05 DIAGNOSIS — G893 Neoplasm related pain (acute) (chronic): Secondary | ICD-10-CM | POA: Diagnosis not present

## 2023-08-05 DIAGNOSIS — C090 Malignant neoplasm of tonsillar fossa: Secondary | ICD-10-CM

## 2023-08-05 DIAGNOSIS — R110 Nausea: Secondary | ICD-10-CM

## 2023-08-05 DIAGNOSIS — Z79899 Other long term (current) drug therapy: Secondary | ICD-10-CM | POA: Diagnosis not present

## 2023-08-05 DIAGNOSIS — Z87891 Personal history of nicotine dependence: Secondary | ICD-10-CM | POA: Diagnosis not present

## 2023-08-05 DIAGNOSIS — Z79891 Long term (current) use of opiate analgesic: Secondary | ICD-10-CM | POA: Diagnosis not present

## 2023-08-05 DIAGNOSIS — R1310 Dysphagia, unspecified: Secondary | ICD-10-CM

## 2023-08-05 DIAGNOSIS — R5383 Other fatigue: Secondary | ICD-10-CM

## 2023-08-05 DIAGNOSIS — M546 Pain in thoracic spine: Secondary | ICD-10-CM | POA: Diagnosis not present

## 2023-08-05 DIAGNOSIS — Z5111 Encounter for antineoplastic chemotherapy: Secondary | ICD-10-CM | POA: Diagnosis not present

## 2023-08-05 DIAGNOSIS — I9589 Other hypotension: Secondary | ICD-10-CM

## 2023-08-05 DIAGNOSIS — F418 Other specified anxiety disorders: Secondary | ICD-10-CM

## 2023-08-05 DIAGNOSIS — Z510 Encounter for antineoplastic radiation therapy: Secondary | ICD-10-CM | POA: Diagnosis not present

## 2023-08-05 DIAGNOSIS — Z7982 Long term (current) use of aspirin: Secondary | ICD-10-CM | POA: Diagnosis not present

## 2023-08-05 DIAGNOSIS — Z931 Gastrostomy status: Secondary | ICD-10-CM | POA: Diagnosis not present

## 2023-08-05 DIAGNOSIS — Z923 Personal history of irradiation: Secondary | ICD-10-CM | POA: Diagnosis not present

## 2023-08-05 LAB — RAD ONC ARIA SESSION SUMMARY
Course Elapsed Days: 24
Plan Fractions Treated to Date: 19
Plan Prescribed Dose Per Fraction: 2 Gy
Plan Total Fractions Prescribed: 35
Plan Total Prescribed Dose: 70 Gy
Reference Point Dosage Given to Date: 38 Gy
Reference Point Session Dosage Given: 2 Gy
Session Number: 19

## 2023-08-05 LAB — CBC WITH DIFFERENTIAL (CANCER CENTER ONLY)
Abs Immature Granulocytes: 0.02 10*3/uL (ref 0.00–0.07)
Basophils Absolute: 0 10*3/uL (ref 0.0–0.1)
Basophils Relative: 0 %
Eosinophils Absolute: 0 10*3/uL (ref 0.0–0.5)
Eosinophils Relative: 0 %
HCT: 37.6 % — ABNORMAL LOW (ref 39.0–52.0)
Hemoglobin: 12.9 g/dL — ABNORMAL LOW (ref 13.0–17.0)
Immature Granulocytes: 1 %
Lymphocytes Relative: 9 %
Lymphs Abs: 0.2 10*3/uL — ABNORMAL LOW (ref 0.7–4.0)
MCH: 31 pg (ref 26.0–34.0)
MCHC: 34.3 g/dL (ref 30.0–36.0)
MCV: 90.4 fL (ref 80.0–100.0)
Monocytes Absolute: 0.3 10*3/uL (ref 0.1–1.0)
Monocytes Relative: 10 %
Neutro Abs: 2.1 10*3/uL (ref 1.7–7.7)
Neutrophils Relative %: 80 %
Platelet Count: 132 10*3/uL — ABNORMAL LOW (ref 150–400)
RBC: 4.16 MIL/uL — ABNORMAL LOW (ref 4.22–5.81)
RDW: 14.1 % (ref 11.5–15.5)
WBC Count: 2.6 10*3/uL — ABNORMAL LOW (ref 4.0–10.5)
nRBC: 0 % (ref 0.0–0.2)

## 2023-08-05 LAB — BASIC METABOLIC PANEL - CANCER CENTER ONLY
Anion gap: 6 (ref 5–15)
BUN: 22 mg/dL (ref 8–23)
CO2: 30 mmol/L (ref 22–32)
Calcium: 8.3 mg/dL — ABNORMAL LOW (ref 8.9–10.3)
Chloride: 98 mmol/L (ref 98–111)
Creatinine: 0.78 mg/dL (ref 0.61–1.24)
GFR, Estimated: >60 mL/min (ref >60)
Glucose, Bld: 151 mg/dL — ABNORMAL HIGH (ref 70–99)
Potassium: 4 mmol/L (ref 3.5–5.1)
Sodium: 134 mmol/L — ABNORMAL LOW (ref 135–145)

## 2023-08-05 LAB — MAGNESIUM: Magnesium: 1.7 mg/dL (ref 1.7–2.4)

## 2023-08-05 MED ORDER — SODIUM CHLORIDE 0.9% FLUSH
10.0000 mL | Freq: Once | INTRAVENOUS | Status: AC
Start: 2023-08-05 — End: 2023-08-05
  Administered 2023-08-05: 10 mL via INTRAVENOUS

## 2023-08-05 MED ORDER — PANTOPRAZOLE SODIUM 40 MG IV SOLR: 20.0000 mg | Freq: Once | INTRAVENOUS | Status: DC

## 2023-08-05 MED ORDER — SUCRALFATE 1 GM/10ML PO SUSP
1.0000 g | Freq: Three times a day (TID) | ORAL | 0 refills | Status: DC
  Filled 2023-08-05: qty 420, 11d supply, fill #0

## 2023-08-05 MED ORDER — ONDANSETRON HCL 4 MG/2ML IJ SOLN
8.0000 mg | Freq: Once | INTRAMUSCULAR | Status: AC
  Administered 2023-08-05: 8 mg via INTRAVENOUS
  Filled 2023-08-05: qty 4

## 2023-08-05 MED ORDER — PANTOPRAZOLE SODIUM 40 MG PO PACK
40.0000 mg | PACK | Freq: Every day | ORAL | 0 refills | Status: DC
  Filled 2023-08-05: qty 30, 30d supply, fill #0

## 2023-08-05 MED ORDER — ESOMEPRAZOLE MAGNESIUM 40 MG PO CPDR
40.0000 mg | DELAYED_RELEASE_CAPSULE | Freq: Every day | ORAL | 0 refills | Status: DC
  Filled 2023-08-05: qty 30, 30d supply, fill #0

## 2023-08-05 MED ORDER — NYSTATIN 100000 UNIT/ML MT SUSP
5.0000 mL | Freq: Three times a day (TID) | OROMUCOSAL | 0 refills | Status: DC
  Filled 2023-08-05 – 2023-08-06 (×2): qty 150, 10d supply, fill #0

## 2023-08-05 MED ORDER — HEPARIN SOD (PORK) LOCK FLUSH 100 UNIT/ML IV SOLN
250.0000 [IU] | Freq: Once | INTRAVENOUS | Status: AC
  Administered 2023-08-05: 250 [IU]

## 2023-08-05 MED ORDER — FAMOTIDINE IN NACL 20-0.9 MG/50ML-% IV SOLN
20.0000 mg | Freq: Once | INTRAVENOUS | Status: AC
  Administered 2023-08-05: 20 mg via INTRAVENOUS
  Filled 2023-08-05: qty 50

## 2023-08-05 MED ORDER — SODIUM CHLORIDE 0.9% FLUSH
10.0000 mL | Freq: Once | INTRAVENOUS | Status: AC
  Administered 2023-08-05: 10 mL

## 2023-08-05 MED ORDER — SODIUM CHLORIDE 0.9 % IV SOLN: INTRAVENOUS | Status: AC

## 2023-08-05 MED ORDER — SUCRALFATE 1 G PO TABS
1.0000 g | ORAL_TABLET | Freq: Four times a day (QID) | ORAL | 0 refills | Status: DC
  Filled 2023-08-05: qty 120, 30d supply, fill #0

## 2023-08-05 MED ORDER — HEPARIN SOD (PORK) LOCK FLUSH 100 UNIT/ML IV SOLN
500.0000 [IU] | Freq: Once | INTRAVENOUS | Status: AC
  Administered 2023-08-05: 500 [IU] via INTRAVENOUS

## 2023-08-05 MED FILL — Fosaprepitant Dimeglumine For IV Infusion 150 MG (Base Eq): INTRAVENOUS | Qty: 5 | Status: AC

## 2023-08-05 NOTE — Progress Notes (Signed)
 [facility]  ONCOLOGY CLINIC PROGRESS NOTE   Patient Care Team: [name], [name], MD as PCP - General (Family Medicine) Cherlyn Cushing, RN as Oncology Nurse Navigator Ernesto Rutherford, MD as Referring Physician (Ophthalmology) Marcine Matar, MD as Consulting Physician (Urology) Margaretmary Dys, MD as Consulting Physician (Radiation Oncology) Maryclare Labrador, RN as Registered Nurse Causey, Larna Daughters, NP as Nurse Practitioner (Hematology and Oncology) Lonie Peak, MD as Attending Physician (Radiation Oncology) Ashok Croon, MD as Consulting Physician (Otolaryngology) Malmfelt, Lise Auer, RN as Oncology Nurse Navigator Meryl Crutch, MD as Consulting Physician (Oncology)  PATIENT NAME: Isaac Carpenter   MR#: [id number] DOB: [birth year]  Date of visit: 08/05/23  CHIEF COMPLAINT/ REASON FOR VISIT:   Follow-up for squamous cell carcinoma of the right tonsil, stage II disease (cT3,cN2,cM0,p16+)  Current Treatment: Concurrent chemoradiation with weekly cisplatin started from 07/09/2023.   HISTORY OF PRESENT ILLNESS:   ONCOLOGY HISTORY:   He presented to his PCP Dr Betty [name] on 05-17-23 with complains of right-sided jaw/maxillary pain that tends to occur when coughing. Pain had persisted for several months at that time following an upper respiratory infection.    Subsequently, he underwent a CT soft tissue neck on 05-17-23 which revealed an ulcerated enhancing mass involving the superior oropharynx and soft palate/uvula measuring 3.4 x 3.0 x 3.6 cm in the greatest extent that is concerning for malignancy. No evidence of cervical lymphadenopathy was indicated on scan    In light of findings, the patient saw Dr. Ashok Croon on 05-27-23 with worsening symptoms. During his visit, he underwent a flexible fiberoptic laryngoscopy with several biopsies of the friable tissue from the right oropharyngeal mass.     Biopsy of right oropharynx mass on 05-27-23 revealed:  Invasive moderately differentiated squamous cell carcinoma. Immunohistochemistry for p16 shows diffuse strong positivity.    During most recent visit with Dr. Shabree Tebbetts Pap on 06-04-23, patient reported constant worsening symptoms of odynophagia and dysphagia. Stating that he's unable to maintain adequate nutrition as food typically consists of few bites of soft food and liquids. Pain does not seem to be managed by pain relievers including oxycodone. To further evaluate the extent of the disease, he underwent a PET scan performed on 06-07-23 revealing a right  oropharyngeal tumor >4cm, likely b/l level II adenopathy, no distant metastases.    cT3,cN2,cM0,p16+ tumor, Stage II disease.    Plan made for concurrent chemoradiation with weekly cisplatin. Started Cisplatin from 07/09/2023 and radiation from 07/12/23.    INTERVAL HISTORY:   Isaac Carpenter returns for follow-up for weekly cisplatin chemotherapy that is scheduled for tomorrow.  He reports having ongoing fatigue that impacts his ADLs.  He has persistent oral mucositis with dysphagia.  He is unable to swallow pills or drink liquids at this time.  His primary nutrition is from the tube feedings.  He is trying to administer 3 cartons per day.  He does have some nausea mainly after each feed.  He is using the Magic mouthwash and lidocaine suspension with minimal relief.  He does have dizziness without any syncopal episodes.  He reports his bowel movements are unchanged from prior.  He denies easy bruising or signs of active bleeding.  He denies fevers, chills, night sweats, shortness of breath, chest pain or cough.  He has no other complaints.  Rest of the ROS as below.   I have reviewed the past medical history, past surgical history, social history and family history with the patient and they are unchanged from  previous note.  REVIEW OF SYSTEMS:   Review of Systems  Constitutional:  Positive for appetite change and fatigue.  HENT:   Positive for sore  throat and trouble swallowing.   Respiratory:  Negative for cough and shortness of breath.   Cardiovascular:  Negative for chest pain and leg swelling.  Gastrointestinal:  Positive for nausea. Negative for abdominal pain, constipation, diarrhea and vomiting.  Genitourinary:  Negative for dysuria.   Skin:  Negative for rash.  Neurological:  Positive for dizziness and light-headedness. Negative for headaches.  Hematological:  Does not bruise/bleed easily.    All other pertinent systems were reviewed with the patient and are negative.  ALLERGIES: He is allergic to testosterone.  MEDICATIONS:  Current Outpatient Medications  Medication Sig Dispense Refill   ALPRAZolam (XANAX) 1 MG tablet Take 0.5 tablets (0.5 mg total) by mouth 2 (two) times daily as needed for anxiety. 60 tablet 0   aspirin EC 81 MG tablet Take 81 mg by mouth daily. Swallow whole.     baclofen (LIORESAL) 10 MG tablet Take 0.5-1 tablets (5-10 mg total) by mouth at bedtime as needed for muscle spasms. 10 each 0   dexamethasone (DECADRON) 1 MG/ML solution Take 4 mL by mouth or G-tube once daily for 3 days after chemo 30 mL 0   doxazosin (CARDURA) 1 MG tablet Take 1 tablet (1 mg total) by mouth at bedtime. 30 tablet 1   fluconazole (DIFLUCAN) 40 MG/ML suspension Take 5mL today, then 2.5mL daily for 20 more days. HOLD ATORVASTATIN WHILE ON THIS. 55 mL 0   HYDROcodone-acetaminophen (HYCET) 7.5-325 mg/15 ml solution Take 10 mLs by mouth 4 (four) times daily as needed for moderate pain (pain score 4-6). 500 mL 0   levothyroxine (SYNTHROID) 88 MCG tablet TAKE ONE TABLET BY MOUTH DAILY. 90 tablet 2   lidocaine (XYLOCAINE) 2 % solution Use as directed 15 mLs in the mouth or throat every 4 (four) hours as needed for mouth pain. 100 mL 1   lidocaine-prilocaine (EMLA) cream Apply to affected area once 30 g 3   lisinopril-hydrochlorothiazide (ZESTORETIC) 20-25 MG tablet Take 1 tablet by mouth once daily. 90 tablet 2   magic mouthwash SOLN  Take 5 mLs by mouth 3 (three) times daily. Suspension contains equal amounts of Maalox Extra Strength, nystatin, and diphenhydramine. 150 mL 0   metoCLOPramide (REGLAN) 10 MG/10ML SOLN Place 10 mLs (10 mg total) into feeding tube 3 (three) times daily before meals. 450 mL 1   Nutritional Supplements (NUTREN 1.5) LIQD 2 cartons Nutren 1.5 (500 ml) QID via tube. Flush with 60 ml water before/after each bolus. Provide additional 250 ml water flush 4x/day in between feedings to meet hydration needs. Provides 3000 kcal, 136 g protein, 1528 ml free water (3008 ml total water) 2000 ml/day meets 100% DRI     ondansetron (ZOFRAN-ODT) 8 MG disintegrating tablet Take 1 tablet (8 mg total) by mouth every 8 (eight) hours as needed for nausea or vomiting. 30 tablet 2   pantoprazole (PROTONIX) 40 MG tablet Take 1 tablet (40 mg total) by mouth daily. 30 tablet 3   solifenacin (VESICARE) 5 MG tablet Take 5 mg by mouth daily.     tamsulosin (FLOMAX) 0.4 MG CAPS capsule Take 0.4 mg by mouth daily after supper.     acetaminophen (TYLENOL) 500 MG tablet Take 1 tablet (500 mg total) by mouth every 6 (six) hours. Please take every 6 hrs and stagger with Motrin 3 hrs apart from each other.  Please do not exceed maximum daily dose to avoid liver damage (Patient not taking: Reported on 08/05/2023) 30 tablet 1   atorvastatin (LIPITOR) 20 MG tablet Take 1 tablet (20 mg total) by mouth daily. (Patient not taking: Reported on 08/05/2023) 90 tablet 2   ibuprofen (ADVIL) 600 MG tablet Take 1 tablet (600 mg total) by mouth every 6 (six) hours. Please take every 6 hrs, and stagger this medication 3 hrs apart from Tylenol (Patient not taking: Reported on 08/05/2023) 30 tablet 0   metoCLOPramide (REGLAN) 10 MG tablet Take 1 tablet (10 mg total) by mouth 3 (three) times daily before meals. (Patient not taking: Reported on 08/05/2023) 30 tablet 3   ondansetron (ZOFRAN) 8 MG tablet Take 1 tablet (8 mg total) by mouth every 8 (eight) hours as needed  for nausea or vomiting. Start on the third day after cisplatin. (Patient not taking: Reported on 07/30/2023) 30 tablet 1   oxyCODONE 10 MG TABS Take 1 tablet (10 mg total) by mouth every 6 (six) hours as needed for severe pain (pain score 7-10). Use 1 tab Q4hrs for pain and if continue to have severe pain ok to take 2 tabs Q4hrs (Patient not taking: Reported on 07/09/2023) 90 tablet 0   prochlorperazine (COMPAZINE) 10 MG tablet Take 1 tablet (10 mg total) by mouth every 6 (six) hours as needed (Nausea or vomiting). (Patient not taking: Reported on 08/05/2023) 30 tablet 1   No current facility-administered medications for this visit.     VITALS:   Blood pressure 99/67, pulse 100, temperature (!) 97.3 F (36.3 C), temperature source Temporal, resp. rate 17, weight 266 lb (120.7 kg), SpO2 97%.  Wt Readings from Last 3 Encounters:  08/05/23 266 lb (120.7 kg)  08/02/23 272 lb 3.2 oz (123.5 kg)  07/30/23 269 lb 1.6 oz (122.1 kg)    Body mass index is 35.09 kg/m.        PHYSICAL EXAM:   Physical Exam Constitutional:      General: He is not in acute distress.    Comments: Appears exhausted. Presented to clinic in a wheelchair today  HENT:     Head: Normocephalic and atraumatic.     Mouth/Throat:     Comments: Right oropharynx tonsil tumor eroding through soft palate, tumor reaches uvula, uvula swollen Eyes:     General: No scleral icterus.    Conjunctiva/sclera: Conjunctivae normal.  Cardiovascular:     Rate and Rhythm: Normal rate and regular rhythm.  Pulmonary:     Effort: Pulmonary effort is normal.     Breath sounds: Normal breath sounds.  Chest:     Comments: Right sided Port-A-Cath on place without any signs of infection Abdominal:     General: There is no distension.     Comments: Feeding tube in place  Musculoskeletal:     Right lower leg: No edema.     Left lower leg: No edema.  Neurological:     General: No focal deficit present.     Mental Status: He is alert and  oriented to person, place, and time.      LABORATORY DATA:   I have reviewed the data as listed.  Results for orders placed or performed in visit on 08/05/23  Rad Onc Aria Session Summary  Result Value Ref Range   Course ID [id number]    Course Start Date 07/05/2023    Session Number 19    Course First Treatment Date 07/12/2023  8:47 AM    Course  Last Treatment Date 08/05/2023 11:07 AM    Course Elapsed Days 24    Reference Point ID [id number]    Reference Point Dosage Given to Date 38.00000019 Gy   Reference Point Session Dosage Given 2.00000001 Gy   Plan ID [email or web address]    Plan Fractions Treated to Date 19    Plan Total Fractions Prescribed 35    Plan Prescribed Dose Per Fraction 2 Gy   Plan Total Prescribed Dose 70.000000 Gy   Plan Primary Reference Point HN dp   Results for orders placed or performed in visit on 08/05/23  CBC with Differential (Cancer Center Only)  Result Value Ref Range   WBC Count 2.6 (L) 4.0 - 10.5 K/uL   RBC 4.16 (L) 4.22 - 5.81 MIL/uL   Hemoglobin 12.9 (L) 13.0 - 17.0 g/dL   HCT 40.9 (L) 81.1 - 91.4 %   MCV 90.4 80.0 - 100.0 fL   MCH 31.0 26.0 - 34.0 pg   MCHC 34.3 30.0 - 36.0 g/dL   RDW 78.2 95.6 - 21.3 %   Platelet Count 132 (L) 150 - 400 K/uL   nRBC 0.0 0.0 - 0.2 %   Neutrophils Relative % 80 %   Neutro Abs 2.1 1.7 - 7.7 K/uL   Lymphocytes Relative 9 %   Lymphs Abs 0.2 (L) 0.7 - 4.0 K/uL   Monocytes Relative 10 %   Monocytes Absolute 0.3 0.1 - 1.0 K/uL   Eosinophils Relative 0 %   Eosinophils Absolute 0.0 0.0 - 0.5 K/uL   Basophils Relative 0 %   Basophils Absolute 0.0 0.0 - 0.1 K/uL   Immature Granulocytes 1 %   Abs Immature Granulocytes 0.02 0.00 - 0.07 K/uL    RADIOGRAPHIC STUDIES:  I have personally reviewed the radiological images as listed and agree with the findings in the report.  No results found.    ASSESSMENT & PLAN:   Isaac Carpenter is a 73 y.o. gentleman with a past medical history of prostate cancer diagnosed in March 2024,  S/P radioactive seed implant/brachytherapy, hypertension, dyslipidemia, hypothyroidism, obstructive sleep apnea. He presented for follow up of recently diagnosed squamous cell carcinoma of the right tonsil, clinical stage II disease (cT3,cN2,cM0,p16+).   #Malignant neoplasm of tonsillar fossa, cT3,cN2,cM0,p16+ tumor, Stage II disease.  -Discussed in tumor conference on 06/09/2023.  Given the extent of disease, consensus opinion is to proceed with concurrent chemoradiation. We have discussed about role of cisplatin being a radiosensitizer in the treatment of head and neck cancer.  We have discussed about the curative intent of chemoradiation for this patient.  Patient was willing to proceed with weekly cisplatin after discussing risk versus benefits and side effect profile.  -He started cycle 1 of cisplatin at a dose of 40 mg/m on 07/09/2023.  Plan was to continue cisplatin weekly during the course of radiation.  -He began radiation treatments from 07/12/2023. PLAN:  -Due for Cycle 5, Day 1 of weekly cisplatin tomorrow -Labs from today were reviewed and adequate for treatment.  WBC 2.6, hemoglobin 12.9, platelet 132, ANC 2.1.Creatinine normal.  Magnesium normal. -Patient is fatigued but wants to continue with treatment that is scheduled for tomorrow.  No further dose modification is recommended at this time. -Return to clinic in 1 week labs and follow-up before cycle 6, day 1.   #Cytopenias: -Secondary to chemotherapy.  WBC 2.6, ANC 2.1, Hgb 12.9, Plt 132.  --Cisplatin was dose reduced from 40 mg changed where to 30 mg/m with the last cycle. --  Monitor for now without further dose modifications.  #Oral mucositis: -Patient reports lidocaine and Magic mouthwash suspensions are minimally improving her his symptoms. -Will send Carafate to help with inflammation. -Continue with baking soda and salt water rinses.  # Dysphagia: -Unable to swallow pills and/or liquids.  Main source of nutrition is  through tube feedings. -We have switched several of his medications to administer through the feeding tube or ODT.   #Fatigue -Secondary to chemotherapy regimen, compounded by dehydration and weight loss.  -Receives IV hydration twice a week.    #Hypotension #Dizziness -BP is 99/67 today -Likely secondary to decreased p.o. intake of fluids.  Wife reports that he is receiving approximately 8 ounces of fluids per feed x 3/day. -Gave another round of IV fluids today with IV Zofran and Pepcid with improvement of symptoms.    I reviewed lab results and outside records for this visit and discussed relevant results with the patient. Diagnosis, plan of care and treatment options were also discussed in detail with the patient. Opportunity provided to ask questions and answers provided to his apparent satisfaction. Provided instructions to call our clinic with any problems, questions or concerns prior to return visit.    I have spent a total of 30 minutes minutes of face-to-face and non-face-to-face time, preparing to see the patient, performing a medically appropriate examination, counseling and educating the patient, ordering medications/tests/procedures, documenting clinical information in the electronic health record, independently interpreting results and communicating results to the patient, and care coordination.   Wyline Hearing PA-C Dept of Hematology and Oncology [facility] Phone: [phone]  No orders of the defined types were placed in this encounter.

## 2023-08-05 NOTE — Telephone Encounter (Signed)
 Oral Oncology Pharmacist Encounter   I spoke to patients wife, Isaac Carpenter, regarding additional information about the medications and which can be administered through the tube. I have placed notes on patients current medication list notating which medications he is crushing and being administered through the tube. Additionally, they have the information in the list form as well.   Patients wife did ask about pantoprazole although this medication cannot go through the tube and notified patients wife she could discuss with PA today at appointment about the pantoprazole.   Akeia Perot, PharmD Hematology/Oncology Clinical Pharmacist [facility] [phone]

## 2023-08-05 NOTE — Addendum Note (Signed)
 Addended by: Nikisha Fleece M on: 08/05/2023 03:17 PM   Modules accepted: Orders

## 2023-08-05 NOTE — Patient Instructions (Signed)
Nausea, Adult Nausea is feeling like you may vomit. Feeling like you may vomit is usually not serious, but it may be an early sign of a more serious medical problem. Vomiting is when stomach contents forcefully come out of your mouth. If you vomit, or if you are not able to drink enough fluids, you may not have enough water in your body (get dehydrated). If you do not have enough water in your body, you may: Feel tired. Feel thirsty. Have a dry mouth. Have cracked lips. Pee (urinate) less often. Older adults and people who have other diseases or a weak body defense system (immune system) have a higher risk of not having enough water in the body. The main goals of treating this condition are: To relieve your nausea. To ensure your nausea occurs less often. To prevent vomiting and losing too much fluid. Follow these instructions at home: Watch your symptoms for any changes. Tell your doctor about them. Eating and drinking     Take an ORS (oral rehydration solution). This is a drink that is sold at pharmacies and stores. Drink clear fluids in small amounts as you are able. These include: Water. Ice chips. Fruit juice that has water added (diluted fruit juice). Low-calorie sports drinks. Eat bland, easy-to-digest foods in small amounts as you are able, such as: Bananas. Applesauce. Rice. Low-fat (lean) meats. Toast. Crackers. Avoid drinking fluids that have a lot of sugar or caffeine in them. This includes energy drinks, sports drinks, and soda. Avoid alcohol. Avoid spicy or fatty foods. General instructions Take over-the-counter and prescription medicines only as told by your doctor. Rest at home while you get better. Drink enough fluid to keep your pee (urine) pale yellow. Take slow and deep breaths when you feel like you may vomit. Avoid food or things that have strong smells. Wash your hands often with soap and water for at least 20 seconds. If you cannot use soap and water,  use hand sanitizer. Make sure that everyone in your home washes their hands well and often. Keep all follow-up visits. Contact a doctor if: You feel worse. You feel like you may vomit and this lasts for more than 2 days. You vomit. You are not able to drink fluids without vomiting. You have new symptoms. You have a fever. You have a headache. You have muscle cramps. You have a rash. You have pain while peeing. You feel light-headed or dizzy. Get help right away if: You have pain in your chest, neck, arm, or jaw. You feel very weak or you faint. You have vomit that is bright red or looks like coffee grounds. You have bloody or black poop (stools) or poop that looks like tar. You have a very bad headache, a stiff neck, or both. You have very bad pain, cramping, or bloating in your belly (abdomen). You have trouble breathing or you are breathing very quickly. Your heart is beating very quickly. Your skin feels cold and clammy. You feel confused. You have signs of losing too much water in your body, such as: Dark pee, very little pee, or no pee. Cracked lips. Dry mouth. Sunken eyes. Sleepiness. Weakness. These symptoms may be an emergency. Get help right away. Call 911. Do not wait to see if the symptoms will go away. Do not drive yourself to the hospital. Summary Nausea is feeling like you are about vomit. If you vomit, or if you are not able to drink enough fluids, you may not have enough water in   your body (get dehydrated). Eat and drink what your doctor tells you. Take over-the-counter and prescription medicines only as told by your doctor. Contact a doctor right away if your symptoms get worse or you have new symptoms. Keep all follow-up visits. This information is not intended to replace advice given to you by your health care provider. Make sure you discuss any questions you have with your health care provider. Document Revised: 10/11/2020 Document Reviewed:  10/11/2020 Elsevier Patient Education  2024 [employer] Inc.  

## 2023-08-06 ENCOUNTER — Other Ambulatory Visit: Payer: Self-pay

## 2023-08-06 ENCOUNTER — Other Ambulatory Visit (HOSPITAL_COMMUNITY): Payer: Self-pay

## 2023-08-06 ENCOUNTER — Encounter: Payer: Self-pay | Admitting: Oncology

## 2023-08-06 ENCOUNTER — Encounter: Payer: Self-pay | Admitting: Radiation Oncology

## 2023-08-06 ENCOUNTER — Inpatient Hospital Stay

## 2023-08-06 ENCOUNTER — Other Ambulatory Visit: Payer: Self-pay | Admitting: Physician Assistant

## 2023-08-06 ENCOUNTER — Inpatient Hospital Stay: Admitting: Dietician

## 2023-08-06 ENCOUNTER — Other Ambulatory Visit

## 2023-08-06 ENCOUNTER — Ambulatory Visit
Admission: RE | Admit: 2023-08-06 | Discharge: 2023-08-06 | Disposition: A | Discharge status: Home / Self Care | Source: Ambulatory Visit | Attending: Radiation Oncology | Admitting: Radiation Oncology

## 2023-08-06 ENCOUNTER — Inpatient Hospital Stay: Admitting: Licensed Clinical Social Worker

## 2023-08-06 VITALS — BP 105/62 | HR 100 | Temp 98.4°F | Resp 18

## 2023-08-06 DIAGNOSIS — Z79899 Other long term (current) drug therapy: Secondary | ICD-10-CM | POA: Diagnosis not present

## 2023-08-06 DIAGNOSIS — Z5111 Encounter for antineoplastic chemotherapy: Secondary | ICD-10-CM | POA: Diagnosis not present

## 2023-08-06 DIAGNOSIS — C090 Malignant neoplasm of tonsillar fossa: Secondary | ICD-10-CM

## 2023-08-06 DIAGNOSIS — Z931 Gastrostomy status: Secondary | ICD-10-CM | POA: Diagnosis not present

## 2023-08-06 DIAGNOSIS — Z95828 Presence of other vascular implants and grafts: Secondary | ICD-10-CM | POA: Diagnosis not present

## 2023-08-06 DIAGNOSIS — R1310 Dysphagia, unspecified: Secondary | ICD-10-CM

## 2023-08-06 DIAGNOSIS — Z510 Encounter for antineoplastic radiation therapy: Secondary | ICD-10-CM | POA: Diagnosis not present

## 2023-08-06 DIAGNOSIS — Z79891 Long term (current) use of opiate analgesic: Secondary | ICD-10-CM | POA: Diagnosis not present

## 2023-08-06 DIAGNOSIS — Z7982 Long term (current) use of aspirin: Secondary | ICD-10-CM | POA: Diagnosis not present

## 2023-08-06 DIAGNOSIS — Z923 Personal history of irradiation: Secondary | ICD-10-CM | POA: Diagnosis not present

## 2023-08-06 DIAGNOSIS — G893 Neoplasm related pain (acute) (chronic): Secondary | ICD-10-CM | POA: Diagnosis not present

## 2023-08-06 DIAGNOSIS — M546 Pain in thoracic spine: Secondary | ICD-10-CM | POA: Diagnosis not present

## 2023-08-06 DIAGNOSIS — Z87891 Personal history of nicotine dependence: Secondary | ICD-10-CM | POA: Diagnosis not present

## 2023-08-06 LAB — RAD ONC ARIA SESSION SUMMARY
Course Elapsed Days: 25
Plan Fractions Treated to Date: 20
Plan Prescribed Dose Per Fraction: 2 Gy
Plan Total Fractions Prescribed: 35
Plan Total Prescribed Dose: 70 Gy
Reference Point Dosage Given to Date: 40 Gy
Reference Point Session Dosage Given: 2 Gy
Session Number: 20

## 2023-08-06 MED ORDER — SODIUM CHLORIDE 0.9 % IV SOLN
INTRAVENOUS | Status: DC
Start: 2023-08-06 — End: 2023-08-06

## 2023-08-06 MED ORDER — PALONOSETRON HCL INJECTION 0.25 MG/5ML
0.2500 mg | Freq: Once | INTRAVENOUS | Status: AC
  Administered 2023-08-06: 0.25 mg via INTRAVENOUS
  Filled 2023-08-06: qty 5

## 2023-08-06 MED ORDER — MAGNESIUM SULFATE 2 GM/50ML IV SOLN
2.0000 g | Freq: Once | INTRAVENOUS | Status: AC
  Administered 2023-08-06: 2 g via INTRAVENOUS
  Filled 2023-08-06: qty 50

## 2023-08-06 MED ORDER — CISPLATIN CHEMO INJECTION 100MG/100ML
30.0000 mg/m2 | Freq: Once | INTRAVENOUS | Status: AC
  Administered 2023-08-06: 77 mg via INTRAVENOUS
  Filled 2023-08-06: qty 77

## 2023-08-06 MED ORDER — FAMOTIDINE IN NACL 20-0.9 MG/50ML-% IV SOLN
20.0000 mg | Freq: Once | INTRAVENOUS | Status: AC
  Administered 2023-08-06: 20 mg via INTRAVENOUS
  Filled 2023-08-06: qty 50

## 2023-08-06 MED ORDER — SODIUM CHLORIDE 0.9% FLUSH
10.0000 mL | INTRAVENOUS | Status: DC | PRN
  Administered 2023-08-06: 10 mL

## 2023-08-06 MED ORDER — POTASSIUM CHLORIDE IN NACL 20-0.9 MEQ/L-% IV SOLN
Freq: Once | INTRAVENOUS | Status: AC
  Filled 2023-08-06: qty 1000

## 2023-08-06 MED ORDER — DEXAMETHASONE SODIUM PHOSPHATE 10 MG/ML IJ SOLN
10.0000 mg | Freq: Once | INTRAMUSCULAR | Status: AC
  Administered 2023-08-06: 10 mg via INTRAVENOUS
  Filled 2023-08-06: qty 1

## 2023-08-06 MED ORDER — HEPARIN SOD (PORK) LOCK FLUSH 100 UNIT/ML IV SOLN
500.0000 [IU] | Freq: Once | INTRAVENOUS | Status: AC | PRN
  Administered 2023-08-06: 500 [IU]

## 2023-08-06 MED ORDER — SODIUM CHLORIDE 0.9 % IV SOLN
150.0000 mg | Freq: Once | INTRAVENOUS | Status: AC
  Administered 2023-08-06: 150 mg via INTRAVENOUS
  Filled 2023-08-06: qty 150

## 2023-08-06 MED ORDER — METOCLOPRAMIDE HCL 5 MG/ML IJ SOLN
5.0000 mg | Freq: Once | INTRAMUSCULAR | Status: AC
  Administered 2023-08-06: 5 mg via INTRAVENOUS
  Filled 2023-08-06: qty 2

## 2023-08-06 MED ORDER — LIDOCAINE VISCOUS HCL 2 % MT SOLN
15.0000 mL | OROMUCOSAL | 1 refills | Status: DC | PRN
  Filled 2023-08-06: qty 100, 2d supply, fill #0

## 2023-08-06 NOTE — Progress Notes (Signed)
 Per Wyline Hearing PA, ok to proceed with Cisplatin  with urine output prior to chemo

## 2023-08-06 NOTE — Progress Notes (Signed)
 [facility] Clinical Social Work  Clinical Social Work was referred by nurse for assessment of psychosocial needs.  Clinical Social Worker met with caregiver and patient to offer support and assess for needs.  Patient was sleeping in infusion- CSW spoke with caregiver, wife Isaac Carpenter. Isaac Carpenter shared that it has been difficult for patient at times and he will feel down but they are trying to look positively towards the end of treatment.    CSW shared information on support programs (support groups, peer mentor program) and provided contact information for pt's primary CSW Nadiyah as well as Edwina Gram with spiritual care.  CSW will request that those team members connect with pt next week.     Isaac Stemen E Chantale Leugers, LCSW  Clinical Social Worker [facility]

## 2023-08-06 NOTE — Patient Instructions (Signed)

## 2023-08-06 NOTE — Progress Notes (Signed)
 Decrease cisplatin  to 30mg /m2 today per [place], [place]

## 2023-08-07 ENCOUNTER — Other Ambulatory Visit: Payer: Self-pay | Admitting: Oncology

## 2023-08-09 ENCOUNTER — Inpatient Hospital Stay

## 2023-08-09 ENCOUNTER — Other Ambulatory Visit: Payer: Self-pay

## 2023-08-09 ENCOUNTER — Other Ambulatory Visit: Payer: Self-pay | Admitting: Radiation Oncology

## 2023-08-09 ENCOUNTER — Ambulatory Visit

## 2023-08-09 ENCOUNTER — Other Ambulatory Visit (HOSPITAL_COMMUNITY): Payer: Self-pay

## 2023-08-09 ENCOUNTER — Other Ambulatory Visit (HOSPITAL_BASED_OUTPATIENT_CLINIC_OR_DEPARTMENT_OTHER): Payer: Self-pay

## 2023-08-09 ENCOUNTER — Ambulatory Visit
Admission: RE | Admit: 2023-08-09 | Discharge: 2023-08-09 | Disposition: A | Discharge status: Home / Self Care | Source: Ambulatory Visit | Attending: Radiation Oncology

## 2023-08-09 VITALS — BP 112/57 | HR 60 | Temp 97.9°F | Resp 16 | Wt 266.1 lb

## 2023-08-09 DIAGNOSIS — C090 Malignant neoplasm of tonsillar fossa: Secondary | ICD-10-CM

## 2023-08-09 DIAGNOSIS — Z87891 Personal history of nicotine dependence: Secondary | ICD-10-CM | POA: Diagnosis not present

## 2023-08-09 DIAGNOSIS — Z931 Gastrostomy status: Secondary | ICD-10-CM | POA: Diagnosis not present

## 2023-08-09 DIAGNOSIS — G893 Neoplasm related pain (acute) (chronic): Secondary | ICD-10-CM | POA: Diagnosis not present

## 2023-08-09 DIAGNOSIS — Z923 Personal history of irradiation: Secondary | ICD-10-CM | POA: Diagnosis not present

## 2023-08-09 DIAGNOSIS — M546 Pain in thoracic spine: Secondary | ICD-10-CM | POA: Diagnosis not present

## 2023-08-09 DIAGNOSIS — Z510 Encounter for antineoplastic radiation therapy: Secondary | ICD-10-CM | POA: Diagnosis not present

## 2023-08-09 DIAGNOSIS — Z5111 Encounter for antineoplastic chemotherapy: Secondary | ICD-10-CM | POA: Diagnosis not present

## 2023-08-09 DIAGNOSIS — R1310 Dysphagia, unspecified: Secondary | ICD-10-CM | POA: Diagnosis not present

## 2023-08-09 DIAGNOSIS — Z79899 Other long term (current) drug therapy: Secondary | ICD-10-CM | POA: Diagnosis not present

## 2023-08-09 DIAGNOSIS — Z79891 Long term (current) use of opiate analgesic: Secondary | ICD-10-CM | POA: Diagnosis not present

## 2023-08-09 DIAGNOSIS — Z95828 Presence of other vascular implants and grafts: Secondary | ICD-10-CM | POA: Diagnosis not present

## 2023-08-09 DIAGNOSIS — Z7982 Long term (current) use of aspirin: Secondary | ICD-10-CM | POA: Diagnosis not present

## 2023-08-09 LAB — RAD ONC ARIA SESSION SUMMARY
Course Elapsed Days: 28
Plan Fractions Treated to Date: 21
Plan Prescribed Dose Per Fraction: 2 Gy
Plan Total Fractions Prescribed: 35
Plan Total Prescribed Dose: 70 Gy
Reference Point Dosage Given to Date: 42 Gy
Reference Point Session Dosage Given: 2 Gy
Session Number: 21

## 2023-08-09 MED ORDER — HEPARIN SOD (PORK) LOCK FLUSH 100 UNIT/ML IV SOLN
500.0000 [IU] | Freq: Once | INTRAVENOUS | Status: AC
Start: 2023-08-09 — End: 2023-08-09
  Administered 2023-08-09: 500 [IU]

## 2023-08-09 MED ORDER — LIDOCAINE VISCOUS HCL 2 % MT SOLN
OROMUCOSAL | 3 refills | Status: DC
  Filled 2023-08-09: qty 300, 5d supply, fill #0
  Filled 2023-08-23: qty 300, 5d supply, fill #1
  Filled 2023-10-07: qty 300, 5d supply, fill #2
  Filled 2023-11-01: qty 300, 5d supply, fill #3

## 2023-08-09 MED ORDER — SODIUM CHLORIDE 0.9% FLUSH
10.0000 mL | Freq: Once | INTRAVENOUS | Status: AC
  Administered 2023-08-09: 10 mL

## 2023-08-09 MED ORDER — SODIUM CHLORIDE 0.9 % IV SOLN: Freq: Once | INTRAVENOUS | Status: AC

## 2023-08-09 NOTE — Progress Notes (Signed)
 Patient declined nausea medicine today.  Patient receiving IVF only.

## 2023-08-09 NOTE — Patient Instructions (Signed)
 How to Care for a Feeding Tube A feeding tube is a soft, flexible tube used to give medicine, water, and liquid food. A person may need a feeding tube if they have trouble swallowing or cannot have food or medicine by mouth. The tube is put right into the stomach.  The following information gives steps on how to care for the skin around a feeding tube, or the tube site. This information is meant for adults and children over 72 year of age. Supplies needed: Clean washcloth, gauze pads, or soft paper towels. Cotton swabs. Skin barrier ointment or cream, such as petroleum jelly. Soap and water, or sterile saline. Pre-cut foam pads or gauze for around the tube. Medical tape. Anchoring device. This is not always used. Syringe. Cleaning brush or toothbrush. This is only used for cleanings. How to care for the tube site  Have all supplies ready and near you. Wash your hands with soap and water for at least 20 seconds. Remove the foam pad or gauze under the tube stabilizing disc (bumper), if there is one. You may have to do this a few times a day right after the feeding tube is put in because the gauze or pad will get soiled or wet. As the site heals, you may not have to replace the pads or gauze as often. But you still need to clean and check the area every day. Gently turn the bumper so it does not stick to the skin. Check the skin around the tube site for redness, rash, swelling, drainage, or growth of extra tissue. Check the number on the tube (guide mark) where it meets the skin. It should not change. If it does, the feeding tube might be coming out. Moisten gauze pads and cotton swabs with water and soap, or saline. Take the moistenedcotton swab and wipe under the bumper, right near the opening in the belly (stoma). Take the moistened gauze pad and clean the skin around the tube site. If you used soap, rinse with water. Use a washcloth, dry gauze pad, or soft paper towel to dry the skin and  stoma site. Dry the tube and bumper too. If the skin is red, put a barrier cream or ointment on a cotton swab. Apply it around the site, under the bumper. This will help the site heal. Put a new pre-cut foam pad or gauze around the tube, under the bumper. If the site is healed and there is no drainage, you can leave off the foam pads or gauze. Put tape around the edges of the foam pad or gauze to keep it in place. Use tape or an anchoring device to hold the loose end of the tube against the skin. This helps keep the tube from getting pulled on. Change where you put the tape to avoid damaging the skin. Throw away used supplies. Wash your hands with soap and water for at least 20 seconds. How to care for the moat If the end of the feeding tube has a moat, clean it once a day or as told. The moat is the open space inside the feeding tube connector. Follow the manufacturer's instructions on how to clean the moat. You may be told to: Remove the cap from the end of the feeding tube. Cover the hole in the middle of the tube port with a cleaning brush. Hold the end of the tube over a deep bowl, or wrap it with paper towels or a washcloth to soak up the water. Use  a syringe of water to flush out the moat. Use the cleaning brush or toothbrush to clean the tube cap and around the inside of the moat. This brush can only be used for tube cleanings. Dry the end of the feeding tube and the cap with gauze or paper towel. Put the cap back on the feeding tube. General tips Use, clean, and reuse feeding tube equipment only as told by the health care provider. Do not use antibiotic ointment or cream on the feeding tube site unless you're told to. Contact a health care provider if: You notice a change in the guide marks on the feeding tube where it meets the skin. Changes could mean the feeding tube has moved or is coming out. You see any of these on the skin around the tube  site: Redness. Rash. Swelling. Drainage. Extra growth of tissue. You have questions or concerns about the feeding tube or the tube site. This information is not intended to replace advice given to you by your health care provider. Make sure you discuss any questions you have with your health care provider. Document Revised: 08/06/2022 Document Reviewed: 08/06/2022 Elsevier Patient Education  2024 [employer].

## 2023-08-10 ENCOUNTER — Other Ambulatory Visit: Payer: Self-pay

## 2023-08-10 ENCOUNTER — Ambulatory Visit
Admission: RE | Admit: 2023-08-10 | Discharge: 2023-08-10 | Disposition: A | Discharge status: Home / Self Care | Source: Ambulatory Visit | Attending: Radiation Oncology | Admitting: Radiation Oncology

## 2023-08-10 DIAGNOSIS — Z95828 Presence of other vascular implants and grafts: Secondary | ICD-10-CM | POA: Diagnosis not present

## 2023-08-10 DIAGNOSIS — Z87891 Personal history of nicotine dependence: Secondary | ICD-10-CM | POA: Diagnosis not present

## 2023-08-10 DIAGNOSIS — Z79891 Long term (current) use of opiate analgesic: Secondary | ICD-10-CM | POA: Diagnosis not present

## 2023-08-10 DIAGNOSIS — R1310 Dysphagia, unspecified: Secondary | ICD-10-CM | POA: Diagnosis not present

## 2023-08-10 DIAGNOSIS — M546 Pain in thoracic spine: Secondary | ICD-10-CM | POA: Diagnosis not present

## 2023-08-10 DIAGNOSIS — G893 Neoplasm related pain (acute) (chronic): Secondary | ICD-10-CM | POA: Diagnosis not present

## 2023-08-10 DIAGNOSIS — Z5111 Encounter for antineoplastic chemotherapy: Secondary | ICD-10-CM | POA: Diagnosis not present

## 2023-08-10 DIAGNOSIS — Z923 Personal history of irradiation: Secondary | ICD-10-CM | POA: Diagnosis not present

## 2023-08-10 DIAGNOSIS — Z79899 Other long term (current) drug therapy: Secondary | ICD-10-CM | POA: Diagnosis not present

## 2023-08-10 DIAGNOSIS — Z7982 Long term (current) use of aspirin: Secondary | ICD-10-CM | POA: Diagnosis not present

## 2023-08-10 DIAGNOSIS — Z931 Gastrostomy status: Secondary | ICD-10-CM | POA: Diagnosis not present

## 2023-08-10 DIAGNOSIS — C090 Malignant neoplasm of tonsillar fossa: Secondary | ICD-10-CM | POA: Diagnosis not present

## 2023-08-10 DIAGNOSIS — Z510 Encounter for antineoplastic radiation therapy: Secondary | ICD-10-CM | POA: Diagnosis not present

## 2023-08-10 LAB — RAD ONC ARIA SESSION SUMMARY
Course Elapsed Days: 29
Plan Fractions Treated to Date: 22
Plan Prescribed Dose Per Fraction: 2 Gy
Plan Total Fractions Prescribed: 35
Plan Total Prescribed Dose: 70 Gy
Reference Point Dosage Given to Date: 44 Gy
Reference Point Session Dosage Given: 2 Gy
Session Number: 22

## 2023-08-11 ENCOUNTER — Inpatient Hospital Stay: Admitting: Dietician

## 2023-08-11 ENCOUNTER — Inpatient Hospital Stay

## 2023-08-11 ENCOUNTER — Encounter: Payer: Self-pay | Admitting: Radiation Oncology

## 2023-08-11 ENCOUNTER — Ambulatory Visit

## 2023-08-11 ENCOUNTER — Ambulatory Visit
Admission: RE | Admit: 2023-08-11 | Discharge: 2023-08-11 | Disposition: A | Discharge status: Home / Self Care | Source: Ambulatory Visit | Attending: Radiation Oncology | Admitting: Radiation Oncology

## 2023-08-11 ENCOUNTER — Telehealth: Payer: Self-pay

## 2023-08-11 ENCOUNTER — Other Ambulatory Visit: Payer: Self-pay

## 2023-08-11 ENCOUNTER — Encounter: Payer: Self-pay | Admitting: Oncology

## 2023-08-11 VITALS — BP 126/44 | HR 110 | Temp 98.0°F | Resp 18

## 2023-08-11 DIAGNOSIS — E1169 Type 2 diabetes mellitus with other specified complication: Secondary | ICD-10-CM | POA: Diagnosis not present

## 2023-08-11 DIAGNOSIS — R5383 Other fatigue: Secondary | ICD-10-CM

## 2023-08-11 DIAGNOSIS — I10 Essential (primary) hypertension: Secondary | ICD-10-CM | POA: Diagnosis not present

## 2023-08-11 DIAGNOSIS — T451X5A Adverse effect of antineoplastic and immunosuppressive drugs, initial encounter: Secondary | ICD-10-CM | POA: Diagnosis not present

## 2023-08-11 DIAGNOSIS — D709 Neutropenia, unspecified: Secondary | ICD-10-CM | POA: Diagnosis not present

## 2023-08-11 DIAGNOSIS — E871 Hypo-osmolality and hyponatremia: Secondary | ICD-10-CM | POA: Diagnosis not present

## 2023-08-11 DIAGNOSIS — Z510 Encounter for antineoplastic radiation therapy: Secondary | ICD-10-CM | POA: Diagnosis not present

## 2023-08-11 DIAGNOSIS — R634 Abnormal weight loss: Secondary | ICD-10-CM | POA: Diagnosis not present

## 2023-08-11 DIAGNOSIS — Z8249 Family history of ischemic heart disease and other diseases of the circulatory system: Secondary | ICD-10-CM | POA: Diagnosis not present

## 2023-08-11 DIAGNOSIS — K1233 Oral mucositis (ulcerative) due to radiation: Secondary | ICD-10-CM | POA: Diagnosis not present

## 2023-08-11 DIAGNOSIS — D701 Agranulocytosis secondary to cancer chemotherapy: Secondary | ICD-10-CM | POA: Diagnosis not present

## 2023-08-11 DIAGNOSIS — F17220 Nicotine dependence, chewing tobacco, uncomplicated: Secondary | ICD-10-CM | POA: Diagnosis not present

## 2023-08-11 DIAGNOSIS — K1231 Oral mucositis (ulcerative) due to antineoplastic therapy: Secondary | ICD-10-CM | POA: Diagnosis not present

## 2023-08-11 DIAGNOSIS — R5081 Fever presenting with conditions classified elsewhere: Secondary | ICD-10-CM | POA: Diagnosis not present

## 2023-08-11 DIAGNOSIS — R300 Dysuria: Secondary | ICD-10-CM | POA: Diagnosis not present

## 2023-08-11 DIAGNOSIS — C090 Malignant neoplasm of tonsillar fossa: Secondary | ICD-10-CM

## 2023-08-11 DIAGNOSIS — R509 Fever, unspecified: Secondary | ICD-10-CM | POA: Diagnosis not present

## 2023-08-11 DIAGNOSIS — D696 Thrombocytopenia, unspecified: Secondary | ICD-10-CM | POA: Diagnosis not present

## 2023-08-11 DIAGNOSIS — E890 Postprocedural hypothyroidism: Secondary | ICD-10-CM | POA: Diagnosis not present

## 2023-08-11 DIAGNOSIS — D509 Iron deficiency anemia, unspecified: Secondary | ICD-10-CM | POA: Diagnosis not present

## 2023-08-11 DIAGNOSIS — Z931 Gastrostomy status: Secondary | ICD-10-CM | POA: Diagnosis not present

## 2023-08-11 DIAGNOSIS — D849 Immunodeficiency, unspecified: Secondary | ICD-10-CM | POA: Diagnosis not present

## 2023-08-11 DIAGNOSIS — E785 Hyperlipidemia, unspecified: Secondary | ICD-10-CM | POA: Diagnosis not present

## 2023-08-11 DIAGNOSIS — Z79899 Other long term (current) drug therapy: Secondary | ICD-10-CM | POA: Diagnosis not present

## 2023-08-11 DIAGNOSIS — T451X5D Adverse effect of antineoplastic and immunosuppressive drugs, subsequent encounter: Secondary | ICD-10-CM | POA: Diagnosis not present

## 2023-08-11 LAB — RAD ONC ARIA SESSION SUMMARY
Course Elapsed Days: 30
Plan Fractions Treated to Date: 23
Plan Prescribed Dose Per Fraction: 2 Gy
Plan Total Fractions Prescribed: 35
Plan Total Prescribed Dose: 70 Gy
Reference Point Dosage Given to Date: 46 Gy
Reference Point Session Dosage Given: 2 Gy
Session Number: 23

## 2023-08-11 MED ORDER — SODIUM CHLORIDE 0.9 % IV SOLN: Freq: Once | INTRAVENOUS | Status: AC

## 2023-08-11 NOTE — Progress Notes (Signed)
 Nutrition Follow-up:  Pt with stage II SCC of right tonsil, p16 positive. He is planning to receive concurrent chemoradiation (first chemo 3/21). First RT planned 3/24. Patient is under the care of Dr. Lurena Sally and Dr. Randye Buttner.   S/p Gtube 2/28 DME: [facility]   Met with patient and wife in infusion. He is receiving IVF today. Wife is giving bolus feeding while patient sleeps at visit. Wife reports patient feeling dizzy this morning. Concerns about patient falling at home. He does not have a walker. Patient tolerating 5-6 cartons of Nutren (goal is 8). Reglan  is helping with motility. Patient did not get last carton last night s/p hydrocodone . Patient is sensitive to pain medications and unable to wake patient enough to get a feeding in. Patient had a total of 5 cartons Nutren 1.5 (1875 kcal, 85g). Wife reports constipation has resolved. Patient having one loose stool/day. Denies nausea or vomiting.    Medications: MMW  Labs: no new labs  Anthropometrics: Last wt 266 lb 1.6 oz on 4/21  4/17 - 266 lb  4/11 - 269 lb 1.6 oz   Estimated Energy Needs  Kcals: 2800-3180 Protein: 140-153 Fluid: >2.8 L  NUTRITION DIAGNOSIS: Unintended wt loss - ongoing   INTERVENTION:  Continue working to increase TF to goal as tolerated Continue supportive care with IVF Continue baking soda salt water  rinses Requested orders for walker     MONITORING, EVALUATION, GOAL: wt trends, intake   NEXT VISIT: Friday May 2 during infusion

## 2023-08-11 NOTE — Progress Notes (Signed)
 Per Dr Bearl Botts to run IVF over 1 hour today due to the patient arriving late

## 2023-08-11 NOTE — Telephone Encounter (Signed)
 LVM to inform patient's wife that a front wheel walker has been ordered for Isaac Carpenter through [employer] and that this company would be reaching out to them soon as they obtain pre-approval.

## 2023-08-11 NOTE — Patient Instructions (Signed)
 Dehydration, Adult Dehydration is a condition in which there is not enough water or other fluids in the body. This happens when a person loses more fluids than they take in. Important organs cannot work right without the right amount of fluids. Any loss of fluids from the body can cause dehydration. Dehydration can be mild, worse, or very bad. It should be treated right away to keep it from getting very bad. What are the causes? Conditions that cause loss of water in the body. They include: Watery poop (diarrhea). Vomiting. Sweating a lot. Fever. Infection. Peeing (urinating) a lot. Not drinking enough fluids. Certain medicines, such as medicines that take extra fluid out of the body (diuretics). Lack of safe drinking water. Not being able to get enough water and food. What increases the risk? Having a long-term (chronic) illness that has not been treated the right way, such as: Diabetes. Heart disease. Kidney disease. Being 25 years of age or older. Having a disability. Living in a place that is high above the ground or sea (high in altitude). The thinner, drier air causes more fluid loss. Doing exercises that put stress on your body for a long time. Being active when in hot places. What are the signs or symptoms? Symptoms of dehydration depend on how bad it is. Mild or worse dehydration Thirst. Dry lips or dry mouth. Feeling dizzy or light-headed. Muscle cramps. Passing little pee or dark pee. Pee may be the color of tea. Headache. Very bad dehydration Changes in skin. Skin may: Be cold to the touch (clammy). Be blotchy or pale. Not go back to normal right after you pinch it and let it go. Little or no tears, pee, or sweat. Fast breathing. Low blood pressure. Weak pulse. Pulse that is more than 100 beats a minute when you are sitting still. Other changes, such as: Feeling very thirsty. Eyes that look hollow (sunken). Cold hands and feet. Being confused. Being very  tired (lethargic) or having trouble waking from sleep. Losing weight. Loss of consciousness. How is this treated? Treatment for this condition depends on how bad your dehydration is. Treatment should start right away. Do not wait until your condition gets very bad. Very bad dehydration is an emergency. You will need to go to a hospital. Mild or worse dehydration can be treated at home. You may be asked to: Drink more fluids. Drink an oral rehydration solution (ORS). This drink gives you the right amount of fluids, salts, and minerals (electrolytes). Very bad dehydration can be treated: With fluids through an IV tube. By correcting low levels of electrolytes in the body. By treating the problem that caused your dehydration. Follow these instructions at home: Oral rehydration solution If told by your doctor, drink an ORS: Make an ORS. Use instructions on the package. Start by drinking small amounts, about  cup (120 mL) every 5-10 minutes. Slowly drink more until you have had the amount that your doctor said to have.  Eating and drinking  Drink enough clear fluid to keep your pee pale yellow. If you were told to drink an ORS, finish the ORS first. Then, start slowly drinking other clear fluids. Drink fluids such as: Water. Do not drink only water. Doing that can make the salt (sodium) level in your body get too low. Water from ice chips you suck on. Fruit juice that you have added water to (diluted). Low-calorie sports drinks. Eat foods that have the right amounts of salts and minerals, such as bananas, oranges, potatoes,  tomatoes, or spinach. Do not drink alcohol. Avoid drinks that have caffeine or sugar. These include:: High-calorie sports drinks. Fruit juice that you did not add water to. Soda. Coffee or energy drinks. Avoid foods that are greasy or have a lot of fat or sugar. General instructions Take over-the-counter and prescription medicines only as told by your doctor. Do  not take sodium tablets. Doing that can make the salt level in your body get too high. Return to your normal activities as told by your doctor. Ask your doctor what activities are safe for you. Keep all follow-up visits. Your doctor may check and change your treatment. Contact a doctor if: You have pain in your belly (abdomen) and the pain: Gets worse. Stays in one place. You have a rash. You have a stiff neck. You get angry or annoyed more easily than normal. You are more tired or have a harder time waking than normal. You feel weak or dizzy. You feel very thirsty. Get help right away if: You have any symptoms of very bad dehydration. You vomit every time you eat or drink. Your vomiting gets worse, does not go away, or you vomit blood or green stuff. You are getting treatment, but symptoms are getting worse. You have a fever. You have a very bad headache. You have: Diarrhea that gets worse or does not go away. Blood in your poop (stool). This may cause poop to look black and tarry. No pee in 6-8 hours. Only a small amount of pee in 6-8 hours, and the pee is very dark. You have trouble breathing. These symptoms may be an emergency. Get help right away. Call 911. Do not wait to see if the symptoms will go away. Do not drive yourself to the hospital. This information is not intended to replace advice given to you by your health care provider. Make sure you discuss any questions you have with your health care provider. Document Revised: 11/03/2021 Document Reviewed: 11/03/2021 Elsevier Patient Education  2024 [employer].

## 2023-08-12 ENCOUNTER — Other Ambulatory Visit (HOSPITAL_COMMUNITY): Payer: Self-pay

## 2023-08-12 ENCOUNTER — Ambulatory Visit
Admission: RE | Admit: 2023-08-12 | Discharge: 2023-08-12 | Disposition: A | Discharge status: Home / Self Care | Source: Ambulatory Visit | Attending: Radiation Oncology | Admitting: Radiation Oncology

## 2023-08-12 ENCOUNTER — Other Ambulatory Visit: Payer: Self-pay

## 2023-08-12 DIAGNOSIS — Z510 Encounter for antineoplastic radiation therapy: Secondary | ICD-10-CM | POA: Diagnosis not present

## 2023-08-12 DIAGNOSIS — C090 Malignant neoplasm of tonsillar fossa: Secondary | ICD-10-CM | POA: Diagnosis not present

## 2023-08-12 LAB — RAD ONC ARIA SESSION SUMMARY
Course Elapsed Days: 31
Plan Fractions Treated to Date: 24
Plan Prescribed Dose Per Fraction: 2 Gy
Plan Total Fractions Prescribed: 35
Plan Total Prescribed Dose: 70 Gy
Reference Point Dosage Given to Date: 48 Gy
Reference Point Session Dosage Given: 2 Gy
Session Number: 24

## 2023-08-12 MED FILL — Fosaprepitant Dimeglumine For IV Infusion 150 MG (Base Eq): INTRAVENOUS | Qty: 5 | Status: AC

## 2023-08-13 ENCOUNTER — Inpatient Hospital Stay: Admitting: Oncology

## 2023-08-13 ENCOUNTER — Other Ambulatory Visit: Payer: Self-pay

## 2023-08-13 ENCOUNTER — Telehealth: Payer: Self-pay

## 2023-08-13 ENCOUNTER — Telehealth: Payer: Self-pay | Admitting: Oncology

## 2023-08-13 ENCOUNTER — Other Ambulatory Visit (HOSPITAL_COMMUNITY): Payer: Self-pay

## 2023-08-13 ENCOUNTER — Encounter: Payer: Self-pay | Admitting: Radiation Oncology

## 2023-08-13 ENCOUNTER — Encounter: Payer: Self-pay | Admitting: Oncology

## 2023-08-13 ENCOUNTER — Inpatient Hospital Stay

## 2023-08-13 ENCOUNTER — Ambulatory Visit
Admission: RE | Admit: 2023-08-13 | Discharge: 2023-08-13 | Disposition: A | Discharge status: Home / Self Care | Source: Ambulatory Visit | Attending: Radiation Oncology | Admitting: Radiation Oncology

## 2023-08-13 VITALS — BP 94/57 | HR 105 | Temp 98.3°F | Resp 18 | Wt 263.7 lb

## 2023-08-13 DIAGNOSIS — C090 Malignant neoplasm of tonsillar fossa: Secondary | ICD-10-CM

## 2023-08-13 DIAGNOSIS — I10 Essential (primary) hypertension: Secondary | ICD-10-CM | POA: Diagnosis not present

## 2023-08-13 DIAGNOSIS — Z510 Encounter for antineoplastic radiation therapy: Secondary | ICD-10-CM | POA: Diagnosis not present

## 2023-08-13 DIAGNOSIS — Z7689 Persons encountering health services in other specified circumstances: Secondary | ICD-10-CM

## 2023-08-13 DIAGNOSIS — T451X5D Adverse effect of antineoplastic and immunosuppressive drugs, subsequent encounter: Secondary | ICD-10-CM

## 2023-08-13 DIAGNOSIS — K1231 Oral mucositis (ulcerative) due to antineoplastic therapy: Secondary | ICD-10-CM

## 2023-08-13 DIAGNOSIS — R634 Abnormal weight loss: Secondary | ICD-10-CM

## 2023-08-13 DIAGNOSIS — D701 Agranulocytosis secondary to cancer chemotherapy: Secondary | ICD-10-CM | POA: Diagnosis not present

## 2023-08-13 LAB — BASIC METABOLIC PANEL - CANCER CENTER ONLY
Anion gap: 7 (ref 5–15)
BUN: 17 mg/dL (ref 8–23)
CO2: 31 mmol/L (ref 22–32)
Calcium: 7.9 mg/dL — ABNORMAL LOW (ref 8.9–10.3)
Chloride: 93 mmol/L — ABNORMAL LOW (ref 98–111)
Creatinine: 0.55 mg/dL — ABNORMAL LOW (ref 0.61–1.24)
GFR, Estimated: >60 mL/min (ref >60)
Glucose, Bld: 142 mg/dL — ABNORMAL HIGH (ref 70–99)
Potassium: 4 mmol/L (ref 3.5–5.1)
Sodium: 131 mmol/L — ABNORMAL LOW (ref 135–145)

## 2023-08-13 LAB — CBC WITH DIFFERENTIAL (CANCER CENTER ONLY)
Abs Immature Granulocytes: 0.01 10*3/uL (ref 0.00–0.07)
Basophils Absolute: 0 10*3/uL (ref 0.0–0.1)
Basophils Relative: 1 %
Eosinophils Absolute: 0 10*3/uL (ref 0.0–0.5)
Eosinophils Relative: 0 %
HCT: 31.7 % — ABNORMAL LOW (ref 39.0–52.0)
Hemoglobin: 11 g/dL — ABNORMAL LOW (ref 13.0–17.0)
Immature Granulocytes: 1 %
Lymphocytes Relative: 17 %
Lymphs Abs: 0.1 10*3/uL — ABNORMAL LOW (ref 0.7–4.0)
MCH: 31 pg (ref 26.0–34.0)
MCHC: 34.7 g/dL (ref 30.0–36.0)
MCV: 89.3 fL (ref 80.0–100.0)
Monocytes Absolute: 0.1 10*3/uL (ref 0.1–1.0)
Monocytes Relative: 14 %
Neutro Abs: 0.5 10*3/uL — ABNORMAL LOW (ref 1.7–7.7)
Neutrophils Relative %: 67 %
Platelet Count: 78 10*3/uL — ABNORMAL LOW (ref 150–400)
RBC: 3.55 MIL/uL — ABNORMAL LOW (ref 4.22–5.81)
RDW: 14.3 % (ref 11.5–15.5)
Smear Review: NORMAL
WBC Count: 0.7 10*3/uL — CL (ref 4.0–10.5)
nRBC: 0 % (ref 0.0–0.2)

## 2023-08-13 LAB — RAD ONC ARIA SESSION SUMMARY
Course Elapsed Days: 32
Plan Fractions Treated to Date: 25
Plan Prescribed Dose Per Fraction: 2 Gy
Plan Total Fractions Prescribed: 35
Plan Total Prescribed Dose: 70 Gy
Reference Point Dosage Given to Date: 50 Gy
Reference Point Session Dosage Given: 2 Gy
Session Number: 25

## 2023-08-13 LAB — MAGNESIUM: Magnesium: 1.2 mg/dL — ABNORMAL LOW (ref 1.7–2.4)

## 2023-08-13 MED ORDER — CIPROFLOXACIN 500 MG/5ML (10%) PO SUSR
500.0000 mg | Freq: Two times a day (BID) | ORAL | 0 refills | Status: DC
  Filled 2023-08-13 (×4): qty 100, 10d supply, fill #0

## 2023-08-13 MED ORDER — SODIUM CHLORIDE 0.9 % IV SOLN: Freq: Once | INTRAVENOUS | Status: AC

## 2023-08-13 MED ORDER — SODIUM CHLORIDE 0.9% FLUSH
10.0000 mL | Freq: Once | INTRAVENOUS | Status: AC
  Administered 2023-08-13: 10 mL

## 2023-08-13 MED ORDER — HEPARIN SOD (PORK) LOCK FLUSH 100 UNIT/ML IV SOLN
500.0000 [IU] | Freq: Once | INTRAVENOUS | Status: AC
  Administered 2023-08-13: 500 [IU]

## 2023-08-13 MED ORDER — FAMOTIDINE IN NACL 20-0.9 MG/50ML-% IV SOLN
20.0000 mg | Freq: Once | INTRAVENOUS | Status: AC
Start: 2023-08-13 — End: 2023-08-13
  Administered 2023-08-13: 20 mg via INTRAVENOUS
  Filled 2023-08-13: qty 50

## 2023-08-13 MED ORDER — ONDANSETRON HCL 4 MG/2ML IJ SOLN
8.0000 mg | Freq: Once | INTRAMUSCULAR | Status: AC
  Administered 2023-08-13: 8 mg via INTRAVENOUS
  Filled 2023-08-13: qty 4

## 2023-08-13 NOTE — Telephone Encounter (Signed)
 Received telephone call from the lab: [facility] Reporting Critical Lab Value: white count 0.7 Notified Dr. Vernita Goodnight .

## 2023-08-13 NOTE — Progress Notes (Signed)
 [facility]  ONCOLOGY CLINIC PROGRESS NOTE   Patient Care Team: [name], [name], MD as PCP - General (Family Medicine) Katheleen Palmer, RN as Oncology Nurse Navigator Maris Sickle, MD as Referring Physician (Ophthalmology) Trent Frizzle, MD as Consulting Physician (Urology) Kenith Payer, MD as Consulting Physician (Radiation Oncology) Neda Balk, RN as Registered Nurse Debbie Fails, Laura Polio, NP as Nurse Practitioner (Hematology and Oncology) Colie Dawes, MD as Attending Physician (Radiation Oncology) Artice Last, MD as Consulting Physician (Otolaryngology) Malmfelt, Nancyann Aye, RN as Oncology Nurse Navigator Arlo Berber, MD as Consulting Physician (Oncology)  PATIENT NAME: Isaac Carpenter   MR#: [id number] DOB: 07-28-1950  Date of visit: 08/13/2023   ASSESSMENT & PLAN:   Isaac Carpenter is a 73 y.o. gentleman with a past medical history of prostate cancer diagnosed in March 2024, S/P radioactive seed implant/brachytherapy, hypertension, dyslipidemia, hypothyroidism, obstructive sleep apnea. He presented for follow up of recently diagnosed squamous cell carcinoma of the right tonsil, clinical stage II disease (cT3,cN2,cM0,p16+).   Malignant neoplasm of tonsillar fossa (HCC) Please review oncology history for additional details and timeline of events.  Symptoms include severe pain and dysphagia since mid-November, progressively worsening.   cT3,cN2,cM0,p16+ tumor, Stage II disease.   His case was discussed in tumor conference on 06/09/2023.  Given the extent of disease, consensus opinion is to proceed with concurrent chemoradiation. We have discussed about role of cisplatin  being a radiosensitizer in the treatment of head and neck cancer.  We have discussed about the curative intent of chemoradiation for this patient.  Patient was willing to proceed with weekly cisplatin  after discussing risk versus benefits and side effect profile.  He started  cycle 1 of cisplatin  at a dose of 40 mg/m on 07/09/2023.  Plan was to continue cisplatin  weekly during the course of radiation.  He began radiation treatments from 07/12/2023.  During week 2, chemotherapy had to be held because of severe fatigue with ECOG performance status of 3.  Ongoing treatment with chemotherapy and radiation. Radiation therapy is the primary treatment and will continue as planned. He is nearing the end of the treatment course, with a couple of weeks remaining. He expressed uncertainty about continuing treatment but was encouraged to proceed given the proximity to completion.  He was due for cycle 5 of chemotherapy today.  However he developed neutropenia with ANC of 500.  We are holding chemotherapy this week.  Zarxio  support next week for 3 days will be planned, pending authorization.  Will continue with dose reduced chemotherapy to 30 mg/m, upon resumption.  - Ensure home hydration (80-90 oz non-caffeinated fluids daily).  Patient's wife was encouraged to start using feeding tube as suggested by the nutritionist and also to use feeding tube for hydration.  - Monitor and replace electrolytes as needed  RTC in 1 week for labs, follow-up and possible resumption of chemotherapy.  Chemotherapy induced neutropenia (HCC) Severe neutropenia with white blood cell count dropping to 700 from 2600 last week, likely due to chemotherapy. Discussed the need to monitor for signs of infection and maintain a safe environment to prevent infections. Antibiotic prescribed for prophylaxis. - Hold chemotherapy treatment. - Request authorization for Zarxio  injection, to be given on Monday, Tuesday, and Wednesday next week during fluid and radiation appointments. - Prescribed ciprofloxacin  500 mg twice daily, liquid formulation for infection prevention. - Monitor for fever and signs of infection.  Oral mucositis Oral mucositis with raw sensation on the right side of the tongue. He has  lidocaine  at home for symptomatic relief. - Use lidocaine  for oral mucositis.  Weight loss He has lost three pounds, which is not unexpected given the treatment course. Efforts to maintain nutrition are ongoing, and Reglan  has been helpful in managing symptoms.  Hypertension Currently holding antihypertensive medications due to soft blood pressures and risk of further drop. - Send separate prescriptions for lisinopril  and hydrochlorothiazide  in case he is needed.  I reviewed lab results and outside records for this visit and discussed relevant results with the patient. Diagnosis, plan of care and treatment options were also discussed in detail with the patient. Opportunity provided to ask questions and answers provided to his apparent satisfaction. Provided instructions to call our clinic with any problems, questions or concerns prior to return visit. I recommended to continue follow-up with PCP and sub-specialists. He verbalized understanding and agreed with the plan.   NCCN guidelines have been consulted in the planning of this patient's care.  I spent a total of 40 minutes during this encounter with the patient including review of chart and various tests results, discussions about plan of care and coordination of care plan.   Arlo Berber, MD  08/13/2023 4:57 PM  [facility] - A DEPT OF [facility] [address] [place] [address] Dept: [phone] Dept Fax: [phone]    CHIEF COMPLAINT/ REASON FOR VISIT:   Follow-up for squamous cell carcinoma of the right tonsil, stage II disease (cT3,cN2,cM0,p16+)  Current Treatment: Concurrent chemoradiation with weekly cisplatin  started from 07/09/2023.  INTERVAL HISTORY:    Discussed the use of AI scribe software for clinical note transcription with the patient, who gave verbal consent to proceed.   Isaac Carpenter is here today for repeat clinical assessment.   He  started concurrent chemoradiation with weekly cisplatin  on 07/09/2023.  History of Present Illness  Isaac Carpenter "Isaac Carpenter" is a 73 year old male undergoing treatment for head and neck cancer who presents with low white blood cell count.  He is currently undergoing chemotherapy and radiation for head and neck cancer. His white blood cell count has significantly decreased to 700 from 2,600 last week and 3,100 the week before, leading to a temporary hold on chemotherapy.  He experiences low energy levels, likely due to ongoing chemotherapy and radiation. His hemoglobin level is 11, which is low but not at a transfusion threshold. His platelet count is 78,000, which is low but not low enough to halt treatment based on platelet count alone.  He has lost three pounds recently, which is concerning given his cancer treatment. He uses Reglan , which he finds helpful.  He has a raw sensation on the right side of his tongue and uses lidocaine  for relief.  His blood pressure readings have been inconsistent, with one arm showing high readings and the other showing typical levels. He is not currently taking his blood pressure medications, lisinopril  and hydrochlorothiazide , but has requested them to be prescribed separately so he can crush them if needed.    I have reviewed the past medical history, past surgical history, social history and family history with the patient and they are unchanged from previous note.  HISTORY OF PRESENT ILLNESS:   ONCOLOGY HISTORY:   He presented to his PCP Dr Betty [name] on 05-17-23 with complains of right-sided jaw/maxillary pain that tends to occur when coughing. Pain had persisted for several months at that time following an upper respiratory infection.  Subsequently, he underwent a CT soft tissue neck on 05-17-23 which revealed an ulcerated enhancing mass involving the superior oropharynx and soft palate/uvula measuring 3.4 x 3.0 x 3.6 cm in the greatest extent that is  concerning for malignancy. No evidence of cervical lymphadenopathy was indicated on scan    In light of findings, the patient saw Dr. Artice Last on 05-27-23 with worsening symptoms. During his visit, he underwent a flexible fiberoptic laryngoscopy with several biopsies of the friable tissue from the right oropharyngeal mass.     Biopsy of right oropharynx mass on 05-27-23 revealed: Invasive moderately differentiated squamous cell carcinoma. Immunohistochemistry for p16 shows diffuse strong positivity.    During most recent visit with Dr. Soldatova on 06-04-23, patient reported constant worsening symptoms of odynophagia and dysphagia. Stating that he's unable to maintain adequate nutrition as food typically consists of few bites of soft food and liquids. Pain does not seem to be managed by pain relievers including oxycodone . To further evaluate the extent of the disease, he underwent a PET scan performed on 06-07-23 revealing a right  oropharyngeal tumor >4cm, likely b/l level II adenopathy, no distant metastases.    cT3,cN2,cM0,p16+ tumor, Stage II disease.    Plan made for concurrent chemoradiation with weekly cisplatin . Started Cisplatin  from 07/09/2023 and radiation from 07/12/23.   Oncology History  Malignant neoplasm of prostate (HCC)  05/13/2022 Cancer Staging   Staging form: Prostate, AJCC 8th Edition - Clinical stage from 05/13/2022: Stage IIC (cT1c, cN0, cM0, PSA: 13.2, Grade Group: 3) - Signed by Keitha Pata, PA-C on 07/17/2022 Histopathologic type: Adenocarcinoma, NOS Stage prefix: Initial diagnosis Prostate specific antigen (PSA) range: 10 to 19 Gleason primary pattern: 4 Gleason secondary pattern: 3 Gleason score: 7 Histologic grading system: 5 grade system Number of biopsy cores examined: 12 Number of biopsy cores positive: 4 Location of positive needle core biopsies: Both sides   07/17/2022 Initial Diagnosis   Malignant neoplasm of prostate (HCC)   Malignant neoplasm of  tonsillar fossa (HCC)  06/08/2023 Initial Diagnosis   Malignant neoplasm of tonsillar fossa (HCC)   06/08/2023 Cancer Staging   Staging form: Pharynx - HPV-Mediated Oropharynx, AJCC 8th Edition - Clinical stage from 06/08/2023: Stage II (cT3, cN2, cM0, p16+) - Signed by Colie Dawes, MD on 06/08/2023 Stage prefix: Initial diagnosis   07/09/2023 -  Chemotherapy   Patient is on Treatment Plan : HEAD/NECK Cisplatin  (40) q7d         REVIEW OF SYSTEMS:   Review of Systems - Oncology  All other pertinent systems were reviewed with the patient and are negative.  ALLERGIES: He is allergic to testosterone.  MEDICATIONS:  Current Outpatient Medications  Medication Sig Dispense Refill   ciprofloxacin  (CIPRO ) 500 MG/5ML (10%) suspension Take 5 mLs (500 mg total) by mouth 2 (two) times daily. Do NOT administer via tube 100 mL 0   dexamethasone  (DEXAMETHASONE  INTENSOL) 1 MG/ML solution TAKE BY MOUTH OR G-TUBE ONCE DAILY FOR THREE DAYS AFTER CHEMO 30 mL 0   doxazosin  (CARDURA ) 1 MG tablet Take 1 tablet (1 mg total) by mouth at bedtime. 30 tablet 1   esomeprazole  (NEXIUM ) 40 MG capsule Take 1 capsule (40 mg total) by mouth daily at 12 noon. See instructions to administer through Tube. 30 capsule 0   fluconazole  (DIFLUCAN ) 40 MG/ML suspension Take 5mL today, then 2.5mL daily for 20 more days. HOLD ATORVASTATIN  WHILE ON THIS. 55 mL 0   HYDROcodone -acetaminophen  (HYCET) 7.5-325 mg/15 ml solution Take 10 mLs by mouth 4 (  four) times daily as needed for moderate pain (pain score 4-6). 500 mL 0   levothyroxine  (SYNTHROID ) 88 MCG tablet TAKE ONE TABLET BY MOUTH DAILY. 90 tablet 2   lidocaine  (XYLOCAINE ) 2 % solution Patient: Mix 1part 2% viscous lidocaine , 1part water . Swish and spit 10mL of diluted mixture every 2 hours as needed. 300 mL 3   lidocaine -prilocaine  (EMLA ) cream Apply to affected area once 30 g 3   magic mouthwash (nystatin , diphenhydrAMINE, alum & mag hydroxide) suspension mixture Take 5  mLs by mouth 3 (three) times daily as directed. 150 mL 0   metoCLOPramide  (REGLAN ) 10 MG/10ML SOLN Place 10 mLs (10 mg total) into feeding tube 3 (three) times daily before meals. 450 mL 1   Nutritional Supplements (NUTREN 1.5) LIQD 2 cartons Nutren 1.5 (500 ml) QID via tube. Flush with 60 ml water  before/after each bolus. Provide additional 250 ml water  flush 4x/day in between feedings to meet hydration needs. Provides 3000 kcal, 136 g protein, 1528 ml free water  (3008 ml total water ) 2000 ml/day meets 100% DRI     solifenacin (VESICARE) 5 MG tablet Take 5 mg by mouth daily.     acetaminophen  (TYLENOL ) 500 MG tablet Take 1 tablet (500 mg total) by mouth every 6 (six) hours. Please take every 6 hrs and stagger with Motrin  3 hrs apart from each other. Please do not exceed maximum daily dose to avoid liver damage (Patient not taking: Reported on 07/22/2023) 30 tablet 1   ALPRAZolam  (XANAX ) 1 MG tablet Take 0.5 tablets (0.5 mg total) by mouth 2 (two) times daily as needed for anxiety. (Patient not taking: Reported on 08/13/2023) 60 tablet 0   aspirin EC 81 MG tablet Take 81 mg by mouth daily. Swallow whole. (Patient not taking: Reported on 08/13/2023)     atorvastatin  (LIPITOR) 20 MG tablet Take 1 tablet (20 mg total) by mouth daily. (Patient not taking: Reported on 08/13/2023) 90 tablet 2   baclofen  (LIORESAL ) 10 MG tablet Take 0.5-1 tablets (5-10 mg total) by mouth at bedtime as needed for muscle spasms. (Patient not taking: Reported on 08/13/2023) 10 each 0   ibuprofen  (ADVIL ) 600 MG tablet Take 1 tablet (600 mg total) by mouth every 6 (six) hours. Please take every 6 hrs, and stagger this medication 3 hrs apart from Tylenol  (Patient not taking: Reported on 07/22/2023) 30 tablet 0   lisinopril -hydrochlorothiazide  (ZESTORETIC ) 20-25 MG tablet Take 1 tablet by mouth once daily. (Patient not taking: Reported on 08/13/2023) 90 tablet 2   metoCLOPramide  (REGLAN ) 10 MG tablet Take 1 tablet (10 mg total) by mouth 3  (three) times daily before meals. (Patient not taking: Reported on 08/13/2023) 30 tablet 3   ondansetron  (ZOFRAN ) 8 MG tablet Take 1 tablet (8 mg total) by mouth every 8 (eight) hours as needed for nausea or vomiting. Start on the third day after cisplatin . (Patient not taking: Reported on 08/13/2023) 30 tablet 1   ondansetron  (ZOFRAN -ODT) 8 MG disintegrating tablet Take 1 tablet (8 mg total) by mouth every 8 (eight) hours as needed for nausea or vomiting. (Patient not taking: Reported on 08/13/2023) 30 tablet 2   oxyCODONE  10 MG TABS Take 1 tablet (10 mg total) by mouth every 6 (six) hours as needed for severe pain (pain score 7-10). Use 1 tab Q4hrs for pain and if continue to have severe pain ok to take 2 tabs Q4hrs (Patient not taking: Reported on 08/13/2023) 90 tablet 0   prochlorperazine  (COMPAZINE ) 10 MG tablet Take 1 tablet (  10 mg total) by mouth every 6 (six) hours as needed (Nausea or vomiting). (Patient not taking: Reported on 07/09/2023) 30 tablet 1   sucralfate  (CARAFATE ) 1 g tablet Take 1 tablet (1 g total) by mouth 4 (four) times daily. Take with meals and at bedtime. Place tablet in 10-15mL of warm water  to dissolve then swish and spit solution. Separate from esomeprazole . (Patient not taking: Reported on 08/13/2023) 120 tablet 0   tamsulosin  (FLOMAX ) 0.4 MG CAPS capsule Take 0.4 mg by mouth daily after supper. (Patient not taking: Reported on 08/13/2023)     No current facility-administered medications for this visit.     VITALS:   Blood pressure (!) 94/57, pulse (!) 105, temperature 98.3 F (36.8 C), temperature source Temporal, resp. rate 18, weight 263 lb 11.2 oz (119.6 kg), SpO2 97%.  Wt Readings from Last 3 Encounters:  08/13/23 263 lb 11.2 oz (119.6 kg)  08/09/23 266 lb 1.6 oz (120.7 kg)  08/05/23 266 lb (120.7 kg)    Body mass index is 34.79 kg/m.    Onc Performance Status - 08/13/23 0904       ECOG Perf Status   ECOG Perf Status Capable of only limited selfcare, confined  to bed or chair more than 50% of waking hours      KPS SCALE   KPS % SCORE Requires considerable assistance, and frequent medical care                PHYSICAL EXAM:   Physical Exam Constitutional:      General: He is not in acute distress.    Comments: Appears tired.  Presented to clinic in a wheelchair today  HENT:     Head: Normocephalic and atraumatic.     Mouth/Throat:     Comments: Right oropharynx tonsil tumor eroding through soft palate, tumor reaches uvula, uvula swollen Eyes:     General: No scleral icterus.    Conjunctiva/sclera: Conjunctivae normal.  Cardiovascular:     Rate and Rhythm: Normal rate and regular rhythm.     Heart sounds: Normal heart sounds.  Pulmonary:     Effort: Pulmonary effort is normal.     Breath sounds: Normal breath sounds.  Chest:     Comments: Right sided Port-A-Cath on place without any signs of infection Abdominal:     General: There is no distension.     Comments: Feeding tube in place  Musculoskeletal:     Right lower leg: No edema.     Left lower leg: No edema.  Neurological:     General: No focal deficit present.     Mental Status: He is alert and oriented to person, place, and time.      LABORATORY DATA:   I have reviewed the data as listed.  Results for orders placed or performed in visit on 08/13/23  Magnesium   Result Value Ref Range   Magnesium  1.2 (L) 1.7 - 2.4 mg/dL  Basic Metabolic Panel - Cancer Center Only  Result Value Ref Range   Sodium 131 (L) 135 - 145 mmol/L   Potassium 4.0 3.5 - 5.1 mmol/L   Chloride 93 (L) 98 - 111 mmol/L   CO2 31 22 - 32 mmol/L   Glucose, Bld 142 (H) 70 - 99 mg/dL   BUN 17 8 - 23 mg/dL   Creatinine 4.09 (L) 8.11 - 1.24 mg/dL   Calcium  7.9 (L) 8.9 - 10.3 mg/dL   GFR, Estimated >91 >47 mL/min   Anion gap 7 5 -  15  CBC with Differential (Cancer Center Only)  Result Value Ref Range   WBC Count 0.7 (LL) 4.0 - 10.5 K/uL   RBC 3.55 (L) 4.22 - 5.81 MIL/uL   Hemoglobin 11.0 (L)  13.0 - 17.0 g/dL   HCT 62.1 (L) 30.8 - 65.7 %   MCV 89.3 80.0 - 100.0 fL   MCH 31.0 26.0 - 34.0 pg   MCHC 34.7 30.0 - 36.0 g/dL   RDW 84.6 96.2 - 95.2 %   Platelet Count 78 (L) 150 - 400 K/uL   nRBC 0.0 0.0 - 0.2 %   Neutrophils Relative % 67 %   Neutro Abs 0.5 (L) 1.7 - 7.7 K/uL   Lymphocytes Relative 17 %   Lymphs Abs 0.1 (L) 0.7 - 4.0 K/uL   Monocytes Relative 14 %   Monocytes Absolute 0.1 0.1 - 1.0 K/uL   Eosinophils Relative 0 %   Eosinophils Absolute 0.0 0.0 - 0.5 K/uL   Basophils Relative 1 %   Basophils Absolute 0.0 0.0 - 0.1 K/uL   WBC Morphology Mild Left Shift (1-5% metas, occ myelo)    Smear Review Normal platelet morphology    Immature Granulocytes 1 %   Abs Immature Granulocytes 0.01 0.00 - 0.07 K/uL   Polychromasia PRESENT    Ovalocytes PRESENT   Results for orders placed or performed in visit on 08/13/23  Rad Onc Aria Session Summary  Result Value Ref Range   Course ID [id number]    Course Start Date 07/05/2023    Session Number 25    Course First Treatment Date 07/12/2023  8:47 AM    Course Last Treatment Date 08/13/2023  8:02 AM    Course Elapsed Days 32    Reference Point ID HN [id number]    Reference Point Dosage Given to Date 50.00000025 Gy   Reference Point Session Dosage Given 2.00000001 Gy   Plan ID [id number]    Plan Fractions Treated to Date 25    Plan Total Fractions Prescribed 35    Plan Prescribed Dose Per Fraction 2 Gy   Plan Total Prescribed Dose 70.000000 Gy   Plan Primary Reference Point HN dp     RADIOGRAPHIC STUDIES:  I have personally reviewed the radiological images as listed and agree with the findings in the report.  No results found.   CODE STATUS:  Code Status History     Date Active Date Inactive Code Status Order ID Comments User Context   06/18/2023 1400 06/19/2023 0508 Full Code [id number]  Elene Griffes, MD HOV    Questions for Most Recent Historical Code Status (Order [id number])     Question Answer   By: Consent: discussion  documented in EHR            No orders of the defined types were placed in this encounter.    Future Appointments  Date Time Provider Department Center  08/16/2023  8:30 AM [facility] LINAC 4 [facility] None  08/16/2023  8:45 AM LINAC-SQUIRE [facility] None  08/16/2023  9:00 AM [facility] INFUSION [facility] None  08/17/2023  8:30 AM [facility] LINAC 4 [facility] None  08/17/2023  9:15 AM [facility] FLUSH [facility] None  08/18/2023  8:30 AM [facility] LINAC 4 [facility] None  08/18/2023  9:00 AM [facility] INFUSION [facility] None  08/19/2023  8:30 AM [facility] [id number] [facility] None  08/19/2023  8:45 AM Schinke, Carl B, CCC-SLP [facility]  08/20/2023  7:45 AM [facility] LINAC 4 [facility] None  08/20/2023  8:00 AM [facility] FLUSH [facility]  None  08/20/2023  8:30 AM Jazzalynn Rhudy, MD [facility] None  08/20/2023  9:30 AM [facility] INFUSION [facility] None  08/20/2023 10:30 AM Woody Heading, RD [facility] None  08/20/2023 11:00 AM Maudie Sorrow, LCSW [facility] None  08/23/2023  8:30 AM [facility] LINAC 4 [facility] None  08/24/2023  8:30 AM [facility] LINAC 4 [facility] None  08/25/2023  8:30 AM [facility] LINAC 4 [facility] None  08/26/2023  8:30 AM [facility] LINAC 4 [facility] None  08/27/2023  8:30 AM [facility] LINAC 4 [facility] None  09/16/2023  9:00 AM Breedlove Blue, Blaire L, PT [facility] None     This document was completed utilizing speech recognition software. Grammatical errors, random word insertions, pronoun errors, and incomplete sentences are an occasional consequence of this system due to software limitations, ambient noise, and hardware issues. Any formal questions or concerns about the content, text or information contained within the body of this dictation should be directly addressed to the provider for clarification.

## 2023-08-13 NOTE — Assessment & Plan Note (Addendum)
 Please review oncology history for additional details and timeline of events.  Symptoms include severe pain and dysphagia since mid-November, progressively worsening.   cT3,cN2,cM0,p16+ tumor, Stage II disease.   His case was discussed in tumor conference on 06/09/2023.  Given the extent of disease, consensus opinion is to proceed with concurrent chemoradiation. We have discussed about role of cisplatin  being a radiosensitizer in the treatment of head and neck cancer.  We have discussed about the curative intent of chemoradiation for this patient.  Patient was willing to proceed with weekly cisplatin  after discussing risk versus benefits and side effect profile.  He started cycle 1 of cisplatin  at a dose of 40 mg/m on 07/09/2023.  Plan was to continue cisplatin  weekly during the course of radiation.  He began radiation treatments from 07/12/2023.  During week 2, chemotherapy had to be held because of severe fatigue with ECOG performance status of 3.  Ongoing treatment with chemotherapy and radiation. Radiation therapy is the primary treatment and will continue as planned. He is nearing the end of the treatment course, with a couple of weeks remaining. He expressed uncertainty about continuing treatment but was encouraged to proceed given the proximity to completion.  He was due for cycle 5 of chemotherapy today.  However he developed neutropenia with ANC of 500.  We are holding chemotherapy this week.  Zarxio support next week for 3 days will be planned, pending authorization.  Will continue with dose reduced chemotherapy to 30 mg/m, upon resumption.  - Ensure home hydration (80-90 oz non-caffeinated fluids daily).  Patient's wife was encouraged to start using feeding tube as suggested by the nutritionist and also to use feeding tube for hydration.  - Monitor and replace electrolytes as needed  RTC in 1 week for labs, follow-up and possible resumption of chemotherapy.

## 2023-08-13 NOTE — Assessment & Plan Note (Signed)
 Severe neutropenia with white blood cell count dropping to 700 from 2600 last week, likely due to chemotherapy. Discussed the need to monitor for signs of infection and maintain a safe environment to prevent infections. Antibiotic prescribed for prophylaxis. - Hold chemotherapy treatment. - Request authorization for Zarxio injection, to be given on Monday, Tuesday, and Wednesday next week during fluid and radiation appointments. - Prescribed ciprofloxacin  500 mg twice daily, liquid formulation for infection prevention. - Monitor for fever and signs of infection.

## 2023-08-13 NOTE — Telephone Encounter (Signed)
 I called Isaac Carpenter to inform him of his upcoming injection appointments. Mike's VM box is full.

## 2023-08-13 NOTE — Patient Instructions (Signed)

## 2023-08-13 NOTE — Progress Notes (Signed)
 Entered in error

## 2023-08-13 NOTE — Progress Notes (Signed)
 CRITICAL VALUE STICKER  CRITICAL VALUE: WBC:  0.7 HGB 11 RECEIVER (on-site recipient of call): Elvia Hammans, RN  DATE & TIME NOTIFIED: 08/13/2023 [phone]  MESSENGER (representative from lab):Shelagh Derrick, Lab  MD NOTIFIED:  Dr. Randye Buttner   TIME OF NOTIFICATION:0918  RESPONSE:  No tx today but to get fluids instead.

## 2023-08-13 NOTE — Progress Notes (Signed)
 Patient seen by Dr. Gale Jude Pasam today  Vitals are within treatment parameters: Yes Labs are within treatment parameters: No (Please specify and give further instructions.)   WBC 0.7 Treatment plan has been signed:   Per physician team, Patient will not be receiving treatment today.   To get fluids instead.

## 2023-08-13 NOTE — Progress Notes (Signed)
 [facility] CSW Progress Note  Clinical Social Worker  attempted to met with patient as scheduled during infusion . At time of visit patient was alone and sleep. CSW attempted to wake patient twice to make contact. CSW will follow up with patient and spouse by telephone. Appointment will be rescheduled.   Maudie Sorrow, LCSW Clinical Social Worker [facility]

## 2023-08-14 ENCOUNTER — Emergency Department (HOSPITAL_COMMUNITY)

## 2023-08-14 ENCOUNTER — Other Ambulatory Visit: Payer: Self-pay

## 2023-08-14 ENCOUNTER — Encounter (HOSPITAL_COMMUNITY): Payer: Self-pay

## 2023-08-14 ENCOUNTER — Inpatient Hospital Stay (HOSPITAL_COMMUNITY)
Admission: EM | Admit: 2023-08-14 | Discharge: 2023-08-17 | DRG: 809 | Disposition: A | Discharge status: Home / Self Care | Attending: Family Medicine | Admitting: Family Medicine

## 2023-08-14 DIAGNOSIS — F17220 Nicotine dependence, chewing tobacco, uncomplicated: Secondary | ICD-10-CM | POA: Diagnosis not present

## 2023-08-14 DIAGNOSIS — E871 Hypo-osmolality and hyponatremia: Secondary | ICD-10-CM | POA: Diagnosis not present

## 2023-08-14 DIAGNOSIS — D649 Anemia, unspecified: Secondary | ICD-10-CM

## 2023-08-14 DIAGNOSIS — Z6834 Body mass index (BMI) 34.0-34.9, adult: Secondary | ICD-10-CM | POA: Diagnosis not present

## 2023-08-14 DIAGNOSIS — Y842 Radiological procedure and radiotherapy as the cause of abnormal reaction of the patient, or of later complication, without mention of misadventure at the time of the procedure: Secondary | ICD-10-CM | POA: Diagnosis present

## 2023-08-14 DIAGNOSIS — C801 Malignant (primary) neoplasm, unspecified: Secondary | ICD-10-CM | POA: Insufficient documentation

## 2023-08-14 DIAGNOSIS — D509 Iron deficiency anemia, unspecified: Secondary | ICD-10-CM | POA: Diagnosis not present

## 2023-08-14 DIAGNOSIS — Z888 Allergy status to other drugs, medicaments and biological substances status: Secondary | ICD-10-CM

## 2023-08-14 DIAGNOSIS — D696 Thrombocytopenia, unspecified: Secondary | ICD-10-CM | POA: Diagnosis present

## 2023-08-14 DIAGNOSIS — E669 Obesity, unspecified: Secondary | ICD-10-CM | POA: Diagnosis present

## 2023-08-14 DIAGNOSIS — Z8546 Personal history of malignant neoplasm of prostate: Secondary | ICD-10-CM | POA: Diagnosis not present

## 2023-08-14 DIAGNOSIS — Z931 Gastrostomy status: Secondary | ICD-10-CM | POA: Diagnosis not present

## 2023-08-14 DIAGNOSIS — K1233 Oral mucositis (ulcerative) due to radiation: Secondary | ICD-10-CM | POA: Diagnosis not present

## 2023-08-14 DIAGNOSIS — R509 Fever, unspecified: Secondary | ICD-10-CM | POA: Diagnosis present

## 2023-08-14 DIAGNOSIS — N401 Enlarged prostate with lower urinary tract symptoms: Secondary | ICD-10-CM | POA: Diagnosis present

## 2023-08-14 DIAGNOSIS — C61 Malignant neoplasm of prostate: Secondary | ICD-10-CM | POA: Diagnosis present

## 2023-08-14 DIAGNOSIS — T451X5A Adverse effect of antineoplastic and immunosuppressive drugs, initial encounter: Secondary | ICD-10-CM | POA: Diagnosis present

## 2023-08-14 DIAGNOSIS — C090 Malignant neoplasm of tonsillar fossa: Secondary | ICD-10-CM | POA: Diagnosis present

## 2023-08-14 DIAGNOSIS — Z79890 Hormone replacement therapy (postmenopausal): Secondary | ICD-10-CM

## 2023-08-14 DIAGNOSIS — I10 Essential (primary) hypertension: Secondary | ICD-10-CM | POA: Diagnosis not present

## 2023-08-14 DIAGNOSIS — E785 Hyperlipidemia, unspecified: Secondary | ICD-10-CM | POA: Diagnosis present

## 2023-08-14 DIAGNOSIS — D849 Immunodeficiency, unspecified: Secondary | ICD-10-CM | POA: Diagnosis present

## 2023-08-14 DIAGNOSIS — D709 Neutropenia, unspecified: Secondary | ICD-10-CM | POA: Diagnosis not present

## 2023-08-14 DIAGNOSIS — E890 Postprocedural hypothyroidism: Secondary | ICD-10-CM | POA: Diagnosis present

## 2023-08-14 DIAGNOSIS — A419 Sepsis, unspecified organism: Principal | ICD-10-CM

## 2023-08-14 DIAGNOSIS — G4733 Obstructive sleep apnea (adult) (pediatric): Secondary | ICD-10-CM | POA: Diagnosis present

## 2023-08-14 DIAGNOSIS — Z0389 Encounter for observation for other suspected diseases and conditions ruled out: Secondary | ICD-10-CM | POA: Diagnosis not present

## 2023-08-14 DIAGNOSIS — Z7689 Persons encountering health services in other specified circumstances: Secondary | ICD-10-CM

## 2023-08-14 DIAGNOSIS — E1169 Type 2 diabetes mellitus with other specified complication: Secondary | ICD-10-CM | POA: Diagnosis not present

## 2023-08-14 DIAGNOSIS — K1231 Oral mucositis (ulcerative) due to antineoplastic therapy: Secondary | ICD-10-CM | POA: Diagnosis present

## 2023-08-14 DIAGNOSIS — R300 Dysuria: Secondary | ICD-10-CM | POA: Diagnosis not present

## 2023-08-14 DIAGNOSIS — Z9109 Other allergy status, other than to drugs and biological substances: Secondary | ICD-10-CM

## 2023-08-14 DIAGNOSIS — R5081 Fever presenting with conditions classified elsewhere: Secondary | ICD-10-CM | POA: Diagnosis present

## 2023-08-14 DIAGNOSIS — R351 Nocturia: Secondary | ICD-10-CM | POA: Diagnosis present

## 2023-08-14 DIAGNOSIS — Z7952 Long term (current) use of systemic steroids: Secondary | ICD-10-CM

## 2023-08-14 DIAGNOSIS — D701 Agranulocytosis secondary to cancer chemotherapy: Secondary | ICD-10-CM

## 2023-08-14 DIAGNOSIS — R000 Tachycardia, unspecified: Secondary | ICD-10-CM | POA: Diagnosis not present

## 2023-08-14 DIAGNOSIS — M1990 Unspecified osteoarthritis, unspecified site: Secondary | ICD-10-CM | POA: Diagnosis present

## 2023-08-14 DIAGNOSIS — Z79899 Other long term (current) drug therapy: Secondary | ICD-10-CM

## 2023-08-14 DIAGNOSIS — Z8249 Family history of ischemic heart disease and other diseases of the circulatory system: Secondary | ICD-10-CM

## 2023-08-14 DIAGNOSIS — R682 Dry mouth, unspecified: Secondary | ICD-10-CM | POA: Diagnosis present

## 2023-08-14 LAB — URINALYSIS, W/ REFLEX TO CULTURE (INFECTION SUSPECTED)
Bacteria, UA: NONE SEEN
Bilirubin Urine: NEGATIVE
Glucose, UA: 150 mg/dL — AB
Hgb urine dipstick: NEGATIVE
Ketones, ur: NEGATIVE mg/dL
Leukocytes,Ua: NEGATIVE
Nitrite: NEGATIVE
Protein, ur: 30 mg/dL — AB
Specific Gravity, Urine: 1.023 (ref 1.005–1.030)
pH: 5 (ref 5.0–8.0)

## 2023-08-14 LAB — RESP PANEL BY RT-PCR (RSV, FLU A&B, COVID)  RVPGX2
Influenza A by PCR: NEGATIVE
Influenza B by PCR: NEGATIVE
Resp Syncytial Virus by PCR: NEGATIVE
SARS Coronavirus 2 by RT PCR: NEGATIVE

## 2023-08-14 LAB — CBC WITH DIFFERENTIAL/PLATELET
Abs Immature Granulocytes: 0.01 10*3/uL (ref 0.00–0.07)
Basophils Absolute: 0 10*3/uL (ref 0.0–0.1)
Basophils Relative: 0 %
Eosinophils Absolute: 0 10*3/uL (ref 0.0–0.5)
Eosinophils Relative: 1 %
HCT: 29.8 % — ABNORMAL LOW (ref 39.0–52.0)
Hemoglobin: 9.9 g/dL — ABNORMAL LOW (ref 13.0–17.0)
Immature Granulocytes: 1 %
Lymphocytes Relative: 26 %
Lymphs Abs: 0.2 10*3/uL — ABNORMAL LOW (ref 0.7–4.0)
MCH: 31.1 pg (ref 26.0–34.0)
MCHC: 33.2 g/dL (ref 30.0–36.0)
MCV: 93.7 fL (ref 80.0–100.0)
Monocytes Absolute: 0.2 10*3/uL (ref 0.1–1.0)
Monocytes Relative: 21 %
Neutro Abs: 0.4 10*3/uL — CL (ref 1.7–7.7)
Neutrophils Relative %: 51 %
Platelets: 77 10*3/uL — ABNORMAL LOW (ref 150–400)
RBC: 3.18 MIL/uL — ABNORMAL LOW (ref 4.22–5.81)
RDW: 14.7 % (ref 11.5–15.5)
WBC: 0.9 10*3/uL — CL (ref 4.0–10.5)
nRBC: 0 % (ref 0.0–0.2)

## 2023-08-14 LAB — IRON AND TIBC
Iron: 29 ug/dL — ABNORMAL LOW (ref 45–182)
Saturation Ratios: 24 % (ref 17.9–39.5)
TIBC: 123 ug/dL — ABNORMAL LOW (ref 250–450)
UIBC: 94 ug/dL

## 2023-08-14 LAB — COMPREHENSIVE METABOLIC PANEL WITH GFR
ALT: 34 U/L (ref 0–44)
AST: 34 U/L (ref 15–41)
Albumin: 2.4 g/dL — ABNORMAL LOW (ref 3.5–5.0)
Alkaline Phosphatase: 86 U/L (ref 38–126)
Anion gap: 9 (ref 5–15)
BUN: 20 mg/dL (ref 8–23)
CO2: 26 mmol/L (ref 22–32)
Calcium: 7.5 mg/dL — ABNORMAL LOW (ref 8.9–10.3)
Chloride: 94 mmol/L — ABNORMAL LOW (ref 98–111)
Creatinine, Ser: 0.62 mg/dL (ref 0.61–1.24)
GFR, Estimated: >60 mL/min (ref >60)
Glucose, Bld: 187 mg/dL — ABNORMAL HIGH (ref 70–99)
Potassium: 3.7 mmol/L (ref 3.5–5.1)
Sodium: 129 mmol/L — ABNORMAL LOW (ref 135–145)
Total Bilirubin: 1.3 mg/dL — ABNORMAL HIGH (ref 0.0–1.2)
Total Protein: 5.2 g/dL — ABNORMAL LOW (ref 6.5–8.1)

## 2023-08-14 LAB — VITAMIN B12: Vitamin B-12: 501 pg/mL (ref 180–914)

## 2023-08-14 LAB — PROTIME-INR
INR: 1.1 (ref 0.8–1.2)
Prothrombin Time: 14.6 s (ref 11.4–15.2)

## 2023-08-14 LAB — FERRITIN: Ferritin: 1049 ng/mL — ABNORMAL HIGH (ref 24–336)

## 2023-08-14 LAB — GLUCOSE, CAPILLARY
Glucose-Capillary: 127 mg/dL — ABNORMAL HIGH (ref 70–99)
Glucose-Capillary: 125 mg/dL — ABNORMAL HIGH (ref 70–99)

## 2023-08-14 LAB — I-STAT CG4 LACTIC ACID, ED: Lactic Acid, Venous: 1.6 mmol/L (ref 0.5–1.9)

## 2023-08-14 MED ORDER — OSMOLITE 1.5 CAL PO LIQD
500.0000 mL | Freq: Four times a day (QID) | ORAL | Status: DC
  Administered 2023-08-14 – 2023-08-15 (×4): 500 mL
  Administered 2023-08-15: 474 mL
  Administered 2023-08-16: 500 mL
  Filled 2023-08-14 (×7): qty 711

## 2023-08-14 MED ORDER — SODIUM CHLORIDE 0.9 % IV SOLN
2.0000 g | Freq: Three times a day (TID) | INTRAVENOUS | Status: DC
  Administered 2023-08-14 – 2023-08-17 (×9): 2 g via INTRAVENOUS
  Filled 2023-08-14 (×10): qty 12.5

## 2023-08-14 MED ORDER — ALPRAZOLAM 0.5 MG PO TABS
0.5000 mg | ORAL_TABLET | Freq: Two times a day (BID) | ORAL | Status: DC | PRN
  Administered 2023-08-15: 0.5 mg
  Filled 2023-08-14: qty 1

## 2023-08-14 MED ORDER — ONDANSETRON HCL 4 MG/2ML IJ SOLN: 4.0000 mg | Freq: Four times a day (QID) | INTRAMUSCULAR | Status: DC | PRN

## 2023-08-14 MED ORDER — LEVOTHYROXINE SODIUM 88 MCG PO TABS
88.0000 ug | ORAL_TABLET | Freq: Every day | ORAL | Status: DC
  Administered 2023-08-15 – 2023-08-17 (×3): 88 ug
  Filled 2023-08-14 (×3): qty 1

## 2023-08-14 MED ORDER — SODIUM CHLORIDE 0.9 % IV BOLUS
1000.0000 mL | Freq: Once | INTRAVENOUS | Status: AC
  Administered 2023-08-14: 1000 mL via INTRAVENOUS

## 2023-08-14 MED ORDER — INSULIN ASPART 100 UNIT/ML IJ SOLN
0.0000 [IU] | Freq: Three times a day (TID) | INTRAMUSCULAR | Status: DC
  Administered 2023-08-14: 1 [IU] via SUBCUTANEOUS
  Administered 2023-08-15 (×2): 2 [IU] via SUBCUTANEOUS
  Administered 2023-08-16: 1 [IU] via SUBCUTANEOUS
  Administered 2023-08-16: 2 [IU] via SUBCUTANEOUS
  Administered 2023-08-16: 1 [IU] via SUBCUTANEOUS
  Administered 2023-08-17 (×2): 2 [IU] via SUBCUTANEOUS
  Filled 2023-08-14: qty 0.09

## 2023-08-14 MED ORDER — ACETAMINOPHEN 160 MG/5ML PO SOLN
650.0000 mg | Freq: Once | ORAL | Status: AC
  Administered 2023-08-14: 650 mg via ORAL
  Filled 2023-08-14: qty 20.3

## 2023-08-14 MED ORDER — METOCLOPRAMIDE HCL 10 MG/10ML PO SOLN
10.0000 mg | Freq: Three times a day (TID) | ORAL | Status: DC
  Administered 2023-08-15 – 2023-08-17 (×8): 10 mg
  Filled 2023-08-14 (×8): qty 10

## 2023-08-14 MED ORDER — PIPERACILLIN-TAZOBACTAM 3.375 G IVPB 30 MIN: 3.3750 g | Freq: Once | INTRAVENOUS | Status: DC

## 2023-08-14 MED ORDER — HYDROCODONE-ACETAMINOPHEN 7.5-325 MG/15ML PO SOLN
10.0000 mL | Freq: Four times a day (QID) | ORAL | Status: DC | PRN
  Administered 2023-08-14 – 2023-08-15 (×2): 10 mL
  Filled 2023-08-14 (×2): qty 15

## 2023-08-14 MED ORDER — PANTOPRAZOLE SODIUM 40 MG IV SOLR
40.0000 mg | INTRAVENOUS | Status: DC
Start: 2023-08-14 — End: 2023-08-17
  Administered 2023-08-15 – 2023-08-16 (×3): 40 mg via INTRAVENOUS
  Filled 2023-08-14 (×3): qty 10

## 2023-08-14 MED ORDER — DOXAZOSIN MESYLATE 2 MG PO TABS
1.0000 mg | ORAL_TABLET | Freq: Every day | ORAL | Status: DC
  Administered 2023-08-14 – 2023-08-16 (×3): 1 mg
  Filled 2023-08-14 (×3): qty 1

## 2023-08-14 MED ORDER — METOPROLOL TARTRATE 5 MG/5ML IV SOLN: 2.5000 mg | Freq: Four times a day (QID) | INTRAVENOUS | Status: DC | PRN

## 2023-08-14 MED ORDER — ACETAMINOPHEN 160 MG/5ML PO SOLN
650.0000 mg | Freq: Four times a day (QID) | ORAL | Status: DC | PRN
  Administered 2023-08-17: 650 mg
  Filled 2023-08-14: qty 20.3

## 2023-08-14 MED ORDER — FLUCONAZOLE 40 MG/ML PO SUSR
100.0000 mg | Freq: Every day | ORAL | Status: DC
Start: 2023-08-14 — End: 2023-08-17
  Administered 2023-08-14 – 2023-08-16 (×3): 100 mg
  Filled 2023-08-14 (×5): qty 2.5

## 2023-08-14 MED ORDER — CHLORHEXIDINE GLUCONATE CLOTH 2 % EX PADS
6.0000 | MEDICATED_PAD | Freq: Every day | CUTANEOUS | Status: DC
  Administered 2023-08-14 – 2023-08-17 (×4): 6 via TOPICAL

## 2023-08-14 MED ORDER — ONDANSETRON HCL 4 MG PO TABS: 4.0000 mg | ORAL_TABLET | Freq: Four times a day (QID) | ORAL | Status: DC | PRN

## 2023-08-14 MED ORDER — VANCOMYCIN HCL 2000 MG/400ML IV SOLN
2000.0000 mg | Freq: Once | INTRAVENOUS | Status: AC
  Administered 2023-08-14: 2000 mg via INTRAVENOUS
  Filled 2023-08-14: qty 400

## 2023-08-14 MED ORDER — LIDOCAINE VISCOUS HCL 2 % MT SOLN
15.0000 mL | OROMUCOSAL | Status: DC | PRN
Start: 2023-08-14 — End: 2023-08-17
  Administered 2023-08-14 – 2023-08-15 (×4): 15 mL via OROMUCOSAL
  Filled 2023-08-14 (×6): qty 15

## 2023-08-14 MED ORDER — SODIUM CHLORIDE 0.9% FLUSH: 10.0000 mL | INTRAVENOUS | Status: DC | PRN

## 2023-08-14 MED ORDER — SODIUM CHLORIDE 0.9% FLUSH
10.0000 mL | Freq: Two times a day (BID) | INTRAVENOUS | Status: DC
  Administered 2023-08-15 – 2023-08-17 (×3): 10 mL

## 2023-08-14 NOTE — ED Provider Notes (Signed)
 [place] EMERGENCY DEPARTMENT AT [facility] Provider Note   CSN: [id number] Arrival date & time: 08/14/23  1317     History  Chief Complaint  Patient presents with   Fever    Isaac Carpenter is a 73 y.o. male presenting Emergency Department with fever and fatigue ongoing for 24 hours.  Patient has a history of tonsillar cancer and receives chemotherapy through a chest port, although he has not had a session and 1-1/2 weeks.  He has a G-tube as well and typically does not take anything by mouth.  He has been having subjective fevers and fatigue for 24 hours.  He denies coughing, nausea, vomiting, diarrhea, abdominal pain.  He is here with his wife at the bedside.  She reports his temperature was 101 yesterday last night.  He was prescribed liquid ciprofloxacin  and has taken 2 doses of this medication.  They were told that he was "neutropenic" on blood testing yesterday.  HPI     Home Medications Prior to Admission medications   Medication Sig Start Date End Date Taking? Authorizing Provider  acetaminophen  (TYLENOL ) 500 MG tablet Take 1 tablet (500 mg total) by mouth every 6 (six) hours. Please take every 6 hrs and stagger with Motrin  3 hrs apart from each other. Please do not exceed maximum daily dose to avoid liver damage Patient not taking: Reported on 07/22/2023 05/27/23   Soldatova, Liuba, MD  ALPRAZolam  (XANAX ) 1 MG tablet Take 0.5 tablets (0.5 mg total) by mouth 2 (two) times daily as needed for anxiety. Patient not taking: Reported on 08/13/2023 07/09/23   Arlo Berber, MD  aspirin EC 81 MG tablet Take 81 mg by mouth daily. Swallow whole. Patient not taking: Reported on 08/13/2023    [provider]  atorvastatin  (LIPITOR) 20 MG tablet Take 1 tablet (20 mg total) by mouth daily. Patient not taking: Reported on 08/13/2023 08/02/23   [name], [name], MD  baclofen  (LIORESAL ) 10 MG tablet Take 0.5-1 tablets (5-10 mg total) by mouth at bedtime as needed for muscle  spasms. Patient not taking: Reported on 08/13/2023 06/21/23   Burton, Lacie K, NP  ciprofloxacin  (CIPRO ) 500 MG/5ML (10%) suspension Take 5 mLs (500 mg total) by mouth 2 (two) times daily. Do NOT administer via tube 08/13/23   Pasam, Avinash, MD  dexamethasone  (DEXAMETHASONE  INTENSOL) 1 MG/ML solution TAKE BY MOUTH OR G-TUBE ONCE DAILY FOR THREE DAYS AFTER CHEMO 08/11/23   Pasam, Avinash, MD  doxazosin  (CARDURA ) 1 MG tablet Take 1 tablet (1 mg total) by mouth at bedtime. 07/09/23   Pasam, Gale Jude, MD  esomeprazole  (NEXIUM ) 40 MG capsule Take 1 capsule (40 mg total) by mouth daily at 12 noon. See instructions to administer through Tube. 08/05/23   Thayil, Irene T, PA-C  fluconazole  (DIFLUCAN ) 40 MG/ML suspension Take 5mL today, then 2.5mL daily for 20 more days. HOLD ATORVASTATIN  WHILE ON THIS. 08/02/23   Colie Dawes, MD  HYDROcodone -acetaminophen  (HYCET) 7.5-325 mg/15 ml solution Take 10 mLs by mouth 4 (four) times daily as needed for moderate pain (pain score 4-6). 08/03/23   Pasam, Gale Jude, MD  ibuprofen  (ADVIL ) 600 MG tablet Take 1 tablet (600 mg total) by mouth every 6 (six) hours. Please take every 6 hrs, and stagger this medication 3 hrs apart from Tylenol  Patient not taking: Reported on 07/22/2023 05/27/23   Soldatova, Liuba, MD  levothyroxine  (SYNTHROID ) 88 MCG tablet TAKE ONE TABLET BY MOUTH DAILY. 07/12/23   [name], [name], MD  lidocaine  (XYLOCAINE ) 2 %  solution Patient: Mix 1part 2% viscous lidocaine , 1part water . Swish and spit 10mL of diluted mixture every 2 hours as needed. 08/09/23   Colie Dawes, MD  lidocaine -prilocaine  (EMLA ) cream Apply to affected area once 06/11/23   Pasam, Avinash, MD  lisinopril -hydrochlorothiazide  (ZESTORETIC ) 20-25 MG tablet Take 1 tablet by mouth once daily. Patient not taking: Reported on 08/13/2023 07/12/23   [name], [name], MD  magic mouthwash (nystatin , diphenhydrAMINE, alum & mag hydroxide) suspension mixture Take 5 mLs by mouth 3 (three) times daily as directed.  08/05/23   Pearlene Bouchard, PA-C  metoCLOPramide  (REGLAN ) 10 MG tablet Take 1 tablet (10 mg total) by mouth 3 (three) times daily before meals. Patient not taking: Reported on 08/13/2023 08/02/23   Pasam, Gale Jude, MD  metoCLOPramide  (REGLAN ) 10 MG/10ML SOLN Place 10 mLs (10 mg total) into feeding tube 3 (three) times daily before meals. 08/03/23   Pasam, Gale Jude, MD  Nutritional Supplements (NUTREN 1.5) LIQD 2 cartons Nutren 1.5 (500 ml) QID via tube. Flush with 60 ml water  before/after each bolus. Provide additional 250 ml water  flush 4x/day in between feedings to meet hydration needs. Provides 3000 kcal, 136 g protein, 1528 ml free water  (3008 ml total water ) 2000 ml/day meets 100% DRI 07/09/23   Colie Dawes, MD  ondansetron  (ZOFRAN ) 8 MG tablet Take 1 tablet (8 mg total) by mouth every 8 (eight) hours as needed for nausea or vomiting. Start on the third day after cisplatin . Patient not taking: Reported on 08/13/2023 06/11/23   Pasam, Gale Jude, MD  ondansetron  (ZOFRAN -ODT) 8 MG disintegrating tablet Take 1 tablet (8 mg total) by mouth every 8 (eight) hours as needed for nausea or vomiting. Patient not taking: Reported on 08/13/2023 08/03/23   Pasam, Gale Jude, MD  oxyCODONE  10 MG TABS Take 1 tablet (10 mg total) by mouth every 6 (six) hours as needed for severe pain (pain score 7-10). Use 1 tab Q4hrs for pain and if continue to have severe pain ok to take 2 tabs Q4hrs Patient not taking: Reported on 08/13/2023 06/10/23   Pasam, Gale Jude, MD  prochlorperazine  (COMPAZINE ) 10 MG tablet Take 1 tablet (10 mg total) by mouth every 6 (six) hours as needed (Nausea or vomiting). Patient not taking: Reported on 07/09/2023 06/11/23   Pasam, Gale Jude, MD  solifenacin (VESICARE) 5 MG tablet Take 5 mg by mouth daily. 10/12/22   [provider]  sucralfate  (CARAFATE ) 1 g tablet Take 1 tablet (1 g total) by mouth 4 (four) times daily. Take with meals and at bedtime. Place tablet in 10-15mL of warm water  to dissolve then swish  and spit solution. Separate from esomeprazole . Patient not taking: Reported on 08/13/2023 08/05/23   Thayil, Irene T, PA-C  tamsulosin  (FLOMAX ) 0.4 MG CAPS capsule Take 0.4 mg by mouth daily after supper. Patient not taking: Reported on 08/13/2023    [provider]      Allergies    Testosterone    Review of Systems   Review of Systems  Physical Exam Updated Vital Signs BP 127/64   Pulse 92   Temp 98.8 F (37.1 C) (Oral)   Resp 17   Ht 6\' 1"  (1.854 m)   Wt 120 kg   SpO2 97%   BMI 34.90 kg/m  Physical Exam Constitutional:      General: He is not in acute distress. HENT:     Head: Normocephalic and atraumatic.  Eyes:     Conjunctiva/sclera: Conjunctivae normal.     Pupils: Pupils are equal, round, and  reactive to light.  Cardiovascular:     Rate and Rhythm: Regular rhythm. Tachycardia present.  Pulmonary:     Effort: Pulmonary effort is normal. No respiratory distress.  Abdominal:     General: There is no distension.     Tenderness: There is no abdominal tenderness.     Comments: G-tube in place  Skin:    General: Skin is warm and dry.  Neurological:     General: No focal deficit present.     Mental Status: He is alert. Mental status is at baseline.  Psychiatric:        Mood and Affect: Mood normal.        Behavior: Behavior normal.     ED Results / Procedures / Treatments   Labs (all labs ordered are listed, but only abnormal results are displayed) Labs Reviewed  COMPREHENSIVE METABOLIC PANEL WITH GFR - Abnormal; Notable for the following components:      Result Value   Sodium 129 (*)    Chloride 94 (*)    Glucose, Bld 187 (*)    Calcium  7.5 (*)    Total Protein 5.2 (*)    Albumin 2.4 (*)    Total Bilirubin 1.3 (*)    All other components within normal limits  CBC WITH DIFFERENTIAL/PLATELET - Abnormal; Notable for the following components:   WBC 0.9 (*)    RBC 3.18 (*)    Hemoglobin 9.9 (*)    HCT 29.8 (*)    Platelets 77 (*)    Neutro Abs  0.4 (*)    Lymphs Abs 0.2 (*)    All other components within normal limits  RESP PANEL BY RT-PCR (RSV, FLU A&B, COVID)  RVPGX2  CULTURE, BLOOD (ROUTINE X 2)  CULTURE, BLOOD (ROUTINE X 2)  URINE CULTURE  PROTIME-INR  URINALYSIS, W/ REFLEX TO CULTURE (INFECTION SUSPECTED)  I-STAT CG4 LACTIC ACID, ED    EKG None  Radiology DG Chest Port 1 View Result Date: 08/14/2023 CLINICAL DATA:  Possible sepsis and fevers EXAM: PORTABLE CHEST 1 VIEW COMPARISON:  08/04/2018 FINDINGS: Cardiac shadow is within normal limits somewhat accentuated by the portable technique. Right chest wall port is noted. Lungs are well aerated bilaterally. No focal infiltrate or effusion is seen. No bony abnormality is noted. IMPRESSION: No active disease. Electronically Signed   By: Violeta Grey M.D.   On: 08/14/2023 14:54    Procedures .Critical Care  Performed by: Arvilla Birmingham, MD Authorized by: Arvilla Birmingham, MD   Critical care provider statement:    Critical care time (minutes):  45   Critical care time was exclusive of:  Separately billable procedures and treating other patients   Critical care was necessary to treat or prevent imminent or life-threatening deterioration of the following conditions:  Sepsis   Critical care was time spent personally by me on the following activities:  Ordering and performing treatments and interventions, ordering and review of laboratory studies, ordering and review of radiographic studies, pulse oximetry, review of old charts, examination of patient and evaluation of patient's response to treatment     Medications Ordered in ED Medications  vancomycin (VANCOREADY) IVPB 2000 mg/400 mL (2,000 mg Intravenous New Bag/Given 08/14/23 1547)  ceFEPIme (MAXIPIME) 2 g in sodium chloride  0.9 % 100 mL IVPB (has no administration in time range)  insulin aspart (novoLOG) injection 0-9 Units (has no administration in time range)  ondansetron  (ZOFRAN ) tablet 4 mg (has no administration  in time range)    Or  ondansetron  (ZOFRAN ) injection 4 mg (has no administration in time range)  metoprolol tartrate (LOPRESSOR) injection 2.5 mg (has no administration in time range)  acetaminophen  (TYLENOL ) 160 MG/5ML solution 650 mg (has no administration in time range)  sodium chloride  0.9 % bolus 1,000 mL (0 mLs Intravenous Stopped 08/14/23 1536)  acetaminophen  (TYLENOL ) 160 MG/5ML solution 650 mg (650 mg Oral Given 08/14/23 1417)  sodium chloride  0.9 % bolus 1,000 mL (1,000 mLs Intravenous New Bag/Given 08/14/23 1502)    ED Course/ Medical Decision Making/ A&P Clinical Course as of 08/14/23 1628  Sat Aug 14, 2023  1531 Admitted to hospitalist Dr Francesco Inks [MT]    Clinical Course User Index [MT] Gordon Latus Janalyn Me, MD                                 Medical Decision Making Amount and/or Complexity of Data Reviewed Labs: ordered. Radiology: ordered.  Risk OTC drugs. Prescription drug management. Decision regarding hospitalization.   This patient presents to the ED with concern for fever, chills. This involves an extensive number of treatment options, and is a complaint that carries with it a high risk of complications and morbidity.  The differential diagnosis includes sepsis vs other infection/viral URI  Co-morbidities that complicate the patient evaluation: malignancy/immunocompromised  Additional history obtained from patient's wife  External records from outside source obtained and reviewed including oncology records  I ordered and personally interpreted labs.  The pertinent results include:  neutropenia, lactate wnl  I ordered imaging studies including dg chest I independently visualized and interpreted imaging which showed no focal infiltrate I agree with the radiologist interpretation  The patient was maintained on a cardiac monitor.  I personally viewed and interpreted the cardiac monitored which showed an underlying rhythm of: sinus rhythm  I ordered medication  including IV fluids sepsis bolus per IDW, BS antibiotics; tylenol  for fever  I have reviewed the patients home medicines and have made adjustments as needed  Test Considered: doubt meningitis, acute PE   After the interventions noted above, I reevaluated the patient and found that they have: stayed the same   Dispostion:  After consideration of the diagnostic results and the patients response to treatment, I feel that the patent would benefit from medical admission         Final Clinical Impression(s) / ED Diagnoses Final diagnoses:  Sepsis, due to unspecified organism, unspecified whether acute organ dysfunction present [facility])    Rx / DC Orders ED Discharge Orders     None         Arvilla Birmingham, MD 08/14/23 [phone]

## 2023-08-14 NOTE — ED Notes (Signed)
 Unable to collect second set of cultures peripherally

## 2023-08-14 NOTE — Assessment & Plan Note (Addendum)
 Site is clean, no signs of infection Nutrition consult for continued PEG feedings  Routine PEG tube care

## 2023-08-14 NOTE — H&P (Signed)
 History and Physical    Patient: Isaac Carpenter:[id number] DOB: 18-Jun-1950 DOA: 08/14/2023 DOS: the patient was seen and examined on 08/14/2023 PCP: [name], [name], MD  Patient coming from: Home - lives with his wife. Uses walker at times for prolonged ambulation out of house.    Chief Complaint: fevers   HPI: Isaac Carpenter is a 73 y.o. male with medical history significant of HTN, HLD, hx of partial thyroidectomy with postsurgical hypothyroidism, OSA not on CPAP, hx of prostate cancer dx'd in 06/2022 s/p radio active seed implant, squamous cell CA of right tonsil, T2DM who presented to ED with complaints of fever x 1 day. He states he had a fever last night around 8pm, but doesn't recall how high it was. He states it then went away. He had another fever this morning so he came to ED due to his cancer doctor telling him if his fever was a certain number he needed to come to ED. He can't recall number.   Denies any vision changes/headaches, chest pain or palpitations, shortness of breath or cough, abdominal pain, N/V/D, dysuria or leg swelling. His mouth hurts from the mucositis that is chronic. He states he has dysuria at times if he tries to urinate sitting down, but is fine standing up. His urine does seem darker in color per his wife.    He does not smoke or drink alcohol.   ER Course:  vitals: temp: 101.2, blood pressure: 124/88, HR: 113, RR: 16, oxygen: 96% on room air Pertinent labs:  WBC: .9, hgb: 9.9, platelets, 77, sodium: 129, ANC 400 CXR: no active disease  In ED: BC ordered, given 2L IVF in ED, started on vanc and zosyn. TRH asked to admit.    Review of Systems: As mentioned in the history of present illness. All other systems reviewed and are negative. Past Medical History:  Diagnosis Date   Arthritis    Cancer (HCC)    History of partial thyroidectomy STATES OVERACTIVE THYROID -- NO ISSUES SINCE AGE 78 AND NO MEDS   Hyperlipidemia    Hypertension    Hypothyroidism     Sleep apnea    Per patient he tried CPAP but could not tolerate.   Thyroid  disease    Past Surgical History:  Procedure Laterality Date   CIRCUMCISION  01/04/2012   Procedure: CIRCUMCISION ADULT;  Surgeon: Edmund Gouge, MD;  Location: [facility];  Service: Urology;  Laterality: N/A;   COLONOSCOPY  04/08/2020   Vito Cirigliano   CYSTOSCOPY  09/04/2022   Procedure: CYSTOSCOPY;  Surgeon: Trent Frizzle, MD;  Location: [facility];  Service: Urology;;   IR GASTROSTOMY TUBE MOD SED  06/18/2023   IR IMAGING GUIDED PORT INSERTION  06/18/2023   RADIOACTIVE SEED IMPLANT N/A 09/04/2022   Procedure: RADIOACTIVE SEED IMPLANT/BRACHYTHERAPY IMPLANT;  Surgeon: Trent Frizzle, MD;  Location: [facility];  Service: Urology;  Laterality: N/A;  90 MINS   SPACE OAR INSTILLATION N/A 09/04/2022   Procedure: SPACE OAR INSTILLATION;  Surgeon: Trent Frizzle, MD;  Location: [facility];  Service: Urology;  Laterality: N/A;   THYROIDECTOMY, PARTIAL  AGE 78   OVERACTIVE THYROID    Social History:  reports that he has never smoked. His smokeless tobacco use includes chew. He reports that he does not drink alcohol and does not use drugs.  Allergies  Allergen Reactions   Other Other (See Comments)    Tape used when putting in the port - caused  welting   Testosterone Other (See Comments)    headache    Family History  Problem Relation Age of Onset   Cancer Mother    COPD Father    Heart attack Father    Cancer Sister    Cancer Brother    Cancer Sister    Cancer Brother    Colon cancer Neg Hx    Colon polyps Neg Hx    Esophageal cancer Neg Hx    Rectal cancer Neg Hx    Stomach cancer Neg Hx     Prior to Admission medications   Medication Sig Start Date End Date Taking? Authorizing Provider  acetaminophen  (TYLENOL ) 500 MG tablet Take 1 tablet (500 mg total) by mouth every 6 (six) hours. Please take every 6 hrs and stagger with  Motrin  3 hrs apart from each other. Please do not exceed maximum daily dose to avoid liver damage Patient not taking: Reported on 07/22/2023 05/27/23   Soldatova, Liuba, MD  ALPRAZolam  (XANAX ) 1 MG tablet Take 0.5 tablets (0.5 mg total) by mouth 2 (two) times daily as needed for anxiety. Patient not taking: Reported on 08/13/2023 07/09/23   Arlo Berber, MD  aspirin EC 81 MG tablet Take 81 mg by mouth daily. Swallow whole. Patient not taking: Reported on 08/13/2023    [provider]  atorvastatin  (LIPITOR) 20 MG tablet Take 1 tablet (20 mg total) by mouth daily. Patient not taking: Reported on 08/13/2023 08/02/23   [name], [name], MD  baclofen  (LIORESAL ) 10 MG tablet Take 0.5-1 tablets (5-10 mg total) by mouth at bedtime as needed for muscle spasms. Patient not taking: Reported on 08/13/2023 06/21/23   Burton, Lacie K, NP  ciprofloxacin  (CIPRO ) 500 MG/5ML (10%) suspension Take 5 mLs (500 mg total) by mouth 2 (two) times daily. Do NOT administer via tube 08/13/23   Pasam, Avinash, MD  dexamethasone  (DEXAMETHASONE  INTENSOL) 1 MG/ML solution TAKE BY MOUTH OR G-TUBE ONCE DAILY FOR THREE DAYS AFTER CHEMO 08/11/23   Pasam, Avinash, MD  doxazosin  (CARDURA ) 1 MG tablet Take 1 tablet (1 mg total) by mouth at bedtime. 07/09/23   Pasam, Gale Jude, MD  esomeprazole  (NEXIUM ) 40 MG capsule Take 1 capsule (40 mg total) by mouth daily at 12 noon. See instructions to administer through Tube. 08/05/23   Thayil, Irene T, PA-C  fluconazole  (DIFLUCAN ) 40 MG/ML suspension Take 5mL today, then 2.5mL daily for 20 more days. HOLD ATORVASTATIN  WHILE ON THIS. 08/02/23   Colie Dawes, MD  HYDROcodone -acetaminophen  (HYCET) 7.5-325 mg/15 ml solution Take 10 mLs by mouth 4 (four) times daily as needed for moderate pain (pain score 4-6). 08/03/23   Pasam, Gale Jude, MD  ibuprofen  (ADVIL ) 600 MG tablet Take 1 tablet (600 mg total) by mouth every 6 (six) hours. Please take every 6 hrs, and stagger this medication 3 hrs apart from  Tylenol  Patient not taking: Reported on 07/22/2023 05/27/23   Soldatova, Liuba, MD  levothyroxine  (SYNTHROID ) 88 MCG tablet TAKE ONE TABLET BY MOUTH DAILY. 07/12/23   [name], [name], MD  lidocaine  (XYLOCAINE ) 2 % solution Patient: Mix 1part 2% viscous lidocaine , 1part water . Swish and spit 10mL of diluted mixture every 2 hours as needed. 08/09/23   Colie Dawes, MD  lidocaine -prilocaine  (EMLA ) cream Apply to affected area once 06/11/23   Pasam, Avinash, MD  lisinopril -hydrochlorothiazide  (ZESTORETIC ) 20-25 MG tablet Take 1 tablet by mouth once daily. Patient not taking: Reported on 08/13/2023 07/12/23   [name], [name], MD  magic mouthwash (nystatin , diphenhydrAMINE, alum &  mag hydroxide) suspension mixture Take 5 mLs by mouth 3 (three) times daily as directed. 08/05/23   Pearlene Bouchard, PA-C  metoCLOPramide  (REGLAN ) 10 MG tablet Take 1 tablet (10 mg total) by mouth 3 (three) times daily before meals. Patient not taking: Reported on 08/13/2023 08/02/23   Pasam, Gale Jude, MD  metoCLOPramide  (REGLAN ) 10 MG/10ML SOLN Place 10 mLs (10 mg total) into feeding tube 3 (three) times daily before meals. 08/03/23   Pasam, Gale Jude, MD  Nutritional Supplements (NUTREN 1.5) LIQD 2 cartons Nutren 1.5 (500 ml) QID via tube. Flush with 60 ml water  before/after each bolus. Provide additional 250 ml water  flush 4x/day in between feedings to meet hydration needs. Provides 3000 kcal, 136 g protein, 1528 ml free water  (3008 ml total water ) 2000 ml/day meets 100% DRI 07/09/23   Colie Dawes, MD  ondansetron  (ZOFRAN ) 8 MG tablet Take 1 tablet (8 mg total) by mouth every 8 (eight) hours as needed for nausea or vomiting. Start on the third day after cisplatin . Patient not taking: Reported on 08/13/2023 06/11/23   Pasam, Gale Jude, MD  ondansetron  (ZOFRAN -ODT) 8 MG disintegrating tablet Take 1 tablet (8 mg total) by mouth every 8 (eight) hours as needed for nausea or vomiting. Patient not taking: Reported on 08/13/2023 08/03/23   Pasam, Gale Jude,  MD  oxyCODONE  10 MG TABS Take 1 tablet (10 mg total) by mouth every 6 (six) hours as needed for severe pain (pain score 7-10). Use 1 tab Q4hrs for pain and if continue to have severe pain ok to take 2 tabs Q4hrs Patient not taking: Reported on 08/13/2023 06/10/23   Pasam, Gale Jude, MD  prochlorperazine  (COMPAZINE ) 10 MG tablet Take 1 tablet (10 mg total) by mouth every 6 (six) hours as needed (Nausea or vomiting). Patient not taking: Reported on 07/09/2023 06/11/23   Pasam, Gale Jude, MD  solifenacin (VESICARE) 5 MG tablet Take 5 mg by mouth daily. 10/12/22   [provider]  sucralfate  (CARAFATE ) 1 g tablet Take 1 tablet (1 g total) by mouth 4 (four) times daily. Take with meals and at bedtime. Place tablet in 10-15mL of warm water  to dissolve then swish and spit solution. Separate from esomeprazole . Patient not taking: Reported on 08/13/2023 08/05/23   Thayil, Irene T, PA-C  tamsulosin  (FLOMAX ) 0.4 MG CAPS capsule Take 0.4 mg by mouth daily after supper. Patient not taking: Reported on 08/13/2023    [provider]    Physical Exam: Vitals:   08/14/23 1530 08/14/23 1549 08/14/23 1630 08/14/23 1718  BP: 127/64  118/64 (!) 148/72  Pulse: 87 92 60 92  Resp: 17  14 18   Temp:  98.8 F (37.1 C)  99 F (37.2 C)  TempSrc:  Oral  Oral  SpO2: 95% 97% 95% 99%  Weight:      Height:       General:  Appears calm and comfortable and is in NAD. Obese.  Eyes:  PERRL, EOMI, normal lids, iris ENT:  grossly normal hearing, lips & tongue,dry mucous membranes with dry oral mucosa; poor dentition Neck:  no LAD, masses or thyromegaly; no carotid bruits Cardiovascular:  RRR, no m/r/g. No LE edema.  Respiratory:   CTA bilaterally with no wheezes/rales/rhonchi.  Normal respiratory effort. Abdomen:  soft, NT, ND, NABS. PEG tube clean/no erythema  Back:   normal alignment, no CVAT Skin:  no rash or induration seen on limited exam. Port site with no erythema/drainage.  Musculoskeletal:  grossly normal  tone BUE/BLE, good ROM, no bony abnormality Lower  extremity:  No LE edema.  Limited foot exam with no ulcerations.  2+ distal pulses. Psychiatric:  grossly normal mood and affect, speech fluent and appropriate, AOx3 Neurologic:  CN 2-12 grossly intact, moves all extremities in coordinated fashion, sensation intact   Radiological Exams on Admission: Independently reviewed - see discussion in A/P where applicable  DG Chest Port 1 View Result Date: 08/14/2023 CLINICAL DATA:  Possible sepsis and fevers EXAM: PORTABLE CHEST 1 VIEW COMPARISON:  08/04/2018 FINDINGS: Cardiac shadow is within normal limits somewhat accentuated by the portable technique. Right chest wall port is noted. Lungs are well aerated bilaterally. No focal infiltrate or effusion is seen. No bony abnormality is noted. IMPRESSION: No active disease. Electronically Signed   By: Violeta Grey M.D.   On: 08/14/2023 14:54    Labs on Admission: I have personally reviewed the available labs and imaging studies at the time of the admission.  Pertinent labs:   WBC: .9,  hgb: 9.9,  platelets, 77,  sodium: 129,  ANC 400  Assessment and Plan: Principal Problem:   Febrile neutropenia (HCC) Active Problems:   Hyponatremia   Normocytic anemia   S/P percutaneous endoscopic gastrostomy (PEG) tube placement (HCC)   Malignant neoplasm of tonsillar fossa (HCC)   Malignant neoplasm of prostate (HCC)   Hypothyroidism, postsurgical   Hypertension, essential, benign   Hyperlipidemia   Type 2 diabetes mellitus with other specified complication (HCC)   BPH associated with nocturia    Assessment and Plan: * Febrile neutropenia (HCC) 73 year old male presenting to ED for fever in setting of immunocompromised state/neutropenia due to right tonsillar cancer on chemotherapy (cisplastin). Lase chemo infusion 08/06/23. Cycle 5 held yesterday due to neutropenia with ANC of 500. Today he is febrile to 101.2 with WBC of .9 and ANC of 400 -admit to  progressive -received vanc/zosyn in ED. Start cefepime  -took 2 doses of cipro  prior to coming here  -BC obtained. Urine culture not collected. Working on urinating -CXR clear, port/PEG tube clean and no other signs of infection  -urine possible source as he states it is darker and some dysuria with sitting while urinating, although this is not new. -check covid panel   -oncology consulted  -cycle 5 held yesterday. Xarxio px awaiting PA, wanting to be given Monday-follow up on oncology recs to give inpatient  -continue anti-pyretics    Hyponatremia Near baseline, received IVF in ED TSH wnl in 05/2023 Trend   Normocytic anemia Hgb downtrending from 3 weeks ago  Chemo held this week  Check iron studies  Trend   S/P percutaneous endoscopic gastrostomy (PEG) tube placement (HCC) Site is clean, no signs of infection Nutrition consult for continued PEG feedings  Routine PEG tube care   Malignant neoplasm of tonsillar fossa (HCC) Dx'd in February of 2025 Oral mucositis and has PEG tube with NPO status  He started cycle 1 of cisplatin  on 07/09/2023.  Plan was to continue cisplatin  weekly during the course of radiation.  -He began radiation treatments from 07/12/2023. -seen in onoclogy clinic yesterday for this 5th cycle of chemo but was found to be neutropenic with ANC of 500. Chemo held.  Zarxio support next week for 3 days will be planned, pending authorization.  -started on liquid cipro -unsure if he picked this up   Malignant neoplasm of prostate (HCC) Dx'd in March of 2024 S/p radio active seed implant and brachytherapy   Hypothyroidism, postsurgical TSH wnl in February of 2025 Continue synthroid  88 mcg daily  Hypertension, essential, benign Well controlled.  Per oncology note, anti HTN medications have been held due to soft blood pressures readings PRN meds for now   Hyperlipidemia Continue lipitor 20mg  daily   Type 2 diabetes mellitus with other specified complication  (HCC) A1C of 6.5 in January of 2025 Appears diet controlled  SSI and accuchecks QAC/HS   BPH associated with nocturia Continue cardura  nightly     Advance Care Planning:   Code Status: Full Code   Consults: oncology   DVT Prophylaxis: SCDs  Family Communication: updated wife over phone   Severity of Illness: The appropriate patient status for this patient is INPATIENT. Inpatient status is judged to be reasonable and necessary in order to provide the required intensity of service to ensure the patient's safety. The patient's presenting symptoms, physical exam findings, and initial radiographic and laboratory data in the context of their chronic comorbidities is felt to place them at high risk for further clinical deterioration. Furthermore, it is not anticipated that the patient will be medically stable for discharge from the hospital within 2 midnights of admission.   * I certify that at the point of admission it is my clinical judgment that the patient will require inpatient hospital care spanning beyond 2 midnights from the point of admission due to high intensity of service, high risk for further deterioration and high frequency of surveillance required.*  Author: Raymona Caldwell, MD 08/14/2023 8:58 PM  For on call review [email or web address].

## 2023-08-14 NOTE — Assessment & Plan Note (Addendum)
 Dx'd in February of 2025 Oral mucositis and has PEG tube with NPO status  He started cycle 1 of cisplatin  on 07/09/2023.  Plan was to continue cisplatin  weekly during the course of radiation.  -He began radiation treatments from 07/12/2023. -seen in onoclogy clinic yesterday for this 5th cycle of chemo but was found to be neutropenic with ANC of 500. Chemo held.  Zarxio support next week for 3 days will be planned, pending authorization.  -started on liquid cipro -unsure if he picked this up

## 2023-08-14 NOTE — Assessment & Plan Note (Signed)
 A1C of 6.5 in January of 2025 Appears diet controlled  SSI and accuchecks QAC/HS

## 2023-08-14 NOTE — Assessment & Plan Note (Signed)
 Near baseline, received IVF in ED TSH wnl in 05/2023 Trend

## 2023-08-14 NOTE — Assessment & Plan Note (Signed)
 Continue cardura  nightly

## 2023-08-14 NOTE — ED Triage Notes (Signed)
 Patient stated he feels weak with a fever. Feels very rundown. Has throat cancer and is getting chemo. Unable to talk much in triage due to feeling too sick.

## 2023-08-14 NOTE — Assessment & Plan Note (Signed)
 Hgb downtrending from 3 weeks ago  Chemo held this week  Check iron studies  Trend

## 2023-08-14 NOTE — Assessment & Plan Note (Addendum)
 73 year old male presenting to ED for fever in setting of immunocompromised state/neutropenia due to right tonsillar cancer on chemotherapy (cisplastin). Lase chemo infusion 08/06/23. Cycle 5 held yesterday due to neutropenia with ANC of 500. Today he is febrile to 101.2 with WBC of .9 and ANC of 400 -admit to progressive -received vanc/zosyn in ED. Start cefepime  -took 2 doses of cipro  prior to coming here  -BC obtained. Urine culture not collected. Working on urinating -CXR clear, port/PEG tube clean and no other signs of infection  -urine possible source as he states it is darker and some dysuria with sitting while urinating, although this is not new. -check covid panel   -oncology consulted  -cycle 5 held yesterday. Xarxio px awaiting PA, wanting to be given Monday-follow up on oncology recs to give inpatient  -continue anti-pyretics

## 2023-08-14 NOTE — Assessment & Plan Note (Addendum)
 Well controlled.  Per oncology note, anti HTN medications have been held due to soft blood pressures readings PRN meds for now

## 2023-08-14 NOTE — Assessment & Plan Note (Signed)
Continue lipitor 20mg daily  

## 2023-08-14 NOTE — Progress Notes (Signed)
 ED Pharmacy Antibiotic Sign Off An antibiotic consult was received from an ED provider for zosyn & vancomycin per pharmacy dosing for sepsis. A chart review was completed to assess appropriateness.   The following one time order(s) were placed:  Zosyn 3.375 gm & vancomycin 2 gm  Further antibiotic and/or antibiotic pharmacy consults should be ordered by the admitting provider if indicated.   Thank you for allowing pharmacy to be a part of this patient's care.   Rubie Corona, Pharm.D Use secure chat for questions 08/14/2023 3:06 PM Clinical Pharmacist 08/14/23 3:06 PM\

## 2023-08-14 NOTE — Assessment & Plan Note (Signed)
 TSH wnl in February of 2025 Continue synthroid  88 mcg daily

## 2023-08-14 NOTE — Assessment & Plan Note (Signed)
 Dx'd in March of 2024 S/p radio active seed implant and brachytherapy

## 2023-08-15 DIAGNOSIS — R5081 Fever presenting with conditions classified elsewhere: Secondary | ICD-10-CM | POA: Diagnosis not present

## 2023-08-15 DIAGNOSIS — D709 Neutropenia, unspecified: Secondary | ICD-10-CM | POA: Diagnosis not present

## 2023-08-15 LAB — CBC WITH DIFFERENTIAL/PLATELET
Abs Immature Granulocytes: 0 10*3/uL (ref 0.00–0.07)
Basophils Absolute: 0 10*3/uL (ref 0.0–0.1)
Basophils Relative: 1 %
Eosinophils Absolute: 0 10*3/uL (ref 0.0–0.5)
Eosinophils Relative: 1 %
HCT: 27.6 % — ABNORMAL LOW (ref 39.0–52.0)
Hemoglobin: 8.9 g/dL — ABNORMAL LOW (ref 13.0–17.0)
Immature Granulocytes: 0 %
Lymphocytes Relative: 24 %
Lymphs Abs: 0.2 10*3/uL — ABNORMAL LOW (ref 0.7–4.0)
MCH: 30.8 pg (ref 26.0–34.0)
MCHC: 32.2 g/dL (ref 30.0–36.0)
MCV: 95.5 fL (ref 80.0–100.0)
Monocytes Absolute: 0.1 10*3/uL (ref 0.1–1.0)
Monocytes Relative: 19 %
Neutro Abs: 0.4 10*3/uL — CL (ref 1.7–7.7)
Neutrophils Relative %: 55 %
Platelets: 67 10*3/uL — ABNORMAL LOW (ref 150–400)
RBC: 2.89 MIL/uL — ABNORMAL LOW (ref 4.22–5.81)
RDW: 14.8 % (ref 11.5–15.5)
WBC: 0.7 10*3/uL — CL (ref 4.0–10.5)
nRBC: 0 % (ref 0.0–0.2)

## 2023-08-15 LAB — BASIC METABOLIC PANEL WITH GFR
Anion gap: 7 (ref 5–15)
BUN: 17 mg/dL (ref 8–23)
CO2: 27 mmol/L (ref 22–32)
Calcium: 7.1 mg/dL — ABNORMAL LOW (ref 8.9–10.3)
Chloride: 96 mmol/L — ABNORMAL LOW (ref 98–111)
Creatinine, Ser: 0.58 mg/dL — ABNORMAL LOW (ref 0.61–1.24)
GFR, Estimated: >60 mL/min (ref >60)
Glucose, Bld: 196 mg/dL — ABNORMAL HIGH (ref 70–99)
Potassium: 3.7 mmol/L (ref 3.5–5.1)
Sodium: 130 mmol/L — ABNORMAL LOW (ref 135–145)

## 2023-08-15 LAB — GLUCOSE, CAPILLARY
Glucose-Capillary: 116 mg/dL — ABNORMAL HIGH (ref 70–99)
Glucose-Capillary: 188 mg/dL — ABNORMAL HIGH (ref 70–99)
Glucose-Capillary: 173 mg/dL — ABNORMAL HIGH (ref 70–99)
Glucose-Capillary: 201 mg/dL — ABNORMAL HIGH (ref 70–99)

## 2023-08-15 LAB — URINE CULTURE: Culture: NO GROWTH

## 2023-08-15 MED ORDER — POLYETHYLENE GLYCOL 3350 17 G PO PACK
17.0000 g | PACK | Freq: Every day | ORAL | Status: DC
  Administered 2023-08-15 – 2023-08-17 (×3): 17 g via ORAL
  Filled 2023-08-15 (×3): qty 1

## 2023-08-15 MED ORDER — MAGIC MOUTHWASH W/LIDOCAINE: 1.0000 mL | Freq: Four times a day (QID) | ORAL | Status: DC | PRN

## 2023-08-15 NOTE — Progress Notes (Signed)
 PROGRESS NOTE    Isaac Carpenter  QVZ:[id number] DOB: [birth year] DOA: 08/14/2023 PCP: [name], [name], MD    Brief Narrative:  This 73 years old male with PMH significant for hypertension, hyperlipidemia, history of partial thyroidectomy with postsurgical hypothyroidism, OSA not on CPAP, history of prostate cancer, diagnosed in 3/24 s/p radio active seed implant, squamous cell carcinoma of right tonsil, type 2 diabetes presented in the ED with complaints of fever for 1 day. Patient states fever happened last night around 8 pm but he does not recall how high it was.  Fever resolved by itself,  he had another fever episode in the morning so his oncologist advised him to come to the hospital for evaluation. Patient states he has dysuria at times His urine does seem darker in color. He was febrile on arrival in the ED.  Patient was admitted for neutropenic fever and started on empiric antibiotic in the setting of immunocompromise state.  Assessment & Plan:   Principal Problem:   Febrile neutropenia (HCC) Active Problems:   Hyponatremia   Normocytic anemia   S/P percutaneous endoscopic gastrostomy (PEG) tube placement (HCC)   Malignant neoplasm of tonsillar fossa (HCC)   Malignant neoplasm of prostate (HCC)   Hypothyroidism, postsurgical   Hypertension, essential, benign   Hyperlipidemia   Type 2 diabetes mellitus with other specified complication (HCC)   BPH associated with nocturia   Febrile neutropenia [facility]): Patient presented with fever in setting of immunocompromised state.  He has Neutropenia due to right tonsillar cancer on chemotherapy (cisplastin). Lase chemo infusion 08/06/23.  Cycle 5 held yesterday due to neutropenia with ANC of 500.  He was febrile to 101.2 with WBC of 0.9 and ANC of 400 Empirically initiated on vancomycin / Zosyn.  De-escalated to cefepime. Patient has taken 2 doses of Cipro  prior to coming to the hospital. Follow-up blood and urine culture. CXR clear,  port/PEG tube clean and no other signs of infection.  Urine possible source as he states it is darker and some dysuria with sitting while urinating, although this is not new. COVID and RSV, influenza negative. Oncology is consulted, awaiting recommendation. Cycle 5 held yesterday. Xarxio px awaiting PA, wanting to be given Monday-follow up on oncology recs to give inpatient  Continue antipyretics.   Hyponatremia: Near baseline, received IVF in ED. TSH wnl in 05/2023 Trend Serum Sodium.   Normocytic anemia Hgb downtrending from 3 weeks ago  Chemo held this week . Serum iron low.  Continue iron supplement. Trend H/H   S/P percutaneous endoscopic gastrostomy (PEG) tube placement [facility]) Site is clean, no signs of infection. Nutrition consult for continued PEG feedings . Routine PEG tube care .   Malignant neoplasm of tonsillar fossa (HCC) Dx'd in February of 2025 Oral mucositis and has PEG tube with NPO status  He started cycle 1 of cisplatin  on 07/09/2023.  Plan was to continue cisplatin  weekly during the course of radiation. He began radiation treatments from 07/12/2023. He was seen in onoclogy clinic yesterday for this 5th cycle of chemo but was found to be neutropenic with ANC of 500. Chemo held.  Zarxio support next week for 3 days will be planned, pending authorization.  He was started on liquid cipro -unsure if he picked this up .   Malignant neoplasm of prostate (HCC) Dx'd in March of 2024. S/p radio active seed implant and brachytherapy .   Hypothyroidism, postsurgical TSH wnl in February of 2025 Continue synthroid  88 mcg daily    Hypertension, essential,  benign Well controlled.  Hold blood pressure medication.  Due to soft blood pressure PRN meds for now    Hyperlipidemia Continue lipitor 20mg  daily.   Type 2 diabetes mellitus with other specified complication (HCC) A1C of 6.5 in January of 2025 Appears diet controlled  SSI and accuchecks QAC/HS    BPH associated  with nocturia Continue cardura  nightly .  Obesity: Diet and exercise discussed in detail:   DVT prophylaxis: SCDs Code Status: Full code Family Communication: Wife at bedside Disposition Plan:  Status is: Inpatient Remains inpatient appropriate because: Admitted for febrile neutropenia, severity of illness.   Consultants:  None  Procedures: (None)  Antimicrobials:  Anti-infectives (From admission, onward)    Start     Dose/Rate Route Frequency Ordered Stop   08/14/23 2200  fluconazole  (DIFLUCAN ) 40 MG/ML suspension 100 mg       Note to Pharmacy: Take 5mL today, then 2.5mL daily for 20 more days. HOLD ATORVASTATIN  WHILE ON THIS.     100 mg Per Tube Daily at bedtime 08/14/23 2053     08/14/23 1600  ceFEPIme (MAXIPIME) 2 g in sodium chloride  0.9 % 100 mL IVPB        2 g 200 mL/hr over 30 Minutes Intravenous Every 8 hours 08/14/23 1535     08/14/23 1530  vancomycin (VANCOREADY) IVPB 2000 mg/400 mL        2,000 mg 200 mL/hr over 120 Minutes Intravenous  Once 08/14/23 1501 08/14/23 1753   08/14/23 1515  piperacillin-tazobactam (ZOSYN) IVPB 3.375 g  Status:  Discontinued        3.375 g 100 mL/hr over 30 Minutes Intravenous  Once 08/14/23 1501 08/14/23 1534       Subjective: Patient was seen and examined at bedside.  Overnight events noted. Patient is lying comfortably in the bed ,  states he is not feeling well but reports nausea,  reports discomfort, denies abdominal pain.  Objective: Vitals:   08/14/23 1718 08/14/23 2122 08/15/23 0106 08/15/23 0505  BP: (!) 148/72 131/72 132/63 136/65  Pulse: 92 95 98 84  Resp: 18     Temp: 99 F (37.2 C) 98.5 F (36.9 C) 99.7 F (37.6 C) 98.4 F (36.9 C)  TempSrc: Oral Oral Oral Oral  SpO2: 99% 96% 91% 99%  Weight:      Height:        Intake/Output Summary (Last 24 hours) at 08/15/2023 1051 Last data filed at 08/15/2023 1610 Gross per 24 hour  Intake 2705.31 ml  Output 1350 ml  Net 1355.31 ml   Filed Weights   08/14/23  1322  Weight: 120 kg    Examination:  General exam: Appears calm and comfortable, not in any acute distress, morbidly obese. Respiratory system: Clear to auscultation. Respiratory effort normal. RR 15 Cardiovascular system: S1 & S2 heard, RRR. No JVD, murmurs, rubs, gallops or clicks.  Gastrointestinal system: Abdomen is non distended, soft and non tender.  Normal bowel sounds heard. Central nervous system: Alert and oriented x 3. No focal neurological deficits. Extremities: No edema, no cyanosis, no clubbing Skin: No rashes, lesions or ulcers Psychiatry: Judgement and insight appear normal. Mood & affect appropriate.     Data Reviewed: I have personally reviewed following labs and imaging studies  CBC: Recent Labs  Lab 08/13/23 0823 08/14/23 1357 08/15/23 0433  WBC 0.7* 0.9* 0.7*  NEUTROABS 0.5* 0.4* 0.4*  HGB 11.0* 9.9* 8.9*  HCT 31.7* 29.8* 27.6*  MCV 89.3 93.7 95.5  PLT 78* 77* 67*  Basic Metabolic Panel: Recent Labs  Lab 08/13/23 0823 08/14/23 1357 08/15/23 0433  NA 131* 129* 130*  K 4.0 3.7 3.7  CL 93* 94* 96*  CO2 31 26 27   GLUCOSE 142* 187* 196*  BUN 17 20 17   CREATININE 0.55* 0.62 0.58*  CALCIUM  7.9* 7.5* 7.1*  MG 1.2*  --   --    GFR: Estimated Creatinine Clearance: 113.2 mL/min (A) (by C-G formula based on SCr of 0.58 mg/dL (L)). Liver Function Tests: Recent Labs  Lab 08/14/23 1357  AST 34  ALT 34  ALKPHOS 86  BILITOT 1.3*  PROT 5.2*  ALBUMIN 2.4*   No results for input(s): "LIPASE", "AMYLASE" in the last 168 hours. No results for input(s): "AMMONIA" in the last 168 hours. Coagulation Profile: Recent Labs  Lab 08/14/23 1357  INR 1.1   Cardiac Enzymes: No results for input(s): "CKTOTAL", "CKMB", "CKMBINDEX", "TROPONINI" in the last 168 hours. BNP (last 3 results) No results for input(s): "PROBNP" in the last 8760 hours. HbA1C: No results for input(s): "HGBA1C" in the last 72 hours. CBG: Recent Labs  Lab 08/14/23 1823  08/14/23 2035 08/15/23 0739  GLUCAP 127* 125* 116*   Lipid Profile: No results for input(s): "CHOL", "HDL", "LDLCALC", "TRIG", "CHOLHDL", "LDLDIRECT" in the last 72 hours. Thyroid  Function Tests: No results for input(s): "TSH", "T4TOTAL", "FREET4", "T3FREE", "THYROIDAB" in the last 72 hours. Anemia Panel: Recent Labs    08/14/23 1800  VITAMINB12 501  FERRITIN 1,049*  TIBC 123*  IRON 29*   Sepsis Labs: Recent Labs  Lab 08/14/23 1427  LATICACIDVEN 1.6    Recent Results (from the past 240 hours)  Blood Culture (routine x 2)     Status: None (Preliminary result)   Collection Time: 08/14/23  1:51 PM   Specimen: BLOOD  Result Value Ref Range Status   Specimen Description   Final    BLOOD PORTA CATH Performed at [facility], [address] [place], [place] [address]    Special Requests   Final    BOTTLES DRAWN AEROBIC AND ANAEROBIC Blood Culture adequate volume Performed at [facility], [address], [place], [place] [address]    Culture   Final    NO GROWTH < 24 HOURS Performed at [facility], [address] [place], [place] [address]    Report Status PENDING  Incomplete  Resp panel by RT-PCR (RSV, Flu A&B, Covid) Anterior Nasal Swab     Status: None   Collection Time: 08/14/23  3:17 PM   Specimen: Anterior Nasal Swab  Result Value Ref Range Status   SARS Coronavirus 2 by RT PCR NEGATIVE NEGATIVE Final    Comment: (NOTE) SARS-CoV-2 target nucleic acids are NOT DETECTED.  The SARS-CoV-2 RNA is generally detectable in upper respiratory specimens during the acute phase of infection. The lowest concentration of SARS-CoV-2 viral copies this assay can detect is 138 copies/mL. A negative result does not preclude SARS-Cov-2 infection and should not be used as the sole basis for treatment or other patient management decisions. A negative result may occur with  improper specimen collection/handling, submission of specimen  other than nasopharyngeal swab, presence of viral mutation(s) within the areas targeted by this assay, and inadequate number of viral copies(<138 copies/mL). A negative result must be combined with clinical observations, patient history, and epidemiological information. The expected result is Negative.  Fact Sheet for Patients:  BloggerCourse.com  Fact Sheet for Healthcare Providers:  SeriousBroker.it  This test is no t  yet approved or cleared by the [place]  FDA and  has been authorized for detection and/or diagnosis of SARS-CoV-2 by FDA under an Emergency Use Authorization (EUA). This EUA will remain  in effect (meaning this test can be used) for the duration of the COVID-19 declaration under Section 564(b)(1) of the Act, 21 U.S.C.section 360bbb-3(b)(1), unless the authorization is terminated  or revoked sooner.       Influenza A by PCR NEGATIVE NEGATIVE Final   Influenza B by PCR NEGATIVE NEGATIVE Final    Comment: (NOTE) The Xpert Xpress SARS-CoV-2/FLU/RSV plus assay is intended as an aid in the diagnosis of influenza from Nasopharyngeal swab specimens and should not be used as a sole basis for treatment. Nasal washings and aspirates are unacceptable for Xpert Xpress SARS-CoV-2/FLU/RSV testing.  Fact Sheet for Patients: BloggerCourse.com  Fact Sheet for Healthcare Providers: SeriousBroker.it  This test is not yet approved or cleared by the [place]  FDA and has been authorized for detection and/or diagnosis of SARS-CoV-2 by FDA under an Emergency Use Authorization (EUA). This EUA will remain in effect (meaning this test can be used) for the duration of the COVID-19 declaration under Section 564(b)(1) of the Act, 21 U.S.C. section 360bbb-3(b)(1), unless the authorization is terminated or revoked.     Resp Syncytial Virus by PCR NEGATIVE NEGATIVE Final     Comment: (NOTE) Fact Sheet for Patients: BloggerCourse.com  Fact Sheet for Healthcare Providers: SeriousBroker.it  This test is not yet approved or cleared by the [place]  FDA and has been authorized for detection and/or diagnosis of SARS-CoV-2 by FDA under an Emergency Use Authorization (EUA). This EUA will remain in effect (meaning this test can be used) for the duration of the COVID-19 declaration under Section 564(b)(1) of the Act, 21 U.S.C. section 360bbb-3(b)(1), unless the authorization is terminated or revoked.  Performed at [facility], [address] [place], [place] [address]   Blood Culture (routine x 2)     Status: None (Preliminary result)   Collection Time: 08/14/23  6:00 PM   Specimen: BLOOD LEFT HAND  Result Value Ref Range Status   Specimen Description   Final    BLOOD LEFT HAND Performed at [facility], [address], [place], [place] [address]    Special Requests   Final    BOTTLES DRAWN AEROBIC ONLY Blood Culture results may not be optimal due to an inadequate volume of blood received in culture bottles Performed at [facility], [address], [place], [place] [address]    Culture   Final    NO GROWTH < 12 HOURS Performed at [facility], [address] [place], [place] [address]    Report Status PENDING  Incomplete    Radiology Studies: DG Chest Port 1 View Result Date: 08/14/2023 CLINICAL DATA:  Possible sepsis and fevers EXAM: PORTABLE CHEST 1 VIEW COMPARISON:  08/04/2018 FINDINGS: Cardiac shadow is within normal limits somewhat accentuated by the portable technique. Right chest wall port is noted. Lungs are well aerated bilaterally. No focal infiltrate or effusion is seen. No bony abnormality is noted. IMPRESSION: No active disease. Electronically Signed   By: Violeta Grey M.D.   On: 08/14/2023 14:54   Scheduled Meds:  Chlorhexidine Gluconate  Cloth  6 each Topical Daily   doxazosin   1 mg Per Tube QHS   feeding supplement (OSMOLITE 1.5 CAL)  500 mL Per Tube QID   fluconazole   100 mg Per Tube QHS  insulin aspart  0-9 Units Subcutaneous TID WC   levothyroxine   88 mcg Per Tube Q0600   metoCLOPramide   10 mg Per Tube TID AC   pantoprazole  (PROTONIX ) IV  40 mg Intravenous Q24H   polyethylene glycol  17 g Oral Daily   sodium chloride  flush  10-40 mL Intracatheter Q12H   Continuous Infusions:  ceFEPime (MAXIPIME) IV 2 g (08/15/23 0800)     LOS: 1 day    Time spent: 50 MINS    Magdalene School, MD [facility]   If 7PM-7AM, please contact night-coverage

## 2023-08-15 NOTE — Plan of Care (Signed)
  Problem: Nutritional: Goal: Maintenance of adequate nutrition will improve Outcome: Progressing Goal: Progress toward achieving an optimal weight will improve Outcome: Progressing   Problem: Clinical Measurements: Goal: Diagnostic test results will improve Outcome: Progressing Goal: Respiratory complications will improve Outcome: Progressing Goal: Cardiovascular complication will be avoided Outcome: Progressing

## 2023-08-15 NOTE — Progress Notes (Signed)
 [name] FOLLOW UP NOTE  Patient Care Team: [name], [name], MD as PCP - General (Family Medicine) Katheleen Palmer, RN as Oncology Nurse Navigator Maris Sickle, MD as Referring Physician (Ophthalmology) Trent Frizzle, MD as Consulting Physician (Urology) Kenith Payer, MD as Consulting Physician (Radiation Oncology) Neda Balk, RN as Registered Nurse Causey, Laura Polio, NP as Nurse Practitioner (Hematology and Oncology) Colie Dawes, MD as Attending Physician (Radiation Oncology) Artice Last, MD as Consulting Physician (Otolaryngology) Malmfelt, Nancyann Aye, RN as Oncology Nurse Navigator Arlo Berber, MD as Consulting Physician (Oncology)  CHIEF COMPLAINTS/PURPOSE OF CONSULTATION:  Neutropenic fever  ASSESSMENT & PLAN:   This is a very pleasant 73 yr old male patient with T3N2 SCC tonsil on concurrent chemoradiation who is here for neutropenic fever.  1 Neutropenic fever, Total WBC 700, ANC of 400 Agree with neutropenic precautions. He continues on broad spectrum abx. He is doing well today With regards to G-CSF, will have to run it by Dr Lurena Sally in AM. Typically G-CSF can worsen radiation mucositis, so its avoided during concurrent chemoradiation Also he is hemodynamically stable, so we may be able to run it by Dr Lurena Sally and seek her opinion about G-CSF tomorrow. Chemo will be held until recovery from neutropenic fever. If his WBC improve tomorrow and he remains afebrile for 48 hrs, then we can consider discharge and out pt follow up.  2  Mucositis from chemotherapy.  Continue magic mouthwash He didn't complain of intolerable pain, if intolerable pain, ok to consider liquid hyrdocodone or IV pain medication for pain control.  3 Anemia, normocytic normochromic, likely from severe bone marrow suppression. No indication for transfusion  4. Thrombocytopenia,  Once again, no indication for transfusion. Continue monitoring.  Dr Randye Buttner will  follow the patient in AM   Orders Placed This Encounter  Procedures   Critical Care    This order was created via procedure documentation    Standing Status:   Standing    Number of Occurrences:   1   Blood Culture (routine x 2)    Standing Status:   Standing    Number of Occurrences:   2   Resp panel by RT-PCR (RSV, Flu A&B, Covid) Anterior Nasal Swab    Standing Status:   Standing    Number of Occurrences:   1   Urine Culture (for pregnant, neutropenic or urologic patients or patients with an indwelling urinary catheter)    Standing Status:   Standing    Number of Occurrences:   1    Indication:   Sepsis   SARS Coronavirus 2 by RT PCR (hospital order, performed in [facility] hospital lab) *cepheid single result test* Anterior Nasal Swab    Standing Status:   Standing    Number of Occurrences:   1   DG Chest Port 1 View    Standing Status:   Standing    Number of Occurrences:   1    Reason for Exam (SYMPTOM  OR DIAGNOSIS REQUIRED):   Questionable sepsis - evaluate for abnormality   Comprehensive metabolic panel    Standing Status:   Standing    Number of Occurrences:   1   CBC with Differential    Standing Status:   Standing    Number of Occurrences:   1   Urinalysis, w/ Reflex to Culture (Infection Suspected) -Urine, Clean Catch    Standing Status:   Standing    Number of Occurrences:   1  Specimen Source:   Urine, Clean Catch [[id number]]   Protime-INR    Standing Status:   Standing    Number of Occurrences:   1   Basic metabolic panel    Standing Status:   Standing    Number of Occurrences:   1   CBC with Differential/Platelet    Standing Status:   Standing    Number of Occurrences:   5   Ferritin    Standing Status:   Standing    Number of Occurrences:   1   Iron and TIBC    Standing Status:   Standing    Number of Occurrences:   1   Vitamin B12    Standing Status:   Standing    Number of Occurrences:   1   Glucose, capillary    Standing Status:   Standing     Number of Occurrences:   1   Glucose, capillary    Standing Status:   Standing    Number of Occurrences:   1   Glucose, capillary    Standing Status:   Standing    Number of Occurrences:   1   Diet NPO time specified    Standing Status:   Standing    Number of Occurrences:   1   Document height and weight    Standing Status:   Standing    Number of Occurrences:   1   Assess and Document Glasgow Coma Scale    Standing Status:   Standing    Number of Occurrences:   1   Document vital signs within 1-hour of fluid bolus completion.  Notify provider of abnormal vital signs despite fluid resuscitation.    Standing Status:   Standing    Number of Occurrences:   1   Refer to Sidebar Report: Sepsis Bundle ED/IP    Sepsis Bundle ED/IP    Standing Status:   Standing    Number of Occurrences:   1   Notify provider for difficulties obtaining IV access    Standing Status:   Standing    Number of Occurrences:   1   Initiate Carrier Fluid Protocol    Standing Status:   Standing    Number of Occurrences:   1   Cardiac Monitoring Continuous x 24 hours Indications for use: Other; other indications for use: sepsis    Standing Status:   Standing    Number of Occurrences:   1    Indications for use::   Other    other indications for use::   sepsis   Apply Diabetes Mellitus Care Plan    Standing Status:   Standing    Number of Occurrences:   1   STAT CBG when hypoglycemia is suspected. If treated, recheck every 15 minutes after each treatment until CBG >/= 70 mg/dl    Standing Status:   Standing    Number of Occurrences:   1   Refer to Hypoglycemia Protocol Sidebar Report for treatment of CBG < 70 mg/dl    Standing Status:   Standing    Number of Occurrences:   1   No HS correction Insulin    Standing Status:   Standing    Number of Occurrences:   1   Vital signs    Standing Status:   Standing    Number of Occurrences:   1   Notify physician (specify)    Standing Status:   Standing     Number of  Occurrences:   20    Notify Physician:   for pulse less than 55 or greater than 120    Notify Physician:   for respiratory rate less than 12 or greater than 25    Notify Physician:   for temperature greater than 100.5 F    Notify Physician:   for urinary output less than 30 mL/hr for four hours    Notify Physician:   for systolic BP less than 90 or greater than 160, diastolic BP less than 60 or greater than 100    Notify Physician:   for new hypoxia w/ oxygen saturations < 88%   Mobility Protocol: No Restrictions RN to initiate protocols based on patient's level of care    RN to initiate protocols based on patient's level of care    Standing Status:   Standing    Number of Occurrences:   1   Refer to Sidebar Report Refer to ICU, Med-Surg, Progressive, and Step-Down Mobility Protocol Sidebars    Refer to ICU, Med-Surg, Progressive, and Step-Down Mobility Protocol Sidebars    Standing Status:   Standing    Number of Occurrences:   1   Initiate Adult Central Line Maintenance and Catheter Protocol for patients with central line (CVC, PICC, Port, Hemodialysis, Trialysis)    Standing Status:   Standing    Number of Occurrences:   1   Do not place and if present remove PureWick    Standing Status:   Standing    Number of Occurrences:   1   Initiate Oral Care Protocol    Standing Status:   Standing    Number of Occurrences:   1   Initiate Carrier Fluid Protocol    Standing Status:   Standing    Number of Occurrences:   1   RN may order General Admission PRN Orders utilizing "General Admission PRN medications" (through manage orders) for the following patient needs: allergy symptoms (Claritin), cold sores (Carmex), cough (Robitussin DM), eye irritation (Liquifilm Tears), hemorrhoids (Tucks), indigestion (Maalox), minor skin irritation (Hydrocortisone Cream), muscle pain (Ben Gay), nose irritation (saline nasal spray) and sore throat (Chloraseptic spray).    Standing Status:   Standing     Number of Occurrences:   [id number]   SCDs    Standing Status:   Standing    Number of Occurrences:   1    Laterality:   Bilateral   Patient has an active order for admit to inpatient/place in observation    Standing Status:   Standing    Number of Occurrences:   1   Place order for blood cultures x 2 (from different sites) for Temp > 101F    Standing Status:   Standing    Number of Occurrences:   1   PEG Tube Care    Standing Status:   Standing    Number of Occurrences:   1   Refer to Sidebar Report - CHG Cloths Sidebar    - CHG Cloths Sidebar    Standing Status:   Standing    Number of Occurrences:   1   Patient Education: - [facility] Daily CHG Bathing    Standing Status:   Standing    Number of Occurrences:   1    Specify:   - [facility] Daily CHG Bathing   Apply needleless connector (cap)    Standing Status:   Standing    Number of Occurrences:   20   May draw labs from central line  Standing Status:   Standing    Number of Occurrences:   20   For first catheter occlusion notify IV team ([facility], [facility]) or provider at all other sites.    Standing Status:   Standing    Number of Occurrences:   1   May access port-a-cath if present    Standing Status:   Standing    Number of Occurrences:   20   Full code    Standing Status:   Standing    Number of Occurrences:   1    By::   Consent: discussion documented in EHR   Consult to hospitalist    Standing Status:   Standing    Number of Occurrences:   1    Place call to::   [facility]    Reason for Consult:   Admit   Consult to Registered Dietitian    On chronic PEG tube feedings, NPO status due to tonsillar cancer    Standing Status:   Standing    Number of Occurrences:   1    Reason for consult?:   Enteral/tube feeding initiation and management per Adult Tube Feeding and Unclogging Feeding Tube Protocols   Oxygen therapy Mode or (Route): Nasal cannula; Liters Per Minute: 2; Keep O2 saturation between: greater than 92 %    Standing  Status:   Standing    Number of Occurrences:   1    Mode or (Route):   Nasal cannula    Liters Per Minute:   2    Keep O2 saturation between:   greater than 92 %   EKG 12-Lead    Standing Status:   Standing    Number of Occurrences:   1   Insert peripheral IV X 1    Angiocath size 20G or larger    Standing Status:   Standing    Number of Occurrences:   1   Routine line care    Standing Status:   Standing    Number of Occurrences:   18   Admit to Inpatient (patient's expected length of stay will be greater than 2 midnights or inpatient only procedure)    Standing Status:   Standing    Number of Occurrences:   1    Hospital Area:   [facility] [[id number]]    Level of Care:   Progressive [102]    Admit to Progressive based on following criteria:   MULTISYSTEM THREATS such as stable sepsis, metabolic/electrolyte imbalance with or without encephalopathy that is responding to early treatment.    May admit patient to Arlin Benes or Maryan Smalling if equivalent level of care is available::   No    Covid Evaluation:   Symptomatic Person Under Investigation (PUI) or recent exposure (last 10 days) *Testing Required*    Diagnosis:   Febrile neutropenia [facility]) [[id number]]    Admitting Physician:   Raymona Caldwell [[id number]]    Attending Physician:   WOLFE, ALLISON [phone]    Certification::   I certify this patient will need inpatient services for at least 2 midnights    Expected Medical Readiness:   08/16/2023     HISTORY OF PRESENTING ILLNESS:  Isaac Carpenter 73 y.o. male is here because of SCC tonsil.  Oncology History  Malignant neoplasm of prostate (HCC)  05/13/2022 Cancer Staging   Staging form: Prostate, AJCC 8th Edition - Clinical stage from 05/13/2022: Stage IIC (cT1c, cN0, cM0, PSA: 13.2, Grade Group: 3) - Signed by Jena Minor,  Ashlyn, PA-C on 07/17/2022 Histopathologic type: Adenocarcinoma, NOS Stage prefix: Initial diagnosis Prostate specific antigen (PSA) range: 10 to  19 Gleason primary pattern: 4 Gleason secondary pattern: 3 Gleason score: 7 Histologic grading system: 5 grade system Number of biopsy cores examined: 12 Number of biopsy cores positive: 4 Location of positive needle core biopsies: Both sides   07/17/2022 Initial Diagnosis   Malignant neoplasm of prostate (HCC)   Malignant neoplasm of tonsillar fossa (HCC)  06/08/2023 Initial Diagnosis   Malignant neoplasm of tonsillar fossa (HCC)   06/08/2023 Cancer Staging   Staging form: Pharynx - HPV-Mediated Oropharynx, AJCC 8th Edition - Clinical stage from 06/08/2023: Stage II (cT3, cN2, cM0, p16+) - Signed by Colie Dawes, MD on 06/08/2023 Stage prefix: Initial diagnosis   07/09/2023 -  Chemotherapy   Patient is on Treatment Plan : HEAD/NECK Cisplatin  (40) q7d        INTERIM HISTORY  Mr Slomka is a patient of Dr Randye Buttner with T3N2 SCC tonsil on concurrent chemoradiation admitted with neutropenic fever. He didn't have any more fevers since his admission. He complains of severe sore throat and a feeling of rawness in his mouth, dry mouth. No chest pain, cough, dysuria, change in bowel habits. He has a port and G tube in place. He is using G tube for his feeding. At home, he uses lidocaine  for mucositis relief.  All other systems were reviewed with the patient and are negative.   MEDICAL HISTORY:  Past Medical History:  Diagnosis Date   Arthritis    Cancer (HCC)    History of partial thyroidectomy STATES OVERACTIVE THYROID -- NO ISSUES SINCE AGE 51 AND NO MEDS   Hyperlipidemia    Hypertension    Hypothyroidism    Sleep apnea    Per patient he tried CPAP but could not tolerate.   Thyroid  disease     SURGICAL HISTORY: Past Surgical History:  Procedure Laterality Date   CIRCUMCISION  01/04/2012   Procedure: CIRCUMCISION ADULT;  Surgeon: Edmund Gouge, MD;  Location: [facility];  Service: Urology;  Laterality: N/A;   COLONOSCOPY  04/08/2020   Vito Cirigliano    CYSTOSCOPY  09/04/2022   Procedure: CYSTOSCOPY;  Surgeon: Trent Frizzle, MD;  Location: [facility];  Service: Urology;;   IR GASTROSTOMY TUBE MOD SED  06/18/2023   IR IMAGING GUIDED PORT INSERTION  06/18/2023   RADIOACTIVE SEED IMPLANT N/A 09/04/2022   Procedure: RADIOACTIVE SEED IMPLANT/BRACHYTHERAPY IMPLANT;  Surgeon: Trent Frizzle, MD;  Location: [facility];  Service: Urology;  Laterality: N/A;  90 MINS   SPACE OAR INSTILLATION N/A 09/04/2022   Procedure: SPACE OAR INSTILLATION;  Surgeon: Trent Frizzle, MD;  Location: [facility];  Service: Urology;  Laterality: N/A;   THYROIDECTOMY, PARTIAL  AGE 51   OVERACTIVE THYROID     SOCIAL HISTORY: Social History   Socioeconomic History   Marital status: Married    Spouse name: Not on file   Number of children: Not on file   Years of education: Not on file   Highest education level: Not on file  Occupational History   Not on file  Tobacco Use   Smoking status: Never   Smokeless tobacco: Current    Types: Chew   Tobacco comments:    started chewing tobacco off and on since 1970  Vaping Use   Vaping status: Never Used  Substance and Sexual Activity   Alcohol use: Never   Drug use: No   Sexual activity:  Not Currently  Other Topics Concern   Not on file  Social History Narrative   Not on file   Social Drivers of Health   Financial Resource Strain: Low Risk  (10/08/2021)   Overall Financial Resource Strain (CARDIA)    Difficulty of Paying Living Expenses: Not hard at all  Food Insecurity: No Food Insecurity (08/14/2023)   Hunger Vital Sign    Worried About Running Out of Food in the Last Year: Never true    Ran Out of Food in the Last Year: Never true  Transportation Needs: No Transportation Needs (08/14/2023)   PRAPARE - Administrator, Civil Service (Medical): No    Lack of Transportation (Non-Medical): No  Physical Activity: Inactive (10/08/2021)   Exercise  Vital Sign    Days of Exercise per Week: 0 days    Minutes of Exercise per Session: 0 min  Stress: No Stress Concern Present (10/08/2021)   [employer] of Occupational Health - Occupational Stress Questionnaire    Feeling of Stress : Not at all  Social Connections: Moderately Isolated (08/14/2023)   Social Connection and Isolation Panel [NHANES]    Frequency of Communication with Friends and Family: More than three times a week    Frequency of Social Gatherings with Friends and Family: More than three times a week    Attends Religious Services: Never    Database administrator or Organizations: No    Attends Banker Meetings: Never    Marital Status: Married  Catering manager Violence: Not At Risk (08/14/2023)   Humiliation, Afraid, Rape, and Kick questionnaire    Fear of Current or Ex-Partner: No    Emotionally Abused: No    Physically Abused: No    Sexually Abused: No    FAMILY HISTORY: Family History  Problem Relation Age of Onset   Cancer Mother    COPD Father    Heart attack Father    Cancer Sister    Cancer Brother    Cancer Sister    Cancer Brother    Colon cancer Neg Hx    Colon polyps Neg Hx    Esophageal cancer Neg Hx    Rectal cancer Neg Hx    Stomach cancer Neg Hx     ALLERGIES:  is allergic to other and testosterone.  MEDICATIONS:  Current Facility-Administered Medications  Medication Dose Route Frequency Provider Last Rate Last Admin   acetaminophen  (TYLENOL ) 160 MG/5ML solution 650 mg  650 mg Per Tube Q6H PRN Raymona Caldwell, MD       ALPRAZolam  (XANAX ) tablet 0.5 mg  0.5 mg Per Tube BID PRN Raymona Caldwell, MD       ceFEPIme (MAXIPIME) 2 g in sodium chloride  0.9 % 100 mL IVPB  2 g Intravenous Q8H Shireen Dory, RPH 200 mL/hr at 08/15/23 0800 2 g at 08/15/23 0800   Chlorhexidine Gluconate Cloth 2 % PADS 6 each  6 each Topical Daily Raymona Caldwell, MD   6 each at 08/15/23 1001   doxazosin  (CARDURA ) tablet 1 mg  1 mg Per Tube QHS Raymona Caldwell, MD   1 mg at 08/14/23 2305   feeding supplement (OSMOLITE 1.5 CAL) liquid 500 mL  500 mL Per Tube QID Raymona Caldwell, MD   500 mL at 08/15/23 0813   fluconazole  (DIFLUCAN ) 40 MG/ML suspension 100 mg  100 mg Per Tube QHS Raymona Caldwell, MD   100 mg at 08/14/23 2328   HYDROcodone -acetaminophen  (HYCET) 7.5-325 mg/15 ml solution 10  mL  10 mL Per Tube Q6H PRN Raymona Caldwell, MD   10 mL at 08/15/23 0801   insulin aspart (novoLOG) injection 0-9 Units  0-9 Units Subcutaneous TID WC Raymona Caldwell, MD   1 Units at 08/14/23 1823   levothyroxine  (SYNTHROID ) tablet 88 mcg  88 mcg Per Tube Q0600 Raymona Caldwell, MD   88 mcg at 08/15/23 1610   lidocaine  (XYLOCAINE ) 2 % viscous mouth solution 15 mL  15 mL Mouth/Throat Q3H PRN Raymona Caldwell, MD   15 mL at 08/15/23 9604   magic mouthwash w/lidocaine   1 mL Oral QID PRN Magdalene School, MD       metoCLOPramide  (REGLAN ) 10 MG/10ML solution 10 mg  10 mg Per Tube TID Corrinne Din, MD   10 mg at 08/15/23 0800   metoprolol tartrate (LOPRESSOR) injection 2.5 mg  2.5 mg Intravenous Q6H PRN Raymona Caldwell, MD       ondansetron  (ZOFRAN ) tablet 4 mg  4 mg Oral Q6H PRN Raymona Caldwell, MD       Or   ondansetron  (ZOFRAN ) injection 4 mg  4 mg Intravenous Q6H PRN Raymona Caldwell, MD       pantoprazole  (PROTONIX ) injection 40 mg  40 mg Intravenous Q24H Raymona Caldwell, MD   40 mg at 08/15/23 0033   polyethylene glycol (MIRALAX / GLYCOLAX) packet 17 g  17 g Oral Daily Magdalene School, MD       sodium chloride  flush (NS) 0.9 % injection 10-40 mL  10-40 mL Intracatheter Q12H Raymona Caldwell, MD   10 mL at 08/15/23 0814   sodium chloride  flush (NS) 0.9 % injection 10-40 mL  10-40 mL Intracatheter PRN Raymona Caldwell, MD          PHYSICAL EXAMINATION: ECOG PERFORMANCE STATUS: 1 - Symptomatic but completely ambulatory  Vitals:   08/15/23 0106 08/15/23 0505  BP: 132/63 136/65  Pulse: 98 84  Resp:    Temp: 99.7 F (37.6 C) 98.4 F (36.9 C)  SpO2: 91% 99%   Filed  Weights   08/14/23 1322  Weight: 264 lb 8.8 oz (120 kg)    GENERAL:alert, no distress and comfortable Severe mucositis noted orally Rad changes neck Port site appears well CTA bilaterally Heart RRR No LE edema, no compression devices noted.  LABORATORY DATA:  I have reviewed the data as listed Lab Results  Component Value Date   WBC 0.7 (LL) 08/15/2023   HGB 8.9 (L) 08/15/2023   HCT 27.6 (L) 08/15/2023   MCV 95.5 08/15/2023   PLT 67 (L) 08/15/2023   @LASTCHEM @  RADIOGRAPHIC STUDIES: I have personally reviewed the radiological images as listed and agreed with the findings in the report. DG Chest Port 1 View Result Date: 08/14/2023 CLINICAL DATA:  Possible sepsis and fevers EXAM: PORTABLE CHEST 1 VIEW COMPARISON:  08/04/2018 FINDINGS: Cardiac shadow is within normal limits somewhat accentuated by the portable technique. Right chest wall port is noted. Lungs are well aerated bilaterally. No focal infiltrate or effusion is seen. No bony abnormality is noted. IMPRESSION: No active disease. Electronically Signed   By: Violeta Grey M.D.   On: 08/14/2023 14:54    All questions were answered. The patient knows to call the clinic with any problems, questions or concerns. I spent 35 min in the care of the patient, review H and P, counseling and coordination of care.     Murleen Arms, MD 08/15/2023 10:23 AM

## 2023-08-16 ENCOUNTER — Inpatient Hospital Stay

## 2023-08-16 ENCOUNTER — Ambulatory Visit

## 2023-08-16 ENCOUNTER — Ambulatory Visit
Admission: RE | Admit: 2023-08-16 | Discharge: 2023-08-16 | Disposition: A | Discharge status: Home / Self Care | Source: Ambulatory Visit | Attending: Radiation Oncology | Admitting: Radiation Oncology

## 2023-08-16 ENCOUNTER — Ambulatory Visit: Admission: RE | Admit: 2023-08-16 | Source: Ambulatory Visit

## 2023-08-16 DIAGNOSIS — G893 Neoplasm related pain (acute) (chronic): Secondary | ICD-10-CM | POA: Insufficient documentation

## 2023-08-16 DIAGNOSIS — Z87891 Personal history of nicotine dependence: Secondary | ICD-10-CM | POA: Insufficient documentation

## 2023-08-16 DIAGNOSIS — C090 Malignant neoplasm of tonsillar fossa: Secondary | ICD-10-CM | POA: Insufficient documentation

## 2023-08-16 DIAGNOSIS — Z7982 Long term (current) use of aspirin: Secondary | ICD-10-CM | POA: Insufficient documentation

## 2023-08-16 DIAGNOSIS — Z8546 Personal history of malignant neoplasm of prostate: Secondary | ICD-10-CM | POA: Insufficient documentation

## 2023-08-16 DIAGNOSIS — Z79891 Long term (current) use of opiate analgesic: Secondary | ICD-10-CM | POA: Insufficient documentation

## 2023-08-16 DIAGNOSIS — D709 Neutropenia, unspecified: Secondary | ICD-10-CM | POA: Diagnosis not present

## 2023-08-16 DIAGNOSIS — Z95828 Presence of other vascular implants and grafts: Secondary | ICD-10-CM | POA: Insufficient documentation

## 2023-08-16 DIAGNOSIS — Z5111 Encounter for antineoplastic chemotherapy: Secondary | ICD-10-CM | POA: Insufficient documentation

## 2023-08-16 DIAGNOSIS — Z510 Encounter for antineoplastic radiation therapy: Secondary | ICD-10-CM | POA: Insufficient documentation

## 2023-08-16 DIAGNOSIS — M546 Pain in thoracic spine: Secondary | ICD-10-CM | POA: Insufficient documentation

## 2023-08-16 DIAGNOSIS — Z931 Gastrostomy status: Secondary | ICD-10-CM | POA: Insufficient documentation

## 2023-08-16 DIAGNOSIS — Z923 Personal history of irradiation: Secondary | ICD-10-CM | POA: Insufficient documentation

## 2023-08-16 DIAGNOSIS — R5081 Fever presenting with conditions classified elsewhere: Secondary | ICD-10-CM | POA: Diagnosis not present

## 2023-08-16 DIAGNOSIS — Z79899 Other long term (current) drug therapy: Secondary | ICD-10-CM | POA: Insufficient documentation

## 2023-08-16 DIAGNOSIS — R1310 Dysphagia, unspecified: Secondary | ICD-10-CM | POA: Insufficient documentation

## 2023-08-16 DIAGNOSIS — C61 Malignant neoplasm of prostate: Secondary | ICD-10-CM | POA: Insufficient documentation

## 2023-08-16 DIAGNOSIS — F419 Anxiety disorder, unspecified: Secondary | ICD-10-CM | POA: Insufficient documentation

## 2023-08-16 LAB — BASIC METABOLIC PANEL WITH GFR
Anion gap: 10 (ref 5–15)
BUN: 16 mg/dL (ref 8–23)
CO2: 25 mmol/L (ref 22–32)
Calcium: 7 mg/dL — ABNORMAL LOW (ref 8.9–10.3)
Chloride: 92 mmol/L — ABNORMAL LOW (ref 98–111)
Creatinine, Ser: 0.42 mg/dL — ABNORMAL LOW (ref 0.61–1.24)
GFR, Estimated: >60 mL/min (ref >60)
Glucose, Bld: 186 mg/dL — ABNORMAL HIGH (ref 70–99)
Potassium: 3.6 mmol/L (ref 3.5–5.1)
Sodium: 127 mmol/L — ABNORMAL LOW (ref 135–145)

## 2023-08-16 LAB — CBC WITH DIFFERENTIAL/PLATELET
Abs Immature Granulocytes: 0.01 10*3/uL (ref 0.00–0.07)
Basophils Absolute: 0 10*3/uL (ref 0.0–0.1)
Basophils Relative: 0 %
Eosinophils Absolute: 0 10*3/uL (ref 0.0–0.5)
Eosinophils Relative: 1 %
HCT: 26.5 % — ABNORMAL LOW (ref 39.0–52.0)
Hemoglobin: 8.8 g/dL — ABNORMAL LOW (ref 13.0–17.0)
Immature Granulocytes: 1 %
Lymphocytes Relative: 25 %
Lymphs Abs: 0.2 10*3/uL — ABNORMAL LOW (ref 0.7–4.0)
MCH: 31 pg (ref 26.0–34.0)
MCHC: 33.2 g/dL (ref 30.0–36.0)
MCV: 93.3 fL (ref 80.0–100.0)
Monocytes Absolute: 0.1 10*3/uL (ref 0.1–1.0)
Monocytes Relative: 13 %
Neutro Abs: 0.6 10*3/uL — ABNORMAL LOW (ref 1.7–7.7)
Neutrophils Relative %: 60 %
Platelets: 80 10*3/uL — ABNORMAL LOW (ref 150–400)
RBC: 2.84 MIL/uL — ABNORMAL LOW (ref 4.22–5.81)
RDW: 14.9 % (ref 11.5–15.5)
Smear Review: NORMAL
WBC: 0.9 10*3/uL — CL (ref 4.0–10.5)
nRBC: 0 % (ref 0.0–0.2)

## 2023-08-16 LAB — GLUCOSE, CAPILLARY
Glucose-Capillary: 141 mg/dL — ABNORMAL HIGH (ref 70–99)
Glucose-Capillary: 142 mg/dL — ABNORMAL HIGH (ref 70–99)
Glucose-Capillary: 154 mg/dL — ABNORMAL HIGH (ref 70–99)
Glucose-Capillary: 206 mg/dL — ABNORMAL HIGH (ref 70–99)

## 2023-08-16 LAB — PHOSPHORUS: Phosphorus: 2.4 mg/dL — ABNORMAL LOW (ref 2.5–4.6)

## 2023-08-16 LAB — MAGNESIUM: Magnesium: 1.4 mg/dL — ABNORMAL LOW (ref 1.7–2.4)

## 2023-08-16 MED ORDER — OSMOLITE 1.5 CAL PO LIQD
355.0000 mL | Freq: Every day | ORAL | Status: DC
  Administered 2023-08-16 – 2023-08-17 (×6): 355 mL
  Filled 2023-08-16 (×8): qty 474

## 2023-08-16 MED ORDER — PROSOURCE TF20 ENFIT COMPATIBL EN LIQD
60.0000 mL | Freq: Two times a day (BID) | ENTERAL | Status: DC
  Administered 2023-08-16 – 2023-08-17 (×3): 60 mL
  Filled 2023-08-16 (×4): qty 60

## 2023-08-16 MED ORDER — HYDROCODONE-ACETAMINOPHEN 7.5-325 MG/15ML PO SOLN
10.0000 mL | ORAL | Status: DC | PRN
  Administered 2023-08-16 – 2023-08-17 (×5): 10 mL
  Filled 2023-08-16 (×5): qty 15

## 2023-08-16 MED ORDER — FILGRASTIM-AAFI 300 MCG/0.5ML IJ SOSY
300.0000 ug | PREFILLED_SYRINGE | Freq: Every day | INTRAMUSCULAR | Status: DC
  Administered 2023-08-16 – 2023-08-17 (×2): 300 ug via SUBCUTANEOUS
  Filled 2023-08-16 (×2): qty 0.5

## 2023-08-16 NOTE — Progress Notes (Signed)
 Initial Nutrition Assessment  DOCUMENTATION CODES:   Non-severe (moderate) malnutrition in context of chronic illness  INTERVENTION:   Continue gravity bolus regimen -Administer 1.5 cartons (355 ml) Osmolite 1.5 five times daily. Total: 7.5 cartons daily. -60 ml Prosource TF 20 BID -Free water : 60 ml before and after each feedings -Provides 2822 kcals, 151g protein and 1957 ml H2O  Home recommendations: -Nutren 1.5 - 7.5 cartons daily -Potentially 1.5 cartons five times daily -Provides 2850 kcals, 127g protein and 1432 ml H2O -Wife to order protein modular to help meet 100% of protein needs. -Consider Nutren 2.0 if still unable to tolerate volumes  NUTRITION DIAGNOSIS:   Moderate Malnutrition related to chronic illness, cancer and cancer related treatments as evidenced by percent weight loss, mild muscle depletion.  GOAL:   Patient will meet greater than or equal to 90% of their needs  MONITOR:   TF tolerance  REASON FOR ASSESSMENT:   Consult Enteral/tube feeding initiation and management  ASSESSMENT:   73 years old male with PMH significant for hypertension, hyperlipidemia, history of partial thyroidectomy with postsurgical hypothyroidism, OSA not on CPAP, history of prostate cancer, diagnosed in 3/24 s/p radio active seed implant, squamous cell carcinoma of right tonsil, type 2 diabetes presented in the ED with complaints of fever for 1 day. Patient was admitted for neutropenic fever and started on empiric antibiotic in the setting of immunocompromise state.  Patient in room, unable to speak much d/t mucositis. Wife at bedside and provides most history.  Mucositis symptoms have worsened since starting chemo and radiation. Pt had PEG placed 2/28. Has been slowly trying to advance to goal rate of 8 cartons daily of Nutren 1.5. Equivalent we have on our formulary is Osmolite 1.5. Pt unable to tolerate 2 cartons at a time with feedings. At most would like to stick with 1.5  cartons at a time. Spoke with wife about how to spread out feeds for better tolerance and to get all cartons needed within 24 hours. Pt with high needs, has been receiving IVF three times a week which helps him meet his fluid needs at this time.  Wife with concerns pt won't tolerate 8 cartons daily. Pt sleeps for long periods of time, he struggles with motion sickness if he gets a feeding then gets in the car for treatments. Pt feels like feedings are "punishment". Following treatments he is often too exhausted for feedings. At most they have administered 6 cartons.  If cannot tolerate 1.5 formula, will need to consider transition to Nutren 2.0 going forward. Will monitor.  Pt's wife willing to buy protein modulars out of pocket to help lessen volume of feeds if needed.  Admission weight: 264 lbs Daily weights order with tube feeding protocol. Per weight records, pt has lost 24 lbs since 1/27 (8% wt loss x 3 months, significant for time frame).  Medications: Reglan , Miralax  Labs reviewed: CBGs: 141-201 Low Na Low Phos Low Mg   NUTRITION - FOCUSED PHYSICAL EXAM:  Flowsheet Row Most Recent Value  Orbital Region No depletion  Upper Arm Region No depletion  Thoracic and Lumbar Region No depletion  Buccal Region Mild depletion  Temple Region Mild depletion  Clavicle Bone Region Mild depletion  Scapular Bone Region No depletion  Dorsal Hand No depletion  Patellar Region No depletion  Anterior Thigh Region No depletion  Posterior Calf Region No depletion  Edema (RD Assessment) None  Hair Reviewed  Eyes Reviewed  Mouth Unable to assess  [mucositis]  Skin Reviewed  [  bruising all over BUEs]  Nails Reviewed       Diet Order:   Diet Order             Diet NPO time specified  Diet effective now                   EDUCATION NEEDS:   Education needs have been addressed  Skin:  Skin Assessment: Reviewed RN Assessment  Last BM:  4/25  Height:   Ht Readings from Last 1  Encounters:  08/14/23 6\' 1"  (1.854 m)    Weight:   Wt Readings from Last 1 Encounters:  08/14/23 120 kg    BMI:  Body mass index is 34.9 kg/m.  Estimated Nutritional Needs:   Kcal:  7829-5621  Protein:  125-140g  Fluid:  2.5L/day   Arna Better, MS, RD, LDN Inpatient Clinical Dietitian Contact via Secure chat

## 2023-08-16 NOTE — Progress Notes (Signed)
 [facility] Liaison Note  08/16/2023  Isaac Carpenter 06-Nov-1950 [id number]  Location: RN Hospital Liaison screened the patient remotely at [facility].  Insurance: [employer]   Isaac Carpenter is a 73 y.o. male who is a Primary Care Patient of [name], Theotis Flake, MD [facility] [place] [employer] at [place]. he patient was screened for  readmission hospitalization with noted high risk score for unplanned readmission risk with 1 IP/2 ED in 6 months.  The patient was assessed for potential Care Management service needs for post hospital transition for care coordination. Review of patient's electronic medical record reveals patient was admitted with Afebrile neutropenia. Pt is followed heavily by the oncology team.  No anticipated needs to address at this time.  Plan: [facility] Liaison will continue to follow progress and disposition to asess for post hospital community care coordination/management needs.  Referral request for community care coordination: anticipate Transitions of Care Team follow up.   [facility]I Care Management/Population Health does not replace or interfere with any arrangements made by the Inpatient Transition of Care team.   For questions contact:   Lilla Reichert, RN, BSN Hospital Liaison [place]   [facility], Population Health Office Hours MTWF  8:00 am-6:00 pm Direct Dial: [phone] mobile [email or web address]

## 2023-08-16 NOTE — Plan of Care (Signed)
  Problem: Metabolic: Goal: Ability to maintain appropriate glucose levels will improve Outcome: Progressing   Problem: Nutritional: Goal: Maintenance of adequate nutrition will improve Outcome: Progressing Goal: Progress toward achieving an optimal weight will improve Outcome: Progressing   Problem: Clinical Measurements: Goal: Diagnostic test results will improve Outcome: Progressing

## 2023-08-16 NOTE — Progress Notes (Signed)
 PROGRESS NOTE    Isaac Carpenter  GNF:[id number] DOB: 23-Apr-1950 DOA: 08/14/2023 PCP: [name], [name], MD    Brief Narrative:  This 73 years old male with PMH significant for hypertension, hyperlipidemia, history of partial thyroidectomy with postsurgical hypothyroidism, OSA not on CPAP, history of prostate cancer, diagnosed in 3/24 s/p radio active seed implant, squamous cell carcinoma of right tonsil, type 2 diabetes presented in the ED with complaints of fever for 1 day. Patient states fever happened last night around 8 pm but he does not recall how high it was.  Fever resolved by itself,  he had another fever episode in the morning so his oncologist advised him to come to the hospital for evaluation. Patient states he has dysuria at times His urine does seem darker in color. He was febrile on arrival in the ED.  Patient was admitted for neutropenic fever and started on empiric antibiotic in the setting of immunocompromise state.  Assessment & Plan:   Principal Problem:   Febrile neutropenia (HCC) Active Problems:   Hyponatremia   Normocytic anemia   S/P percutaneous endoscopic gastrostomy (PEG) tube placement (HCC)   Malignant neoplasm of tonsillar fossa (HCC)   Malignant neoplasm of prostate (HCC)   Hypothyroidism, postsurgical   Hypertension, essential, benign   Hyperlipidemia   Type 2 diabetes mellitus with other specified complication (HCC)   BPH associated with nocturia   Febrile neutropenia [facility]): Patient presented with fever in setting of immunocompromised state.  He has Neutropenia due to right tonsillar cancer on chemotherapy (cisplastin).  Last chemo infusion 08/06/23.  Cycle 5 held yesterday due to neutropenia with ANC of 500.  He was febrile to 101.2 with WBC of 0.9 and ANC of 400 Empirically initiated on vancomycin / Zosyn.  De-escalated to cefepime. Patient has taken 2 doses of Cipro  prior to coming to the hospital. Blood and urine culture No growth so far. CXR  clear, port/PEG tube clean and no other signs of infection.  Urine possible source as he states it is darker and some dysuria with sitting while urinating, although this is not new. COVID and RSV, influenza negative. Oncology is consulted, continue IV antibiotics awaiting cultures.   Chemo will be held until recovery from neutropenic fever. Cycle 5 held yesterday. Xarxio px awaiting PA,  Continue antipyretics.   Hyponatremia: Near baseline, received IVF in ED. TSH wnl in 05/2023 Trend Serum Sodium.130 > 127   Normocytic anemia Hgb downtrending from 3 weeks ago . Chemo held this week . Serum iron low.  Continue iron supplement. Trend H/H   S/P percutaneous endoscopic gastrostomy (PEG) tube placement [facility]) Site is clean, no signs of infection. Nutrition consult for continued PEG feedings . Routine PEG tube care .   Malignant neoplasm of tonsillar fossa (HCC) Dx'd in February of 2025 Oral mucositis and has PEG tube with NPO status  He started cycle 1 of cisplatin  on 07/09/2023.  Plan was to continue cisplatin  weekly during the course of radiation. He began radiation treatments from 07/12/2023. He was seen in onoclogy clinic yesterday for this 5th cycle of chemo but was found to be neutropenic with ANC of 500. Chemo held.  Zarxio support next week for 3 days will be planned, pending authorization.  He was started on liquid cipro -unsure if he picked this up .   Malignant neoplasm of prostate (HCC) Dx'd in March of 2024. S/p radio active seed implant and brachytherapy .   Hypothyroidism, postsurgical TSH wnl in February of 2025 Continue synthroid   88 mcg daily    Hypertension, essential, benign Well controlled.  Hold blood pressure medication.  Due to soft blood pressure PRN meds for now    Hyperlipidemia Continue lipitor 20mg  daily.   Type 2 diabetes mellitus with other specified complication (HCC) A1C of 6.5 in January of 2025 Appears diet controlled  SSI and accuchecks  QAC/HS    BPH associated with nocturia Continue cardura  nightly .  Obesity: Diet and exercise discussed in detail:   DVT prophylaxis: SCDs Code Status: Full code Family Communication: Wife at bedside Disposition Plan:  Status is: Inpatient Remains inpatient appropriate because: Admitted for febrile neutropenia, severity of illness.   Consultants:  Oncology  Procedures: (None)  Antimicrobials:  Anti-infectives (From admission, onward)    Start     Dose/Rate Route Frequency Ordered Stop   08/14/23 2200  fluconazole  (DIFLUCAN ) 40 MG/ML suspension 100 mg       Note to Pharmacy: Take 5mL today, then 2.5mL daily for 20 more days. HOLD ATORVASTATIN  WHILE ON THIS.     100 mg Per Tube Daily at bedtime 08/14/23 2053     08/14/23 1600  ceFEPIme (MAXIPIME) 2 g in sodium chloride  0.9 % 100 mL IVPB        2 g 200 mL/hr over 30 Minutes Intravenous Every 8 hours 08/14/23 1535     08/14/23 1530  vancomycin (VANCOREADY) IVPB 2000 mg/400 mL        2,000 mg 200 mL/hr over 120 Minutes Intravenous  Once 08/14/23 1501 08/14/23 1753   08/14/23 1515  piperacillin-tazobactam (ZOSYN) IVPB 3.375 g  Status:  Discontinued        3.375 g 100 mL/hr over 30 Minutes Intravenous  Once 08/14/23 1501 08/14/23 1534       Subjective: Patient was seen and examined at bedside.Overnight events noted. Patient is lying comfortably in the bed , patient returned from radiation treatment , It was not done today.  States he is not feeling well .  Objective: Vitals:   08/16/23 0505 08/16/23 0800 08/16/23 1026 08/16/23 1214  BP: (!) 141/68   (!) 148/74  Pulse: (!) 102   85  Resp: 17 17 18 17   Temp: 99 F (37.2 C) 98.7 F (37.1 C) 98.2 F (36.8 C) 98.4 F (36.9 C)  TempSrc: Oral Oral Oral Oral  SpO2: 97%   98%  Weight:      Height:        Intake/Output Summary (Last 24 hours) at 08/16/2023 1256 Last data filed at 08/16/2023 1610 Gross per 24 hour  Intake 771.5 ml  Output 1900 ml  Net -1128.5 ml    Filed Weights   08/14/23 1322  Weight: 120 kg    Examination:  General exam: Appears calm and comfortable, not in any acute distress, morbidly obese. Respiratory system: CTA Bilaterally. Respiratory effort normal. RR 15 Cardiovascular system: S1 & S2 heard, RRR. No JVD, murmurs, rubs, gallops or clicks.  Gastrointestinal system: Abdomen is non distended, soft and non tender.  Normal bowel sounds heard. Central nervous system: Alert and oriented x 3. No focal neurological deficits. Extremities: No edema, no cyanosis, no clubbing Skin: No rashes, lesions or ulcers Psychiatry: Judgement and insight appear normal. Mood & affect appropriate.     Data Reviewed: I have personally reviewed following labs and imaging studies  CBC: Recent Labs  Lab 08/13/23 0823 08/14/23 1357 08/15/23 0433 08/16/23 0341  WBC 0.7* 0.9* 0.7* 0.9*  NEUTROABS 0.5* 0.4* 0.4* 0.6*  HGB 11.0* 9.9* 8.9* 8.8*  HCT 31.7* 29.8* 27.6* 26.5*  MCV 89.3 93.7 95.5 93.3  PLT 78* 77* 67* 80*   Basic Metabolic Panel: Recent Labs  Lab 08/13/23 0823 08/14/23 1357 08/15/23 0433 08/16/23 0341  NA 131* 129* 130* 127*  K 4.0 3.7 3.7 3.6  CL 93* 94* 96* 92*  CO2 31 26 27 25   GLUCOSE 142* 187* 196* 186*  BUN 17 20 17 16   CREATININE 0.55* 0.62 0.58* 0.42*  CALCIUM  7.9* 7.5* 7.1* 7.0*  MG 1.2*  --   --  1.4*  PHOS  --   --   --  2.4*   GFR: Estimated Creatinine Clearance: 113.2 mL/min (A) (by C-G formula based on SCr of 0.42 mg/dL (L)). Liver Function Tests: Recent Labs  Lab 08/14/23 1357  AST 34  ALT 34  ALKPHOS 86  BILITOT 1.3*  PROT 5.2*  ALBUMIN 2.4*   No results for input(s): "LIPASE", "AMYLASE" in the last 168 hours. No results for input(s): "AMMONIA" in the last 168 hours. Coagulation Profile: Recent Labs  Lab 08/14/23 1357  INR 1.1   Cardiac Enzymes: No results for input(s): "CKTOTAL", "CKMB", "CKMBINDEX", "TROPONINI" in the last 168 hours. BNP (last 3 results) No results for  input(s): "PROBNP" in the last 8760 hours. HbA1C: No results for input(s): "HGBA1C" in the last 72 hours. CBG: Recent Labs  Lab 08/15/23 1125 08/15/23 1619 08/15/23 2024 08/16/23 0720 08/16/23 1212  GLUCAP 188* 173* 201* 141* 142*   Lipid Profile: No results for input(s): "CHOL", "HDL", "LDLCALC", "TRIG", "CHOLHDL", "LDLDIRECT" in the last 72 hours. Thyroid  Function Tests: No results for input(s): "TSH", "T4TOTAL", "FREET4", "T3FREE", "THYROIDAB" in the last 72 hours. Anemia Panel: Recent Labs    08/14/23 1800  VITAMINB12 501  FERRITIN 1,049*  TIBC 123*  IRON 29*   Sepsis Labs: Recent Labs  Lab 08/14/23 1427  LATICACIDVEN 1.6    Recent Results (from the past 240 hours)  Blood Culture (routine x 2)     Status: None (Preliminary result)   Collection Time: 08/14/23  1:51 PM   Specimen: BLOOD  Result Value Ref Range Status   Specimen Description   Final    BLOOD PORTA CATH Performed at [facility], [address] [place], [place] [address]    Special Requests   Final    BOTTLES DRAWN AEROBIC AND ANAEROBIC Blood Culture adequate volume Performed at [facility], [address], [place], [place] [address]    Culture   Final    NO GROWTH 2 DAYS Performed at [facility], [address], [place], [place] [address]    Report Status PENDING  Incomplete  Resp panel by RT-PCR (RSV, Flu A&B, Covid) Anterior Nasal Swab     Status: None   Collection Time: 08/14/23  3:17 PM   Specimen: Anterior Nasal Swab  Result Value Ref Range Status   SARS Coronavirus 2 by RT PCR NEGATIVE NEGATIVE Final    Comment: (NOTE) SARS-CoV-2 target nucleic acids are NOT DETECTED.  The SARS-CoV-2 RNA is generally detectable in upper respiratory specimens during the acute phase of infection. The lowest concentration of SARS-CoV-2 viral copies this assay can detect is 138 copies/mL. A negative result does not preclude SARS-Cov-2 infection and should  not be used as the sole basis for treatment or other patient management decisions. A negative result may occur with  improper specimen collection/handling, submission of specimen other than nasopharyngeal swab, presence of viral mutation(s) within the areas targeted by this assay,  and inadequate number of viral copies(<138 copies/mL). A negative result must be combined with clinical observations, patient history, and epidemiological information. The expected result is Negative.  Fact Sheet for Patients:  BloggerCourse.com  Fact Sheet for Healthcare Providers:  SeriousBroker.it  This test is no t yet approved or cleared by the [place]  FDA and  has been authorized for detection and/or diagnosis of SARS-CoV-2 by FDA under an Emergency Use Authorization (EUA). This EUA will remain  in effect (meaning this test can be used) for the duration of the COVID-19 declaration under Section 564(b)(1) of the Act, 21 U.S.C.section 360bbb-3(b)(1), unless the authorization is terminated  or revoked sooner.       Influenza A by PCR NEGATIVE NEGATIVE Final   Influenza B by PCR NEGATIVE NEGATIVE Final    Comment: (NOTE) The Xpert Xpress SARS-CoV-2/FLU/RSV plus assay is intended as an aid in the diagnosis of influenza from Nasopharyngeal swab specimens and should not be used as a sole basis for treatment. Nasal washings and aspirates are unacceptable for Xpert Xpress SARS-CoV-2/FLU/RSV testing.  Fact Sheet for Patients: BloggerCourse.com  Fact Sheet for Healthcare Providers: SeriousBroker.it  This test is not yet approved or cleared by the [place]  FDA and has been authorized for detection and/or diagnosis of SARS-CoV-2 by FDA under an Emergency Use Authorization (EUA). This EUA will remain in effect (meaning this test can be used) for the duration of the COVID-19 declaration under  Section 564(b)(1) of the Act, 21 U.S.C. section 360bbb-3(b)(1), unless the authorization is terminated or revoked.     Resp Syncytial Virus by PCR NEGATIVE NEGATIVE Final    Comment: (NOTE) Fact Sheet for Patients: BloggerCourse.com  Fact Sheet for Healthcare Providers: SeriousBroker.it  This test is not yet approved or cleared by the [place]  FDA and has been authorized for detection and/or diagnosis of SARS-CoV-2 by FDA under an Emergency Use Authorization (EUA). This EUA will remain in effect (meaning this test can be used) for the duration of the COVID-19 declaration under Section 564(b)(1) of the Act, 21 U.S.C. section 360bbb-3(b)(1), unless the authorization is terminated or revoked.  Performed at [facility], [address] [place], [place] [address]   Urine Culture (for pregnant, neutropenic or urologic patients or patients with an indwelling urinary catheter)     Status: None   Collection Time: 08/14/23  4:55 PM   Specimen: Urine, Clean Catch  Result Value Ref Range Status   Specimen Description   Final    URINE, CLEAN CATCH Performed at [facility], [address], [place], [place] [address]    Special Requests   Final    NONE Performed at [facility], [address], [place], [place] [address]    Culture   Final    NO GROWTH Performed at [facility], [address] [place], [place] [address]    Report Status 08/15/2023 FINAL  Final  Blood Culture (routine x 2)     Status: None (Preliminary result)   Collection Time: 08/14/23  6:00 PM   Specimen: BLOOD LEFT HAND  Result Value Ref Range Status   Specimen Description   Final    BLOOD LEFT HAND Performed at [facility], [address] [place], [place] [address]    Special Requests   Final    BOTTLES DRAWN AEROBIC ONLY Blood Culture results may not be optimal due to an inadequate  volume of blood received in culture bottles Performed at [facility], [address] [place], [place] [address]    Culture   Final    NO GROWTH 2 DAYS Performed at [facility], [address] [place], [place] [address]    Report Status PENDING  Incomplete    Radiology Studies: DG Chest Port 1 View Result Date: 08/14/2023 CLINICAL DATA:  Possible sepsis and fevers EXAM: PORTABLE CHEST 1 VIEW COMPARISON:  08/04/2018 FINDINGS: Cardiac shadow is within normal limits somewhat accentuated by the portable technique. Right chest wall port is noted. Lungs are well aerated bilaterally. No focal infiltrate or effusion is seen. No bony abnormality is noted. IMPRESSION: No active disease. Electronically Signed   By: Violeta Grey M.D.   On: 08/14/2023 14:54   Scheduled Meds:  Chlorhexidine Gluconate Cloth  6 each Topical Daily   doxazosin   1 mg Per Tube QHS   feeding supplement (OSMOLITE 1.5 CAL)  355 mL Per Tube 5 X Daily   feeding supplement (PROSource TF20)  60 mL Per Tube BID   filgrastim (NIVESTYM) SQ  300 mcg Subcutaneous Q1200   fluconazole   100 mg Per Tube QHS   insulin aspart  0-9 Units Subcutaneous TID WC   levothyroxine   88 mcg Per Tube Q0600   metoCLOPramide   10 mg Per Tube TID AC   pantoprazole  (PROTONIX ) IV  40 mg Intravenous Q24H   polyethylene glycol  17 g Oral Daily   sodium chloride  flush  10-40 mL Intracatheter Q12H   Continuous Infusions:  ceFEPime (MAXIPIME) IV 2 g (08/16/23 0736)     LOS: 2 days    Time spent: 35 MINS    Magdalene School, MD [facility]   If 7PM-7AM, please contact night-coverage

## 2023-08-17 ENCOUNTER — Inpatient Hospital Stay

## 2023-08-17 ENCOUNTER — Ambulatory Visit

## 2023-08-17 DIAGNOSIS — D709 Neutropenia, unspecified: Secondary | ICD-10-CM | POA: Diagnosis not present

## 2023-08-17 DIAGNOSIS — R5081 Fever presenting with conditions classified elsewhere: Secondary | ICD-10-CM | POA: Diagnosis not present

## 2023-08-17 LAB — CBC WITH DIFFERENTIAL/PLATELET
Abs Immature Granulocytes: 0.02 10*3/uL (ref 0.00–0.07)
Basophils Absolute: 0 10*3/uL (ref 0.0–0.1)
Basophils Relative: 1 %
Eosinophils Absolute: 0 10*3/uL (ref 0.0–0.5)
Eosinophils Relative: 1 %
HCT: 26.9 % — ABNORMAL LOW (ref 39.0–52.0)
Hemoglobin: 9.2 g/dL — ABNORMAL LOW (ref 13.0–17.0)
Immature Granulocytes: 2 %
Lymphocytes Relative: 19 %
Lymphs Abs: 0.3 10*3/uL — ABNORMAL LOW (ref 0.7–4.0)
MCH: 31.7 pg (ref 26.0–34.0)
MCHC: 34.2 g/dL (ref 30.0–36.0)
MCV: 92.8 fL (ref 80.0–100.0)
Monocytes Absolute: 0.1 10*3/uL (ref 0.1–1.0)
Monocytes Relative: 8 %
Neutro Abs: 0.9 10*3/uL — ABNORMAL LOW (ref 1.7–7.7)
Neutrophils Relative %: 69 %
Platelets: 86 10*3/uL — ABNORMAL LOW (ref 150–400)
RBC: 2.9 MIL/uL — ABNORMAL LOW (ref 4.22–5.81)
RDW: 14.6 % (ref 11.5–15.5)
Smear Review: NORMAL
WBC Morphology: >10
WBC: 1.3 10*3/uL — CL (ref 4.0–10.5)
nRBC: 0 % (ref 0.0–0.2)

## 2023-08-17 LAB — GLUCOSE, CAPILLARY
Glucose-Capillary: 156 mg/dL — ABNORMAL HIGH (ref 70–99)
Glucose-Capillary: 180 mg/dL — ABNORMAL HIGH (ref 70–99)
Glucose-Capillary: 184 mg/dL — ABNORMAL HIGH (ref 70–99)
Glucose-Capillary: 186 mg/dL — ABNORMAL HIGH (ref 70–99)
Glucose-Capillary: 200 mg/dL — ABNORMAL HIGH (ref 70–99)

## 2023-08-17 LAB — BASIC METABOLIC PANEL WITH GFR
Anion gap: 8 (ref 5–15)
BUN: 22 mg/dL (ref 8–23)
CO2: 27 mmol/L (ref 22–32)
Calcium: 7.1 mg/dL — ABNORMAL LOW (ref 8.9–10.3)
Chloride: 94 mmol/L — ABNORMAL LOW (ref 98–111)
Creatinine, Ser: 0.35 mg/dL — ABNORMAL LOW (ref 0.61–1.24)
GFR, Estimated: >60 mL/min (ref >60)
Glucose, Bld: 182 mg/dL — ABNORMAL HIGH (ref 70–99)
Potassium: 3.8 mmol/L (ref 3.5–5.1)
Sodium: 129 mmol/L — ABNORMAL LOW (ref 135–145)

## 2023-08-17 LAB — PHOSPHORUS: Phosphorus: 2.8 mg/dL (ref 2.5–4.6)

## 2023-08-17 LAB — MAGNESIUM: Magnesium: 1.4 mg/dL — ABNORMAL LOW (ref 1.7–2.4)

## 2023-08-17 MED ORDER — ORAL CARE MOUTH RINSE: 15.0000 mL | OROMUCOSAL | Status: DC | PRN

## 2023-08-17 MED ORDER — HEPARIN SOD (PORK) LOCK FLUSH 100 UNIT/ML IV SOLN
500.0000 [IU] | INTRAVENOUS | Status: AC | PRN
  Administered 2023-08-17: 500 [IU]

## 2023-08-17 MED ORDER — MAGNESIUM SULFATE 2 GM/50ML IV SOLN
2.0000 g | Freq: Once | INTRAVENOUS | Status: AC
Start: 2023-08-17 — End: 2023-08-17
  Administered 2023-08-17: 2 g via INTRAVENOUS
  Filled 2023-08-17: qty 50

## 2023-08-17 MED ORDER — ORAL CARE MOUTH RINSE
15.0000 mL | OROMUCOSAL | Status: DC
  Administered 2023-08-17 (×2): 15 mL via OROMUCOSAL

## 2023-08-17 NOTE — Discharge Summary (Signed)
 Physician Discharge Summary  Isaac Carpenter UVO:[id number] DOB: 1950-07-22 DOA: 08/14/2023  PCP: [name], [name], MD  Admit date: 08/14/2023  Discharge date: 08/17/2023  Admitted From: Home  Disposition:  Home  Recommendations for Outpatient Follow-up:  Follow up with PCP in 1-2 weeks. Please obtain BMP/CBC in one week. Advised to follow-up oncologist Dr. Vernita Goodnight as scheduled. Advised to continue ciprofloxacin  as prescribed.  Home Health:None Equipment/Devices:None  Discharge Condition: Stable CODE STATUS:Full code Diet recommendation: Heart Healthy   Brief [facility] Course: This 73 years old male with PMH significant for hypertension, hyperlipidemia, history of partial thyroidectomy with postsurgical hypothyroidism, OSA not on CPAP, history of prostate cancer, diagnosed in 3/24 s/p radio active seed implant, squamous cell carcinoma of right tonsil, type 2 diabetes presented in the ED with complaints of fever for 1 day. Patient states fever happened last night around 8 pm but he does not recall how high it was.  Fever resolved by itself,  he had another fever episode in the morning so his oncologist advised him to come to the hospital for evaluation. Patient states he has dysuria at times,  His urine does seem darker in color. He was febrile on arrival in the ED.  Patient was admitted for neutropenic fever and started on empiric antibiotic in the setting of immunocompromise state.  Blood cultures so far negative.  Patient was continued on empiric antibiotics.  He was evaluated by oncologist recommended If patient remain fever free for 48 hours and  blood cultures negative.  Patient can be discharged with outpatient follow-up.  Patient remains afebrile.  Patient wants to be discharged.  Advised patient to continue ciprofloxacin  liquids until follow-up with Dr. Vernita Goodnight.   Discharge Diagnoses:  Principal Problem:   Febrile neutropenia (HCC) Active Problems:   Hyponatremia    Normocytic anemia   S/P percutaneous endoscopic gastrostomy (PEG) tube placement (HCC)   Malignant neoplasm of tonsillar fossa (HCC)   Malignant neoplasm of prostate (HCC)   Hypothyroidism, postsurgical   Hypertension, essential, benign   Hyperlipidemia   Type 2 diabetes mellitus with other specified complication (HCC)   BPH associated with nocturia  Febrile neutropenia [facility]): Patient presented with fever in setting of immunocompromised state.  He has Neutropenia due to right tonsillar cancer on chemotherapy (cisplastin).  Last chemo infusion 08/06/23.  Cycle 5 held yesterday due to neutropenia with ANC of 500.  He was febrile to 101.2 with WBC of 0.9 and ANC of 400 Empirically initiated on vancomycin / Zosyn.  De-escalated to cefepime. Patient has taken 2 doses of Cipro  prior to coming to the hospital. Blood and urine culture No growth so far. CXR clear, port/PEG tube clean and no other signs of infection.  Urine possible source as he states it is darker and some dysuria with sitting while urinating, although this is not new. COVID and RSV, influenza negative. Oncology is consulted, continue IV antibiotics  Chemo will be held until recovery from neutropenic fever. Cycle 5 held yesterday. Xarxio px awaiting PA,  Continue antipyretics.  Blood cultures urine cultures negative so far. Advised to continue Cipro  as prescribed by oncologist. Patient is being discharged home.   Hyponatremia: Near baseline, received IVF in ED. TSH wnl in 05/2023 Trend Serum Sodium.130 > 127 >129   Normocytic anemia Hgb downtrending from 3 weeks ago . Chemo held this week . Serum iron low.  Continue iron supplement. Trend H/H   S/P percutaneous endoscopic gastrostomy (PEG) tube placement [facility]) Site is clean, no signs of infection.  Nutrition consult for continued PEG feedings . Routine PEG tube care .   Malignant neoplasm of tonsillar fossa (HCC) Dx'd in February of 2025 Oral mucositis and has PEG  tube with NPO status  He started cycle 1 of cisplatin  on 07/09/2023.  Plan was to continue cisplatin  weekly during the course of radiation. He began radiation treatments from 07/12/2023. He was seen in onoclogy clinic yesterday for this 5th cycle of chemo but was found to be neutropenic with ANC of 500. Chemo held.  Zarxio support next week for 3 days will be planned, pending authorization.  He was started on liquid cipro -advised to continue Cipro .   Malignant neoplasm of prostate (HCC) Dx'd in March of 2024. S/p radio active seed implant and brachytherapy .   Hypothyroidism, postsurgical TSH wnl in February of 2025 Continue synthroid  88 mcg daily    Hypertension, essential, benign Well controlled.  Hold blood pressure medication.  Due to soft blood pressure PRN meds for now    Hyperlipidemia Continue lipitor 20mg  daily.   Type 2 diabetes mellitus with other specified complication (HCC) A1C of 6.5 in January of 2025 Appears diet controlled  SSI and accuchecks QAC/HS    BPH associated with nocturia Continue cardura  nightly .   Obesity: Diet and exercise discussed in detail:  Discharge Instructions  Discharge Instructions     Call MD for:  difficulty breathing, headache or visual disturbances   Complete by: As directed    Call MD for:  persistant dizziness or light-headedness   Complete by: As directed    Call MD for:  persistant nausea and vomiting   Complete by: As directed    Diet - low sodium heart healthy   Complete by: As directed    Diet Carb Modified   Complete by: As directed    Discharge instructions   Complete by: As directed    Advised to follow-up with primary care physician in 1 week. Advised to follow-up oncologist Dr. Susen Epstein as scheduled. Advised to continue ciprofloxacin  as prescribed.   Increase activity slowly   Complete by: As directed       Allergies as of 08/17/2023       Reactions   Other Other (See Comments)   Tape used when putting in  the port - caused welting   Testosterone Other (See Comments)   headache        Medication List     STOP taking these medications    atorvastatin  20 MG tablet Commonly known as: LIPITOR   lisinopril -hydrochlorothiazide  20-25 MG tablet Commonly known as: ZESTORETIC    sucralfate  1 g tablet Commonly known as: CARAFATE        TAKE these medications    ALPRAZolam  1 MG tablet Commonly known as: XANAX  Take 0.5 tablets (0.5 mg total) by mouth 2 (two) times daily as needed for anxiety.   aspirin 81 MG chewable tablet Chew 81 mg by mouth daily.   Cipro  500 MG/5ML (10%) suspension Generic drug: ciprofloxacin  Take 5 mLs (500 mg total) by mouth 2 (two) times daily. Do NOT administer via tube   dexAMETHasone  Intensol 1 MG/ML solution Generic drug: dexamethasone  TAKE BY MOUTH OR G-TUBE ONCE DAILY FOR THREE DAYS AFTER CHEMO What changed:  how much to take how to take this when to take this additional instructions   doxazosin  1 MG tablet Commonly known as: Cardura  Take 1 tablet (1 mg total) by mouth at bedtime.   esomeprazole  40 MG capsule Commonly known as: NexIUM  Take 1 capsule (  40 mg total) by mouth daily at 12 noon. See instructions to administer through Tube.   fluconazole  40 MG/ML suspension Commonly known as: DIFLUCAN  Take 5mL today, then 2.5mL daily for 20 more days. HOLD ATORVASTATIN  WHILE ON THIS.   HYDROcodone -acetaminophen  7.5-325 mg/15 ml solution Commonly known as: HYCET Take 10 mLs by mouth 4 (four) times daily as needed for moderate pain (pain score 4-6).   levothyroxine  88 MCG tablet Commonly known as: SYNTHROID  TAKE ONE TABLET BY MOUTH DAILY.   lidocaine  2 % solution Commonly known as: XYLOCAINE  Patient: Mix 1part 2% viscous lidocaine , 1part water . Swish and spit 10mL of diluted mixture every 2 hours as needed.   lidocaine -prilocaine  cream Commonly known as: EMLA  Apply to affected area once   metoCLOPramide  10 MG/10ML Soln Commonly known  as: REGLAN  Place 10 mLs (10 mg total) into feeding tube 3 (three) times daily before meals.   Nutren 1.5 Liqd 2 cartons Nutren 1.5 (500 ml) QID via tube. Flush with 60 ml water  before/after each bolus. Provide additional 250 ml water  flush 4x/day in between feedings to meet hydration needs. Provides 3000 kcal, 136 g protein, 1528 ml free water  (3008 ml total water ) 2000 ml/day meets 100% DRI   ondansetron  8 MG disintegrating tablet Commonly known as: ZOFRAN -ODT Take 1 tablet (8 mg total) by mouth every 8 (eight) hours as needed for nausea or vomiting.   prochlorperazine  10 MG tablet Commonly known as: COMPAZINE  Take 1 tablet (10 mg total) by mouth every 6 (six) hours as needed (Nausea or vomiting).   solifenacin 5 MG tablet Commonly known as: VESICARE Take 5 mg by mouth daily.         Follow-up Information     [name], [name], MD Follow up in 1 day(s).   Specialty: Family Medicine Contact information: [address] [place] [address] [phone]         Arlo Berber, MD Follow up in 1 week(s).   Specialty: Oncology Contact information: [address] [place] [address] [phone]                Allergies  Allergen Reactions   Other Other (See Comments)    Tape used when putting in the port - caused welting   Testosterone Other (See Comments)    headache    Consultations: Oncology   Procedures/Studies: DG Chest Port 1 View Result Date: 08/14/2023 CLINICAL DATA:  Possible sepsis and fevers EXAM: PORTABLE CHEST 1 VIEW COMPARISON:  08/04/2018 FINDINGS: Cardiac shadow is within normal limits somewhat accentuated by the portable technique. Right chest wall port is noted. Lungs are well aerated bilaterally. No focal infiltrate or effusion is seen. No bony abnormality is noted. IMPRESSION: No active disease. Electronically Signed   By: Violeta Grey M.D.   On: 08/14/2023 14:54     Subjective: Patient seen and examined at bedside.   Overnight events noted. Patient remained fever free.  Cultures so far negative.  Patient wants to be discharged.  Discharge Exam: Vitals:   08/17/23 0613 08/17/23 1222  BP:  (!) 147/67  Pulse:  (!) 103  Resp: 12 18  Temp:  98.3 F (36.8 C)  SpO2:  99%   Vitals:   08/17/23 0455 08/17/23 0549 08/17/23 0613 08/17/23 1222  BP:    (!) 147/67  Pulse:    (!) 103  Resp:   12 18  Temp:  100 F (37.8 C)  98.3 F (36.8 C)  TempSrc:  Axillary  Oral  SpO2:  99%  Weight: 120.7 kg     Height:        General: Pt is alert, awake, not in acute distress Cardiovascular: RRR, S1/S2 +, no rubs, no gallops Respiratory: CTA bilaterally, no wheezing, no rhonchi Abdominal: Soft, NT, ND, bowel sounds + Extremities: no edema, no cyanosis    The results of significant diagnostics from this hospitalization (including imaging, microbiology, ancillary and laboratory) are listed below for reference.     Microbiology: Recent Results (from the past 240 hours)  Blood Culture (routine x 2)     Status: None (Preliminary result)   Collection Time: 08/14/23  1:51 PM   Specimen: BLOOD  Result Value Ref Range Status   Specimen Description   Final    BLOOD PORTA CATH Performed at [facility], [address] [place], [place] [address]    Special Requests   Final    BOTTLES DRAWN AEROBIC AND ANAEROBIC Blood Culture adequate volume Performed at [facility], [address], [place], [place] [address]    Culture   Final    NO GROWTH 3 DAYS Performed at [facility], [address], [place], [place] [address]    Report Status PENDING  Incomplete  Resp panel by RT-PCR (RSV, Flu A&B, Covid) Anterior Nasal Swab     Status: None   Collection Time: 08/14/23  3:17 PM   Specimen: Anterior Nasal Swab  Result Value Ref Range Status   SARS Coronavirus 2 by RT PCR NEGATIVE NEGATIVE Final    Comment: (NOTE) SARS-CoV-2 target nucleic acids are NOT DETECTED.  The  SARS-CoV-2 RNA is generally detectable in upper respiratory specimens during the acute phase of infection. The lowest concentration of SARS-CoV-2 viral copies this assay can detect is 138 copies/mL. A negative result does not preclude SARS-Cov-2 infection and should not be used as the sole basis for treatment or other patient management decisions. A negative result may occur with  improper specimen collection/handling, submission of specimen other than nasopharyngeal swab, presence of viral mutation(s) within the areas targeted by this assay, and inadequate number of viral copies(<138 copies/mL). A negative result must be combined with clinical observations, patient history, and epidemiological information. The expected result is Negative.  Fact Sheet for Patients:  BloggerCourse.com  Fact Sheet for Healthcare Providers:  SeriousBroker.it  This test is no t yet approved or cleared by the [place]  FDA and  has been authorized for detection and/or diagnosis of SARS-CoV-2 by FDA under an Emergency Use Authorization (EUA). This EUA will remain  in effect (meaning this test can be used) for the duration of the COVID-19 declaration under Section 564(b)(1) of the Act, 21 U.S.C.section 360bbb-3(b)(1), unless the authorization is terminated  or revoked sooner.       Influenza A by PCR NEGATIVE NEGATIVE Final   Influenza B by PCR NEGATIVE NEGATIVE Final    Comment: (NOTE) The Xpert Xpress SARS-CoV-2/FLU/RSV plus assay is intended as an aid in the diagnosis of influenza from Nasopharyngeal swab specimens and should not be used as a sole basis for treatment. Nasal washings and aspirates are unacceptable for Xpert Xpress SARS-CoV-2/FLU/RSV testing.  Fact Sheet for Patients: BloggerCourse.com  Fact Sheet for Healthcare Providers: SeriousBroker.it  This test is not yet approved or  cleared by the [place]  FDA and has been authorized for detection and/or diagnosis of SARS-CoV-2 by FDA under an Emergency Use Authorization (EUA). This EUA will remain in effect (meaning this test can be used) for the  duration of the COVID-19 declaration under Section 564(b)(1) of the Act, 21 U.S.C. section 360bbb-3(b)(1), unless the authorization is terminated or revoked.     Resp Syncytial Virus by PCR NEGATIVE NEGATIVE Final    Comment: (NOTE) Fact Sheet for Patients: BloggerCourse.com  Fact Sheet for Healthcare Providers: SeriousBroker.it  This test is not yet approved or cleared by the [place]  FDA and has been authorized for detection and/or diagnosis of SARS-CoV-2 by FDA under an Emergency Use Authorization (EUA). This EUA will remain in effect (meaning this test can be used) for the duration of the COVID-19 declaration under Section 564(b)(1) of the Act, 21 U.S.C. section 360bbb-3(b)(1), unless the authorization is terminated or revoked.  Performed at [facility], [address], [place], [place] [address]   Urine Culture (for pregnant, neutropenic or urologic patients or patients with an indwelling urinary catheter)     Status: None   Collection Time: 08/14/23  4:55 PM   Specimen: Urine, Clean Catch  Result Value Ref Range Status   Specimen Description   Final    URINE, CLEAN CATCH Performed at [facility], [address], [place], [place] [address]    Special Requests   Final    NONE Performed at [facility], [address], [place], [place] [address]    Culture   Final    NO GROWTH Performed at [facility], [address], [place], [place] [address]    Report Status 08/15/2023 FINAL  Final  Blood Culture (routine x 2)     Status: None (Preliminary result)   Collection Time: 08/14/23  6:00 PM   Specimen: BLOOD LEFT HAND  Result Value  Ref Range Status   Specimen Description   Final    BLOOD LEFT HAND Performed at [facility], [address] [place], [place] [address]    Special Requests   Final    BOTTLES DRAWN AEROBIC ONLY Blood Culture results may not be optimal due to an inadequate volume of blood received in culture bottles Performed at [facility], [address], [place], [place] [address]    Culture   Final    NO GROWTH 3 DAYS Performed at [facility], [address] [place], [place] [address]    Report Status PENDING  Incomplete     Labs: BNP (last 3 results) No results for input(s): "BNP" in the last 8760 hours. Basic Metabolic Panel: Recent Labs  Lab 08/13/23 0823 08/14/23 1357 08/15/23 0433 08/16/23 0341 08/17/23 0335  NA 131* 129* 130* 127* 129*  K 4.0 3.7 3.7 3.6 3.8  CL 93* 94* 96* 92* 94*  CO2 31 26 27 25 27   GLUCOSE 142* 187* 196* 186* 182*  BUN 17 20 17 16 22   CREATININE 0.55* 0.62 0.58* 0.42* 0.35*  CALCIUM  7.9* 7.5* 7.1* 7.0* 7.1*  MG 1.2*  --   --  1.4* 1.4*  PHOS  --   --   --  2.4* 2.8   Liver Function Tests: Recent Labs  Lab 08/14/23 1357  AST 34  ALT 34  ALKPHOS 86  BILITOT 1.3*  PROT 5.2*  ALBUMIN 2.4*   No results for input(s): "LIPASE", "AMYLASE" in the last 168 hours. No results for input(s): "AMMONIA" in the last 168 hours. CBC: Recent Labs  Lab 08/13/23 0823 08/14/23 1357 08/15/23 0433 08/16/23 0341 08/17/23 0335  WBC 0.7* 0.9* 0.7* 0.9* 1.3*  NEUTROABS 0.5* 0.4* 0.4* 0.6* 0.9*  HGB 11.0* 9.9* 8.9* 8.8* 9.2*  HCT 31.7* 29.8* 27.6* 26.5* 26.9*  MCV 89.3 93.7 95.5 93.3 92.8  PLT 78* 77* 67* 80* 86*   Cardiac Enzymes: No results for input(s): "CKTOTAL", "CKMB", "CKMBINDEX", "TROPONINI" in the last 168 hours. BNP: Invalid input(s): "POCBNP" CBG: Recent Labs  Lab 08/17/23 0012 08/17/23 0415 08/17/23 0749 08/17/23 1134 08/17/23 1616  GLUCAP 186* 156* 180* 184* 200*   D-Dimer No results for input(s): "DDIMER"  in the last 72 hours. Hgb A1c No results for input(s): "HGBA1C" in the last 72 hours. Lipid Profile No results for input(s): "CHOL", "HDL", "LDLCALC", "TRIG", "CHOLHDL", "LDLDIRECT" in the last 72 hours. Thyroid  function studies No results for input(s): "TSH", "T4TOTAL", "T3FREE", "THYROIDAB" in the last 72 hours.  Invalid input(s): "FREET3" Anemia work up Recent Labs    08/14/23 1800  VITAMINB12 501  FERRITIN 1,049*  TIBC 123*  IRON 29*   Urinalysis    Component Value Date/Time   COLORURINE AMBER (A) 08/14/2023 1655   APPEARANCEUR CLEAR 08/14/2023 1655   LABSPEC 1.023 08/14/2023 1655   PHURINE 5.0 08/14/2023 1655   GLUCOSEU 150 (A) 08/14/2023 1655   HGBUR NEGATIVE 08/14/2023 1655   BILIRUBINUR NEGATIVE 08/14/2023 1655   KETONESUR NEGATIVE 08/14/2023 1655   PROTEINUR 30 (A) 08/14/2023 1655   NITRITE NEGATIVE 08/14/2023 1655   LEUKOCYTESUR NEGATIVE 08/14/2023 1655   Sepsis Labs Recent Labs  Lab 08/14/23 1357 08/15/23 0433 08/16/23 0341 08/17/23 0335  WBC 0.9* 0.7* 0.9* 1.3*   Microbiology Recent Results (from the past 240 hours)  Blood Culture (routine x 2)     Status: None (Preliminary result)   Collection Time: 08/14/23  1:51 PM   Specimen: BLOOD  Result Value Ref Range Status   Specimen Description   Final    BLOOD PORTA CATH Performed at [facility], [address], [place], [place] [address]    Special Requests   Final    BOTTLES DRAWN AEROBIC AND ANAEROBIC Blood Culture adequate volume Performed at [facility], [address], [place], [place] [address]    Culture   Final    NO GROWTH 3 DAYS Performed at [facility], [address] [place], [place] [address]    Report Status PENDING  Incomplete  Resp panel by RT-PCR (RSV, Flu A&B, Covid) Anterior Nasal Swab     Status: None   Collection Time: 08/14/23  3:17 PM   Specimen: Anterior Nasal Swab  Result Value Ref Range Status   SARS Coronavirus 2 by RT  PCR NEGATIVE NEGATIVE Final    Comment: (NOTE) SARS-CoV-2 target nucleic acids are NOT DETECTED.  The SARS-CoV-2 RNA is generally detectable in upper respiratory specimens during the acute phase of infection. The lowest concentration of SARS-CoV-2 viral copies this assay can detect is 138 copies/mL. A negative result does not preclude SARS-Cov-2 infection and should not be used as the sole basis for treatment or other patient management decisions. A negative result may occur with  improper specimen collection/handling, submission of specimen other than nasopharyngeal swab, presence of viral mutation(s) within the areas targeted by this assay, and inadequate number of viral copies(<138 copies/mL). A negative result must be combined with clinical observations, patient history, and epidemiological information. The expected result is Negative.  Fact Sheet for Patients:  BloggerCourse.com  Fact Sheet for Healthcare Providers:  SeriousBroker.it  This test is no t yet approved or cleared by the [place]  FDA and  has been authorized for detection and/or  diagnosis of SARS-CoV-2 by FDA under an Emergency Use Authorization (EUA). This EUA will remain  in effect (meaning this test can be used) for the duration of the COVID-19 declaration under Section 564(b)(1) of the Act, 21 U.S.C.section 360bbb-3(b)(1), unless the authorization is terminated  or revoked sooner.       Influenza A by PCR NEGATIVE NEGATIVE Final   Influenza B by PCR NEGATIVE NEGATIVE Final    Comment: (NOTE) The Xpert Xpress SARS-CoV-2/FLU/RSV plus assay is intended as an aid in the diagnosis of influenza from Nasopharyngeal swab specimens and should not be used as a sole basis for treatment. Nasal washings and aspirates are unacceptable for Xpert Xpress SARS-CoV-2/FLU/RSV testing.  Fact Sheet for Patients: BloggerCourse.com  Fact Sheet for  Healthcare Providers: SeriousBroker.it  This test is not yet approved or cleared by the [place]  FDA and has been authorized for detection and/or diagnosis of SARS-CoV-2 by FDA under an Emergency Use Authorization (EUA). This EUA will remain in effect (meaning this test can be used) for the duration of the COVID-19 declaration under Section 564(b)(1) of the Act, 21 U.S.C. section 360bbb-3(b)(1), unless the authorization is terminated or revoked.     Resp Syncytial Virus by PCR NEGATIVE NEGATIVE Final    Comment: (NOTE) Fact Sheet for Patients: BloggerCourse.com  Fact Sheet for Healthcare Providers: SeriousBroker.it  This test is not yet approved or cleared by the [place]  FDA and has been authorized for detection and/or diagnosis of SARS-CoV-2 by FDA under an Emergency Use Authorization (EUA). This EUA will remain in effect (meaning this test can be used) for the duration of the COVID-19 declaration under Section 564(b)(1) of the Act, 21 U.S.C. section 360bbb-3(b)(1), unless the authorization is terminated or revoked.  Performed at [facility], [address] [place], [place] [address]   Urine Culture (for pregnant, neutropenic or urologic patients or patients with an indwelling urinary catheter)     Status: None   Collection Time: 08/14/23  4:55 PM   Specimen: Urine, Clean Catch  Result Value Ref Range Status   Specimen Description   Final    URINE, CLEAN CATCH Performed at [facility], [address] [place], [place] [address]    Special Requests   Final    NONE Performed at [facility], [address], [place], [place] [address]    Culture   Final    NO GROWTH Performed at [facility], [address], [place], [place] [address]    Report Status 08/15/2023 FINAL  Final  Blood Culture (routine x 2)     Status: None  (Preliminary result)   Collection Time: 08/14/23  6:00 PM   Specimen: BLOOD LEFT HAND  Result Value Ref Range Status   Specimen Description   Final    BLOOD LEFT HAND Performed at [facility], [address] [place], [place] [address]    Special Requests   Final    BOTTLES DRAWN AEROBIC ONLY Blood Culture results may not be optimal due to an inadequate volume of blood received in culture bottles Performed at [facility], [address], [place], [place] [address]    Culture   Final    NO GROWTH 3 DAYS Performed at [facility], [address], [place], [place] [address]    Report Status PENDING  Incomplete     Time coordinating discharge: Over 30 minutes  SIGNED:   Magdalene School, MD  [facility] 08/17/2023, 4:23 PM Pager   If 7PM-7AM, please contact night-coverage

## 2023-08-17 NOTE — TOC Transition Note (Signed)
 Transition of Care [facility]) - Discharge Note   Patient Details  Name: Isaac Carpenter MRN: [id number] Date of Birth: Dec 28, 1950  Transition of Care [facility]) CM/SW Contact:  Ruben Corolla, RN Phone Number: 08/17/2023, 3:49 PM   Clinical Narrative:   d/c home no CM needs.    Final next level of care: Home/Self Care Barriers to Discharge: No Barriers Identified   Patient Goals and CMS Choice            Discharge Placement                       Discharge Plan and Services Additional resources added to the After Visit Summary for                                       Social Drivers of Health (SDOH) Interventions SDOH Screenings   Food Insecurity: No Food Insecurity (08/14/2023)  Housing: Low Risk  (08/14/2023)  Transportation Needs: No Transportation Needs (08/14/2023)  Utilities: Not At Risk (08/14/2023)  Alcohol Screen: Low Risk  (07/17/2022)  Depression (PHQ2-9): Low Risk  (06/17/2023)  Financial Resource Strain: Low Risk  (10/08/2021)  Physical Activity: Inactive (10/08/2021)  Social Connections: Moderately Isolated (08/14/2023)  Stress: No Stress Concern Present (10/08/2021)  Tobacco Use: High Risk (08/14/2023)     Readmission Risk Interventions     No data to display

## 2023-08-17 NOTE — Discharge Instructions (Signed)
 Advised to follow-up oncologist Dr. Vernita Goodnight as scheduled. Advised to continue ciprofloxacin  as prescribed.

## 2023-08-17 NOTE — Plan of Care (Signed)
  Problem: Metabolic: Goal: Ability to maintain appropriate glucose levels will improve Outcome: Progressing   Problem: Nutritional: Goal: Maintenance of adequate nutrition will improve Outcome: Progressing   Problem: Clinical Measurements: Goal: Respiratory complications will improve Outcome: Progressing Goal: Cardiovascular complication will be avoided Outcome: Progressing   Problem: Activity: Goal: Risk for activity intolerance will decrease Outcome: Progressing   Problem: Nutrition: Goal: Adequate nutrition will be maintained Outcome: Progressing   Problem: Elimination: Goal: Will not experience complications related to bowel motility Outcome: Progressing Goal: Will not experience complications related to urinary retention Outcome: Progressing   Problem: Pain Managment: Goal: General experience of comfort will improve and/or be controlled Outcome: Progressing   Problem: Safety: Goal: Ability to remain free from injury will improve Outcome: Progressing

## 2023-08-18 ENCOUNTER — Other Ambulatory Visit: Payer: Self-pay

## 2023-08-18 ENCOUNTER — Telehealth: Payer: Self-pay

## 2023-08-18 ENCOUNTER — Encounter: Payer: Self-pay | Admitting: General Practice

## 2023-08-18 ENCOUNTER — Telehealth: Payer: Self-pay | Admitting: Dietician

## 2023-08-18 ENCOUNTER — Ambulatory Visit

## 2023-08-18 ENCOUNTER — Other Ambulatory Visit: Payer: Self-pay | Admitting: Oncology

## 2023-08-18 DIAGNOSIS — C090 Malignant neoplasm of tonsillar fossa: Secondary | ICD-10-CM

## 2023-08-18 NOTE — Progress Notes (Signed)
 [facility] Spiritual Care Note  Referred by nursing for additional layer of support. Reached Isaac Carpenter and his wife Isaac Carpenter by phone, introducing Spiritual Care as part of their support team and ensuring that they have chaplain's direct number and are aware of ongoing chaplain availability. Also routed their medical questions directly to Dr Lurena Sally and Al Hover for follow-up support.  Family plans to reach out as needed/desired for additional support.   [address] [place]ice [place], [place], [facility] Pager [phone] Voicemail [phone]

## 2023-08-18 NOTE — Progress Notes (Signed)
 Oncology Nurse Navigator Documentation   I called Isaac Carpenter wife Isaac Carpenter today to discuss several things and his upcoming appointments. She called me to request a home suction machine and I let her know that an order has been placed by Dr. Lurena Carpenter for a home health company to deliver it to their home. We also discussed his appointment with Isaac Carpenter SLP tomorrow for continued assessment of his swallowing. He is declining to come to the appointment due to his radiation being cancelled this week to allow healing. He requested to be scheduled with Isaac Carpenter next week but Isaac Carpenter does not have an opening until 5/13. I will most likely get him scheduled during the next MDC on 5/15 after his regular radiation appointment. We also discussed that he needs to come to his appointments scheduled with Dr. Randye Carpenter and labs on Friday 5/2. He will not receive chemotherapy but may get IVFs if necessary. I also discussed the possibility of him receiving IVFs next week with Dr. Randye Carpenter after the patient requested them. Dr. Randye Carpenter will discuss all his concerns with him on 5/2. Isaac Carpenter and Isaac Carpenter have my phone number and know to call me if I can help with any questions or concerns.  Aaron Aasjm

## 2023-08-18 NOTE — Telephone Encounter (Signed)
 Placed call to patient s/p recent hospital discharge.   Patient admitted 4/26-4/29 febrile neutropenia  Spoke with wife of pt via telephone. They are glad to be home. Patient has decided he does not want to do anymore chemo. He will complete radiation and requested supportive IVF a few days/week. Wife reports this was communicated to nurse navigator.   Wife reports pt mouth is "pretty bad." Says it is raw and bloody. Pt is taking antibiotics as prescribed. Suction machine has been ordered per in-basket message.   Reports feedings are going okay. Doing 1 1/2 cartons of Nutren 1.5 with 60 ml water  flush before and after QID. Wife has ordered protein modular. This is scheduled to arrive 5/6.   Intervention Continue 1 1/2 cartons Nutren 1.5 QID - will add protein modular once received    Will plan to see patient as scheduled on 5/2

## 2023-08-18 NOTE — Transitions of Care (Post Inpatient/ED Visit) (Signed)
   08/18/2023  Name: Isaac Carpenter MRN: [id number] DOB: [birth year]  Today's TOC FU Call Status: Today's TOC FU Call Status:: Unsuccessful Call (1st Attempt) Unsuccessful Call (1st Attempt) Date: 08/18/23 (Was on call or just received call from Spiritual Care Chaplain)  Attempted to reach the patient regarding the most recent Inpatient/ED visit. Was on a call or just received a call referral from spiritual care chaplain.   Follow Up Plan: Additional outreach attempts will be made to reach the patient to complete the Transitions of Care (Post Inpatient/ED visit) call.   Katheryn Pandy MSN, RN RN Case Sales executive Health  [facility]Population Health Office Hours Wed/Thur  8:00 am-6:00 pm Direct Dial: [phone] Main Phone [phone]  Fax: [phone] [email or web address]

## 2023-08-19 ENCOUNTER — Ambulatory Visit

## 2023-08-19 ENCOUNTER — Telehealth: Payer: Self-pay | Admitting: *Deleted

## 2023-08-19 LAB — CULTURE, BLOOD (ROUTINE X 2)
Culture: NO GROWTH
Special Requests: ADEQUATE

## 2023-08-19 MED FILL — Fosaprepitant Dimeglumine For IV Infusion 150 MG (Base Eq): INTRAVENOUS | Qty: 5 | Status: AC

## 2023-08-19 NOTE — Transitions of Care (Post Inpatient/ED Visit) (Signed)
 08/19/2023  Name: Isaac Carpenter MRN: [id number] DOB: Feb 11, 1951  Today's TOC FU Call Status: Today's TOC FU Call Status:: Successful TOC FU Call Completed TOC FU Call Complete Date: 08/19/23 Patient's Name and Date of Birth confirmed.  Transition Care Management Follow-up Telephone Call Date of Discharge: 08/17/23 Discharge Facility: [facility]) Type of Discharge: Inpatient Admission Primary Inpatient Discharge Diagnosis:: Febrile Neutropenia How have you been since you were released from the hospital?:  (no issues with bowel/ bladder, pt is NPO, Peg tube patent and flushing well, pt followed heavily by cancer center, has 2 more radiation treatments, refuses any more chemo, pt has suction machine, walker, spouse supportive) Any questions or concerns?: No  Items Reviewed: Did you receive and understand the discharge instructions provided?: Yes Any new allergies since your discharge?: No Dietary orders reviewed?: Yes Type of Diet Ordered:: NPO   1.5 cartons Nutren with 60ml flush before and after  QID Do you have support at home?: Yes People in Home [RPT]: spouse Name of Support/Comfort Primary Source: [name] Reviewed signs/ symptoms infection, reportable signs/ symptoms Patient/ spouse decline enrollment in TOC 30 day program due to have a lot of other appointments and heavily followed by cancer center for any needs   Medications Reviewed Today: Medications Reviewed Today     Reviewed by Daralyn Earl, RN (Registered Nurse) on 08/19/23 at 1551  Med List Status: <None>   Medication Order Taking? Sig Documenting Provider Last Dose Status Informant  ALPRAZolam  (XANAX ) 1 MG tablet [id number] Yes Take 0.5 tablets (0.5 mg total) by mouth 2 (two) times daily as needed for anxiety. Arlo Berber, MD Taking Active Spouse/Significant Other, Pharmacy Records           Med Note [facility], KAITLYN M   Thu Aug 05, 2023 11:58 AM) Through tube  aspirin 81 MG chewable tablet [id number] Yes  Chew 81 mg by mouth daily. [provider] Taking Active Spouse/Significant Other, Pharmacy Records           Med Note Silvestre Drum, TYELISHA Rolena Click   Sat Aug 14, 2023  6:39 PM) Through tube  ciprofloxacin  (CIPRO ) 500 MG/5ML (10%) suspension [id number] Yes Take 5 mLs (500 mg total) by mouth 2 (two) times daily. Do NOT administer via tube Pasam, Avinash, MD Taking Active Spouse/Significant Other, Pharmacy Records  dexamethasone  (DEXAMETHASONE  INTENSOL) 1 MG/ML solution [id number] No TAKE BY MOUTH OR G-TUBE ONCE DAILY FOR THREE DAYS AFTER CHEMO  Patient not taking: Reported on 08/19/2023   Arlo Berber, MD Not Taking Active Spouse/Significant Other, Pharmacy Records  doxazosin  (CARDURA ) 1 MG tablet [id number] Yes Take 1 tablet (1 mg total) by mouth at bedtime. Arlo Berber, MD Taking Active Spouse/Significant Other, Pharmacy Records           Med Note [facility], KAITLYN M   Thu Aug 05, 2023 12:00 PM) Taking through Tube  esomeprazole  (NEXIUM ) 40 MG capsule [id number] Yes Take 1 capsule (40 mg total) by mouth daily at 12 noon. See instructions to administer through Tube. Thayil, Irene T, PA-C Taking Active Spouse/Significant Other, Pharmacy Records  fluconazole  (DIFLUCAN ) 40 MG/ML suspension [id number] Yes Take 5mL today, then 2.5mL daily for 20 more days. HOLD ATORVASTATIN  WHILE ON THIS. Colie Dawes, MD Taking Active Spouse/Significant Other, Pharmacy Records  HYDROcodone -acetaminophen  (HYCET) 7.5-325 mg/15 ml solution [id number] Yes Take 10 mLs by mouth 4 (four) times daily as needed for moderate pain (pain score 4-6). Arlo Berber, MD Taking Active Spouse/Significant Other, Pharmacy Records  levothyroxine  (SYNTHROID ) 88 MCG tablet  [id number] Yes TAKE ONE TABLET BY MOUTH DAILY. [name], [name], MD Taking Active Spouse/Significant Other, Pharmacy Records           Med Note [facility], KAITLYN M   Thu Aug 05, 2023 12:02 PM) Through tube  lidocaine  (XYLOCAINE ) 2 % solution [id number] Yes Patient: Mix  1part 2% viscous lidocaine , 1part water . Swish and spit 10mL of diluted mixture every 2 hours as needed. Colie Dawes, MD Taking Active Spouse/Significant Other, Pharmacy Records  lidocaine -prilocaine  (EMLA ) cream [id number] Yes Apply to affected area once Pasam, Avinash, MD Taking Active Spouse/Significant Other, Pharmacy Records  metoCLOPramide  (REGLAN ) 10 MG/10ML SOLN [id number] Yes Place 10 mLs (10 mg total) into feeding tube 3 (three) times daily before meals. Arlo Berber, MD Taking Active Spouse/Significant Other, Pharmacy Records  Nutritional Supplements (NUTREN 1.5) LIQD [id number] Yes 2 cartons Nutren 1.5 (500 ml) QID via tube. Flush with 60 ml water  before/after each bolus. Provide additional 250 ml water  flush 4x/day in between feedings to meet hydration needs. Provides 3000 kcal, 136 g protein, 1528 ml free water  (3008 ml total water ) 2000 ml/day meets 100% DRI Colie Dawes, MD Taking Active Spouse/Significant Other, Pharmacy Records  ondansetron  (ZOFRAN -ODT) 8 MG disintegrating tablet [id number] No Take 1 tablet (8 mg total) by mouth every 8 (eight) hours as needed for nausea or vomiting.  Patient not taking: Reported on 08/19/2023   Arlo Berber, MD Not Taking Active Spouse/Significant Other, Pharmacy Records           Med Note Karolynn Pack   Sat Aug 14, 2023  6:53 PM) Patients wife stated he has not needed this since getting the medication via IV. But she was afraid is would effect him negatively due to the radiation burns on his tongue.  prochlorperazine  (COMPAZINE ) 10 MG tablet [id number] No Take 1 tablet (10 mg total) by mouth every 6 (six) hours as needed (Nausea or vomiting).  Patient not taking: Reported on 08/19/2023   Arlo Berber, MD Not Taking Active Spouse/Significant Other, Pharmacy Records  solifenacin (VESICARE) 5 MG tablet [id number] Yes Take 5 mg by mouth daily. [provider] Taking Active Spouse/Significant Other, Pharmacy Records           Med Note  [facility], KAITLYN M   Thu Aug 05, 2023 11:59 AM) Through tube            Home Care and Equipment/Supplies: Were Home Health Services Ordered?: No Any new equipment or medical supplies ordered?: Yes Name of Medical supply agency?: suction machine,  spouse states they delivered today and she was backing out of the driveway when they delivered, states if she has any issues, she will contact DME company, does not know the name of company at present, does not have suction machine out of the box yet Were you able to get the equipment/medical supplies?: Yes Do you have any questions related to the use of the equipment/supplies?: No  Functional Questionnaire: Do you need assistance with bathing/showering or dressing?: No Do you need assistance with meal preparation?: No Do you need assistance with eating?: No Do you have difficulty maintaining continence: No Do you need assistance with getting out of bed/getting out of a chair/moving?: Yes (has walker) Do you have difficulty managing or taking your medications?: Yes (spouse provides oversight)  Follow up appointments reviewed: PCP Follow-up appointment confirmed?:  (spouse states not going to schedule appointment now due to having alot of other appointments including oncology, will have labwork 5/2 at oncology appointment) MD Provider Line Number:[phone]  Given: No Specialist Hospital Follow-up appointment confirmed?: Yes Date of Specialist follow-up appointment?: 08/20/23 Follow-Up Specialty Provider:: Dr. Randye Buttner @ 830 am Do you need transportation to your follow-up appointment?: No Do you understand care options if your condition(s) worsen?: Yes-patient verbalized understanding  SDOH Interventions Today    Flowsheet Row Most Recent Value  SDOH Interventions   Food Insecurity Interventions Intervention Not Indicated  Housing Interventions Intervention Not Indicated  Transportation Interventions Intervention Not Indicated   Utilities Interventions Intervention Not Indicated       Cecilie Coffee [facility], BSN RN Care Manager/ Transition of Care [place] Population Health [phone]

## 2023-08-20 ENCOUNTER — Ambulatory Visit

## 2023-08-20 ENCOUNTER — Inpatient Hospital Stay

## 2023-08-20 ENCOUNTER — Telehealth: Payer: Self-pay | Admitting: Oncology

## 2023-08-20 ENCOUNTER — Inpatient Hospital Stay (HOSPITAL_BASED_OUTPATIENT_CLINIC_OR_DEPARTMENT_OTHER): Admitting: Oncology

## 2023-08-20 ENCOUNTER — Other Ambulatory Visit: Payer: Self-pay

## 2023-08-20 ENCOUNTER — Inpatient Hospital Stay: Attending: Nurse Practitioner | Admitting: Dietician

## 2023-08-20 ENCOUNTER — Encounter: Payer: Self-pay | Admitting: Oncology

## 2023-08-20 ENCOUNTER — Ambulatory Visit: Admission: RE | Admit: 2023-08-20 | Source: Ambulatory Visit

## 2023-08-20 VITALS — BP 111/76 | HR 114 | Temp 97.2°F | Resp 17 | Wt 262.0 lb

## 2023-08-20 DIAGNOSIS — C090 Malignant neoplasm of tonsillar fossa: Secondary | ICD-10-CM

## 2023-08-20 DIAGNOSIS — Z95828 Presence of other vascular implants and grafts: Secondary | ICD-10-CM | POA: Diagnosis not present

## 2023-08-20 DIAGNOSIS — C61 Malignant neoplasm of prostate: Secondary | ICD-10-CM | POA: Diagnosis not present

## 2023-08-20 DIAGNOSIS — R5383 Other fatigue: Secondary | ICD-10-CM | POA: Insufficient documentation

## 2023-08-20 DIAGNOSIS — Z923 Personal history of irradiation: Secondary | ICD-10-CM | POA: Insufficient documentation

## 2023-08-20 DIAGNOSIS — I10 Essential (primary) hypertension: Secondary | ICD-10-CM | POA: Insufficient documentation

## 2023-08-20 DIAGNOSIS — R351 Nocturia: Secondary | ICD-10-CM | POA: Insufficient documentation

## 2023-08-20 DIAGNOSIS — Z931 Gastrostomy status: Secondary | ICD-10-CM | POA: Diagnosis not present

## 2023-08-20 DIAGNOSIS — R1310 Dysphagia, unspecified: Secondary | ICD-10-CM | POA: Diagnosis not present

## 2023-08-20 DIAGNOSIS — Z510 Encounter for antineoplastic radiation therapy: Secondary | ICD-10-CM | POA: Diagnosis not present

## 2023-08-20 DIAGNOSIS — M546 Pain in thoracic spine: Secondary | ICD-10-CM | POA: Diagnosis not present

## 2023-08-20 DIAGNOSIS — G479 Sleep disorder, unspecified: Secondary | ICD-10-CM | POA: Insufficient documentation

## 2023-08-20 DIAGNOSIS — Z79891 Long term (current) use of opiate analgesic: Secondary | ICD-10-CM | POA: Diagnosis not present

## 2023-08-20 DIAGNOSIS — F419 Anxiety disorder, unspecified: Secondary | ICD-10-CM | POA: Diagnosis not present

## 2023-08-20 DIAGNOSIS — Z79899 Other long term (current) drug therapy: Secondary | ICD-10-CM | POA: Diagnosis not present

## 2023-08-20 DIAGNOSIS — R634 Abnormal weight loss: Secondary | ICD-10-CM | POA: Insufficient documentation

## 2023-08-20 DIAGNOSIS — G893 Neoplasm related pain (acute) (chronic): Secondary | ICD-10-CM | POA: Diagnosis not present

## 2023-08-20 DIAGNOSIS — Z8546 Personal history of malignant neoplasm of prostate: Secondary | ICD-10-CM | POA: Diagnosis not present

## 2023-08-20 DIAGNOSIS — Y842 Radiological procedure and radiotherapy as the cause of abnormal reaction of the patient, or of later complication, without mention of misadventure at the time of the procedure: Secondary | ICD-10-CM | POA: Insufficient documentation

## 2023-08-20 DIAGNOSIS — Z87891 Personal history of nicotine dependence: Secondary | ICD-10-CM | POA: Diagnosis not present

## 2023-08-20 DIAGNOSIS — K1233 Oral mucositis (ulcerative) due to radiation: Secondary | ICD-10-CM | POA: Insufficient documentation

## 2023-08-20 DIAGNOSIS — Z5111 Encounter for antineoplastic chemotherapy: Secondary | ICD-10-CM | POA: Diagnosis not present

## 2023-08-20 DIAGNOSIS — Z7982 Long term (current) use of aspirin: Secondary | ICD-10-CM | POA: Diagnosis not present

## 2023-08-20 LAB — CBC WITH DIFFERENTIAL (CANCER CENTER ONLY)
Abs Immature Granulocytes: 0.1 10*3/uL — ABNORMAL HIGH (ref 0.00–0.07)
Basophils Absolute: 0 10*3/uL (ref 0.0–0.1)
Basophils Relative: 1 %
Eosinophils Absolute: 0 10*3/uL (ref 0.0–0.5)
Eosinophils Relative: 1 %
HCT: 27.4 % — ABNORMAL LOW (ref 39.0–52.0)
Hemoglobin: 9.5 g/dL — ABNORMAL LOW (ref 13.0–17.0)
Immature Granulocytes: 5 %
Lymphocytes Relative: 35 %
Lymphs Abs: 0.7 10*3/uL (ref 0.7–4.0)
MCH: 31.3 pg (ref 26.0–34.0)
MCHC: 34.7 g/dL (ref 30.0–36.0)
MCV: 90.1 fL (ref 80.0–100.0)
Monocytes Absolute: 0.2 10*3/uL (ref 0.1–1.0)
Monocytes Relative: 11 %
Neutro Abs: 1 10*3/uL — ABNORMAL LOW (ref 1.7–7.7)
Neutrophils Relative %: 47 %
Platelet Count: 120 10*3/uL — ABNORMAL LOW (ref 150–400)
RBC: 3.04 MIL/uL — ABNORMAL LOW (ref 4.22–5.81)
RDW: 15.3 % (ref 11.5–15.5)
Smear Review: NORMAL
WBC Count: 2.1 10*3/uL — ABNORMAL LOW (ref 4.0–10.5)
nRBC: 0 % (ref 0.0–0.2)

## 2023-08-20 LAB — BASIC METABOLIC PANEL - CANCER CENTER ONLY
Anion gap: 5 (ref 5–15)
BUN: 19 mg/dL (ref 8–23)
CO2: 30 mmol/L (ref 22–32)
Calcium: 8.3 mg/dL — ABNORMAL LOW (ref 8.9–10.3)
Chloride: 96 mmol/L — ABNORMAL LOW (ref 98–111)
Creatinine: 0.53 mg/dL — ABNORMAL LOW (ref 0.61–1.24)
GFR, Estimated: >60 mL/min (ref >60)
Glucose, Bld: 125 mg/dL — ABNORMAL HIGH (ref 70–99)
Potassium: 4.5 mmol/L (ref 3.5–5.1)
Sodium: 131 mmol/L — ABNORMAL LOW (ref 135–145)

## 2023-08-20 LAB — MAGNESIUM: Magnesium: 1.5 mg/dL — ABNORMAL LOW (ref 1.7–2.4)

## 2023-08-20 MED ORDER — SODIUM CHLORIDE 0.9% FLUSH
10.0000 mL | Freq: Once | INTRAVENOUS | Status: AC
  Administered 2023-08-20: 10 mL

## 2023-08-20 MED ORDER — SODIUM CHLORIDE 0.9 % IV SOLN: Freq: Once | INTRAVENOUS | Status: AC

## 2023-08-20 MED ORDER — SODIUM CHLORIDE 0.9% FLUSH
10.0000 mL | Freq: Once | INTRAVENOUS | Status: AC
Start: 2023-08-20 — End: 2023-08-20
  Administered 2023-08-20: 10 mL

## 2023-08-20 MED ORDER — HEPARIN SOD (PORK) LOCK FLUSH 100 UNIT/ML IV SOLN
500.0000 [IU] | Freq: Once | INTRAVENOUS | Status: AC
  Administered 2023-08-20: 500 [IU]

## 2023-08-20 NOTE — Assessment & Plan Note (Signed)
 Please review oncology history for additional details and timeline of events.  Symptoms include severe pain and dysphagia since mid-November, progressively worsening.   cT3,cN2,cM0,p16+ tumor, Stage II disease.   His case was discussed in tumor conference on 06/09/2023.  Given the extent of disease, consensus opinion is to proceed with concurrent chemoradiation. We have discussed about role of cisplatin  being a radiosensitizer in the treatment of head and neck cancer.  We have discussed about the curative intent of chemoradiation for this patient.  Patient was willing to proceed with weekly cisplatin  after discussing risk versus benefits and side effect profile.  He started cycle 1 of cisplatin  at a dose of 40 mg/m on 07/09/2023.  Plan was to continue cisplatin  weekly during the course of radiation.  He began radiation treatments from 07/12/2023.  During week 2, chemotherapy had to be held because of severe fatigue with ECOG performance status of 3.  Ongoing treatment with chemotherapy and radiation. Radiation therapy is the primary treatment and will continue as planned. He is nearing the end of the treatment course, with a couple of weeks remaining. He expressed uncertainty about continuing treatment but was encouraged to proceed given the proximity to completion.  He was due for cycle 5 of chemotherapy last week.  However he developed neutropenia with ANC of 500.  Hence we held chemotherapy and arrange for Zarxio  as outpatient.  However patient got hospitalized with neutropenic fever.  No definite source of infection could be infected.  He was treated with appropriate antibiotics.  Also received Zarxio  while he was in the hospital.  Current issues include significant mucositis with bleeding occasionally.  He has been doing appropriate mouthwashes as recommended.  Symptoms seem to be improving.  He did receive a break from radiation therapy this week.  Will hold chemo.  White count slowly  improving.  Will continue with dose reduced chemotherapy to 30 mg/m, upon resumption.  - Ensure home hydration (80-90 oz non-caffeinated fluids daily).  Patient's wife was encouraged to start using feeding tube as suggested by the nutritionist and also to use feeding tube for hydration.  - Monitor and replace electrolytes as needed  RTC in 1 week for labs, follow-up and possible resumption of chemotherapy.

## 2023-08-20 NOTE — Telephone Encounter (Signed)
 Mike's voicemail box is full. I was unable to leave a voicemail.

## 2023-08-20 NOTE — Progress Notes (Unsigned)
 Patient seen by Dr. Gale Jude Pasam today  Vitals are within treatment parameters:No (Please specify and give further instructions.) Heart rate at 114. Labs are within treatment parameters: {Blank single:19197::"Yes","No (Please specify and give further instructions.)"}  WBC 2.1, Mag 1.5 Treatment plan has been signed: Yes   Per physician team, Patient will not be receiving treatment today.    "He is not getting chemotherapy today as he is on break from radiation treatments and also for low white count. We will proceed with 1 L of NS over 2 hours. It is under supportive therapy plan." Per Dr. Randye Buttner

## 2023-08-20 NOTE — Progress Notes (Signed)
 [facility]  ONCOLOGY CLINIC PROGRESS NOTE   Patient Care Team: [name], [name], MD as PCP - General (Family Medicine) Katheleen Palmer, RN as Oncology Nurse Navigator Maris Sickle, MD as Referring Physician (Ophthalmology) Trent Frizzle, MD as Consulting Physician (Urology) Kenith Payer, MD as Consulting Physician (Radiation Oncology) Neda Balk, RN as Registered Nurse Debbie Fails, Laura Polio, NP as Nurse Practitioner (Hematology and Oncology) Colie Dawes, MD as Attending Physician (Radiation Oncology) Artice Last, MD as Consulting Physician (Otolaryngology) Malmfelt, Nancyann Aye, RN as Oncology Nurse Navigator Arlo Berber, MD as Consulting Physician (Oncology)  PATIENT NAME: Isaac Carpenter   MR#: [id number] DOB: 15-Feb-1951  Date of visit: 08/20/2023   ASSESSMENT & PLAN:   Isaac Carpenter is a 73 y.o. gentleman with a past medical history of prostate cancer diagnosed in March 2024, S/P radioactive seed implant/brachytherapy, hypertension, dyslipidemia, hypothyroidism, obstructive sleep apnea. He presented for follow up of recently diagnosed squamous cell carcinoma of the right tonsil, clinical stage II disease (cT3,cN2,cM0,p16+).   No problem-specific Assessment & Plan notes found for this encounter.   Oral mucositis Oral mucositis with raw sensation on the right side of the tongue. He has lidocaine  at home for symptomatic relief. - Use lidocaine  for oral mucositis.  Weight loss He has lost three pounds, which is not unexpected given the treatment course. Efforts to maintain nutrition are ongoing, and Reglan  has been helpful in managing symptoms.  Hypertension Currently holding antihypertensive medications due to soft blood pressures and risk of further drop. - Send separate prescriptions for lisinopril  and hydrochlorothiazide  in case he is needed.  I reviewed lab results and outside records for this visit and discussed relevant results  with the patient. Diagnosis, plan of care and treatment options were also discussed in detail with the patient. Opportunity provided to ask questions and answers provided to his apparent satisfaction. Provided instructions to call our clinic with any problems, questions or concerns prior to return visit. I recommended to continue follow-up with PCP and sub-specialists. He verbalized understanding and agreed with the plan.   NCCN guidelines have been consulted in the planning of this patient's care.  I spent a total of 40 minutes during this encounter with the patient including review of chart and various tests results, discussions about plan of care and coordination of care plan.   Arlo Berber, MD  08/20/2023 8:54 AM  [facility] - [facility] [address] [place] [address] Dept: [phone] Dept Fax: [phone]    CHIEF COMPLAINT/ REASON FOR VISIT:   Follow-up for squamous cell carcinoma of the right tonsil, stage II disease (cT3,cN2,cM0,p16+)  Current Treatment: Concurrent chemoradiation with weekly cisplatin  started from 07/09/2023.  INTERVAL HISTORY:    Discussed the use of AI scribe software for clinical note transcription with the patient, who gave verbal consent to proceed.   Isaac Carpenter is here today for repeat clinical assessment.   He started concurrent chemoradiation with weekly cisplatin  on 07/09/2023.  History of Present Illness  Isaac Carpenter "Isaac Carpenter" is a 73 year old male undergoing treatment for head and neck cancer who presents with low white blood cell count.  He is currently undergoing chemotherapy and radiation for head and neck cancer. His white blood cell count has significantly decreased to 700 from 2,600 last week and 3,100 the week before, leading to a temporary hold on chemotherapy.  He experiences low energy  levels, likely due to ongoing chemotherapy and radiation. His  hemoglobin level is 11, which is low but not at a transfusion threshold. His platelet count is 78,000, which is low but not low enough to halt treatment based on platelet count alone.  He has lost three pounds recently, which is concerning given his cancer treatment. He uses Reglan , which he finds helpful.  He has a raw sensation on the right side of his tongue and uses lidocaine  for relief.  His blood pressure readings have been inconsistent, with one arm showing high readings and the other showing typical levels. He is not currently taking his blood pressure medications, lisinopril  and hydrochlorothiazide , but has requested them to be prescribed separately so he can crush them if needed.    I have reviewed the past medical history, past surgical history, social history and family history with the patient and they are unchanged from previous note.  HISTORY OF PRESENT ILLNESS:   ONCOLOGY HISTORY:   He presented to his PCP Dr Betty [name] on 05-17-23 with complains of right-sided jaw/maxillary pain that tends to occur when coughing. Pain had persisted for several months at that time following an upper respiratory infection.    Subsequently, he underwent a CT soft tissue neck on 05-17-23 which revealed an ulcerated enhancing mass involving the superior oropharynx and soft palate/uvula measuring 3.4 x 3.0 x 3.6 cm in the greatest extent that is concerning for malignancy. No evidence of cervical lymphadenopathy was indicated on scan    In light of findings, the patient saw Dr. Artice Last on 05-27-23 with worsening symptoms. During his visit, he underwent a flexible fiberoptic laryngoscopy with several biopsies of the friable tissue from the right oropharyngeal mass.     Biopsy of right oropharynx mass on 05-27-23 revealed: Invasive moderately differentiated squamous cell carcinoma. Immunohistochemistry for p16 shows diffuse strong positivity.    During most recent visit with Dr. Soldatova on  06-04-23, patient reported constant worsening symptoms of odynophagia and dysphagia. Stating that he's unable to maintain adequate nutrition as food typically consists of few bites of soft food and liquids. Pain does not seem to be managed by pain relievers including oxycodone . To further evaluate the extent of the disease, he underwent a PET scan performed on 06-07-23 revealing a right  oropharyngeal tumor >4cm, likely b/l level II adenopathy, no distant metastases.    cT3,cN2,cM0,p16+ tumor, Stage II disease.    Plan made for concurrent chemoradiation with weekly cisplatin . Started Cisplatin  from 07/09/2023 and radiation from 07/12/23.   Oncology History  Malignant neoplasm of prostate (HCC)  05/13/2022 Cancer Staging   Staging form: Prostate, AJCC 8th Edition - Clinical stage from 05/13/2022: Stage IIC (cT1c, cN0, cM0, PSA: 13.2, Grade Group: 3) - Signed by Keitha Pata, PA-C on 07/17/2022 Histopathologic type: Adenocarcinoma, NOS Stage prefix: Initial diagnosis Prostate specific antigen (PSA) range: 10 to 19 Gleason primary pattern: 4 Gleason secondary pattern: 3 Gleason score: 7 Histologic grading system: 5 grade system Number of biopsy cores examined: 12 Number of biopsy cores positive: 4 Location of positive needle core biopsies: Both sides   07/17/2022 Initial Diagnosis   Malignant neoplasm of prostate (HCC)   Malignant neoplasm of tonsillar fossa (HCC)  06/08/2023 Initial Diagnosis   Malignant neoplasm of tonsillar fossa (HCC)   06/08/2023 Cancer Staging   Staging form: Pharynx - HPV-Mediated Oropharynx, AJCC 8th Edition - Clinical stage from 06/08/2023: Stage II (cT3, cN2, cM0, p16+) - Signed by Colie Dawes, MD on 06/08/2023 Stage prefix: Initial diagnosis  07/09/2023 -  Chemotherapy   Patient is on Treatment Plan : HEAD/NECK Cisplatin  (40) q7d         REVIEW OF SYSTEMS:   Review of Systems - Oncology  All other pertinent systems were reviewed with the patient and are  negative.  ALLERGIES: He is allergic to other and testosterone.  MEDICATIONS:  Current Outpatient Medications  Medication Sig Dispense Refill   ALPRAZolam  (XANAX ) 1 MG tablet Take 0.5 tablets (0.5 mg total) by mouth 2 (two) times daily as needed for anxiety. 60 tablet 0   aspirin 81 MG chewable tablet Chew 81 mg by mouth daily.     ciprofloxacin  (CIPRO ) 500 MG/5ML (10%) suspension Take 5 mLs (500 mg total) by mouth 2 (two) times daily. Do NOT administer via tube 100 mL 0   doxazosin  (CARDURA ) 1 MG tablet Take 1 tablet (1 mg total) by mouth at bedtime. 30 tablet 1   esomeprazole  (NEXIUM ) 40 MG capsule Take 1 capsule (40 mg total) by mouth daily at 12 noon. See instructions to administer through Tube. 30 capsule 0   fluconazole  (DIFLUCAN ) 40 MG/ML suspension Take 5mL today, then 2.5mL daily for 20 more days. HOLD ATORVASTATIN  WHILE ON THIS. 55 mL 0   HYDROcodone -acetaminophen  (HYCET) 7.5-325 mg/15 ml solution Take 10 mLs by mouth 4 (four) times daily as needed for moderate pain (pain score 4-6). 500 mL 0   levothyroxine  (SYNTHROID ) 88 MCG tablet TAKE ONE TABLET BY MOUTH DAILY. 90 tablet 2   lidocaine -prilocaine  (EMLA ) cream Apply to affected area once 30 g 3   metoCLOPramide  (REGLAN ) 10 MG/10ML SOLN Place 10 mLs (10 mg total) into feeding tube 3 (three) times daily before meals. 450 mL 1   Nutritional Supplements (NUTREN 1.5) LIQD 2 cartons Nutren 1.5 (500 ml) QID via tube. Flush with 60 ml water  before/after each bolus. Provide additional 250 ml water  flush 4x/day in between feedings to meet hydration needs. Provides 3000 kcal, 136 g protein, 1528 ml free water  (3008 ml total water ) 2000 ml/day meets 100% DRI     solifenacin (VESICARE) 5 MG tablet Take 5 mg by mouth daily.     dexamethasone  (DEXAMETHASONE  INTENSOL) 1 MG/ML solution TAKE BY MOUTH OR G-TUBE ONCE DAILY FOR THREE DAYS AFTER CHEMO (Patient not taking: Reported on 08/19/2023) 30 mL 0   lidocaine  (XYLOCAINE ) 2 % solution Patient: Mix  1part 2% viscous lidocaine , 1part water . Swish and spit 10mL of diluted mixture every 2 hours as needed. 300 mL 3   ondansetron  (ZOFRAN -ODT) 8 MG disintegrating tablet Take 1 tablet (8 mg total) by mouth every 8 (eight) hours as needed for nausea or vomiting. (Patient not taking: Reported on 08/19/2023) 30 tablet 2   prochlorperazine  (COMPAZINE ) 10 MG tablet Take 1 tablet (10 mg total) by mouth every 6 (six) hours as needed (Nausea or vomiting). (Patient not taking: Reported on 08/19/2023) 30 tablet 1   No current facility-administered medications for this visit.     VITALS:   Blood pressure 111/76, pulse (!) 114, temperature (!) 97.2 F (36.2 C), temperature source Temporal, resp. rate 17, weight 262 lb (118.8 kg), SpO2 97%.  Wt Readings from Last 3 Encounters:  08/20/23 262 lb (118.8 kg)  08/17/23 266 lb 1.5 oz (120.7 kg)  08/13/23 263 lb 11.2 oz (119.6 kg)    Body mass index is 34.57 kg/m.    Onc Performance Status - 08/20/23 0839       ECOG Perf Status   ECOG Perf Status Capable of only  limited selfcare, confined to bed or chair more than 50% of waking hours      KPS SCALE   KPS % SCORE Requires considerable assistance, and frequent medical care                PHYSICAL EXAM:   Physical Exam Constitutional:      General: He is not in acute distress.    Comments: Appears tired.  Presented to clinic in a wheelchair today  HENT:     Head: Normocephalic and atraumatic.     Mouth/Throat:     Comments: Right oropharynx tonsil tumor eroding through soft palate, tumor reaches uvula, uvula swollen Eyes:     General: No scleral icterus.    Conjunctiva/sclera: Conjunctivae normal.  Cardiovascular:     Rate and Rhythm: Normal rate and regular rhythm.     Heart sounds: Normal heart sounds.  Pulmonary:     Effort: Pulmonary effort is normal.     Breath sounds: Normal breath sounds.  Chest:     Comments: Right sided Port-A-Cath on place without any signs of  infection Abdominal:     General: There is no distension.     Comments: Feeding tube in place  Musculoskeletal:     Right lower leg: No edema.     Left lower leg: No edema.  Neurological:     General: No focal deficit present.     Mental Status: He is alert and oriented to person, place, and time.      LABORATORY DATA:   I have reviewed the data as listed.  Results for orders placed or performed in visit on 08/20/23  Magnesium   Result Value Ref Range   Magnesium  1.5 (L) 1.7 - 2.4 mg/dL  Basic Metabolic Panel - Cancer Center Only  Result Value Ref Range   Sodium 131 (L) 135 - 145 mmol/L   Potassium 4.5 3.5 - 5.1 mmol/L   Chloride 96 (L) 98 - 111 mmol/L   CO2 30 22 - 32 mmol/L   Glucose, Bld 125 (H) 70 - 99 mg/dL   BUN 19 8 - 23 mg/dL   Creatinine 1.61 (L) 0.96 - 1.24 mg/dL   Calcium  8.3 (L) 8.9 - 10.3 mg/dL   GFR, Estimated >04 >54 mL/min   Anion gap 5 5 - 15  CBC with Differential (Cancer Center Only)  Result Value Ref Range   WBC Count 2.1 (L) 4.0 - 10.5 K/uL   RBC 3.04 (L) 4.22 - 5.81 MIL/uL   Hemoglobin 9.5 (L) 13.0 - 17.0 g/dL   HCT 09.8 (L) 11.9 - 14.7 %   MCV 90.1 80.0 - 100.0 fL   MCH 31.3 26.0 - 34.0 pg   MCHC 34.7 30.0 - 36.0 g/dL   RDW 82.9 56.2 - 13.0 %   Platelet Count 120 (L) 150 - 400 K/uL   nRBC 0.0 0.0 - 0.2 %   Neutrophils Relative % 47 %   Neutro Abs 1.0 (L) 1.7 - 7.7 K/uL   Lymphocytes Relative 35 %   Lymphs Abs 0.7 0.7 - 4.0 K/uL   Monocytes Relative 11 %   Monocytes Absolute 0.2 0.1 - 1.0 K/uL   Eosinophils Relative 1 %   Eosinophils Absolute 0.0 0.0 - 0.5 K/uL   Basophils Relative 1 %   Basophils Absolute 0.0 0.0 - 0.1 K/uL   Smear Review Normal platelet morphology    Immature Granulocytes 5 %   Abs Immature Granulocytes 0.10 (H) 0.00 - 0.07 K/uL   Polychromasia  PRESENT     RADIOGRAPHIC STUDIES:  I have personally reviewed the radiological images as listed and agree with the findings in the report.  DG Chest Port 1 View Result  Date: 08/14/2023 CLINICAL DATA:  Possible sepsis and fevers EXAM: PORTABLE CHEST 1 VIEW COMPARISON:  08/04/2018 FINDINGS: Cardiac shadow is within normal limits somewhat accentuated by the portable technique. Right chest wall port is noted. Lungs are well aerated bilaterally. No focal infiltrate or effusion is seen. No bony abnormality is noted. IMPRESSION: No active disease. Electronically Signed   By: Violeta Grey M.D.   On: 08/14/2023 14:54     CODE STATUS:  Code Status History     Date Active Date Inactive Code Status Order ID Comments User Context   06/18/2023 1400 06/19/2023 0508 Full Code [id number]  Elene Griffes, MD HOV    Questions for Most Recent Historical Code Status (Order [id number])     Question Answer   By: Consent: discussion documented in EHR            No orders of the defined types were placed in this encounter.    Future Appointments  Date Time Provider Department Center  08/20/2023  9:30 AM [facility] INFUSION [facility] None  08/20/2023 10:30 AM Woody Heading, RD [facility] None  08/20/2023 11:00 AM Maudie Sorrow, LCSW [facility] None  08/23/2023  8:30 AM [facility] 4 [facility] None  08/23/2023  8:45 AM [facility]-SQUIRE [facility] None  08/24/2023  8:30 AM [facility] LINAC 4 [facility] None  08/25/2023  8:30 AM [facility] LINAC 4 [facility] None  08/26/2023  8:30 AM [facility] LINAC 4 [facility] None  08/27/2023  8:30 AM [facility] LINAC 4 [facility] None  08/30/2023  8:30 AM [facility] LINAC 4 [facility] None  08/31/2023  8:15 AM [facility] LINAC 4 [facility] None  09/01/2023  8:30 AM [facility] LINAC 4 [facility] None  09/02/2023  8:15 AM [facility] LINAC 4 [facility] None  09/03/2023  8:30 AM [facility] LINAC 4 [facility] None  09/16/2023  9:00 AM Breedlove Blue, Blaire L, PT [facility] None     This document was completed utilizing speech recognition software. Grammatical errors, random word insertions, pronoun errors, and incomplete sentences  are an occasional consequence of this system due to software limitations, ambient noise, and hardware issues. Any formal questions or concerns about the content, text or information contained within the body of this dictation should be directly addressed to the provider for clarification.

## 2023-08-20 NOTE — Patient Instructions (Signed)
 Dehydration, Adult Dehydration is a condition in which there is not enough water or other fluids in the body. This happens when a person loses more fluids than they take in. Important organs cannot work right without the right amount of fluids. Any loss of fluids from the body can cause dehydration. Dehydration can be mild, worse, or very bad. It should be treated right away to keep it from getting very bad. What are the causes? Conditions that cause loss of water in the body. They include: Watery poop (diarrhea). Vomiting. Sweating a lot. Fever. Infection. Peeing (urinating) a lot. Not drinking enough fluids. Certain medicines, such as medicines that take extra fluid out of the body (diuretics). Lack of safe drinking water. Not being able to get enough water and food. What increases the risk? Having a long-term (chronic) illness that has not been treated the right way, such as: Diabetes. Heart disease. Kidney disease. Being 25 years of age or older. Having a disability. Living in a place that is high above the ground or sea (high in altitude). The thinner, drier air causes more fluid loss. Doing exercises that put stress on your body for a long time. Being active when in hot places. What are the signs or symptoms? Symptoms of dehydration depend on how bad it is. Mild or worse dehydration Thirst. Dry lips or dry mouth. Feeling dizzy or light-headed. Muscle cramps. Passing little pee or dark pee. Pee may be the color of tea. Headache. Very bad dehydration Changes in skin. Skin may: Be cold to the touch (clammy). Be blotchy or pale. Not go back to normal right after you pinch it and let it go. Little or no tears, pee, or sweat. Fast breathing. Low blood pressure. Weak pulse. Pulse that is more than 100 beats a minute when you are sitting still. Other changes, such as: Feeling very thirsty. Eyes that look hollow (sunken). Cold hands and feet. Being confused. Being very  tired (lethargic) or having trouble waking from sleep. Losing weight. Loss of consciousness. How is this treated? Treatment for this condition depends on how bad your dehydration is. Treatment should start right away. Do not wait until your condition gets very bad. Very bad dehydration is an emergency. You will need to go to a hospital. Mild or worse dehydration can be treated at home. You may be asked to: Drink more fluids. Drink an oral rehydration solution (ORS). This drink gives you the right amount of fluids, salts, and minerals (electrolytes). Very bad dehydration can be treated: With fluids through an IV tube. By correcting low levels of electrolytes in the body. By treating the problem that caused your dehydration. Follow these instructions at home: Oral rehydration solution If told by your doctor, drink an ORS: Make an ORS. Use instructions on the package. Start by drinking small amounts, about  cup (120 mL) every 5-10 minutes. Slowly drink more until you have had the amount that your doctor said to have.  Eating and drinking  Drink enough clear fluid to keep your pee pale yellow. If you were told to drink an ORS, finish the ORS first. Then, start slowly drinking other clear fluids. Drink fluids such as: Water. Do not drink only water. Doing that can make the salt (sodium) level in your body get too low. Water from ice chips you suck on. Fruit juice that you have added water to (diluted). Low-calorie sports drinks. Eat foods that have the right amounts of salts and minerals, such as bananas, oranges, potatoes,  tomatoes, or spinach. Do not drink alcohol. Avoid drinks that have caffeine or sugar. These include:: High-calorie sports drinks. Fruit juice that you did not add water to. Soda. Coffee or energy drinks. Avoid foods that are greasy or have a lot of fat or sugar. General instructions Take over-the-counter and prescription medicines only as told by your doctor. Do  not take sodium tablets. Doing that can make the salt level in your body get too high. Return to your normal activities as told by your doctor. Ask your doctor what activities are safe for you. Keep all follow-up visits. Your doctor may check and change your treatment. Contact a doctor if: You have pain in your belly (abdomen) and the pain: Gets worse. Stays in one place. You have a rash. You have a stiff neck. You get angry or annoyed more easily than normal. You are more tired or have a harder time waking than normal. You feel weak or dizzy. You feel very thirsty. Get help right away if: You have any symptoms of very bad dehydration. You vomit every time you eat or drink. Your vomiting gets worse, does not go away, or you vomit blood or green stuff. You are getting treatment, but symptoms are getting worse. You have a fever. You have a very bad headache. You have: Diarrhea that gets worse or does not go away. Blood in your poop (stool). This may cause poop to look black and tarry. No pee in 6-8 hours. Only a small amount of pee in 6-8 hours, and the pee is very dark. You have trouble breathing. These symptoms may be an emergency. Get help right away. Call 911. Do not wait to see if the symptoms will go away. Do not drive yourself to the hospital. This information is not intended to replace advice given to you by your health care provider. Make sure you discuss any questions you have with your health care provider. Document Revised: 11/03/2021 Document Reviewed: 11/03/2021 Elsevier Patient Education  2024 [employer].

## 2023-08-20 NOTE — Progress Notes (Signed)
 [facility] CSW Progress Note  Visual merchandiser met with patient during infusion to assess psychosocial needs. Patient discussed physical and mental impacts of chemotherapy and upcoming conclusion of radiation. Patient and CSW discussed his support system and gratitude. CSW will see patient on 5/09  Maudie Sorrow, LCSW Clinical Social Worker [facility]

## 2023-08-20 NOTE — Progress Notes (Signed)
 [facility]  ONCOLOGY CLINIC PROGRESS NOTE   Patient Care Team: [name], [name], MD as PCP - General (Family Medicine) Katheleen Palmer, RN as Oncology Nurse Navigator Maris Sickle, MD as Referring Physician (Ophthalmology) Trent Frizzle, MD as Consulting Physician (Urology) Kenith Payer, MD as Consulting Physician (Radiation Oncology) Neda Balk, RN as Registered Nurse Debbie Fails, Laura Polio, NP as Nurse Practitioner (Hematology and Oncology) Colie Dawes, MD as Attending Physician (Radiation Oncology) Artice Last, MD as Consulting Physician (Otolaryngology) Malmfelt, Nancyann Aye, RN as Oncology Nurse Navigator Arlo Berber, MD as Consulting Physician (Oncology)  PATIENT NAME: Isaac Carpenter   MR#: [id number] DOB: [birth year]  Date of visit: 08/20/2023   ASSESSMENT & PLAN:   Isaac Carpenter is a 73 y.o. gentleman with a past medical history of prostate cancer diagnosed in March 2024, S/P radioactive seed implant/brachytherapy, hypertension, dyslipidemia, hypothyroidism, obstructive sleep apnea. He presented for follow up of recently diagnosed squamous cell carcinoma of the right tonsil, clinical stage II disease (cT3,cN2,cM0,p16+).   Malignant neoplasm of tonsillar fossa (HCC) Please review oncology history for additional details and timeline of events.  Symptoms include severe pain and dysphagia since mid-November, progressively worsening.   cT3,cN2,cM0,p16+ tumor, Stage II disease.   His case was discussed in tumor conference on 06/09/2023.  Given the extent of disease, consensus opinion is to proceed with concurrent chemoradiation. We have discussed about role of cisplatin  being a radiosensitizer in the treatment of head and neck cancer.  We have discussed about the curative intent of chemoradiation for this patient.  Patient was willing to proceed with weekly cisplatin  after discussing risk versus benefits and side effect profile.  He started  cycle 1 of cisplatin  at a dose of 40 mg/m on 07/09/2023.  Plan was to continue cisplatin  weekly during the course of radiation.  He began radiation treatments from 07/12/2023.  During week 2, chemotherapy had to be held because of severe fatigue with ECOG performance status of 3.  Ongoing treatment with chemotherapy and radiation. Radiation therapy is the primary treatment and will continue as planned. He is nearing the end of the treatment course, with a couple of weeks remaining. He expressed uncertainty about continuing treatment but was encouraged to proceed given the proximity to completion.  He was due for cycle 5 of chemotherapy last week.  However he developed neutropenia with ANC of 500.  Hence we held chemotherapy and arrange for Zarxio  as outpatient.  However patient got hospitalized with neutropenic fever.  No definite source of infection could be infected.  He was treated with appropriate antibiotics.  Also received Zarxio  while he was in the hospital.  Current issues include significant mucositis with bleeding occasionally.  He has been doing appropriate mouthwashes as recommended.  Symptoms seem to be improving.  He did receive a break from radiation therapy this week.  Will hold chemo.  White count slowly improving.  Will continue with dose reduced chemotherapy to 30 mg/m, upon resumption.  - Ensure home hydration (80-90 oz non-caffeinated fluids daily).  Patient's wife was encouraged to start using feeding tube as suggested by the nutritionist and also to use feeding tube for hydration.  - Monitor and replace electrolytes as needed  RTC in 1 week for labs, follow-up and possible resumption of chemotherapy.  Mucositis due to chemoradiation Mucositis due to radiation therapy causing discomfort and oral tissue buildup. A break from radiation and chemotherapy is expected to aid recovery. Symptomatic relief with baking soda and salt rinses, lidocaine ,  and magic mouthwash. -  Continue baking soda and salt rinses - Use lidocaine  for numbing - Alternate with magic mouthwash - Evaluate mucositis with Dr. Lurena Sally on Monday  We will arrange for IV fluids today, on Monday and Wednesday next week.  I reviewed lab results and outside records for this visit and discussed relevant results with the patient. Diagnosis, plan of care and treatment options were also discussed in detail with the patient. Opportunity provided to ask questions and answers provided to his apparent satisfaction. Provided instructions to call our clinic with any problems, questions or concerns prior to return visit. I recommended to continue follow-up with PCP and sub-specialists. He verbalized understanding and agreed with the plan.   NCCN guidelines have been consulted in the planning of this patient's care.  I spent a total of 30 minutes during this encounter with the patient including review of chart and various tests results, discussions about plan of care and coordination of care plan.   Arlo Berber, MD  08/20/2023 5:57 PM  [facility] - A DEPT OF [facility] [address] [place] [address] Dept: [phone] Dept Fax: [phone]    CHIEF COMPLAINT/ REASON FOR VISIT:   Follow-up for squamous cell carcinoma of the right tonsil, stage II disease (cT3,cN2,cM0,p16+)  Current Treatment: Concurrent chemoradiation with weekly cisplatin  started from 07/09/2023.  INTERVAL HISTORY:    Discussed the use of AI scribe software for clinical note transcription with the patient, who gave verbal consent to proceed.   Isaac Carpenter is here today for repeat clinical assessment.   He started concurrent chemoradiation with weekly cisplatin  on 07/09/2023.  History of Present Illness [name] "[name]" is a 73 year old male undergoing treatment for head and neck cancer who presents with oral mucositis and bleeding.  He is  experiencing significant oral mucositis with bleeding from the roof of his mouth, particularly when the tissue sloughs off. He manages the symptoms with mouthwashes, including baking soda and salt rinses, as well as a 'magic mouthwash' and lidocaine  for pain relief. The lidocaine  does not cause burning, but the magic mouthwash did previously. He describes a buildup of hard tissue in his mouth that becomes scratchy and makes it hard to breathe, requiring frequent rinsing to manage.  He is currently on a break from radiation and chemotherapy treatments. His white blood cell count was previously low but is now slowly increasing, currently at 2,100, aided by booster shots received in the hospital. His hemoglobin level is 9.5, which has improved since the cessation of chemotherapy. No fevers, chills, or night sweats since returning home from the hospital.  He receives nutrition primarily through tube feeds due to difficulty swallowing. He is also on ciprofloxacin , administered through feeding tube, as he cannot swallow tablets. The liquid form is challenging to administer but necessary due to his swallowing difficulties.    I have reviewed the past medical history, past surgical history, social history and family history with the patient and they are unchanged from previous note.  HISTORY OF PRESENT ILLNESS:   ONCOLOGY HISTORY:   He presented to his PCP Dr Betty [name] on 05-17-23 with complains of right-sided jaw/maxillary pain that tends to occur when coughing. Pain had persisted for several months at that time following an upper respiratory infection.    Subsequently, he underwent a CT soft tissue neck on 05-17-23 which revealed an ulcerated enhancing mass involving the superior oropharynx  and soft palate/uvula measuring 3.4 x 3.0 x 3.6 cm in the greatest extent that is concerning for malignancy. No evidence of cervical lymphadenopathy was indicated on scan    In light of findings, the patient saw Dr.  Artice Last on 05-27-23 with worsening symptoms. During his visit, he underwent a flexible fiberoptic laryngoscopy with several biopsies of the friable tissue from the right oropharyngeal mass.     Biopsy of right oropharynx mass on 05-27-23 revealed: Invasive moderately differentiated squamous cell carcinoma. Immunohistochemistry for p16 shows diffuse strong positivity.    During most recent visit with Dr. Soldatova on 06-04-23, patient reported constant worsening symptoms of odynophagia and dysphagia. Stating that he's unable to maintain adequate nutrition as food typically consists of few bites of soft food and liquids. Pain does not seem to be managed by pain relievers including oxycodone . To further evaluate the extent of the disease, he underwent a PET scan performed on 06-07-23 revealing a right  oropharyngeal tumor >4cm, likely b/l level II adenopathy, no distant metastases.    cT3,cN2,cM0,p16+ tumor, Stage II disease.    Plan made for concurrent chemoradiation with weekly cisplatin . Started Cisplatin  from 07/09/2023 and radiation from 07/12/23.   Oncology History  Malignant neoplasm of prostate (HCC)  05/13/2022 Cancer Staging   Staging form: Prostate, AJCC 8th Edition - Clinical stage from 05/13/2022: Stage IIC (cT1c, cN0, cM0, PSA: 13.2, Grade Group: 3) - Signed by Keitha Pata, PA-C on 07/17/2022 Histopathologic type: Adenocarcinoma, NOS Stage prefix: Initial diagnosis Prostate specific antigen (PSA) range: 10 to 19 Gleason primary pattern: 4 Gleason secondary pattern: 3 Gleason score: 7 Histologic grading system: 5 grade system Number of biopsy cores examined: 12 Number of biopsy cores positive: 4 Location of positive needle core biopsies: Both sides   07/17/2022 Initial Diagnosis   Malignant neoplasm of prostate (HCC)   Malignant neoplasm of tonsillar fossa (HCC)  06/08/2023 Initial Diagnosis   Malignant neoplasm of tonsillar fossa (HCC)   06/08/2023 Cancer Staging   Staging  form: Pharynx - HPV-Mediated Oropharynx, AJCC 8th Edition - Clinical stage from 06/08/2023: Stage II (cT3, cN2, cM0, p16+) - Signed by Colie Dawes, MD on 06/08/2023 Stage prefix: Initial diagnosis   07/09/2023 -  Chemotherapy   Patient is on Treatment Plan : HEAD/NECK Cisplatin  (40) q7d         REVIEW OF SYSTEMS:   Review of Systems - Oncology  All other pertinent systems were reviewed with the patient and are negative.  ALLERGIES: He is allergic to other and testosterone.  MEDICATIONS:  Current Outpatient Medications  Medication Sig Dispense Refill   ALPRAZolam  (XANAX ) 1 MG tablet Take 0.5 tablets (0.5 mg total) by mouth 2 (two) times daily as needed for anxiety. 60 tablet 0   aspirin 81 MG chewable tablet Chew 81 mg by mouth daily.     ciprofloxacin  (CIPRO ) 500 MG/5ML (10%) suspension Take 5 mLs (500 mg total) by mouth 2 (two) times daily. Do NOT administer via tube 100 mL 0   doxazosin  (CARDURA ) 1 MG tablet Take 1 tablet (1 mg total) by mouth at bedtime. 30 tablet 1   esomeprazole  (NEXIUM ) 40 MG capsule Take 1 capsule (40 mg total) by mouth daily at 12 noon. See instructions to administer through Tube. 30 capsule 0   fluconazole  (DIFLUCAN ) 40 MG/ML suspension Take 5mL today, then 2.5mL daily for 20 more days. HOLD ATORVASTATIN  WHILE ON THIS. 55 mL 0   HYDROcodone -acetaminophen  (HYCET) 7.5-325 mg/15 ml solution Take 10 mLs by mouth 4 (four)  times daily as needed for moderate pain (pain score 4-6). 500 mL 0   levothyroxine  (SYNTHROID ) 88 MCG tablet TAKE ONE TABLET BY MOUTH DAILY. 90 tablet 2   lidocaine -prilocaine  (EMLA ) cream Apply to affected area once 30 g 3   metoCLOPramide  (REGLAN ) 10 MG/10ML SOLN Place 10 mLs (10 mg total) into feeding tube 3 (three) times daily before meals. 450 mL 1   Nutritional Supplements (NUTREN 1.5) LIQD 2 cartons Nutren 1.5 (500 ml) QID via tube. Flush with 60 ml water  before/after each bolus. Provide additional 250 ml water  flush 4x/day in between  feedings to meet hydration needs. Provides 3000 kcal, 136 g protein, 1528 ml free water  (3008 ml total water ) 2000 ml/day meets 100% DRI     solifenacin (VESICARE) 5 MG tablet Take 5 mg by mouth daily.     dexamethasone  (DEXAMETHASONE  INTENSOL) 1 MG/ML solution TAKE BY MOUTH OR G-TUBE ONCE DAILY FOR THREE DAYS AFTER CHEMO (Patient not taking: Reported on 08/19/2023) 30 mL 0   lidocaine  (XYLOCAINE ) 2 % solution Patient: Mix 1part 2% viscous lidocaine , 1part water . Swish and spit 10mL of diluted mixture every 2 hours as needed. 300 mL 3   ondansetron  (ZOFRAN -ODT) 8 MG disintegrating tablet Take 1 tablet (8 mg total) by mouth every 8 (eight) hours as needed for nausea or vomiting. (Patient not taking: Reported on 08/19/2023) 30 tablet 2   prochlorperazine  (COMPAZINE ) 10 MG tablet Take 1 tablet (10 mg total) by mouth every 6 (six) hours as needed (Nausea or vomiting). (Patient not taking: Reported on 08/19/2023) 30 tablet 1   No current facility-administered medications for this visit.     VITALS:   Blood pressure 111/76, pulse (!) 114, temperature (!) 97.2 F (36.2 C), temperature source Temporal, resp. rate 17, weight 262 lb (118.8 kg), SpO2 97%.  Wt Readings from Last 3 Encounters:  08/20/23 262 lb (118.8 kg)  08/17/23 266 lb 1.5 oz (120.7 kg)  08/13/23 263 lb 11.2 oz (119.6 kg)    Body mass index is 34.57 kg/m.    Onc Performance Status - 08/20/23 0839       ECOG Perf Status   ECOG Perf Status Capable of only limited selfcare, confined to bed or chair more than 50% of waking hours      KPS SCALE   KPS % SCORE Requires considerable assistance, and frequent medical care                PHYSICAL EXAM:   Physical Exam Constitutional:      General: He is not in acute distress.    Comments: Appears tired.  Presented to clinic in a wheelchair today  HENT:     Head: Normocephalic and atraumatic.     Mouth/Throat:     Comments: Right oropharynx tonsil tumor eroding through soft  palate, tumor reaches uvula, uvula swollen Eyes:     General: No scleral icterus.    Conjunctiva/sclera: Conjunctivae normal.  Cardiovascular:     Rate and Rhythm: Normal rate and regular rhythm.     Heart sounds: Normal heart sounds.  Pulmonary:     Effort: Pulmonary effort is normal.     Breath sounds: Normal breath sounds.  Chest:     Comments: Right sided Port-A-Cath on place without any signs of infection Abdominal:     General: There is no distension.     Comments: Feeding tube in place  Musculoskeletal:     Right lower leg: No edema.     Left lower  leg: No edema.  Neurological:     General: No focal deficit present.     Mental Status: He is alert and oriented to person, place, and time.      LABORATORY DATA:   I have reviewed the data as listed.  Results for orders placed or performed in visit on 08/20/23  Magnesium   Result Value Ref Range   Magnesium  1.5 (L) 1.7 - 2.4 mg/dL  Basic Metabolic Panel - Cancer Center Only  Result Value Ref Range   Sodium 131 (L) 135 - 145 mmol/L   Potassium 4.5 3.5 - 5.1 mmol/L   Chloride 96 (L) 98 - 111 mmol/L   CO2 30 22 - 32 mmol/L   Glucose, Bld 125 (H) 70 - 99 mg/dL   BUN 19 8 - 23 mg/dL   Creatinine 1.61 (L) 0.96 - 1.24 mg/dL   Calcium  8.3 (L) 8.9 - 10.3 mg/dL   GFR, Estimated >04 >54 mL/min   Anion gap 5 5 - 15  CBC with Differential (Cancer Center Only)  Result Value Ref Range   WBC Count 2.1 (L) 4.0 - 10.5 K/uL   RBC 3.04 (L) 4.22 - 5.81 MIL/uL   Hemoglobin 9.5 (L) 13.0 - 17.0 g/dL   HCT 09.8 (L) 11.9 - 14.7 %   MCV 90.1 80.0 - 100.0 fL   MCH 31.3 26.0 - 34.0 pg   MCHC 34.7 30.0 - 36.0 g/dL   RDW 82.9 56.2 - 13.0 %   Platelet Count 120 (L) 150 - 400 K/uL   nRBC 0.0 0.0 - 0.2 %   Neutrophils Relative % 47 %   Neutro Abs 1.0 (L) 1.7 - 7.7 K/uL   Lymphocytes Relative 35 %   Lymphs Abs 0.7 0.7 - 4.0 K/uL   Monocytes Relative 11 %   Monocytes Absolute 0.2 0.1 - 1.0 K/uL   Eosinophils Relative 1 %   Eosinophils  Absolute 0.0 0.0 - 0.5 K/uL   Basophils Relative 1 %   Basophils Absolute 0.0 0.0 - 0.1 K/uL   Smear Review Normal platelet morphology    Immature Granulocytes 5 %   Abs Immature Granulocytes 0.10 (H) 0.00 - 0.07 K/uL   Polychromasia PRESENT     RADIOGRAPHIC STUDIES:  I have personally reviewed the radiological images as listed and agree with the findings in the report.  DG Chest Port 1 View Result Date: 08/14/2023 CLINICAL DATA:  Possible sepsis and fevers EXAM: PORTABLE CHEST 1 VIEW COMPARISON:  08/04/2018 FINDINGS: Cardiac shadow is within normal limits somewhat accentuated by the portable technique. Right chest wall port is noted. Lungs are well aerated bilaterally. No focal infiltrate or effusion is seen. No bony abnormality is noted. IMPRESSION: No active disease. Electronically Signed   By: Violeta Grey M.D.   On: 08/14/2023 14:54     CODE STATUS:  Code Status History     Date Active Date Inactive Code Status Order ID Comments User Context   06/18/2023 1400 06/19/2023 0508 Full Code [id number]  Elene Griffes, MD HOV    Questions for Most Recent Historical Code Status (Order [id number])     Question Answer   By: Consent: discussion documented in EHR            Orders Placed This Encounter  Procedures   CBC with Differential (Cancer Center Only)    Standing Status:   Future    Expected Date:   08/27/2023    Expiration Date:   08/26/2024   Basic Metabolic Panel - Cancer  Center Only    Standing Status:   Future    Expected Date:   08/27/2023    Expiration Date:   08/26/2024   Magnesium     Standing Status:   Future    Expected Date:   08/27/2023    Expiration Date:   08/26/2024     Future Appointments  Date Time Provider Department Center  08/23/2023  8:30 AM [facility] LINAC 4 [facility] None  08/23/2023  8:45 AM LINAC-SQUIRE [facility] None  08/23/2023  9:30 AM [facility] INFUSION [facility] None  08/24/2023  8:30 AM [facility] LINAC 4 [facility] None  08/25/2023  8:30 AM  [facility] LINAC 4 [facility] None  08/25/2023 10:00 AM [facility] INFUSION [facility] None  08/25/2023 10:30 AM Woody Heading, RD [facility] None  08/26/2023  8:30 AM [facility] LINAC 4 [facility] None  08/27/2023  8:00 AM [facility] FLUSH [facility] None  08/27/2023  8:30 AM [facility] LINAC 4 [facility] None  08/27/2023  9:00 AM Abdul, [place], LCSW [facility] None  08/27/2023  9:00 AM Teja Costen, MD [facility] None  08/27/2023  9:30 AM [facility] INFUSION [facility] None  08/30/2023  8:30 AM [facility] LINAC 4 [facility] None  08/31/2023  8:15 AM [facility] LINAC 4 [facility] None  09/01/2023  8:30 AM [facility] LINAC 4 [facility] None  09/02/2023  8:15 AM [facility] LINAC 4 [facility] None  09/03/2023  8:30 AM [facility] LINAC 4 [facility] None  09/16/2023  9:00 AM Breedlove Blue, Blaire L, PT [facility] None     This document was completed utilizing speech recognition software. Grammatical errors, random word insertions, pronoun errors, and incomplete sentences are an occasional consequence of this system due to software limitations, ambient noise, and hardware issues. Any formal questions or concerns about the content, text or information contained within the body of this dictation should be directly addressed to the provider for clarification.

## 2023-08-20 NOTE — Progress Notes (Signed)
 Nutrition Follow-up:  Pt with stage II SCC of right tonsil, p16 positive. He is planning to receive concurrent chemoradiation (first chemo 3/21). First RT planned 3/24. Patient is under the care of Dr. Lurena Sally and Dr. Randye Buttner.   S/p Gtube 2/28 DME: [facility]  Patient has completed 6 of 7 planned chemotherapy. Radiation held today. He is receiving IVF today. Met briefly with patient in infusion. He is visibly in pain at visit. Tearful and shaky reports coughing spell before arrival. Patient says he coughed up a lot of bloody mucous. "It got everywhere." Patient said he had an okay day yesterday. Reports roof of mouth is raw and it is painful to talk. Wife had stepped out to run an errand. Unable to obtain recall today.   Medications: reviewed   Labs: Mg 1.5, Na 131, glucose 125, Cr 0.53  Anthropometrics: Wt 262 lb today   4/21 - 266 lb 1.6 4/11 - 269 lb 1.6 oz   Estimated Energy Needs  Kcals: 2800-3180 Protein: 140-153 Fluid: >2.8 L  NUTRITION DIAGNOSIS: Unintended wt loss - ongoing    INTERVENTION:  Continue working to increase TF to goal as tolerated Continue supportive care with IVF Continue baking soda salt water  rinses Support and encouragement   MONITORING, EVALUATION, GOAL: wt trends, intake   NEXT VISIT: Wednesday May 7 during infusion

## 2023-08-23 ENCOUNTER — Ambulatory Visit
Admission: RE | Admit: 2023-08-23 | Discharge: 2023-08-23 | Disposition: A | Discharge status: Home / Self Care | Source: Ambulatory Visit | Attending: Radiation Oncology | Admitting: Radiation Oncology

## 2023-08-23 ENCOUNTER — Other Ambulatory Visit: Payer: Self-pay

## 2023-08-23 ENCOUNTER — Inpatient Hospital Stay

## 2023-08-23 ENCOUNTER — Other Ambulatory Visit (HOSPITAL_COMMUNITY): Payer: Self-pay

## 2023-08-23 VITALS — BP 108/66 | HR 110 | Resp 18

## 2023-08-23 DIAGNOSIS — Z923 Personal history of irradiation: Secondary | ICD-10-CM | POA: Insufficient documentation

## 2023-08-23 DIAGNOSIS — C090 Malignant neoplasm of tonsillar fossa: Secondary | ICD-10-CM | POA: Diagnosis not present

## 2023-08-23 DIAGNOSIS — Z79891 Long term (current) use of opiate analgesic: Secondary | ICD-10-CM | POA: Diagnosis not present

## 2023-08-23 DIAGNOSIS — Z931 Gastrostomy status: Secondary | ICD-10-CM | POA: Diagnosis not present

## 2023-08-23 DIAGNOSIS — Z87891 Personal history of nicotine dependence: Secondary | ICD-10-CM | POA: Diagnosis not present

## 2023-08-23 DIAGNOSIS — M546 Pain in thoracic spine: Secondary | ICD-10-CM | POA: Insufficient documentation

## 2023-08-23 DIAGNOSIS — Z7982 Long term (current) use of aspirin: Secondary | ICD-10-CM | POA: Insufficient documentation

## 2023-08-23 DIAGNOSIS — R1310 Dysphagia, unspecified: Secondary | ICD-10-CM | POA: Diagnosis not present

## 2023-08-23 DIAGNOSIS — Z79899 Other long term (current) drug therapy: Secondary | ICD-10-CM | POA: Diagnosis not present

## 2023-08-23 DIAGNOSIS — G893 Neoplasm related pain (acute) (chronic): Secondary | ICD-10-CM | POA: Insufficient documentation

## 2023-08-23 DIAGNOSIS — C61 Malignant neoplasm of prostate: Secondary | ICD-10-CM | POA: Insufficient documentation

## 2023-08-23 DIAGNOSIS — Z5111 Encounter for antineoplastic chemotherapy: Secondary | ICD-10-CM | POA: Insufficient documentation

## 2023-08-23 DIAGNOSIS — Z510 Encounter for antineoplastic radiation therapy: Secondary | ICD-10-CM | POA: Insufficient documentation

## 2023-08-23 DIAGNOSIS — Z8546 Personal history of malignant neoplasm of prostate: Secondary | ICD-10-CM | POA: Insufficient documentation

## 2023-08-23 DIAGNOSIS — F419 Anxiety disorder, unspecified: Secondary | ICD-10-CM | POA: Insufficient documentation

## 2023-08-23 DIAGNOSIS — Z95828 Presence of other vascular implants and grafts: Secondary | ICD-10-CM | POA: Insufficient documentation

## 2023-08-23 LAB — RAD ONC ARIA SESSION SUMMARY
Course Elapsed Days: 42
Plan Fractions Treated to Date: 26
Plan Prescribed Dose Per Fraction: 2 Gy
Plan Total Fractions Prescribed: 35
Plan Total Prescribed Dose: 70 Gy
Reference Point Dosage Given to Date: 52 Gy
Reference Point Session Dosage Given: 2 Gy
Session Number: 26

## 2023-08-23 MED ORDER — SODIUM CHLORIDE 0.9 % IV SOLN: Freq: Once | INTRAVENOUS | Status: AC

## 2023-08-23 NOTE — Patient Instructions (Signed)
 Dehydration, Adult Dehydration is a condition in which there is not enough water or other fluids in the body. This happens when a person loses more fluids than they take in. Important organs cannot work right without the right amount of fluids. Any loss of fluids from the body can cause dehydration. Dehydration can be mild, worse, or very bad. It should be treated right away to keep it from getting very bad. What are the causes? Conditions that cause loss of water in the body. They include: Watery poop (diarrhea). Vomiting. Sweating a lot. Fever. Infection. Peeing (urinating) a lot. Not drinking enough fluids. Certain medicines, such as medicines that take extra fluid out of the body (diuretics). Lack of safe drinking water. Not being able to get enough water and food. What increases the risk? Having a long-term (chronic) illness that has not been treated the right way, such as: Diabetes. Heart disease. Kidney disease. Being 25 years of age or older. Having a disability. Living in a place that is high above the ground or sea (high in altitude). The thinner, drier air causes more fluid loss. Doing exercises that put stress on your body for a long time. Being active when in hot places. What are the signs or symptoms? Symptoms of dehydration depend on how bad it is. Mild or worse dehydration Thirst. Dry lips or dry mouth. Feeling dizzy or light-headed. Muscle cramps. Passing little pee or dark pee. Pee may be the color of tea. Headache. Very bad dehydration Changes in skin. Skin may: Be cold to the touch (clammy). Be blotchy or pale. Not go back to normal right after you pinch it and let it go. Little or no tears, pee, or sweat. Fast breathing. Low blood pressure. Weak pulse. Pulse that is more than 100 beats a minute when you are sitting still. Other changes, such as: Feeling very thirsty. Eyes that look hollow (sunken). Cold hands and feet. Being confused. Being very  tired (lethargic) or having trouble waking from sleep. Losing weight. Loss of consciousness. How is this treated? Treatment for this condition depends on how bad your dehydration is. Treatment should start right away. Do not wait until your condition gets very bad. Very bad dehydration is an emergency. You will need to go to a hospital. Mild or worse dehydration can be treated at home. You may be asked to: Drink more fluids. Drink an oral rehydration solution (ORS). This drink gives you the right amount of fluids, salts, and minerals (electrolytes). Very bad dehydration can be treated: With fluids through an IV tube. By correcting low levels of electrolytes in the body. By treating the problem that caused your dehydration. Follow these instructions at home: Oral rehydration solution If told by your doctor, drink an ORS: Make an ORS. Use instructions on the package. Start by drinking small amounts, about  cup (120 mL) every 5-10 minutes. Slowly drink more until you have had the amount that your doctor said to have.  Eating and drinking  Drink enough clear fluid to keep your pee pale yellow. If you were told to drink an ORS, finish the ORS first. Then, start slowly drinking other clear fluids. Drink fluids such as: Water. Do not drink only water. Doing that can make the salt (sodium) level in your body get too low. Water from ice chips you suck on. Fruit juice that you have added water to (diluted). Low-calorie sports drinks. Eat foods that have the right amounts of salts and minerals, such as bananas, oranges, potatoes,  tomatoes, or spinach. Do not drink alcohol. Avoid drinks that have caffeine or sugar. These include:: High-calorie sports drinks. Fruit juice that you did not add water to. Soda. Coffee or energy drinks. Avoid foods that are greasy or have a lot of fat or sugar. General instructions Take over-the-counter and prescription medicines only as told by your doctor. Do  not take sodium tablets. Doing that can make the salt level in your body get too high. Return to your normal activities as told by your doctor. Ask your doctor what activities are safe for you. Keep all follow-up visits. Your doctor may check and change your treatment. Contact a doctor if: You have pain in your belly (abdomen) and the pain: Gets worse. Stays in one place. You have a rash. You have a stiff neck. You get angry or annoyed more easily than normal. You are more tired or have a harder time waking than normal. You feel weak or dizzy. You feel very thirsty. Get help right away if: You have any symptoms of very bad dehydration. You vomit every time you eat or drink. Your vomiting gets worse, does not go away, or you vomit blood or green stuff. You are getting treatment, but symptoms are getting worse. You have a fever. You have a very bad headache. You have: Diarrhea that gets worse or does not go away. Blood in your poop (stool). This may cause poop to look black and tarry. No pee in 6-8 hours. Only a small amount of pee in 6-8 hours, and the pee is very dark. You have trouble breathing. These symptoms may be an emergency. Get help right away. Call 911. Do not wait to see if the symptoms will go away. Do not drive yourself to the hospital. This information is not intended to replace advice given to you by your health care provider. Make sure you discuss any questions you have with your health care provider. Document Revised: 11/03/2021 Document Reviewed: 11/03/2021 Elsevier Patient Education  2024 [employer].

## 2023-08-24 ENCOUNTER — Other Ambulatory Visit: Payer: Self-pay

## 2023-08-24 ENCOUNTER — Ambulatory Visit
Admission: RE | Admit: 2023-08-24 | Discharge: 2023-08-24 | Disposition: A | Discharge status: Home / Self Care | Source: Ambulatory Visit | Attending: Radiation Oncology

## 2023-08-24 DIAGNOSIS — Z7982 Long term (current) use of aspirin: Secondary | ICD-10-CM | POA: Diagnosis not present

## 2023-08-24 DIAGNOSIS — Z5111 Encounter for antineoplastic chemotherapy: Secondary | ICD-10-CM | POA: Diagnosis not present

## 2023-08-24 DIAGNOSIS — C090 Malignant neoplasm of tonsillar fossa: Secondary | ICD-10-CM | POA: Diagnosis not present

## 2023-08-24 DIAGNOSIS — M546 Pain in thoracic spine: Secondary | ICD-10-CM | POA: Diagnosis not present

## 2023-08-24 DIAGNOSIS — Z87891 Personal history of nicotine dependence: Secondary | ICD-10-CM | POA: Diagnosis not present

## 2023-08-24 DIAGNOSIS — G893 Neoplasm related pain (acute) (chronic): Secondary | ICD-10-CM | POA: Diagnosis not present

## 2023-08-24 DIAGNOSIS — Z923 Personal history of irradiation: Secondary | ICD-10-CM | POA: Diagnosis not present

## 2023-08-24 DIAGNOSIS — Z510 Encounter for antineoplastic radiation therapy: Secondary | ICD-10-CM | POA: Diagnosis not present

## 2023-08-24 DIAGNOSIS — R1310 Dysphagia, unspecified: Secondary | ICD-10-CM | POA: Diagnosis not present

## 2023-08-24 DIAGNOSIS — Z95828 Presence of other vascular implants and grafts: Secondary | ICD-10-CM | POA: Diagnosis not present

## 2023-08-24 DIAGNOSIS — Z79899 Other long term (current) drug therapy: Secondary | ICD-10-CM | POA: Diagnosis not present

## 2023-08-24 DIAGNOSIS — Z79891 Long term (current) use of opiate analgesic: Secondary | ICD-10-CM | POA: Diagnosis not present

## 2023-08-24 DIAGNOSIS — Z931 Gastrostomy status: Secondary | ICD-10-CM | POA: Diagnosis not present

## 2023-08-24 LAB — RAD ONC ARIA SESSION SUMMARY
Course Elapsed Days: 43
Plan Fractions Treated to Date: 27
Plan Prescribed Dose Per Fraction: 2 Gy
Plan Total Fractions Prescribed: 35
Plan Total Prescribed Dose: 70 Gy
Reference Point Dosage Given to Date: 54 Gy
Reference Point Session Dosage Given: 2 Gy
Session Number: 27

## 2023-08-25 ENCOUNTER — Other Ambulatory Visit: Payer: Self-pay

## 2023-08-25 ENCOUNTER — Ambulatory Visit
Admission: RE | Admit: 2023-08-25 | Discharge: 2023-08-25 | Disposition: A | Discharge status: Home / Self Care | Source: Ambulatory Visit | Attending: Radiation Oncology | Admitting: Radiation Oncology

## 2023-08-25 ENCOUNTER — Ambulatory Visit

## 2023-08-25 ENCOUNTER — Inpatient Hospital Stay: Admitting: Dietician

## 2023-08-25 ENCOUNTER — Inpatient Hospital Stay

## 2023-08-25 VITALS — BP 137/72 | HR 90 | Temp 97.9°F | Resp 18

## 2023-08-25 DIAGNOSIS — Z95828 Presence of other vascular implants and grafts: Secondary | ICD-10-CM | POA: Diagnosis not present

## 2023-08-25 DIAGNOSIS — C090 Malignant neoplasm of tonsillar fossa: Secondary | ICD-10-CM

## 2023-08-25 DIAGNOSIS — R1310 Dysphagia, unspecified: Secondary | ICD-10-CM | POA: Diagnosis not present

## 2023-08-25 DIAGNOSIS — Z5111 Encounter for antineoplastic chemotherapy: Secondary | ICD-10-CM | POA: Diagnosis not present

## 2023-08-25 DIAGNOSIS — Z923 Personal history of irradiation: Secondary | ICD-10-CM | POA: Diagnosis not present

## 2023-08-25 DIAGNOSIS — G893 Neoplasm related pain (acute) (chronic): Secondary | ICD-10-CM | POA: Diagnosis not present

## 2023-08-25 DIAGNOSIS — Z79891 Long term (current) use of opiate analgesic: Secondary | ICD-10-CM | POA: Diagnosis not present

## 2023-08-25 DIAGNOSIS — Z510 Encounter for antineoplastic radiation therapy: Secondary | ICD-10-CM | POA: Diagnosis not present

## 2023-08-25 DIAGNOSIS — Z7982 Long term (current) use of aspirin: Secondary | ICD-10-CM | POA: Diagnosis not present

## 2023-08-25 DIAGNOSIS — Z87891 Personal history of nicotine dependence: Secondary | ICD-10-CM | POA: Diagnosis not present

## 2023-08-25 DIAGNOSIS — Z931 Gastrostomy status: Secondary | ICD-10-CM | POA: Diagnosis not present

## 2023-08-25 DIAGNOSIS — M546 Pain in thoracic spine: Secondary | ICD-10-CM | POA: Diagnosis not present

## 2023-08-25 DIAGNOSIS — Z79899 Other long term (current) drug therapy: Secondary | ICD-10-CM | POA: Diagnosis not present

## 2023-08-25 LAB — RAD ONC ARIA SESSION SUMMARY
Course Elapsed Days: 44
Plan Fractions Treated to Date: 28
Plan Prescribed Dose Per Fraction: 2 Gy
Plan Total Fractions Prescribed: 35
Plan Total Prescribed Dose: 70 Gy
Reference Point Dosage Given to Date: 56 Gy
Reference Point Session Dosage Given: 2 Gy
Session Number: 28

## 2023-08-25 MED ORDER — SODIUM CHLORIDE 0.9 % IV SOLN: Freq: Once | INTRAVENOUS | Status: AC

## 2023-08-25 MED ORDER — FAMOTIDINE IN NACL 20-0.9 MG/50ML-% IV SOLN
20.0000 mg | Freq: Once | INTRAVENOUS | Status: AC
  Administered 2023-08-25: 20 mg via INTRAVENOUS
  Filled 2023-08-25: qty 50

## 2023-08-25 MED ORDER — ONDANSETRON HCL 4 MG/2ML IJ SOLN
8.0000 mg | Freq: Once | INTRAMUSCULAR | Status: AC
  Administered 2023-08-25: 8 mg via INTRAVENOUS
  Filled 2023-08-25: qty 4

## 2023-08-25 MED ORDER — HEPARIN SOD (PORK) LOCK FLUSH 100 UNIT/ML IV SOLN
500.0000 [IU] | Freq: Once | INTRAVENOUS | Status: AC
  Administered 2023-08-25: 500 [IU]

## 2023-08-25 MED ORDER — SODIUM CHLORIDE 0.9% FLUSH
10.0000 mL | Freq: Once | INTRAVENOUS | Status: AC
  Administered 2023-08-25: 10 mL

## 2023-08-25 NOTE — Progress Notes (Signed)
 Nutrition Follow-up:  Pt with stage II SCC of right tonsil, p16 positive. He is planning to receive concurrent chemoradiation (first chemo 3/21). First RT planned 3/24. Patient is under the care of Dr. Lurena Sally and Dr. Randye Buttner.   S/p Gtube 2/28 DME: [employer]  Chemo stopped after cycle 6. Patient had one week break from radiation. Resumed treatment 5/5. Final RT planned 5/16. Patient receiving supportive therapy with IVF.   Met with patient and wife in infusion. Patient reports slight improvement with break. Roof of mouth is painful, but not raw and bloody per wife. He is not sleeping well secondary to pain. Wife reports pt went to bed at [facility] last night. He reports phlegm/mucous getting caught in throat and unable to get it up and out. Wife reports receiving suction machine, however she is unsure of how to hook it up. She is not sure what company machine is from or who to contact for troubleshooting. Patient is doing baking soda salt water  rinses. He is not eating/drinking orally. Wife reports pt is tolerating 6-7 cartons of Nutren. She ordered protein modular. Currently giving 30 ml prosource daily (100 kcal, 15g). Pt denies constipation, diarrhea, nausea, vomiting.    Medications: reviewed   Labs: 5/2 - Na 131, glucose 125, Cr 0.53  Anthropometrics: No new wt for trends. Pt 262 lb on 5/2  4/21 - 266 lb 1.6 oz 4/11 - 269 lb 1.6 oz   Estimated Energy Needs  Kcals: 2800-3180 Protein: 140-153 Fluid: >2.8 L  NUTRITION DIAGNOSIS: Unintended wt loss - likely ongoing    INTERVENTION:  Continue working to increase tube feedings to goal as tolerated (8 cartons Nutren 1.5) Continue giving protein modular, recommend increasing to 30 ml prosource - 2x/day to aid with meeting nutrition goals Spoke with nurse navigator - suction machine from [employer] (company + contact information provided to wife) Support and encouragement     MONITORING, EVALUATION, GOAL: wt trends, TF   NEXT VISIT: Friday May 16  (after RT vs during IVF to be determined)

## 2023-08-26 ENCOUNTER — Ambulatory Visit
Admission: RE | Admit: 2023-08-26 | Discharge: 2023-08-26 | Disposition: A | Discharge status: Home / Self Care | Source: Ambulatory Visit | Attending: Radiation Oncology | Admitting: Radiation Oncology

## 2023-08-26 ENCOUNTER — Other Ambulatory Visit: Payer: Self-pay

## 2023-08-26 DIAGNOSIS — Z87891 Personal history of nicotine dependence: Secondary | ICD-10-CM | POA: Diagnosis not present

## 2023-08-26 DIAGNOSIS — G893 Neoplasm related pain (acute) (chronic): Secondary | ICD-10-CM | POA: Diagnosis not present

## 2023-08-26 DIAGNOSIS — M546 Pain in thoracic spine: Secondary | ICD-10-CM | POA: Diagnosis not present

## 2023-08-26 DIAGNOSIS — Z95828 Presence of other vascular implants and grafts: Secondary | ICD-10-CM | POA: Diagnosis not present

## 2023-08-26 DIAGNOSIS — Z931 Gastrostomy status: Secondary | ICD-10-CM | POA: Diagnosis not present

## 2023-08-26 DIAGNOSIS — Z79891 Long term (current) use of opiate analgesic: Secondary | ICD-10-CM | POA: Diagnosis not present

## 2023-08-26 DIAGNOSIS — Z5111 Encounter for antineoplastic chemotherapy: Secondary | ICD-10-CM | POA: Diagnosis not present

## 2023-08-26 DIAGNOSIS — Z7982 Long term (current) use of aspirin: Secondary | ICD-10-CM | POA: Diagnosis not present

## 2023-08-26 DIAGNOSIS — Z510 Encounter for antineoplastic radiation therapy: Secondary | ICD-10-CM | POA: Diagnosis not present

## 2023-08-26 DIAGNOSIS — Z923 Personal history of irradiation: Secondary | ICD-10-CM | POA: Diagnosis not present

## 2023-08-26 DIAGNOSIS — Z79899 Other long term (current) drug therapy: Secondary | ICD-10-CM | POA: Diagnosis not present

## 2023-08-26 DIAGNOSIS — R1310 Dysphagia, unspecified: Secondary | ICD-10-CM | POA: Diagnosis not present

## 2023-08-26 DIAGNOSIS — C090 Malignant neoplasm of tonsillar fossa: Secondary | ICD-10-CM | POA: Diagnosis not present

## 2023-08-26 LAB — RAD ONC ARIA SESSION SUMMARY
Course Elapsed Days: 45
Plan Fractions Treated to Date: 29
Plan Prescribed Dose Per Fraction: 2 Gy
Plan Total Fractions Prescribed: 35
Plan Total Prescribed Dose: 70 Gy
Reference Point Dosage Given to Date: 58 Gy
Reference Point Session Dosage Given: 2 Gy
Session Number: 29

## 2023-08-26 MED FILL — Fosaprepitant Dimeglumine For IV Infusion 150 MG (Base Eq): INTRAVENOUS | Qty: 5 | Status: AC

## 2023-08-27 ENCOUNTER — Ambulatory Visit
Admission: RE | Admit: 2023-08-27 | Discharge: 2023-08-27 | Disposition: A | Discharge status: Home / Self Care | Source: Ambulatory Visit | Attending: Radiation Oncology | Admitting: Radiation Oncology

## 2023-08-27 ENCOUNTER — Inpatient Hospital Stay

## 2023-08-27 ENCOUNTER — Encounter: Payer: Self-pay | Admitting: Oncology

## 2023-08-27 ENCOUNTER — Other Ambulatory Visit: Payer: Self-pay

## 2023-08-27 ENCOUNTER — Inpatient Hospital Stay (HOSPITAL_BASED_OUTPATIENT_CLINIC_OR_DEPARTMENT_OTHER)

## 2023-08-27 ENCOUNTER — Other Ambulatory Visit: Payer: Self-pay | Admitting: Oncology

## 2023-08-27 ENCOUNTER — Inpatient Hospital Stay: Admitting: Oncology

## 2023-08-27 ENCOUNTER — Ambulatory Visit

## 2023-08-27 ENCOUNTER — Other Ambulatory Visit (HOSPITAL_COMMUNITY): Payer: Self-pay

## 2023-08-27 VITALS — BP 97/64 | HR 116 | Temp 97.3°F | Resp 16 | Wt 262.2 lb

## 2023-08-27 DIAGNOSIS — G893 Neoplasm related pain (acute) (chronic): Secondary | ICD-10-CM

## 2023-08-27 DIAGNOSIS — C090 Malignant neoplasm of tonsillar fossa: Secondary | ICD-10-CM

## 2023-08-27 DIAGNOSIS — Z923 Personal history of irradiation: Secondary | ICD-10-CM | POA: Diagnosis not present

## 2023-08-27 DIAGNOSIS — Z7982 Long term (current) use of aspirin: Secondary | ICD-10-CM | POA: Diagnosis not present

## 2023-08-27 DIAGNOSIS — F418 Other specified anxiety disorders: Secondary | ICD-10-CM

## 2023-08-27 DIAGNOSIS — Z79899 Other long term (current) drug therapy: Secondary | ICD-10-CM | POA: Diagnosis not present

## 2023-08-27 DIAGNOSIS — Z510 Encounter for antineoplastic radiation therapy: Secondary | ICD-10-CM | POA: Diagnosis not present

## 2023-08-27 DIAGNOSIS — M546 Pain in thoracic spine: Secondary | ICD-10-CM | POA: Diagnosis not present

## 2023-08-27 DIAGNOSIS — R1310 Dysphagia, unspecified: Secondary | ICD-10-CM | POA: Diagnosis not present

## 2023-08-27 DIAGNOSIS — Z5111 Encounter for antineoplastic chemotherapy: Secondary | ICD-10-CM | POA: Diagnosis not present

## 2023-08-27 DIAGNOSIS — Z931 Gastrostomy status: Secondary | ICD-10-CM | POA: Diagnosis not present

## 2023-08-27 DIAGNOSIS — Z95828 Presence of other vascular implants and grafts: Secondary | ICD-10-CM | POA: Diagnosis not present

## 2023-08-27 DIAGNOSIS — Z79891 Long term (current) use of opiate analgesic: Secondary | ICD-10-CM | POA: Diagnosis not present

## 2023-08-27 DIAGNOSIS — Z87891 Personal history of nicotine dependence: Secondary | ICD-10-CM | POA: Diagnosis not present

## 2023-08-27 LAB — CBC WITH DIFFERENTIAL (CANCER CENTER ONLY)
Abs Immature Granulocytes: 0.09 10*3/uL — ABNORMAL HIGH (ref 0.00–0.07)
Basophils Absolute: 0 10*3/uL (ref 0.0–0.1)
Basophils Relative: 1 %
Eosinophils Absolute: 0 10*3/uL (ref 0.0–0.5)
Eosinophils Relative: 0 %
HCT: 26.8 % — ABNORMAL LOW (ref 39.0–52.0)
Hemoglobin: 8.9 g/dL — ABNORMAL LOW (ref 13.0–17.0)
Immature Granulocytes: 3 %
Lymphocytes Relative: 25 %
Lymphs Abs: 0.8 10*3/uL (ref 0.7–4.0)
MCH: 31.4 pg (ref 26.0–34.0)
MCHC: 33.2 g/dL (ref 30.0–36.0)
MCV: 94.7 fL (ref 80.0–100.0)
Monocytes Absolute: 0.5 10*3/uL (ref 0.1–1.0)
Monocytes Relative: 15 %
Neutro Abs: 1.8 10*3/uL (ref 1.7–7.7)
Neutrophils Relative %: 56 %
Platelet Count: 333 10*3/uL (ref 150–400)
RBC: 2.83 MIL/uL — ABNORMAL LOW (ref 4.22–5.81)
RDW: 17.3 % — ABNORMAL HIGH (ref 11.5–15.5)
WBC Count: 3.3 10*3/uL — ABNORMAL LOW (ref 4.0–10.5)
nRBC: 0 % (ref 0.0–0.2)

## 2023-08-27 LAB — RAD ONC ARIA SESSION SUMMARY
Course Elapsed Days: 46
Plan Fractions Treated to Date: 30
Plan Prescribed Dose Per Fraction: 2 Gy
Plan Total Fractions Prescribed: 35
Plan Total Prescribed Dose: 70 Gy
Reference Point Dosage Given to Date: 60 Gy
Reference Point Session Dosage Given: 2 Gy
Session Number: 30

## 2023-08-27 LAB — BASIC METABOLIC PANEL - CANCER CENTER ONLY
Anion gap: 8 (ref 5–15)
BUN: 17 mg/dL (ref 8–23)
CO2: 31 mmol/L (ref 22–32)
Calcium: 8.4 mg/dL — ABNORMAL LOW (ref 8.9–10.3)
Chloride: 97 mmol/L — ABNORMAL LOW (ref 98–111)
Creatinine: 0.48 mg/dL — ABNORMAL LOW (ref 0.61–1.24)
GFR, Estimated: >60 mL/min (ref >60)
Glucose, Bld: 139 mg/dL — ABNORMAL HIGH (ref 70–99)
Potassium: 4 mmol/L (ref 3.5–5.1)
Sodium: 136 mmol/L (ref 135–145)

## 2023-08-27 LAB — MAGNESIUM: Magnesium: 1.7 mg/dL (ref 1.7–2.4)

## 2023-08-27 MED ORDER — HYDROCODONE-ACETAMINOPHEN 7.5-325 MG/15ML PO SOLN
10.0000 mL | Freq: Four times a day (QID) | ORAL | 0 refills | Status: DC | PRN
Start: 2023-08-27 — End: 2023-11-19
  Filled 2023-08-27: qty 500, 13d supply, fill #0

## 2023-08-27 MED ORDER — SODIUM CHLORIDE 0.9% FLUSH
10.0000 mL | Freq: Once | INTRAVENOUS | Status: AC
Start: 2023-08-27 — End: 2023-08-27
  Administered 2023-08-27: 10 mL

## 2023-08-27 MED ORDER — SODIUM CHLORIDE 0.9 % IV SOLN: Freq: Once | INTRAVENOUS | Status: AC

## 2023-08-27 MED ORDER — ALPRAZOLAM 1 MG PO TABS: 0.5000 mg | ORAL_TABLET | Freq: Two times a day (BID) | ORAL | 0 refills | Status: DC | PRN

## 2023-08-27 NOTE — Assessment & Plan Note (Addendum)
 Please review oncology history for additional details and timeline of events.  Symptoms include severe pain and dysphagia since mid-November, progressively worsening.   cT3,cN2,cM0,p16+ tumor, Stage II disease.   His case was discussed in tumor conference on 06/09/2023.  Given the extent of disease, consensus opinion is to proceed with concurrent chemoradiation. We have discussed about role of cisplatin  being a radiosensitizer in the treatment of head and neck cancer.  We have discussed about the curative intent of chemoradiation for this patient.  Patient was willing to proceed with weekly cisplatin  after discussing risk versus benefits and side effect profile.  He started cycle 1 of cisplatin  at a dose of 40 mg/m on 07/09/2023.  Plan was to continue cisplatin  weekly during the course of radiation.  He began radiation treatments from 07/12/2023.  During week 2, chemotherapy had to be held because of severe fatigue with ECOG performance status of 3.  Ongoing treatment with chemotherapy and radiation. Radiation therapy is the primary treatment and will continue as planned. He is nearing the end of the treatment course, with a couple of weeks remaining. He expressed uncertainty about continuing treatment but was encouraged to proceed given the proximity to completion.  He was due for cycle 5 of chemotherapy 2 weeks ago.  However he developed neutropenia with ANC of 500.  Hence we held chemotherapy and arrange for Zarxio  as outpatient.  However patient got hospitalized with neutropenic fever.  No definite source of infection could be infected.  He was treated with appropriate antibiotics.  Also received Zarxio  while he was in the hospital.  Later he developed significant mucositis with bleeding occasionally.  He has been doing appropriate mouthwashes as recommended. He did receive a break from radiation therapy last week and we held chemo.  White count slowly improving.  He resumed radiation treatments  this week and he is scheduled to complete radiation treatments on 09/03/2023.  White count stable at 3300.  Patient does not want to go back on chemo because he is experiencing significant fatigue still.  We respect his wishes.  He was encouraged to continue radiation treatments daily as planned.  - Ensure home hydration (80-90 oz non-caffeinated fluids daily).  Patient's wife was encouraged to start using feeding tube as suggested by the nutritionist and also to use feeding tube for hydration.  - Monitor and replace electrolytes as needed  We will arrange for IV fluids at least twice a week on Mondays and Thursdays going forward.  RTC in 4 weeks for follow-up with repeat labs.

## 2023-08-27 NOTE — Patient Instructions (Signed)
Rehydration, Adult  Rehydration is the replacement of fluids, salts, and minerals in the body (electrolytes) that are lost during dehydration. Dehydration is when there is not enough water or other fluids in the body. This happens when you lose more fluids than you take in. People who are age 72 or older have a higher risk of dehydration than younger adults. This is because in older age, the body: Is less able to maintain the right amount of water. Does not respond to temperature changes as well. Does not get a sense of thirst as easily or quickly. Other causes include: Not drinking enough fluids. This can occur when you are ill, when you forget to drink, or when you are doing activities that require a lot of energy, especially in hot weather. Conditions that cause loss of water or other fluids. These include diarrhea, vomiting, sweating, or urinating a lot. Other illnesses, such as fever or infection. Certain medicines, such as those that remove excess fluid from the body (diuretics). Symptoms of mild or moderate dehydration may include thirst, dry lips and mouth, and dizziness. Symptoms of severe dehydration may include increased heart rate, confusion, fainting, and not urinating. In severe cases, you may need to get fluids through an IV at the hospital. For mild or moderate cases, you can usually rehydrate at home by drinking certain fluids as told by your health care provider. What are the risks? Rehydration is usually safe. Taking in too much fluid (overhydration) can be a problem but is rare. Overhydration can cause an imbalance of electrolytes in the body, kidney failure, fluid in the lungs, or a decrease in salt (sodium) levels in the body. Supplies needed: You will need an oral rehydration solution (ORS) if your health care provider tells you to use one. This is a drink to treat dehydration. It can be found in pharmacies and retail stores. How to rehydrate Fluids Follow instructions from  your health care provider about what to drink. The kind of fluid and the amount you should drink depend on your condition. In general, you should choose drinks that you prefer. If told by your health care provider, drink an ORS. Make an ORS by following instructions on the package. Start by drinking small amounts, about  cup (120 mL) every 5-10 minutes. Slowly increase how much you drink until you have taken in the amount recommended by your health care provider. Drink enough clear fluids to keep your urine pale yellow. If you were told to drink an ORS, finish it first, then start slowly drinking other clear fluids. Drink fluids such as: Water. This includes sparkling and flavored water. Drinking only water can lead to having too little sodium in your body (hyponatremia). Follow the advice of your health care provider. Water from ice chips you suck on. Fruit juice with water added to it(diluted). Sports drinks. Hot or cold herbal teas. Broth-based soups. Coffee. Milk or milk products. Food Follow instructions from your health care provider about what to eat while you rehydrate. Your health care provider may recommend that you slowly begin eating regular foods in small amounts. Eat foods that contain a healthy balance of electrolytes, such as bananas, oranges, potatoes, tomatoes, and spinach. Avoid foods that are greasy or contain a lot of sugar. In some cases, you may get nutrition through a feeding tube that is passed through your nose and into your stomach (nasogastric tube, or NG tube). This may be done if you have uncontrolled vomiting or diarrhea. Drinks to avoid  Certain drinks may make dehydration worse. While you rehydrate, avoid drinking alcohol. How to tell if you are recovering from dehydration You may be getting better if: You are urinating more often than before you started rehydrating. Your urine is pale yellow. Your energy level improves. You vomit less often. You have  diarrhea less often. Your appetite improves or returns to normal. You feel less dizzy or light-headed. Your skin tone and color start to look more normal. Follow these instructions at home: Take over-the-counter and prescription medicines only as told by your health care provider. Do not take sodium tablets. Doing this can lead to having too much sodium in your body (hypernatremia). Contact a health care provider if: You continue to have symptoms of mild or moderate dehydration, such as: Thirst. Dry lips. Slightly dry mouth. Dizziness. Dark urine or less urine than usual. Muscle cramps. You continue to vomit or have diarrhea. Get help right away if: You have symptoms of dehydration that get worse. You have a fever. You have a severe headache. You have been vomiting and have problems, such as: Your vomiting gets worse. Your vomit includes blood or green matter (bile). You cannot eat or drink without vomiting. You have problems with urination or bowel movements, such as: Diarrhea that gets worse. Blood in your stool (feces). This may cause stool to look black and tarry. Not urinating, or urinating only a small amount of very dark urine, within 6-8 hours. You have trouble breathing. You have symptoms that get worse with treatment. These symptoms may be an emergency. Get help right away. Call 911. Do not wait to see if the symptoms will go away. Do not drive yourself to the hospital. This information is not intended to replace advice given to you by your health care provider. Make sure you discuss any questions you have with your health care provider. Document Revised: 08/20/2021 Document Reviewed: 08/18/2021 Elsevier Patient Education  2024 [employer].

## 2023-08-27 NOTE — Progress Notes (Signed)
 [facility] CSW Progress Note  Visual merchandiser met with patient and spouse prior to medical provider appointment. Patient appeared fatigue and provided limited verbal contact. CSW spoke primarily with patient's spouse who stated patient is not sleeping well and is ready for treatment to be completed. Patient's spouse serves as caretaker and reported desire for increased rest. Patient and spouse have no immediate needs, one week of treatment remaining.   Isaac Sorrow, LCSW Clinical Social Worker [facility]

## 2023-08-27 NOTE — Progress Notes (Signed)
 [facility]  ONCOLOGY CLINIC PROGRESS NOTE   Patient Care Team: [name], [name], MD as PCP - General (Family Medicine) Katheleen Palmer, RN as Oncology Nurse Navigator Maris Sickle, MD as Referring Physician (Ophthalmology) Trent Frizzle, MD as Consulting Physician (Urology) Kenith Payer, MD as Consulting Physician (Radiation Oncology) Neda Balk, RN as Registered Nurse Debbie Fails, Laura Polio, NP as Nurse Practitioner (Hematology and Oncology) Colie Dawes, MD as Attending Physician (Radiation Oncology) Artice Last, MD as Consulting Physician (Otolaryngology) Malmfelt, Nancyann Aye, RN as Oncology Nurse Navigator Arlo Berber, MD as Consulting Physician (Oncology)  PATIENT NAME: Isaac Carpenter   MR#: [id number] DOB: April 23, 1950  Date of visit: 08/27/2023   ASSESSMENT & PLAN:   Isaac Carpenter is a 73 y.o. gentleman with a past medical history of prostate cancer diagnosed in March 2024, S/P radioactive seed implant/brachytherapy, hypertension, dyslipidemia, hypothyroidism, obstructive sleep apnea. He presented for follow up of recently diagnosed squamous cell carcinoma of the right tonsil, clinical stage II disease (cT3,cN2,cM0,p16+).   Malignant neoplasm of tonsillar fossa (HCC) Please review oncology history for additional details and timeline of events.  Symptoms include severe pain and dysphagia since mid-November, progressively worsening.   cT3,cN2,cM0,p16+ tumor, Stage II disease.   His case was discussed in tumor conference on 06/09/2023.  Given the extent of disease, consensus opinion is to proceed with concurrent chemoradiation. We have discussed about role of cisplatin  being a radiosensitizer in the treatment of head and neck cancer.  We have discussed about the curative intent of chemoradiation for this patient.  Patient was willing to proceed with weekly cisplatin  after discussing risk versus benefits and side effect profile.  He started  cycle 1 of cisplatin  at a dose of 40 mg/m on 07/09/2023.  Plan was to continue cisplatin  weekly during the course of radiation.  He began radiation treatments from 07/12/2023.  During week 2, chemotherapy had to be held because of severe fatigue with ECOG performance status of 3.  Ongoing treatment with chemotherapy and radiation. Radiation therapy is the primary treatment and will continue as planned. He is nearing the end of the treatment course, with a couple of weeks remaining. He expressed uncertainty about continuing treatment but was encouraged to proceed given the proximity to completion.  He was due for cycle 5 of chemotherapy 2 weeks ago.  However he developed neutropenia with ANC of 500.  Hence we held chemotherapy and arrange for Zarxio  as outpatient.  However patient got hospitalized with neutropenic fever.  No definite source of infection could be infected.  He was treated with appropriate antibiotics.  Also received Zarxio  while he was in the hospital.  Later he developed significant mucositis with bleeding occasionally.  He has been doing appropriate mouthwashes as recommended. He did receive a break from radiation therapy last week and we held chemo.  White count slowly improving.  He resumed radiation treatments this week and he is scheduled to complete radiation treatments on 09/03/2023.  White count stable at 3300.  Patient does not want to go back on chemo because he is experiencing significant fatigue still.  We respect his wishes.  He was encouraged to continue radiation treatments daily as planned.  - Ensure home hydration (80-90 oz non-caffeinated fluids daily).  Patient's wife was encouraged to start using feeding tube as suggested by the nutritionist and also to use feeding tube for hydration.  - Monitor and replace electrolytes as needed  We will arrange for IV fluids at least twice a  week on Mondays and Thursdays going forward.  RTC in 4 weeks for follow-up with  repeat labs.   Mucositis due to chemoradiation Mucositis due to radiation therapy causing discomfort and oral tissue buildup. A break from radiation and chemotherapy is expected to aid recovery. Symptomatic relief with baking soda and salt rinses, lidocaine , and magic mouthwash. - Continue baking soda and salt rinses - Use lidocaine  for numbing - Alternate with magic mouthwash - Evaluate mucositis with Dr. Lurena Sally on Monday   I reviewed lab results and outside records for this visit and discussed relevant results with the patient. Diagnosis, plan of care and treatment options were also discussed in detail with the patient. Opportunity provided to ask questions and answers provided to his apparent satisfaction. Provided instructions to call our clinic with any problems, questions or concerns prior to return visit. I recommended to continue follow-up with PCP and sub-specialists. He verbalized understanding and agreed with the plan.   NCCN guidelines have been consulted in the planning of this patient's care.  I spent a total of 30 minutes during this encounter with the patient including review of chart and various tests results, discussions about plan of care and coordination of care plan.   Arlo Berber, MD  08/27/2023 4:40 PM  [facility] - A DEPT OF [facility] [address] [place] [address] Dept: [phone] Dept Fax: [phone]    CHIEF COMPLAINT/ REASON FOR VISIT:   Follow-up for squamous cell carcinoma of the right tonsil, stage II disease (cT3,cN2,cM0,p16+)  Current Treatment: Concurrent chemoradiation with weekly cisplatin  started from 07/09/2023.  INTERVAL HISTORY:    Discussed the use of AI scribe software for clinical note transcription with the patient, who gave verbal consent to proceed.   Isaac Carpenter is here today for repeat clinical assessment.   He started concurrent chemoradiation with  weekly cisplatin  on 07/09/2023.  History of Present Illness Isaac Carpenter "Isaac Carpenter" is a 73 year old male undergoing radiation therapy who presents with increased fatigue and sleep disturbances.  He resumed radiation therapy on Monday and has since experienced increased fatigue and significant sleep disturbances, unable to sleep longer than a few minutes at a time. The sleep disturbances are due to a burning sensation in his mouth and frequent nocturia.  He experiences burning in his mouth and tongue, managed with mouth rinses using baking soda and salt, and lidocaine -based numbing medicine or 'magic mouthwash.' The lidocaine  provides temporary relief but causes a sensation of saliva buildup, leading to spitting.  He has completed four cycles of chemotherapy, with interruptions due to low blood counts. Current blood work shows a white blood cell count of 3,300, hemoglobin at 8.9, and normal platelet levels at 333,000. Neutrophils are at 1,800, and kidney function and electrolytes are stable.  He uses hydrocodone -acetaminophen  for pain management, but there are no refills available. Improved sleep would significantly enhance his well-being.  Prior to treatment, he experienced drooping of his eye on the affected side, which he inquires about as a potential symptom of his cancer. He also mentions that the roof of his mouth felt as though it 'came off' last week, although bleeding has mostly stopped.  No ulcers or infections in the mouth.    I have reviewed the past medical history, past surgical history, social history and family history with the patient and they are unchanged from previous note.  HISTORY OF PRESENT ILLNESS:   ONCOLOGY HISTORY:  He presented to his PCP Dr Betty [name] on 05-17-23 with complains of right-sided jaw/maxillary pain that tends to occur when coughing. Pain had persisted for several months at that time following an upper respiratory infection.    Subsequently, he  underwent a CT soft tissue neck on 05-17-23 which revealed an ulcerated enhancing mass involving the superior oropharynx and soft palate/uvula measuring 3.4 x 3.0 x 3.6 cm in the greatest extent that is concerning for malignancy. No evidence of cervical lymphadenopathy was indicated on scan    In light of findings, the patient saw Dr. Artice Last on 05-27-23 with worsening symptoms. During his visit, he underwent a flexible fiberoptic laryngoscopy with several biopsies of the friable tissue from the right oropharyngeal mass.     Biopsy of right oropharynx mass on 05-27-23 revealed: Invasive moderately differentiated squamous cell carcinoma. Immunohistochemistry for p16 shows diffuse strong positivity.    During most recent visit with Dr. Soldatova on 06-04-23, patient reported constant worsening symptoms of odynophagia and dysphagia. Stating that he's unable to maintain adequate nutrition as food typically consists of few bites of soft food and liquids. Pain does not seem to be managed by pain relievers including oxycodone . To further evaluate the extent of the disease, he underwent a PET scan performed on 06-07-23 revealing a right  oropharyngeal tumor >4cm, likely b/l level II adenopathy, no distant metastases.    cT3,cN2,cM0,p16+ tumor, Stage II disease.    Plan made for concurrent chemoradiation with weekly cisplatin . Started Cisplatin  from 07/09/2023 and radiation from 07/12/23.   Oncology History  Malignant neoplasm of prostate (HCC)  05/13/2022 Cancer Staging   Staging form: Prostate, AJCC 8th Edition - Clinical stage from 05/13/2022: Stage IIC (cT1c, cN0, cM0, PSA: 13.2, Grade Group: 3) - Signed by Keitha Pata, PA-C on 07/17/2022 Histopathologic type: Adenocarcinoma, NOS Stage prefix: Initial diagnosis Prostate specific antigen (PSA) range: 10 to 19 Gleason primary pattern: 4 Gleason secondary pattern: 3 Gleason score: 7 Histologic grading system: 5 grade system Number of biopsy cores  examined: 12 Number of biopsy cores positive: 4 Location of positive needle core biopsies: Both sides   07/17/2022 Initial Diagnosis   Malignant neoplasm of prostate (HCC)   Malignant neoplasm of tonsillar fossa (HCC)  06/08/2023 Initial Diagnosis   Malignant neoplasm of tonsillar fossa (HCC)   06/08/2023 Cancer Staging   Staging form: Pharynx - HPV-Mediated Oropharynx, AJCC 8th Edition - Clinical stage from 06/08/2023: Stage II (cT3, cN2, cM0, p16+) - Signed by Colie Dawes, MD on 06/08/2023 Stage prefix: Initial diagnosis   07/09/2023 -  Chemotherapy   Patient is on Treatment Plan : HEAD/NECK Cisplatin  (40) q7d         REVIEW OF SYSTEMS:   Review of Systems - Oncology  All other pertinent systems were reviewed with the patient and are negative.  ALLERGIES: He is allergic to other and testosterone.  MEDICATIONS:  Current Outpatient Medications  Medication Sig Dispense Refill   ALPRAZolam  (XANAX ) 1 MG tablet Take 0.5 tablets (0.5 mg total) by mouth 2 (two) times daily as needed for anxiety. 60 tablet 0   aspirin 81 MG chewable tablet Chew 81 mg by mouth daily.     doxazosin  (CARDURA ) 1 MG tablet Take 1 tablet (1 mg total) by mouth at bedtime. 30 tablet 1   esomeprazole  (NEXIUM ) 40 MG capsule Take 1 capsule (40 mg total) by mouth daily at 12 noon. See instructions to administer through Tube. 30 capsule 0   fluconazole  (DIFLUCAN ) 40 MG/ML suspension Take 5mL  today, then 2.5mL daily for 20 more days. HOLD ATORVASTATIN  WHILE ON THIS. 55 mL 0   levothyroxine  (SYNTHROID ) 88 MCG tablet TAKE ONE TABLET BY MOUTH DAILY. 90 tablet 2   lidocaine  (XYLOCAINE ) 2 % solution Patient: Mix 1part 2% viscous lidocaine , 1part water . Swish and spit 10mL of diluted mixture every 2 hours as needed. 300 mL 3   lidocaine -prilocaine  (EMLA ) cream Apply to affected area once 30 g 3   metoCLOPramide  (REGLAN ) 10 MG/10ML SOLN Place 10 mLs (10 mg total) into feeding tube 3 (three) times daily before meals. 450 mL  1   Nutritional Supplements (NUTREN 1.5) LIQD 2 cartons Nutren 1.5 (500 ml) QID via tube. Flush with 60 ml water  before/after each bolus. Provide additional 250 ml water  flush 4x/day in between feedings to meet hydration needs. Provides 3000 kcal, 136 g protein, 1528 ml free water  (3008 ml total water ) 2000 ml/day meets 100% DRI     solifenacin (VESICARE) 5 MG tablet Take 5 mg by mouth daily.     dexamethasone  (DEXAMETHASONE  INTENSOL) 1 MG/ML solution TAKE BY MOUTH OR G-TUBE ONCE DAILY FOR THREE DAYS AFTER CHEMO (Patient not taking: Reported on 08/27/2023) 30 mL 0   HYDROcodone -acetaminophen  (HYCET) 7.5-325 mg/15 ml solution Take 10 mLs by mouth 4 (four) times daily as needed for moderate pain (pain score 4-6). 500 mL 0   ondansetron  (ZOFRAN -ODT) 8 MG disintegrating tablet Take 1 tablet (8 mg total) by mouth every 8 (eight) hours as needed for nausea or vomiting. (Patient not taking: Reported on 08/27/2023) 30 tablet 2   prochlorperazine  (COMPAZINE ) 10 MG tablet Take 1 tablet (10 mg total) by mouth every 6 (six) hours as needed (Nausea or vomiting). (Patient not taking: Reported on 08/27/2023) 30 tablet 1   No current facility-administered medications for this visit.     VITALS:   Blood pressure 97/64, pulse (!) 116, temperature (!) 97.3 F (36.3 C), temperature source Temporal, resp. rate 16, weight 262 lb 3.2 oz (118.9 kg), SpO2 98%.  Wt Readings from Last 3 Encounters:  08/27/23 262 lb 3.2 oz (118.9 kg)  08/20/23 262 lb (118.8 kg)  08/17/23 266 lb 1.5 oz (120.7 kg)    Body mass index is 34.59 kg/m.    Onc Performance Status - 08/27/23 0910       ECOG Perf Status   ECOG Perf Status Capable of only limited selfcare, confined to bed or chair more than 50% of waking hours      KPS SCALE   KPS % SCORE Requires considerable assistance, and frequent medical care                PHYSICAL EXAM:   Physical Exam Constitutional:      General: He is not in acute distress.     Comments: Appears tired.  Presented to clinic in a wheelchair today  HENT:     Head: Normocephalic and atraumatic.     Mouth/Throat:     Comments: Right oropharynx tonsil tumor eroding through soft palate, tumor reaches uvula, uvula swollen Eyes:     General: No scleral icterus.    Conjunctiva/sclera: Conjunctivae normal.  Cardiovascular:     Rate and Rhythm: Normal rate and regular rhythm.     Heart sounds: Normal heart sounds.  Pulmonary:     Effort: Pulmonary effort is normal.     Breath sounds: Normal breath sounds.  Chest:     Comments: Right sided Port-A-Cath on place without any signs of infection Abdominal:  General: There is no distension.     Comments: Feeding tube in place  Musculoskeletal:     Right lower leg: No edema.     Left lower leg: No edema.  Neurological:     General: No focal deficit present.     Mental Status: He is alert and oriented to person, place, and time.      LABORATORY DATA:   I have reviewed the data as listed.  Results for orders placed or performed in visit on 08/27/23  Rad Onc Aria Session Summary  Result Value Ref Range   Course ID [id number]    Course Start Date 07/05/2023    Session Number 30    Course First Treatment Date 07/12/2023  8:47 AM    Course Last Treatment Date 08/27/2023  8:21 AM    Course Elapsed Days 46    Reference Point ID HN [id number]    Reference Point Dosage Given to Date 60.0000003 Gy   Reference Point Session Dosage Given 2.00000001 Gy   Plan ID [email or web address]    Plan Fractions Treated to Date 30    Plan Total Fractions Prescribed 35    Plan Prescribed Dose Per Fraction 2 Gy   Plan Total Prescribed Dose 70.000000 Gy   Plan Primary Reference Point HN dp   Results for orders placed or performed in visit on 08/27/23  Magnesium   Result Value Ref Range   Magnesium  1.7 1.7 - 2.4 mg/dL  Basic Metabolic Panel - Cancer Center Only  Result Value Ref Range   Sodium 136 135 - 145 mmol/L   Potassium 4.0 3.5 - 5.1 mmol/L    Chloride 97 (L) 98 - 111 mmol/L   CO2 31 22 - 32 mmol/L   Glucose, Bld 139 (H) 70 - 99 mg/dL   BUN 17 8 - 23 mg/dL   Creatinine 6.23 (L) 7.62 - 1.24 mg/dL   Calcium  8.4 (L) 8.9 - 10.3 mg/dL   GFR, Estimated >83 >15 mL/min   Anion gap 8 5 - 15  CBC with Differential (Cancer Center Only)  Result Value Ref Range   WBC Count 3.3 (L) 4.0 - 10.5 K/uL   RBC 2.83 (L) 4.22 - 5.81 MIL/uL   Hemoglobin 8.9 (L) 13.0 - 17.0 g/dL   HCT 17.6 (L) 16.0 - 73.7 %   MCV 94.7 80.0 - 100.0 fL   MCH 31.4 26.0 - 34.0 pg   MCHC 33.2 30.0 - 36.0 g/dL   RDW 10.6 (H) 26.9 - 48.5 %   Platelet Count 333 150 - 400 K/uL   nRBC 0.0 0.0 - 0.2 %   Neutrophils Relative % 56 %   Neutro Abs 1.8 1.7 - 7.7 K/uL   Lymphocytes Relative 25 %   Lymphs Abs 0.8 0.7 - 4.0 K/uL   Monocytes Relative 15 %   Monocytes Absolute 0.5 0.1 - 1.0 K/uL   Eosinophils Relative 0 %   Eosinophils Absolute 0.0 0.0 - 0.5 K/uL   Basophils Relative 1 %   Basophils Absolute 0.0 0.0 - 0.1 K/uL   Immature Granulocytes 3 %   Abs Immature Granulocytes 0.09 (H) 0.00 - 0.07 K/uL    RADIOGRAPHIC STUDIES:  I have personally reviewed the radiological images as listed and agree with the findings in the report.  DG Chest Port 1 View Result Date: 08/14/2023 CLINICAL DATA:  Possible sepsis and fevers EXAM: PORTABLE CHEST 1 VIEW COMPARISON:  08/04/2018 FINDINGS: Cardiac shadow is within normal limits somewhat accentuated by the portable technique. Right  chest wall port is noted. Lungs are well aerated bilaterally. No focal infiltrate or effusion is seen. No bony abnormality is noted. IMPRESSION: No active disease. Electronically Signed   By: Violeta Grey M.D.   On: 08/14/2023 14:54     CODE STATUS:  Code Status History     Date Active Date Inactive Code Status Order ID Comments User Context   06/18/2023 1400 06/19/2023 0508 Full Code [id number]  Elene Griffes, MD HOV    Questions for Most Recent Historical Code Status (Order [id number])     Question  Answer   By: Consent: discussion documented in EHR            No orders of the defined types were placed in this encounter.    Future Appointments  Date Time Provider Department Center  08/30/2023  7:30 AM [facility] INFUSION [facility] None  08/30/2023  8:30 AM [facility] 4 [facility] None  08/30/2023  8:45 AM [facility]-SQUIRE [facility] None  08/31/2023  8:15 AM [facility] LINAC 4 [facility] None  09/01/2023  7:30 AM [facility] INFUSION [facility] None  09/01/2023  8:30 AM [facility] LINAC 4 [facility] None  09/02/2023  8:15 AM [facility] LINAC 4 [facility] None  09/02/2023  8:30 AM Schinke, Carl B, CCC-SLP [facility]  09/03/2023  8:00 AM [facility] INFUSION [facility] None  09/03/2023  8:30 AM [facility] LINAC 4 [facility] None  09/03/2023  9:00 AM Woody Heading, RD [facility] None  09/16/2023  9:00 AM Breedlove Junnie Olives, PT [facility] None     This document was completed utilizing speech recognition software. Grammatical errors, random word insertions, pronoun errors, and incomplete sentences are an occasional consequence of this system due to software limitations, ambient noise, and hardware issues. Any formal questions or concerns about the content, text or information contained within the body of this dictation should be directly addressed to the provider for clarification.

## 2023-08-27 NOTE — Progress Notes (Unsigned)
 Patient seen by Dr. Gale Jude Pasam today  Vitals are within treatment parameters:Yes   Labs are within treatment parameters: Yes   Treatment plan has been signed: Yes   Per physician team, Patient will not be receiving treatment today.   Instead, pt will receive supportive care with IVF.

## 2023-08-30 ENCOUNTER — Ambulatory Visit
Admission: RE | Admit: 2023-08-30 | Discharge: 2023-08-30 | Disposition: A | Discharge status: Home / Self Care | Source: Ambulatory Visit | Attending: Radiation Oncology

## 2023-08-30 ENCOUNTER — Ambulatory Visit
Admission: RE | Admit: 2023-08-30 | Discharge: 2023-08-30 | Disposition: A | Discharge status: Home / Self Care | Source: Ambulatory Visit | Attending: Radiation Oncology | Admitting: Radiation Oncology

## 2023-08-30 ENCOUNTER — Inpatient Hospital Stay

## 2023-08-30 ENCOUNTER — Other Ambulatory Visit: Payer: Self-pay | Admitting: Radiation Oncology

## 2023-08-30 ENCOUNTER — Other Ambulatory Visit (HOSPITAL_COMMUNITY): Payer: Self-pay

## 2023-08-30 ENCOUNTER — Ambulatory Visit

## 2023-08-30 ENCOUNTER — Other Ambulatory Visit: Payer: Self-pay

## 2023-08-30 VITALS — BP 125/81 | HR 99 | Temp 99.7°F | Resp 16

## 2023-08-30 DIAGNOSIS — Z923 Personal history of irradiation: Secondary | ICD-10-CM | POA: Diagnosis not present

## 2023-08-30 DIAGNOSIS — Z95828 Presence of other vascular implants and grafts: Secondary | ICD-10-CM | POA: Diagnosis not present

## 2023-08-30 DIAGNOSIS — Z79899 Other long term (current) drug therapy: Secondary | ICD-10-CM | POA: Diagnosis not present

## 2023-08-30 DIAGNOSIS — G893 Neoplasm related pain (acute) (chronic): Secondary | ICD-10-CM | POA: Diagnosis not present

## 2023-08-30 DIAGNOSIS — C090 Malignant neoplasm of tonsillar fossa: Secondary | ICD-10-CM

## 2023-08-30 DIAGNOSIS — M546 Pain in thoracic spine: Secondary | ICD-10-CM | POA: Diagnosis not present

## 2023-08-30 DIAGNOSIS — Z510 Encounter for antineoplastic radiation therapy: Secondary | ICD-10-CM | POA: Diagnosis not present

## 2023-08-30 DIAGNOSIS — Z7982 Long term (current) use of aspirin: Secondary | ICD-10-CM | POA: Diagnosis not present

## 2023-08-30 DIAGNOSIS — Z931 Gastrostomy status: Secondary | ICD-10-CM | POA: Diagnosis not present

## 2023-08-30 DIAGNOSIS — R1310 Dysphagia, unspecified: Secondary | ICD-10-CM | POA: Diagnosis not present

## 2023-08-30 DIAGNOSIS — Z79891 Long term (current) use of opiate analgesic: Secondary | ICD-10-CM | POA: Diagnosis not present

## 2023-08-30 DIAGNOSIS — Z87891 Personal history of nicotine dependence: Secondary | ICD-10-CM | POA: Diagnosis not present

## 2023-08-30 DIAGNOSIS — Z5111 Encounter for antineoplastic chemotherapy: Secondary | ICD-10-CM | POA: Diagnosis not present

## 2023-08-30 LAB — RAD ONC ARIA SESSION SUMMARY
Course Elapsed Days: 49
Plan Fractions Treated to Date: 31
Plan Prescribed Dose Per Fraction: 2 Gy
Plan Total Fractions Prescribed: 35
Plan Total Prescribed Dose: 70 Gy
Reference Point Dosage Given to Date: 62 Gy
Reference Point Session Dosage Given: 2 Gy
Session Number: 31

## 2023-08-30 MED ORDER — SODIUM CHLORIDE 0.9% FLUSH
10.0000 mL | Freq: Once | INTRAVENOUS | Status: DC
Start: 2023-08-30 — End: 2023-08-30

## 2023-08-30 MED ORDER — ONDANSETRON HCL 4 MG/2ML IJ SOLN
8.0000 mg | Freq: Once | INTRAMUSCULAR | Status: AC
  Administered 2023-08-30: 8 mg via INTRAVENOUS
  Filled 2023-08-30: qty 4

## 2023-08-30 MED ORDER — FAMOTIDINE IN NACL 20-0.9 MG/50ML-% IV SOLN
20.0000 mg | Freq: Once | INTRAVENOUS | Status: AC
  Administered 2023-08-30: 20 mg via INTRAVENOUS
  Filled 2023-08-30: qty 50

## 2023-08-30 MED ORDER — SODIUM CHLORIDE 0.9 % IV SOLN: Freq: Once | INTRAVENOUS | Status: AC

## 2023-08-31 ENCOUNTER — Other Ambulatory Visit: Payer: Self-pay

## 2023-08-31 ENCOUNTER — Ambulatory Visit
Admission: RE | Admit: 2023-08-31 | Discharge: 2023-08-31 | Disposition: A | Discharge status: Home / Self Care | Source: Ambulatory Visit | Attending: Radiation Oncology | Admitting: Radiation Oncology

## 2023-08-31 ENCOUNTER — Other Ambulatory Visit: Payer: Self-pay | Admitting: Radiology

## 2023-08-31 ENCOUNTER — Other Ambulatory Visit (HOSPITAL_COMMUNITY): Payer: Self-pay

## 2023-08-31 DIAGNOSIS — Z931 Gastrostomy status: Secondary | ICD-10-CM | POA: Diagnosis not present

## 2023-08-31 DIAGNOSIS — M546 Pain in thoracic spine: Secondary | ICD-10-CM | POA: Diagnosis not present

## 2023-08-31 DIAGNOSIS — C090 Malignant neoplasm of tonsillar fossa: Secondary | ICD-10-CM | POA: Diagnosis not present

## 2023-08-31 DIAGNOSIS — Z510 Encounter for antineoplastic radiation therapy: Secondary | ICD-10-CM | POA: Diagnosis not present

## 2023-08-31 DIAGNOSIS — Z87891 Personal history of nicotine dependence: Secondary | ICD-10-CM | POA: Diagnosis not present

## 2023-08-31 DIAGNOSIS — Z79899 Other long term (current) drug therapy: Secondary | ICD-10-CM | POA: Diagnosis not present

## 2023-08-31 DIAGNOSIS — Z79891 Long term (current) use of opiate analgesic: Secondary | ICD-10-CM | POA: Diagnosis not present

## 2023-08-31 DIAGNOSIS — Z923 Personal history of irradiation: Secondary | ICD-10-CM | POA: Diagnosis not present

## 2023-08-31 DIAGNOSIS — G893 Neoplasm related pain (acute) (chronic): Secondary | ICD-10-CM | POA: Diagnosis not present

## 2023-08-31 DIAGNOSIS — Z7982 Long term (current) use of aspirin: Secondary | ICD-10-CM | POA: Diagnosis not present

## 2023-08-31 DIAGNOSIS — R1310 Dysphagia, unspecified: Secondary | ICD-10-CM | POA: Diagnosis not present

## 2023-08-31 DIAGNOSIS — Z5111 Encounter for antineoplastic chemotherapy: Secondary | ICD-10-CM | POA: Diagnosis not present

## 2023-08-31 DIAGNOSIS — Z95828 Presence of other vascular implants and grafts: Secondary | ICD-10-CM | POA: Diagnosis not present

## 2023-08-31 LAB — RAD ONC ARIA SESSION SUMMARY
Course Elapsed Days: 50
Plan Fractions Treated to Date: 32
Plan Prescribed Dose Per Fraction: 2 Gy
Plan Total Fractions Prescribed: 35
Plan Total Prescribed Dose: 70 Gy
Reference Point Dosage Given to Date: 64 Gy
Reference Point Session Dosage Given: 2 Gy
Session Number: 32

## 2023-08-31 MED ORDER — NYSTATIN 100000 UNIT/ML MT SUSP
5.0000 mL | Freq: Three times a day (TID) | OROMUCOSAL | 1 refills | Status: DC
  Filled 2023-08-31 – 2023-10-13 (×2): qty 150, 10d supply, fill #0
  Filled 2023-10-23: qty 150, 10d supply, fill #1

## 2023-09-01 ENCOUNTER — Other Ambulatory Visit: Payer: Self-pay | Admitting: Oncology

## 2023-09-01 ENCOUNTER — Other Ambulatory Visit: Payer: Self-pay

## 2023-09-01 ENCOUNTER — Ambulatory Visit
Admission: RE | Admit: 2023-09-01 | Discharge: 2023-09-01 | Discharge status: Home / Self Care | Source: Ambulatory Visit | Attending: Radiation Oncology

## 2023-09-01 ENCOUNTER — Inpatient Hospital Stay

## 2023-09-01 VITALS — BP 130/50 | HR 96 | Temp 98.0°F | Resp 16

## 2023-09-01 DIAGNOSIS — Z923 Personal history of irradiation: Secondary | ICD-10-CM | POA: Diagnosis not present

## 2023-09-01 DIAGNOSIS — M546 Pain in thoracic spine: Secondary | ICD-10-CM | POA: Diagnosis not present

## 2023-09-01 DIAGNOSIS — Z510 Encounter for antineoplastic radiation therapy: Secondary | ICD-10-CM | POA: Diagnosis not present

## 2023-09-01 DIAGNOSIS — Z931 Gastrostomy status: Secondary | ICD-10-CM | POA: Diagnosis not present

## 2023-09-01 DIAGNOSIS — Z79891 Long term (current) use of opiate analgesic: Secondary | ICD-10-CM | POA: Diagnosis not present

## 2023-09-01 DIAGNOSIS — Z87891 Personal history of nicotine dependence: Secondary | ICD-10-CM | POA: Diagnosis not present

## 2023-09-01 DIAGNOSIS — Z79899 Other long term (current) drug therapy: Secondary | ICD-10-CM | POA: Diagnosis not present

## 2023-09-01 DIAGNOSIS — C090 Malignant neoplasm of tonsillar fossa: Secondary | ICD-10-CM | POA: Diagnosis not present

## 2023-09-01 DIAGNOSIS — Z5111 Encounter for antineoplastic chemotherapy: Secondary | ICD-10-CM | POA: Diagnosis not present

## 2023-09-01 DIAGNOSIS — G893 Neoplasm related pain (acute) (chronic): Secondary | ICD-10-CM | POA: Diagnosis not present

## 2023-09-01 DIAGNOSIS — Z95828 Presence of other vascular implants and grafts: Secondary | ICD-10-CM | POA: Diagnosis not present

## 2023-09-01 DIAGNOSIS — Z7982 Long term (current) use of aspirin: Secondary | ICD-10-CM | POA: Diagnosis not present

## 2023-09-01 DIAGNOSIS — R1310 Dysphagia, unspecified: Secondary | ICD-10-CM | POA: Diagnosis not present

## 2023-09-01 LAB — RAD ONC ARIA SESSION SUMMARY
Course Elapsed Days: 51
Plan Fractions Treated to Date: 33
Plan Prescribed Dose Per Fraction: 2 Gy
Plan Total Fractions Prescribed: 35
Plan Total Prescribed Dose: 70 Gy
Reference Point Dosage Given to Date: 66 Gy
Reference Point Session Dosage Given: 2 Gy
Session Number: 33

## 2023-09-01 MED ORDER — MORPHINE SULFATE (PF) 4 MG/ML IV SOLN
4.0000 mg | INTRAVENOUS | Status: DC | PRN
  Administered 2023-09-01: 4 mg via INTRAVENOUS
  Filled 2023-09-01: qty 1

## 2023-09-01 MED ORDER — ONDANSETRON HCL 4 MG/2ML IJ SOLN
8.0000 mg | Freq: Once | INTRAMUSCULAR | Status: AC
  Administered 2023-09-01: 8 mg via INTRAVENOUS
  Filled 2023-09-01: qty 4

## 2023-09-01 MED ORDER — FAMOTIDINE IN NACL 20-0.9 MG/50ML-% IV SOLN
20.0000 mg | Freq: Once | INTRAVENOUS | Status: AC
  Administered 2023-09-01: 20 mg via INTRAVENOUS
  Filled 2023-09-01: qty 50

## 2023-09-01 MED ORDER — SODIUM CHLORIDE 0.9 % IV SOLN: Freq: Once | INTRAVENOUS | Status: AC

## 2023-09-01 NOTE — Patient Instructions (Signed)

## 2023-09-02 ENCOUNTER — Ambulatory Visit
Admission: RE | Admit: 2023-09-02 | Discharge: 2023-09-02 | Disposition: A | Discharge status: Home / Self Care | Source: Ambulatory Visit | Attending: Radiation Oncology

## 2023-09-02 ENCOUNTER — Ambulatory Visit: Attending: Radiation Oncology

## 2023-09-02 ENCOUNTER — Other Ambulatory Visit: Payer: Self-pay

## 2023-09-02 ENCOUNTER — Other Ambulatory Visit (HOSPITAL_COMMUNITY): Payer: Self-pay

## 2023-09-02 DIAGNOSIS — Z87891 Personal history of nicotine dependence: Secondary | ICD-10-CM | POA: Diagnosis not present

## 2023-09-02 DIAGNOSIS — R1310 Dysphagia, unspecified: Secondary | ICD-10-CM | POA: Diagnosis not present

## 2023-09-02 DIAGNOSIS — Z510 Encounter for antineoplastic radiation therapy: Secondary | ICD-10-CM | POA: Diagnosis not present

## 2023-09-02 DIAGNOSIS — Z5111 Encounter for antineoplastic chemotherapy: Secondary | ICD-10-CM | POA: Diagnosis not present

## 2023-09-02 DIAGNOSIS — M546 Pain in thoracic spine: Secondary | ICD-10-CM | POA: Diagnosis not present

## 2023-09-02 DIAGNOSIS — Z7982 Long term (current) use of aspirin: Secondary | ICD-10-CM | POA: Diagnosis not present

## 2023-09-02 DIAGNOSIS — Z931 Gastrostomy status: Secondary | ICD-10-CM | POA: Diagnosis not present

## 2023-09-02 DIAGNOSIS — G893 Neoplasm related pain (acute) (chronic): Secondary | ICD-10-CM | POA: Diagnosis not present

## 2023-09-02 DIAGNOSIS — Z79899 Other long term (current) drug therapy: Secondary | ICD-10-CM | POA: Diagnosis not present

## 2023-09-02 DIAGNOSIS — C090 Malignant neoplasm of tonsillar fossa: Secondary | ICD-10-CM | POA: Diagnosis not present

## 2023-09-02 DIAGNOSIS — Z79891 Long term (current) use of opiate analgesic: Secondary | ICD-10-CM | POA: Diagnosis not present

## 2023-09-02 DIAGNOSIS — Z95828 Presence of other vascular implants and grafts: Secondary | ICD-10-CM | POA: Diagnosis not present

## 2023-09-02 DIAGNOSIS — Z923 Personal history of irradiation: Secondary | ICD-10-CM | POA: Diagnosis not present

## 2023-09-02 LAB — RAD ONC ARIA SESSION SUMMARY
Course Elapsed Days: 52
Plan Fractions Treated to Date: 34
Plan Prescribed Dose Per Fraction: 2 Gy
Plan Total Fractions Prescribed: 35
Plan Total Prescribed Dose: 70 Gy
Reference Point Dosage Given to Date: 68 Gy
Reference Point Session Dosage Given: 2 Gy
Session Number: 34

## 2023-09-02 NOTE — Therapy (Signed)
 OUTPATIENT SPEECH LANGUAGE PATHOLOGY ONCOLOGY TREATMENT   Patient Name: Isaac Carpenter MRN: [id number] DOB:[birth year], 73 y.o., male Today's Date: 09/02/2023  PCP: [name], Betty, MD REFERRING PROVIDER: Colie Dawes, MD  END OF SESSION:  End of Session - 09/02/23 0933     Visit Number 2    Number of Visits 7    Date for SLP Re-Evaluation 10/20/23    SLP Start Time 0850    SLP Stop Time  0920    SLP Time Calculation (min) 30 min    Activity Tolerance Patient tolerated treatment well              Past Medical History:  Diagnosis Date   Arthritis    Cancer (HCC)    History of partial thyroidectomy STATES OVERACTIVE THYROID -- NO ISSUES SINCE AGE 77 AND NO MEDS   Hyperlipidemia    Hypertension    Hypothyroidism    Sleep apnea    Per patient he tried CPAP but could not tolerate.   Thyroid  disease    Past Surgical History:  Procedure Laterality Date   CIRCUMCISION  01/04/2012   Procedure: CIRCUMCISION ADULT;  Surgeon: Isaac Gouge, MD;  Location: [facility];  Service: Urology;  Laterality: N/A;   COLONOSCOPY  04/08/2020   Isaac Carpenter   CYSTOSCOPY  09/04/2022   Procedure: CYSTOSCOPY;  Surgeon: Isaac Frizzle, MD;  Location: [facility];  Service: Urology;;   IR GASTROSTOMY TUBE MOD SED  06/18/2023   IR IMAGING GUIDED PORT INSERTION  06/18/2023   RADIOACTIVE SEED IMPLANT N/A 09/04/2022   Procedure: RADIOACTIVE SEED IMPLANT/BRACHYTHERAPY IMPLANT;  Surgeon: Isaac Frizzle, MD;  Location: [facility];  Service: Urology;  Laterality: N/A;  90 MINS   SPACE OAR INSTILLATION N/A 09/04/2022   Procedure: SPACE OAR INSTILLATION;  Surgeon: Isaac Frizzle, MD;  Location: [facility];  Service: Urology;  Laterality: N/A;   THYROIDECTOMY, PARTIAL  AGE 77   OVERACTIVE THYROID    Patient Active Problem List   Diagnosis Date Noted   Cancer (HCC) 08/14/2023   Febrile neutropenia (HCC) 08/14/2023   S/P  percutaneous endoscopic gastrostomy (PEG) tube placement (HCC) 08/14/2023   Hyponatremia 08/14/2023   Normocytic anemia 08/14/2023   Chemotherapy induced neutropenia (HCC) 07/30/2023   Fatigue 07/16/2023   Situational anxiety 07/09/2023   Encounter for antineoplastic chemotherapy 07/09/2023   Dysphagia 07/09/2023   Port-A-Cath in place 07/05/2023   Sensorineural hearing loss (SNHL) of right ear 06/23/2023   Cancer associated pain 06/11/2023   Coordination of complex care 06/11/2023   Malignant neoplasm of tonsillar fossa (HCC) 06/08/2023   Malignant neoplasm of prostate (HCC) 07/17/2022   Hyperlipidemia 05/11/2021   Morbid obesity (HCC)-BMI 37.8 with DM II,HLD,OSA,HTN 05/09/2021   Hypertension, essential, benign 07/23/2020   OSA (obstructive sleep apnea) 07/23/2020   Type 2 diabetes mellitus with other specified complication (HCC) 01/31/2020   BPH associated with nocturia 01/31/2020   Hypothyroidism, postsurgical 01/31/2020    ONSET DATE: see "pertinent history"   REFERRING DIAG: Malignant Neoplasm of tonsillar fossa  THERAPY DIAG:  Dysphagia, unspecified type  Rationale for Evaluation and Treatment: Rehabilitation  SUBJECTIVE:   SUBJECTIVE STATEMENT: "Too sore."  Pt accompanied by: significant other Isaac Carpenter  PERTINENT HISTORY:  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 He presented to his PCP on 05/17/23 with complaints of right sided jaw/maxillary pain when coughing. 05/17/23 He underwent a CT neck which revealed an ulcerated enhancing mass involving  the superior oropharynx and soft palate/uvula measuring 3.4 x 3.0 x 3.6 cm in the greatest extent that is concerning for malignancy. No evidence of cervical lymphadenopathy was indicated on scan. 05/27/23 He saw Dr. Soldatova. She completed a laryngoscopy with biopsies which revealed Invasive moderately differentiated SCC, p 16 +. 06/04/23 visit with Dr. Soldatova for worsening  symptoms of odynophagia and dysphagia. He was unable to maintain adequate nutrition. 06/07/23 PET revealing a right oropharyngeal tumor >4cm, likely b/l level II adenopathy, no distant metastases. Consult with Dr. Lurena [facility] 06/08/23, patient reported extreme pain when swallowing that prevents him from eating or swallowing, PEG tube scheduled. 2/20 consult with Dr. Randye [facility]. He will receive chemotherapy/radiation. Treatment plan:  He will receive 35 fractions of radiation to his right Tonsil and bilateral neck with weekly chemotherapy which started on 07/12/23 and will complete 08/27/23. Pretreatment procedures: PEG/PAC 06/18/23. MBSS 06/28/23. When he presented for consult he had severe dysphagia and PEG was placed quickly. He did recover from the dysphagia (patient thinks it was related to biopsy pain).  PAIN:  Are you having pain? Yes: NPRS scale: 9-10/10 Pain location: Throat Pain description: sore Aggravating factors: swallowing Relieving factors: lidocaine    FALLS: Has patient fallen in last 6 months?  No  PATIENT GOALS: Maintain WNL swallowing  OBJECTIVE:  Note: Objective measures were completed at Evaluation unless otherwise noted. DIAGNOSTIC FINDINGS: See "Pertinent history" above  INSTRUMENTAL SWALLOW STUDY FINDINGS (MBSS) 06/28/23 Clinical Impression: Patient presents with an oropharyngeal swallow that is largely Isaac Carpenter. No penetration or aspiration occured during any phase of the swallow with any of the tested liquid or solid consistencies. Anterior hyoid excursion appeared partial in completion but patient with full epiglottic inversion and laryngeal vestibule closure. With solids, he did exhibit increased amount of vallecular residuals but with clearance with subsequent swallows and sips of liquids. PES opening appeared [facility] and no retrograde flow of barium observed in upper esophagus. Trace amount of barium residue remained in oropharynx at level of known mass, however barium fully cleared with  subsequent swallows. Barium tablet transit appeared to stall in distal esophagus.    SLP recommends referral to OP SLP secondary to planned oropharyngeal chemoradiation treatment.   Recommendations/Plan: Swallowing Evaluation Recommendations PO Diet Recommendation: Regular; Thin liquids (Level 0) Liquid Administration via: Cup; Straw Medication Administration: Whole meds with liquid Supervision: Patient able to self-feed Swallowing strategies  : Follow solids with liquids Postural changes: Position pt fully upright for meals Oral care recommendations: Oral care BID (2x/day)                                                                                                                            TREATMENT DATE:   09/02/23: Pt c/o pain in throat 10/10. Is not doing any reps of HEP currently, and has not done any since last ST session. Pt told SLP rationale of HEP with independence. Pt took 1/2-3/4 teaspoons water  today without s/sx oral, or overt s/sx pharyngeal deficits.  SLP encouraged pt to have at least 2-3 teaspoons of water  at least 2-3 times/day and increase this as pt is able, up to small sips of water  multiple times per day. SLP reminded pt about muscle disuse atrophy and that this was possible due to the time pt has not had anything PO. SLP educated pt and SO about food journal.  SLP demonstrated each exercise of HEP (Except Shaker) with pt and SO, and described a few examples of how pt could add HEP back into his daily routine, with goal being at least 20 reps of effortful, Masako, and Mendelsohn per day. SLP reiterated pt could perform multiple reps of Shaker without the need to swallow and encouraged pt to perform this exercise 9-12 times/day starting today.  07/22/23: Research states the risk for dysphagia increases due to radiation and/or chemotherapy treatment due to a variety of factors, so SLP educated the pt about the possibility of reduced/limited ability for PO intake during rad tx.  SLP also educated pt regarding possible changes to swallowing musculature after rad tx, and why adherence to dysphagia HEP provided today and PO consumption was necessary to inhibit muscle fibrosis following rad tx and to mitigate muscle disuse atrophy. SLP informed pt why this would be detrimental to their swallowing status and to their pulmonary health. Pt demonstrated understanding of these things to SLP. SLP encouraged pt to safely eat and drink as deep into their radiation/chemotherapy as possible to provide the best possible long-term swallowing outcome for pt.  SLP then developed an individualized HEP for pt involving oral and pharyngeal strengthening and ROM and pt was instructed how to perform these exercises, including SLP demonstration. After SLP demonstration, pt return demonstrated each exercise. SLP ensured pt performance was correct prior to educating pt on next exercise. Pt required min-mod cues faded to modified independent to perform HEP. Pt was instructed to complete this program 5-7 days/week, at least 20 reps a day until 6 months after his last day of rad tx, and then x2/week after that, indefinitely. Among other modifications for days when pt cannot functionally swallow, SLP also suggested pt to perform only non-swallowing tasks on the handout/HEP, and if necessary to cycle through the swallowing portion so the full program of exercises can be completed instead of fatiguing on one of the swallowing exercises and being unable to perform the other swallowing exercises. SLP instructed that swallowing exercises should then be added back into the regimen as pt is able to do so.   PATIENT EDUCATION: Education details: late effects head/neck radiation on swallow function, HEP procedure, and modification to HEP when difficulty experienced with swallowing during and after radiation course Person educated: Patient and Caregiver Isaac Carpenter Education method: Explanation, Demonstration, Verbal cues, and  Handouts Education comprehension: verbalized understanding, returned demonstration, verbal cues required, and needs further education   ASSESSMENT:  CLINICAL IMPRESSION: Patient is a 73 y.o. M who was seen today for treatment of swallowing as they undergo radiation/chemoradiation therapy. Today pt drank teaspoons of thin liquids without overt s/s oral or pharyngeal difficulty. At this time pt swallowing is deemed WNL/WFL with these POs. There are no overt s/s aspiration PNA observed by SLP nor any reported by pt at this time. Data indicate that pt's swallow ability will likely decrease over the course of radiation/chemoradiation therapy and could very well decline over time following the conclusion of that therapy due to muscle disuse atrophy and/or muscle fibrosis. Pt will cont to need to be seen by SLP in order  to assess safety of PO intake, assess the need for recommending any objective swallow assessment, and ensuring pt is correctly completing the individualized HEP.  OBJECTIVE IMPAIRMENTS: include dysphagia. These impairments are limiting patient from safety when swallowing. Factors affecting potential to achieve goals and functional outcome are none noted today. Patient will benefit from skilled SLP services to address above impairments and improve overall function.  REHAB POTENTIAL: Good   GOALS: Goals reviewed with patient? No  SHORT TERM GOALS: Target: 3rd total session   1. Pt will compelte HEP with modified independence in 2 sessions Baseline: Goal status: INITIAL   2.  pt will tell SLP why pt is completing HEP with modified independence Baseline:  Goal status: Met   3.  pt will describe 3 overt s/s aspiration PNA with modified independence Baseline:  Goal status: INITIAL   4.  pt will tell SLP how a food journal could hasten return to a more normalized diet Baseline:  Goal status: Met     LONG TERM GOALS: Target: 7th total session   1.  pt will complete HEP with  independence over two visits Baseline:  Goal status: INITIAL   2.  pt will describe how to modify HEP over time, and the timeline associated with reduction in HEP frequency with modified independence over two sessions Baseline:  Goal status: INITIAL     PLAN:   SLP FREQUENCY:  once approx every 4 weeks   SLP DURATION:  7 sessions   PLANNED INTERVENTIONS: Aspiration precaution training, Pharyngeal strengthening exercises, Diet toleration management , Trials of upgraded texture/liquids, SLP instruction and feedback, Compensatory strategies, and Patient/family education, [phone] (treatment of swallowing dysfunction and/or oral function for feeding)   Maeson Purohit, CCC-SLP 09/02/2023, 9:34 AM

## 2023-09-03 ENCOUNTER — Other Ambulatory Visit: Payer: Self-pay | Admitting: Oncology

## 2023-09-03 ENCOUNTER — Ambulatory Visit
Admission: RE | Admit: 2023-09-03 | Discharge: 2023-09-03 | Disposition: A | Discharge status: Home / Self Care | Source: Ambulatory Visit | Attending: Radiation Oncology | Admitting: Radiation Oncology

## 2023-09-03 ENCOUNTER — Other Ambulatory Visit (HOSPITAL_COMMUNITY): Payer: Self-pay

## 2023-09-03 ENCOUNTER — Other Ambulatory Visit: Payer: Self-pay

## 2023-09-03 ENCOUNTER — Inpatient Hospital Stay: Admitting: Dietician

## 2023-09-03 ENCOUNTER — Inpatient Hospital Stay

## 2023-09-03 ENCOUNTER — Telehealth: Payer: Self-pay | Admitting: Oncology

## 2023-09-03 VITALS — BP 132/91 | HR 103 | Temp 98.0°F | Resp 18 | Wt 257.8 lb

## 2023-09-03 DIAGNOSIS — Z923 Personal history of irradiation: Secondary | ICD-10-CM | POA: Diagnosis not present

## 2023-09-03 DIAGNOSIS — G4701 Insomnia due to medical condition: Secondary | ICD-10-CM

## 2023-09-03 DIAGNOSIS — R1310 Dysphagia, unspecified: Secondary | ICD-10-CM | POA: Diagnosis not present

## 2023-09-03 DIAGNOSIS — C090 Malignant neoplasm of tonsillar fossa: Secondary | ICD-10-CM

## 2023-09-03 DIAGNOSIS — G893 Neoplasm related pain (acute) (chronic): Secondary | ICD-10-CM

## 2023-09-03 DIAGNOSIS — Z79891 Long term (current) use of opiate analgesic: Secondary | ICD-10-CM | POA: Diagnosis not present

## 2023-09-03 DIAGNOSIS — Z931 Gastrostomy status: Secondary | ICD-10-CM | POA: Diagnosis not present

## 2023-09-03 DIAGNOSIS — Z510 Encounter for antineoplastic radiation therapy: Secondary | ICD-10-CM | POA: Diagnosis not present

## 2023-09-03 DIAGNOSIS — Z79899 Other long term (current) drug therapy: Secondary | ICD-10-CM | POA: Diagnosis not present

## 2023-09-03 DIAGNOSIS — Z87891 Personal history of nicotine dependence: Secondary | ICD-10-CM | POA: Diagnosis not present

## 2023-09-03 DIAGNOSIS — M546 Pain in thoracic spine: Secondary | ICD-10-CM | POA: Diagnosis not present

## 2023-09-03 DIAGNOSIS — Z7982 Long term (current) use of aspirin: Secondary | ICD-10-CM | POA: Diagnosis not present

## 2023-09-03 DIAGNOSIS — Z5111 Encounter for antineoplastic chemotherapy: Secondary | ICD-10-CM | POA: Diagnosis not present

## 2023-09-03 DIAGNOSIS — Z95828 Presence of other vascular implants and grafts: Secondary | ICD-10-CM | POA: Diagnosis not present

## 2023-09-03 LAB — RAD ONC ARIA SESSION SUMMARY
Course Elapsed Days: 53
Plan Fractions Treated to Date: 35
Plan Prescribed Dose Per Fraction: 2 Gy
Plan Total Fractions Prescribed: 35
Plan Total Prescribed Dose: 70 Gy
Reference Point Dosage Given to Date: 70 Gy
Reference Point Session Dosage Given: 2 Gy
Session Number: 35

## 2023-09-03 MED ORDER — FENTANYL 75 MCG/HR TD PT72
1.0000 | MEDICATED_PATCH | TRANSDERMAL | 0 refills | Status: AC
Start: 2023-09-03 — End: ?
  Filled 2023-09-03 – 2023-09-08 (×2): qty 10, 30d supply, fill #0

## 2023-09-03 MED ORDER — HEPARIN SOD (PORK) LOCK FLUSH 100 UNIT/ML IV SOLN
500.0000 [IU] | Freq: Once | INTRAVENOUS | Status: AC
  Administered 2023-09-03: 500 [IU]

## 2023-09-03 MED ORDER — SODIUM CHLORIDE 0.9% FLUSH
10.0000 mL | Freq: Once | INTRAVENOUS | Status: AC
  Administered 2023-09-03: 10 mL

## 2023-09-03 MED ORDER — TRAZODONE HCL 50 MG PO TABS
50.0000 mg | ORAL_TABLET | Freq: Every day | ORAL | 1 refills | Status: DC
  Filled 2023-09-03: qty 30, 30d supply, fill #0

## 2023-09-03 MED ORDER — SODIUM CHLORIDE 0.9 % IV SOLN: Freq: Once | INTRAVENOUS | Status: AC

## 2023-09-03 NOTE — Patient Instructions (Signed)

## 2023-09-03 NOTE — Telephone Encounter (Signed)
 Mike's voicemail box is full.

## 2023-09-03 NOTE — Progress Notes (Signed)
 Oncology Nurse Navigator Documentation   Met with Isaac Carpenter after final RT to offer support and to celebrate end of radiation treatment.   Provided verbal post-RT guidance: Importance of keeping all follow-up appts, especially those with Nutrition and SLP. Importance of protecting treatment area from sun. Continuation of Sonafine application 2-3 times daily, application of antibiotic ointment to areas of raw skin; when supply of Sonafine exhausted transition to OTC lotion with vitamin E.  Explained my role as navigator will continue for several more months, encouraged him to call me with needs/concerns.    Lynetta Saran RN, BSN, OCN Head & Neck Oncology Nurse Navigator [facility] Phone # [phone]  Fax # [phone]

## 2023-09-03 NOTE — Progress Notes (Signed)
 Nutrition Follow-up:  Pt with stage II SCC of right tonsil, p16 positive. He completed concurrent chemoradiation (first chemo 3/21; first RT 3/24). Patient is under the care of Dr. Lurena Sally and Dr. Randye Buttner.   S/p Gtube 2/28 DME: [employer]   Chemo stopped after cycle 6. Patient had one week break from radiation. Resumed treatment 5/5. Final RT 5/16. Patient receiving supportive therapy with IVF.   Met with patient and wife in infusion. Wife reports tube feedings have been going better. He is getting 6-7 cartons of Nutren 1.5 and tolerating well. She is supplementing with prosource 30 ml BID. Patient reports pain is poorly controlled with Hycet. Says he has not slept in the last 6 weeks. Wife reports xanax  has opposite effect on patient. His saliva is thick. Patient unable to get this out. Reports feeling like he is choking on this at times. Having intermittent nausea related to swallowing this. Patient is doing baking soda salt water  rinses. He has not tried sprite/ginger ale. Patient denies diarrhea or constipation.     Medications: reviewed   Labs: no new labs   Anthropometrics: Wt 257 lb 12.8 oz today - decreased   5/2 - 262 lb 4/21 - 266 lb 1.6 oz 4/11 - 269 lb 1.6 oz  3/27 - 273 lb 1.6 oz  3/20 - 282 lb   Estimated Energy Needs  Kcals: 2800-3180 Protein: 140-153 Fluid: >2.8 L  NUTRITION DIAGNOSIS: Unintended wt loss - continues    INTERVENTION:  Continue at least 6 cartons of Nutren 1.5 via tube Continue 30 ml prosource BID Discussed poorly controlled pain/sleep with Dr. Pasam - fentanyl  patch and trazodone called in (pt advised to allow at least 3 days before pain relief felt from patch)  Continue supportive therapy with IVF   MONITORING, EVALUATION, GOAL: wt trends, TF, intake   NEXT VISIT: Thursday May 22 during infusion

## 2023-09-06 ENCOUNTER — Inpatient Hospital Stay

## 2023-09-06 ENCOUNTER — Other Ambulatory Visit (HOSPITAL_COMMUNITY): Payer: Self-pay

## 2023-09-06 VITALS — BP 110/78 | HR 107 | Temp 99.3°F | Resp 16

## 2023-09-06 DIAGNOSIS — R1310 Dysphagia, unspecified: Secondary | ICD-10-CM | POA: Diagnosis not present

## 2023-09-06 DIAGNOSIS — Z95828 Presence of other vascular implants and grafts: Secondary | ICD-10-CM | POA: Diagnosis not present

## 2023-09-06 DIAGNOSIS — Z5111 Encounter for antineoplastic chemotherapy: Secondary | ICD-10-CM | POA: Diagnosis not present

## 2023-09-06 DIAGNOSIS — Z931 Gastrostomy status: Secondary | ICD-10-CM | POA: Diagnosis not present

## 2023-09-06 DIAGNOSIS — G893 Neoplasm related pain (acute) (chronic): Secondary | ICD-10-CM | POA: Diagnosis not present

## 2023-09-06 DIAGNOSIS — Z79899 Other long term (current) drug therapy: Secondary | ICD-10-CM | POA: Diagnosis not present

## 2023-09-06 DIAGNOSIS — M546 Pain in thoracic spine: Secondary | ICD-10-CM | POA: Diagnosis not present

## 2023-09-06 DIAGNOSIS — C090 Malignant neoplasm of tonsillar fossa: Secondary | ICD-10-CM

## 2023-09-06 DIAGNOSIS — Z510 Encounter for antineoplastic radiation therapy: Secondary | ICD-10-CM | POA: Diagnosis not present

## 2023-09-06 DIAGNOSIS — Z923 Personal history of irradiation: Secondary | ICD-10-CM | POA: Diagnosis not present

## 2023-09-06 DIAGNOSIS — Z79891 Long term (current) use of opiate analgesic: Secondary | ICD-10-CM | POA: Diagnosis not present

## 2023-09-06 DIAGNOSIS — Z87891 Personal history of nicotine dependence: Secondary | ICD-10-CM | POA: Diagnosis not present

## 2023-09-06 DIAGNOSIS — Z7982 Long term (current) use of aspirin: Secondary | ICD-10-CM | POA: Diagnosis not present

## 2023-09-06 MED ORDER — MORPHINE SULFATE (PF) 4 MG/ML IV SOLN
4.0000 mg | INTRAVENOUS | Status: DC | PRN
  Administered 2023-09-06: 4 mg via INTRAVENOUS
  Filled 2023-09-06: qty 1

## 2023-09-06 MED ORDER — SODIUM CHLORIDE 0.9 % IV SOLN: Freq: Once | INTRAVENOUS | Status: AC

## 2023-09-06 MED ORDER — FAMOTIDINE IN NACL 20-0.9 MG/50ML-% IV SOLN
20.0000 mg | Freq: Once | INTRAVENOUS | Status: AC
  Administered 2023-09-06: 20 mg via INTRAVENOUS
  Filled 2023-09-06: qty 50

## 2023-09-06 NOTE — Radiation Completion Notes (Signed)
 Patient Name: Isaac Carpenter, Isaac Carpenter MRN: [id number] Date of Birth: 1950/12/23 Referring Physician: Artice Last, M.D. Date of Service: 2023-09-06 Radiation Oncologist: Colie Dawes, M.D. [facility]                             RADIATION ONCOLOGY END OF TREATMENT NOTE     Diagnosis: C09.0 Malignant neoplasm of tonsillar fossa Staging on 2022-05-13: Malignant neoplasm of prostate (HCC) T=cT1c, N=cN0, M=cM0 Staging on 2023-06-08: Malignant neoplasm of tonsillar fossa (HCC) T=cT3, N=cN2, M=cM0 Intent: Curative     ==========DELIVERED PLANS==========  First Treatment Date: 2023-07-12 Last Treatment Date: 2023-09-03   Plan Name: HN_R_Tonsil Site: Tonsil, Right Technique: IMRT Mode: Photon Dose Per Fraction: 2 Gy Prescribed Dose (Delivered / Prescribed): 70 Gy / 70 Gy Prescribed Fxs (Delivered / Prescribed): 35 / 35     ==========ON TREATMENT VISIT DATES========== 2023-07-12, 2023-07-19, 2023-07-26, 2023-08-02, 2023-08-09, 2023-08-16, 2023-08-23, 2023-08-30     ==========UPCOMING VISITS========== 10/04/2023 [facility] NEURO ST TREATMENT Lilia Reges, CCC-SLP  09/21/2023 [facility]RAD[facility]ATION ONC FOLLOW UP 20 Cyrena Drilling  09/16/2023 [facility] RE-EVALUATION [place], Gas L, PT  09/09/2023 [facility]-MED ONCOLOGY NUT 45 Woody Heading, [place]  09/09/2023 [facility] ONCOLOGY INFUSION 3HR (180) [facility] INFUSION  09/06/2023 [facility]COLOGY INFUSION 2HR30MIN (150) [facility] INFUSION        ==========APPENDIX - ON TREATMENT VISIT NOTES==========   See weekly On Treatment Notes in Epic for details in the Media tab (listed as Progress notes on the On Treatment Visit Dates listed above).

## 2023-09-06 NOTE — Patient Instructions (Signed)

## 2023-09-07 ENCOUNTER — Other Ambulatory Visit: Payer: Self-pay | Admitting: Oncology

## 2023-09-07 ENCOUNTER — Other Ambulatory Visit (HOSPITAL_COMMUNITY): Payer: Self-pay

## 2023-09-08 ENCOUNTER — Other Ambulatory Visit: Payer: Self-pay | Admitting: Oncology

## 2023-09-08 ENCOUNTER — Encounter: Payer: Self-pay | Admitting: Radiation Oncology

## 2023-09-08 ENCOUNTER — Other Ambulatory Visit (HOSPITAL_COMMUNITY): Payer: Self-pay

## 2023-09-08 ENCOUNTER — Encounter: Payer: Self-pay | Admitting: Oncology

## 2023-09-09 ENCOUNTER — Inpatient Hospital Stay

## 2023-09-09 ENCOUNTER — Inpatient Hospital Stay: Admitting: Dietician

## 2023-09-09 ENCOUNTER — Other Ambulatory Visit: Payer: Self-pay | Admitting: Oncology

## 2023-09-09 ENCOUNTER — Other Ambulatory Visit (HOSPITAL_COMMUNITY): Payer: Self-pay

## 2023-09-09 VITALS — BP 125/64 | HR 96 | Temp 98.3°F | Resp 16

## 2023-09-09 DIAGNOSIS — M546 Pain in thoracic spine: Secondary | ICD-10-CM | POA: Diagnosis not present

## 2023-09-09 DIAGNOSIS — Z931 Gastrostomy status: Secondary | ICD-10-CM | POA: Diagnosis not present

## 2023-09-09 DIAGNOSIS — Z5111 Encounter for antineoplastic chemotherapy: Secondary | ICD-10-CM | POA: Diagnosis not present

## 2023-09-09 DIAGNOSIS — Z87891 Personal history of nicotine dependence: Secondary | ICD-10-CM | POA: Diagnosis not present

## 2023-09-09 DIAGNOSIS — Z79899 Other long term (current) drug therapy: Secondary | ICD-10-CM | POA: Diagnosis not present

## 2023-09-09 DIAGNOSIS — Z923 Personal history of irradiation: Secondary | ICD-10-CM | POA: Diagnosis not present

## 2023-09-09 DIAGNOSIS — C090 Malignant neoplasm of tonsillar fossa: Secondary | ICD-10-CM

## 2023-09-09 DIAGNOSIS — Z79891 Long term (current) use of opiate analgesic: Secondary | ICD-10-CM | POA: Diagnosis not present

## 2023-09-09 DIAGNOSIS — Z7982 Long term (current) use of aspirin: Secondary | ICD-10-CM | POA: Diagnosis not present

## 2023-09-09 DIAGNOSIS — Z95828 Presence of other vascular implants and grafts: Secondary | ICD-10-CM | POA: Diagnosis not present

## 2023-09-09 DIAGNOSIS — Z510 Encounter for antineoplastic radiation therapy: Secondary | ICD-10-CM | POA: Diagnosis not present

## 2023-09-09 DIAGNOSIS — R1310 Dysphagia, unspecified: Secondary | ICD-10-CM | POA: Diagnosis not present

## 2023-09-09 DIAGNOSIS — G893 Neoplasm related pain (acute) (chronic): Secondary | ICD-10-CM | POA: Diagnosis not present

## 2023-09-09 MED ORDER — SODIUM CHLORIDE 0.9 % IV SOLN: Freq: Once | INTRAVENOUS | Status: AC

## 2023-09-09 NOTE — Progress Notes (Signed)
 Nutrition Follow-up:   Pt with stage II SCC of right tonsil, p16 positive. He completed concurrent chemoradiation (first chemo 3/21; first RT 3/24). Patient is under the care of Dr. Lurena Sally and Dr. Randye Buttner.  He completed his radiation on 09/03/23.  He continues with IVF today.   S/p Gtube 2/28 DME: [employer]    Met with patient and wife in infusion. Wife reports he is getting 6 cartons of Nutren 1.5 and tolerating well, she gives 2 carton at a time and continues with prosource 30 ml BID.  She "will try." To attempt additional feed. Patient reports pain is 12 on a scale of 1-10.  Still not sleeping well, he reports between pain and secretions he's up all night.  MMW now helping, continues with baking soda swishes, also tried the gingerale and diet coke but "burned so bad." Wife reports he resisted use of Fentanyl  patch but she is going to pick up today and trial that for pain relief.  Bowels are regular with use of Mira lax  Estimates 24 oz./day free water  flushes with meds in addtion to flushes with feeds..   Medications: reviewed    Labs: no new labs    Anthropometrics: patient reports not weighed today 09/03/23  257lb. 12.8 oz  5/2 - 262 lb 4/21 - 266 lb 1.6 oz 4/11 - 269 lb 1.6 oz  3/27 - 273 lb 1.6 oz  3/20 - 282 lb    Estimated Energy Needs   Kcals: 2800-3180 Protein: 140-153 Fluid: >2.8 L   NUTRITION DIAGNOSIS: Unintended wt loss - continues      INTERVENTION:  Encouraged attempt at additional for feed for total of 7-8 cartons of Nutren 1.5 via tube/day Continue 30 ml prosource BID Encouraged trial of fentanyl  patch for pain control Encouraged weighing at home Continue supportive therapy with IVF   MONITORING, EVALUATION, GOAL: wt trends, TF, intake     NEXT VISIT: Remote next week

## 2023-09-10 ENCOUNTER — Other Ambulatory Visit: Payer: Self-pay

## 2023-09-10 ENCOUNTER — Other Ambulatory Visit: Payer: Self-pay | Admitting: Physician Assistant

## 2023-09-10 ENCOUNTER — Other Ambulatory Visit (HOSPITAL_COMMUNITY): Payer: Self-pay

## 2023-09-10 MED ORDER — METOCLOPRAMIDE HCL 10 MG/10ML PO SOLN
10.0000 mg | Freq: Three times a day (TID) | ORAL | 0 refills | Status: DC
  Filled 2023-09-10: qty 900, 30d supply, fill #0

## 2023-09-14 ENCOUNTER — Other Ambulatory Visit (HOSPITAL_COMMUNITY): Payer: Self-pay

## 2023-09-14 DIAGNOSIS — R1310 Dysphagia, unspecified: Secondary | ICD-10-CM | POA: Diagnosis not present

## 2023-09-14 DIAGNOSIS — C090 Malignant neoplasm of tonsillar fossa: Secondary | ICD-10-CM | POA: Diagnosis not present

## 2023-09-14 MED ORDER — ESOMEPRAZOLE MAGNESIUM 40 MG PO CPDR
40.0000 mg | DELAYED_RELEASE_CAPSULE | Freq: Every day | ORAL | 0 refills | Status: DC
  Filled 2023-09-14: qty 30, 30d supply, fill #0

## 2023-09-15 DIAGNOSIS — T85520A Displacement of bile duct prosthesis, initial encounter: Secondary | ICD-10-CM | POA: Diagnosis not present

## 2023-09-15 DIAGNOSIS — K9429 Other complications of gastrostomy: Secondary | ICD-10-CM | POA: Diagnosis not present

## 2023-09-15 DIAGNOSIS — Z4659 Encounter for fitting and adjustment of other gastrointestinal appliance and device: Secondary | ICD-10-CM | POA: Diagnosis not present

## 2023-09-15 DIAGNOSIS — K9423 Gastrostomy malfunction: Secondary | ICD-10-CM | POA: Diagnosis not present

## 2023-09-15 DIAGNOSIS — R000 Tachycardia, unspecified: Secondary | ICD-10-CM | POA: Diagnosis not present

## 2023-09-16 ENCOUNTER — Inpatient Hospital Stay

## 2023-09-16 ENCOUNTER — Other Ambulatory Visit (HOSPITAL_COMMUNITY): Payer: Self-pay

## 2023-09-16 ENCOUNTER — Ambulatory Visit: Payer: Self-pay | Admitting: Physical Therapy

## 2023-09-16 ENCOUNTER — Other Ambulatory Visit: Payer: Self-pay | Admitting: Oncology

## 2023-09-16 VITALS — BP 125/69 | HR 94 | Temp 98.5°F | Resp 20 | Wt 258.0 lb

## 2023-09-16 DIAGNOSIS — Z79891 Long term (current) use of opiate analgesic: Secondary | ICD-10-CM | POA: Diagnosis not present

## 2023-09-16 DIAGNOSIS — M546 Pain in thoracic spine: Secondary | ICD-10-CM | POA: Diagnosis not present

## 2023-09-16 DIAGNOSIS — C090 Malignant neoplasm of tonsillar fossa: Secondary | ICD-10-CM | POA: Diagnosis not present

## 2023-09-16 DIAGNOSIS — Z5111 Encounter for antineoplastic chemotherapy: Secondary | ICD-10-CM | POA: Diagnosis not present

## 2023-09-16 DIAGNOSIS — G893 Neoplasm related pain (acute) (chronic): Secondary | ICD-10-CM | POA: Diagnosis not present

## 2023-09-16 DIAGNOSIS — Z87891 Personal history of nicotine dependence: Secondary | ICD-10-CM | POA: Diagnosis not present

## 2023-09-16 DIAGNOSIS — Z7982 Long term (current) use of aspirin: Secondary | ICD-10-CM | POA: Diagnosis not present

## 2023-09-16 DIAGNOSIS — Z79899 Other long term (current) drug therapy: Secondary | ICD-10-CM | POA: Diagnosis not present

## 2023-09-16 DIAGNOSIS — Z923 Personal history of irradiation: Secondary | ICD-10-CM | POA: Diagnosis not present

## 2023-09-16 DIAGNOSIS — Z931 Gastrostomy status: Secondary | ICD-10-CM | POA: Diagnosis not present

## 2023-09-16 DIAGNOSIS — R1310 Dysphagia, unspecified: Secondary | ICD-10-CM | POA: Diagnosis not present

## 2023-09-16 DIAGNOSIS — N401 Enlarged prostate with lower urinary tract symptoms: Secondary | ICD-10-CM

## 2023-09-16 DIAGNOSIS — Z95828 Presence of other vascular implants and grafts: Secondary | ICD-10-CM | POA: Diagnosis not present

## 2023-09-16 DIAGNOSIS — Z510 Encounter for antineoplastic radiation therapy: Secondary | ICD-10-CM | POA: Diagnosis not present

## 2023-09-16 MED ORDER — HEPARIN SOD (PORK) LOCK FLUSH 100 UNIT/ML IV SOLN
500.0000 [IU] | Freq: Once | INTRAVENOUS | Status: AC
  Administered 2023-09-16: 500 [IU]

## 2023-09-16 MED ORDER — SODIUM CHLORIDE 0.9% FLUSH
10.0000 mL | Freq: Once | INTRAVENOUS | Status: AC
  Administered 2023-09-16: 10 mL

## 2023-09-16 MED ORDER — SODIUM CHLORIDE 0.9 % IV SOLN: Freq: Once | INTRAVENOUS | Status: AC

## 2023-09-16 MED ORDER — FAMOTIDINE IN NACL 20-0.9 MG/50ML-% IV SOLN
20.0000 mg | Freq: Once | INTRAVENOUS | Status: AC
  Administered 2023-09-16: 20 mg via INTRAVENOUS
  Filled 2023-09-16: qty 50

## 2023-09-16 MED ORDER — ONDANSETRON HCL 4 MG/2ML IJ SOLN
8.0000 mg | Freq: Once | INTRAMUSCULAR | Status: AC
  Administered 2023-09-16: 8 mg via INTRAVENOUS
  Filled 2023-09-16: qty 4

## 2023-09-16 MED ORDER — MORPHINE SULFATE (PF) 4 MG/ML IV SOLN: 4.0000 mg | INTRAVENOUS | Status: DC | PRN

## 2023-09-17 ENCOUNTER — Other Ambulatory Visit (HOSPITAL_COMMUNITY): Payer: Self-pay

## 2023-09-17 ENCOUNTER — Telehealth: Payer: Self-pay | Admitting: Dietician

## 2023-09-17 ENCOUNTER — Inpatient Hospital Stay: Admitting: Dietician

## 2023-09-17 ENCOUNTER — Telehealth: Payer: Self-pay | Admitting: Oncology

## 2023-09-17 NOTE — Telephone Encounter (Signed)
 Nutrition Follow-up:  Pt with stage II SCC of right tonsil, p16 positive. He completed concurrent chemoradiation (first chemo 3/21; final RT 5/16). Patient is under the care of Dr. Lurena Sally and Dr. Randye Buttner.   S/p Gtube 2/28 - replaced at [facility] with non Enfit FT 5/28 DME: Willene Harper with wife of pt via telephone. Patient continues to have persistent oral pain, however wife feels this is slowly improving. Fentanyl  patch was too strong and knocked him out. She will try cutting patch in half. He did try a spoonful of grits last week which went ok. Patient tried some gravy last night. This was not tolerated. Wife is going to make homemade vanilla pudding today.   Patient is relying on tube. Reports tube fell out at home while patient was sleeping on Wednesday. This was replaced with a non Enfit tube at [facility] ED. Says new tube is functioning well after obtaining correct syringes. Reports formula is flowing much better than the old tube. Patient is currently receiving 6 cartons of Nutren 1.5. Tolerating this well. Planning to try 7-8 cartons today. He continues to receive prosource 30 ml BID.   Wife reports pt does not have future IVF appointments scheduled. These have been beneficial to patient and would like to continue supportive care 2x/week for now.    Medications: reviewed   Labs: no new labs for review   Anthropometrics: Last wt 258 lb   5/16 - 257 lb 12.8 oz 5/9 - 262 lb 3.2 oz  4/26 - 264 lb 8.8 oz    Estimated Energy Needs  Kcals: 2800-3180 Protein: 140-153 Fluid: >2.8 L  NUTRITION DIAGNOSIS: Unintended wt loss - stable    INTERVENTION:  Continue at least 6 cartons of Nutren 1.5 via tube - will try increasing to goal as tolerated Continue 30 ml prosource BID Encourage daily po trials Message to Dr. Randye Buttner regarding additional IVF needs Support and encouragement    MONITORING, EVALUATION, GOAL: wt trends, intake, TF   NEXT VISIT: To be scheduled -  pending infusion appts

## 2023-09-20 NOTE — Progress Notes (Addendum)
 Radiation Oncology         [phone]) [phone] ________________________________  Name: DAYMIEN GOTH MRN: [id number]  Date: 09/21/2023  DOB: Nov 12, 1950  Follow-Up Visit Note  CC: [name], [name], MD  [name], [name], MD  Diagnosis and Prior Radiotherapy:       ICD-10-CM   1. Malignant neoplasm of tonsillar fossa (HCC)  C09.0         Cancer Staging  Malignant neoplasm of prostate [facility]) Staging form: Prostate, AJCC 8th Edition - Clinical stage from 05/13/2022: Stage IIC (cT1c, cN0, cM0, PSA: 13.2, Grade Group: 3) - Signed by Keitha Pata, PA-C on 07/17/2022 Histopathologic type: Adenocarcinoma, NOS Stage prefix: Initial diagnosis Prostate specific antigen (PSA) range: 10 to 19 Gleason primary pattern: 4 Gleason secondary pattern: 3 Gleason score: 7 Histologic grading system: 5 grade system Number of biopsy cores examined: 12 Number of biopsy cores positive: 4 Location of positive needle core biopsies: Both sides  Malignant neoplasm of tonsillar fossa (HCC) Staging form: Pharynx - HPV-Mediated Oropharynx, AJCC 8th Edition - Clinical stage from 06/08/2023: Stage II (cT3, cN2, cM0, p16+) - Signed by Colie Dawes, MD on 06/08/2023 Stage prefix: Initial diagnosis  ==========DELIVERED PLANS==========  First Treatment Date: 2023-07-12 Last Treatment Date: 2023-09-03   Plan Name: HN_R_Tonsil Site: Tonsil, Right Technique: IMRT Mode: Photon Dose Per Fraction: 2 Gy Prescribed Dose (Delivered / Prescribed): 70 Gy / 70 Gy Prescribed Fxs (Delivered / Prescribed): 35 / 35  Stage II (cT3, N2, 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 He completed his treatment on 09/03/2023.   Pain issues, if any: He reports 10/10  pain in throat. Using a feeding tube?: He reports that it is  flushing fine. 6 cartons/day.  Weight changes, if any: Has not been able to tolerate oral intake at this time.  Wt Readings from Last 3 Encounters:  09/21/23 256 lb 9.6 oz (116.4 kg)  09/16/23 258 lb (117 kg)  09/03/23 257 lb 12.8 oz (116.9 kg)  Swallowing issues, if any: Yes, he reports 10/10 pain when swallowing. Smoking or chewing tobacco? Denies Using fluoride toothpaste daily? He reports using fluoride free toothpaste. Last ENT visit was on: 05/27/23 Other notable issues, if any: None at this time                    ALLERGIES:  is allergic to other and testosterone.  Meds: Current Outpatient Medications  Medication Sig Dispense Refill   aspirin 81 MG chewable tablet Chew 81 mg by mouth daily.     doxazosin  (CARDURA ) 1 MG tablet TAKE ONE TABLET BY MOUTH AT BEDTIME 30 tablet 1   esomeprazole  (NEXIUM ) 40 MG capsule Take 1 capsule (40 mg total) as directed daily at 12 noon. See instructions to administer through Tube. 30 capsule 0   fentaNYL  (DURAGESIC ) 75 MCG/HR Place 1 patch onto the skin every 3 (three) days. 10 patch 0   HYDROcodone -acetaminophen  (HYCET) 7.5-325 mg/15 ml solution Take 10 mLs by mouth 4 (four) times daily as needed for moderate pain (pain score 4-6). 500 mL 0   levothyroxine  (SYNTHROID ) 88 MCG tablet TAKE ONE TABLET BY MOUTH DAILY. 90 tablet 2   lidocaine  (XYLOCAINE ) 2 % solution Patient: Mix 1part 2% viscous lidocaine , 1part water . Swish and spit 10mL of diluted mixture every 2 hours as needed. 300 mL  3   lidocaine -prilocaine  (EMLA ) cream Apply to affected area once 30 g 3   magic mouthwash (nystatin , diphenhydrAMINE, alum & mag hydroxide) suspension mixture Take 5 mLs by mouth 3 (three) times daily as directed. 150 mL 1   metoCLOPramide  (REGLAN ) 10 MG/10ML SOLN Place 10 mLs (10 mg total) into feeding tube 3 (three) times daily before meals. 900 mL 0   metoCLOPramide  (REGLAN ) 5 MG/5ML solution PLACE INTO FEEDING TUBE THREE TIMES DAILY BEFORE MEALS 900 mL 0    Nutritional Supplements (NUTREN 1.5) LIQD 2 cartons Nutren 1.5 (500 ml) QID via tube. Flush with 60 ml water  before/after each bolus. Provide additional 250 ml water  flush 4x/day in between feedings to meet hydration needs. Provides 3000 kcal, 136 g protein, 1528 ml free water  (3008 ml total water ) 2000 ml/day meets 100% DRI     ondansetron  (ZOFRAN -ODT) 8 MG disintegrating tablet Take 1 tablet (8 mg total) by mouth every 8 (eight) hours as needed for nausea or vomiting. 30 tablet 2   prochlorperazine  (COMPAZINE ) 10 MG tablet Take 1 tablet (10 mg total) by mouth every 6 (six) hours as needed (Nausea or vomiting). 30 tablet 1   solifenacin (VESICARE) 5 MG tablet Take 5 mg by mouth daily.     traZODone  (DESYREL ) 50 MG tablet Take 1 tablet (50 mg total) by mouth at bedtime. 30 tablet 1   ALPRAZolam  (XANAX ) 1 MG tablet Take 0.5 tablets (0.5 mg total) by mouth 2 (two) times daily as needed for anxiety. (Patient not taking: Reported on 09/21/2023) 60 tablet 0   dexamethasone  (DEXAMETHASONE  INTENSOL) 1 MG/ML solution TAKE BY MOUTH OR G-TUBE ONCE DAILY FOR THREE DAYS AFTER CHEMO (Patient not taking: Reported on 08/27/2023) 30 mL 0   fluconazole  (DIFLUCAN ) 40 MG/ML suspension Take 5mL today, then 2.5mL daily for 20 more days. HOLD ATORVASTATIN  WHILE ON THIS. (Patient not taking: Reported on 09/21/2023) 55 mL 0   No current facility-administered medications for this encounter.    Physical Findings:  Wt Readings from Last 3 Encounters:  09/21/23 256 lb 9.6 oz (116.4 kg)  09/16/23 258 lb (117 kg)  09/03/23 257 lb 12.8 oz (116.9 kg)    height is 6\' 1"  (1.854 m) and weight is 256 lb 9.6 oz (116.4 kg). His temperature is 97.8 F (36.6 C). His blood pressure is 129/72 and his pulse is 108 (abnormal). His respiration is 20 and oxygen saturation is 97%. .  General: Ill-appearing male, alert and oriented HEENT: Head is normocephalic. Extraocular movements are intact. Oropharynx is notable for thick mucous secretions  and radiation burns on the back right side of tongue and roof of mouth. No evidence of thrush.  Neck: Neck is notable for mild lymphedema, no palpable adenopathy Skin: Skin in treatment fields shows dry desquamation Heart: Tachycardic in rate and regular rhythm with no murmurs, rubs, or gallops. Chest: Clear to auscultation bilaterally, with no rhonchi, wheezes, or rales. Abdomen: Soft, nontender, nondistended, with no rigidity or guarding.  Extremities: No cyanosis or edema. Lymphatics: see Neck Exam Psychiatric: Judgment and insight are intact. Affect is appropriate.   Lab Findings: Lab Results  Component Value Date   WBC 3.3 (L) 08/27/2023   HGB 8.9 (L) 08/27/2023   HCT 26.8 (L) 08/27/2023   MCV 94.7 08/27/2023   PLT 333 08/27/2023    Lab Results  Component Value Date   TSH 2.872 06/14/2023    Radiographic Findings: No results found.  Impression/Plan:  Stage II (cT3, N2, M0) squamous cell  carcinoma of the tonsillar fossa, p16+; s/p concurrent chemoradiation completed on 09/03/2023  1) Head and Neck Cancer Status: Patient continues to experience the effects of his radiation treatment. Encouraged patient that people typically notice improvement in symptoms around the 2-weeks post-treatment.   Patient is not able to tolerate oral intake. His wife states he feels better after IV fluids. He is currently receiving fluids once/week. I have reached out to our staff to schedule him for fluids twice a week for the next 2-3 weeks.   2) Nutritional Status: Patient is not tolerating oral intake. Encouraged him to try this with liquids and soft foods as he starts to feel better.  Wt Readings from Last 3 Encounters:  09/21/23 256 lb 9.6 oz (116.4 kg)  09/16/23 258 lb (117 kg)  09/03/23 257 lb 12.8 oz (116.9 kg)   PEG tube: In-place, using 6 cartons/day  3) Risk Factors: The patient has been educated about risk factors including alcohol and tobacco abuse; they understand that avoidance  of alcohol and tobacco is important to prevent recurrences as well as other cancers  4) Swallowing: Patient is experiencing 10/10 odynophagia. Encouraged regular Hycet use (QID) to stay on-top of pain. Patient is scheduled to follow-up with SLP on 10/04/2023.   5) Dental: Encouraged to continue regular followup with dentistry, and dental hygiene including fluoride rinses.   6) Thyroid  function: Checking annually Lab Results  Component Value Date   TSH 2.872 06/14/2023    7) Other: Mucositis/xerostomia - encouraged saltwater/baking soda rinse as well as dry mouth remedies (regular sips of fluid, lozenges, biotene gel, etc.)  Lymphedema - patient missed PT follow-up due to hospitalization. I have reached out to Adrian today to get this rescheduled.   8) Routine follow-up in 2 weeks. PET in 2.5 months with office visit to follow. The patient was encouraged to call with any issues or questions before then.  On date of service, in total, I spent 35 minutes on this encounter. Patient was seen in person. _____________________________________    Julio Ohm, PA-C

## 2023-09-21 ENCOUNTER — Ambulatory Visit
Admission: RE | Admit: 2023-09-21 | Discharge: 2023-09-21 | Disposition: A | Discharge status: Home / Self Care | Source: Ambulatory Visit | Attending: Radiology | Admitting: Radiology

## 2023-09-21 ENCOUNTER — Other Ambulatory Visit: Payer: Self-pay

## 2023-09-21 ENCOUNTER — Encounter: Payer: Self-pay | Admitting: Radiology

## 2023-09-21 VITALS — BP 129/72 | HR 108 | Temp 97.8°F | Resp 20 | Ht 73.0 in | Wt 256.6 lb

## 2023-09-21 DIAGNOSIS — C090 Malignant neoplasm of tonsillar fossa: Secondary | ICD-10-CM | POA: Insufficient documentation

## 2023-09-21 DIAGNOSIS — Z79899 Other long term (current) drug therapy: Secondary | ICD-10-CM | POA: Diagnosis not present

## 2023-09-21 DIAGNOSIS — Z9221 Personal history of antineoplastic chemotherapy: Secondary | ICD-10-CM | POA: Insufficient documentation

## 2023-09-21 DIAGNOSIS — Z7982 Long term (current) use of aspirin: Secondary | ICD-10-CM | POA: Insufficient documentation

## 2023-09-21 DIAGNOSIS — Z7952 Long term (current) use of systemic steroids: Secondary | ICD-10-CM | POA: Diagnosis not present

## 2023-09-21 DIAGNOSIS — R5383 Other fatigue: Secondary | ICD-10-CM

## 2023-09-21 DIAGNOSIS — C61 Malignant neoplasm of prostate: Secondary | ICD-10-CM | POA: Insufficient documentation

## 2023-09-21 DIAGNOSIS — Z79890 Hormone replacement therapy (postmenopausal): Secondary | ICD-10-CM | POA: Insufficient documentation

## 2023-09-21 DIAGNOSIS — Z923 Personal history of irradiation: Secondary | ICD-10-CM | POA: Insufficient documentation

## 2023-09-21 NOTE — Progress Notes (Addendum)
 Patient identity verified x2  The patient returns today to see Julio Ohm PA-C for routine follow-up. He completed his treatment on 09/03/2023.    Pain issues, if any: He reports 10/10  pain in throat, however wife feels like he is improving. Using a feeding tube?: He reports that it is flushing fine. Reports feeding tube fell out at home while patient was sleeping. Reports tube was replaced and is functioning well. Weight changes, if any:  Wt Readings from Last 3 Encounters: 09/21/23         253.6 lb   09/16/23 258 lb (117 kg)  09/03/23 257 lb 12.8 oz (116.9 kg)       Swallowing issues, if any: Yes, he reports 10/10 pain when swallowing. Smoking or chewing tobacco? Denies Using fluoride toothpaste daily? He reports using fluoride free toothpaste. Last ENT visit was on: 05/27/23 Other notable issues, if any: He reports not sleeping well at night.   PET in 2.5 months with office visit to follow. The patient was encouraged to call with any issues or questions before then.  BP 129/72   Pulse (!) 108   Temp 97.8 F (36.6 C)   Resp 20   Ht 6\' 1"  (1.854 m)   Wt 256 lb 9.6 oz (116.4 kg)   SpO2 100%   BMI 33.85 kg/m

## 2023-09-22 ENCOUNTER — Other Ambulatory Visit: Payer: Self-pay

## 2023-09-22 ENCOUNTER — Inpatient Hospital Stay: Attending: Nurse Practitioner

## 2023-09-22 VITALS — BP 124/65 | HR 70 | Temp 97.4°F | Resp 18 | Ht 73.0 in | Wt 258.0 lb

## 2023-09-22 DIAGNOSIS — R1310 Dysphagia, unspecified: Secondary | ICD-10-CM | POA: Insufficient documentation

## 2023-09-22 DIAGNOSIS — Z923 Personal history of irradiation: Secondary | ICD-10-CM | POA: Insufficient documentation

## 2023-09-22 DIAGNOSIS — B370 Candidal stomatitis: Secondary | ICD-10-CM | POA: Diagnosis not present

## 2023-09-22 DIAGNOSIS — Z79891 Long term (current) use of opiate analgesic: Secondary | ICD-10-CM | POA: Insufficient documentation

## 2023-09-22 DIAGNOSIS — E860 Dehydration: Secondary | ICD-10-CM | POA: Diagnosis not present

## 2023-09-22 DIAGNOSIS — R634 Abnormal weight loss: Secondary | ICD-10-CM | POA: Diagnosis not present

## 2023-09-22 DIAGNOSIS — C61 Malignant neoplasm of prostate: Secondary | ICD-10-CM | POA: Insufficient documentation

## 2023-09-22 DIAGNOSIS — C090 Malignant neoplasm of tonsillar fossa: Secondary | ICD-10-CM | POA: Diagnosis not present

## 2023-09-22 DIAGNOSIS — Z87891 Personal history of nicotine dependence: Secondary | ICD-10-CM | POA: Insufficient documentation

## 2023-09-22 DIAGNOSIS — G893 Neoplasm related pain (acute) (chronic): Secondary | ICD-10-CM | POA: Diagnosis not present

## 2023-09-22 DIAGNOSIS — Z8546 Personal history of malignant neoplasm of prostate: Secondary | ICD-10-CM | POA: Diagnosis not present

## 2023-09-22 MED ORDER — ONDANSETRON HCL 4 MG/2ML IJ SOLN
8.0000 mg | Freq: Once | INTRAMUSCULAR | Status: AC
  Administered 2023-09-22: 8 mg via INTRAVENOUS
  Filled 2023-09-22: qty 4

## 2023-09-22 MED ORDER — FAMOTIDINE IN NACL 20-0.9 MG/50ML-% IV SOLN
20.0000 mg | Freq: Once | INTRAVENOUS | Status: AC
  Administered 2023-09-22: 20 mg via INTRAVENOUS
  Filled 2023-09-22: qty 50

## 2023-09-22 MED ORDER — SODIUM CHLORIDE 0.9 % IV SOLN: Freq: Once | INTRAVENOUS | Status: AC

## 2023-09-22 MED ORDER — HEPARIN SOD (PORK) LOCK FLUSH 100 UNIT/ML IV SOLN
500.0000 [IU] | Freq: Once | INTRAVENOUS | Status: AC
  Administered 2023-09-22: 500 [IU]

## 2023-09-22 MED ORDER — MORPHINE SULFATE (PF) 4 MG/ML IV SOLN: 4.0000 mg | INTRAVENOUS | Status: DC | PRN

## 2023-09-22 MED ORDER — SODIUM CHLORIDE 0.9% FLUSH
10.0000 mL | Freq: Once | INTRAVENOUS | Status: AC
  Administered 2023-09-22: 10 mL

## 2023-09-22 NOTE — Patient Instructions (Signed)

## 2023-09-24 ENCOUNTER — Inpatient Hospital Stay

## 2023-09-24 VITALS — BP 138/59 | HR 80 | Temp 97.8°F | Resp 18 | Wt 257.6 lb

## 2023-09-24 DIAGNOSIS — R634 Abnormal weight loss: Secondary | ICD-10-CM | POA: Diagnosis not present

## 2023-09-24 DIAGNOSIS — G893 Neoplasm related pain (acute) (chronic): Secondary | ICD-10-CM | POA: Diagnosis not present

## 2023-09-24 DIAGNOSIS — C090 Malignant neoplasm of tonsillar fossa: Secondary | ICD-10-CM | POA: Diagnosis not present

## 2023-09-24 DIAGNOSIS — E860 Dehydration: Secondary | ICD-10-CM | POA: Diagnosis not present

## 2023-09-24 DIAGNOSIS — Z923 Personal history of irradiation: Secondary | ICD-10-CM | POA: Diagnosis not present

## 2023-09-24 DIAGNOSIS — Z87891 Personal history of nicotine dependence: Secondary | ICD-10-CM | POA: Diagnosis not present

## 2023-09-24 DIAGNOSIS — Z79891 Long term (current) use of opiate analgesic: Secondary | ICD-10-CM | POA: Diagnosis not present

## 2023-09-24 DIAGNOSIS — B370 Candidal stomatitis: Secondary | ICD-10-CM | POA: Diagnosis not present

## 2023-09-24 DIAGNOSIS — R1310 Dysphagia, unspecified: Secondary | ICD-10-CM | POA: Diagnosis not present

## 2023-09-24 MED ORDER — MORPHINE SULFATE (PF) 4 MG/ML IV SOLN
4.0000 mg | INTRAVENOUS | Status: DC | PRN
  Administered 2023-09-24: 4 mg via INTRAVENOUS
  Filled 2023-09-24: qty 1

## 2023-09-24 MED ORDER — SODIUM CHLORIDE 0.9 % IV SOLN: Freq: Once | INTRAVENOUS | Status: AC

## 2023-09-24 MED ORDER — FAMOTIDINE IN NACL 20-0.9 MG/50ML-% IV SOLN
20.0000 mg | Freq: Once | INTRAVENOUS | Status: AC
  Administered 2023-09-24: 20 mg via INTRAVENOUS
  Filled 2023-09-24: qty 50

## 2023-09-24 MED ORDER — SODIUM CHLORIDE 0.9% FLUSH
10.0000 mL | Freq: Once | INTRAVENOUS | Status: AC
  Administered 2023-09-24: 10 mL

## 2023-09-24 MED ORDER — ONDANSETRON HCL 4 MG/2ML IJ SOLN
8.0000 mg | Freq: Once | INTRAMUSCULAR | Status: AC
  Administered 2023-09-24: 8 mg via INTRAVENOUS
  Filled 2023-09-24: qty 4

## 2023-09-24 MED ORDER — HEPARIN SOD (PORK) LOCK FLUSH 100 UNIT/ML IV SOLN
500.0000 [IU] | Freq: Once | INTRAVENOUS | Status: AC
  Administered 2023-09-24: 500 [IU]

## 2023-09-24 NOTE — Patient Instructions (Signed)
 Dehydration, Adult Dehydration is a condition in which there is not enough water or other fluids in the body. This happens when a person loses more fluids than they take in. Important organs cannot work right without the right amount of fluids. Any loss of fluids from the body can cause dehydration. Dehydration can be mild, worse, or very bad. It should be treated right away to keep it from getting very bad. What are the causes? Conditions that cause loss of water in the body. They include: Watery poop (diarrhea). Vomiting. Sweating a lot. Fever. Infection. Peeing (urinating) a lot. Not drinking enough fluids. Certain medicines, such as medicines that take extra fluid out of the body (diuretics). Lack of safe drinking water. Not being able to get enough water and food. What increases the risk? Having a long-term (chronic) illness that has not been treated the right way, such as: Diabetes. Heart disease. Kidney disease. Being 25 years of age or older. Having a disability. Living in a place that is high above the ground or sea (high in altitude). The thinner, drier air causes more fluid loss. Doing exercises that put stress on your body for a long time. Being active when in hot places. What are the signs or symptoms? Symptoms of dehydration depend on how bad it is. Mild or worse dehydration Thirst. Dry lips or dry mouth. Feeling dizzy or light-headed. Muscle cramps. Passing little pee or dark pee. Pee may be the color of tea. Headache. Very bad dehydration Changes in skin. Skin may: Be cold to the touch (clammy). Be blotchy or pale. Not go back to normal right after you pinch it and let it go. Little or no tears, pee, or sweat. Fast breathing. Low blood pressure. Weak pulse. Pulse that is more than 100 beats a minute when you are sitting still. Other changes, such as: Feeling very thirsty. Eyes that look hollow (sunken). Cold hands and feet. Being confused. Being very  tired (lethargic) or having trouble waking from sleep. Losing weight. Loss of consciousness. How is this treated? Treatment for this condition depends on how bad your dehydration is. Treatment should start right away. Do not wait until your condition gets very bad. Very bad dehydration is an emergency. You will need to go to a hospital. Mild or worse dehydration can be treated at home. You may be asked to: Drink more fluids. Drink an oral rehydration solution (ORS). This drink gives you the right amount of fluids, salts, and minerals (electrolytes). Very bad dehydration can be treated: With fluids through an IV tube. By correcting low levels of electrolytes in the body. By treating the problem that caused your dehydration. Follow these instructions at home: Oral rehydration solution If told by your doctor, drink an ORS: Make an ORS. Use instructions on the package. Start by drinking small amounts, about  cup (120 mL) every 5-10 minutes. Slowly drink more until you have had the amount that your doctor said to have.  Eating and drinking  Drink enough clear fluid to keep your pee pale yellow. If you were told to drink an ORS, finish the ORS first. Then, start slowly drinking other clear fluids. Drink fluids such as: Water. Do not drink only water. Doing that can make the salt (sodium) level in your body get too low. Water from ice chips you suck on. Fruit juice that you have added water to (diluted). Low-calorie sports drinks. Eat foods that have the right amounts of salts and minerals, such as bananas, oranges, potatoes,  tomatoes, or spinach. Do not drink alcohol. Avoid drinks that have caffeine or sugar. These include:: High-calorie sports drinks. Fruit juice that you did not add water to. Soda. Coffee or energy drinks. Avoid foods that are greasy or have a lot of fat or sugar. General instructions Take over-the-counter and prescription medicines only as told by your doctor. Do  not take sodium tablets. Doing that can make the salt level in your body get too high. Return to your normal activities as told by your doctor. Ask your doctor what activities are safe for you. Keep all follow-up visits. Your doctor may check and change your treatment. Contact a doctor if: You have pain in your belly (abdomen) and the pain: Gets worse. Stays in one place. You have a rash. You have a stiff neck. You get angry or annoyed more easily than normal. You are more tired or have a harder time waking than normal. You feel weak or dizzy. You feel very thirsty. Get help right away if: You have any symptoms of very bad dehydration. You vomit every time you eat or drink. Your vomiting gets worse, does not go away, or you vomit blood or green stuff. You are getting treatment, but symptoms are getting worse. You have a fever. You have a very bad headache. You have: Diarrhea that gets worse or does not go away. Blood in your poop (stool). This may cause poop to look black and tarry. No pee in 6-8 hours. Only a small amount of pee in 6-8 hours, and the pee is very dark. You have trouble breathing. These symptoms may be an emergency. Get help right away. Call 911. Do not wait to see if the symptoms will go away. Do not drive yourself to the hospital. This information is not intended to replace advice given to you by your health care provider. Make sure you discuss any questions you have with your health care provider. Document Revised: 11/03/2021 Document Reviewed: 11/03/2021 Elsevier Patient Education  2024 [employer].

## 2023-09-29 ENCOUNTER — Other Ambulatory Visit: Payer: Self-pay | Admitting: Oncology

## 2023-09-29 ENCOUNTER — Inpatient Hospital Stay

## 2023-09-29 ENCOUNTER — Other Ambulatory Visit (HOSPITAL_COMMUNITY): Payer: Self-pay

## 2023-09-29 VITALS — BP 124/70 | HR 91 | Temp 98.5°F | Resp 16 | Wt 256.2 lb

## 2023-09-29 DIAGNOSIS — R634 Abnormal weight loss: Secondary | ICD-10-CM | POA: Diagnosis not present

## 2023-09-29 DIAGNOSIS — Z87891 Personal history of nicotine dependence: Secondary | ICD-10-CM | POA: Diagnosis not present

## 2023-09-29 DIAGNOSIS — C090 Malignant neoplasm of tonsillar fossa: Secondary | ICD-10-CM | POA: Diagnosis not present

## 2023-09-29 DIAGNOSIS — B370 Candidal stomatitis: Secondary | ICD-10-CM | POA: Diagnosis not present

## 2023-09-29 DIAGNOSIS — G893 Neoplasm related pain (acute) (chronic): Secondary | ICD-10-CM | POA: Diagnosis not present

## 2023-09-29 DIAGNOSIS — E860 Dehydration: Secondary | ICD-10-CM | POA: Diagnosis not present

## 2023-09-29 DIAGNOSIS — Z923 Personal history of irradiation: Secondary | ICD-10-CM | POA: Diagnosis not present

## 2023-09-29 DIAGNOSIS — Z79891 Long term (current) use of opiate analgesic: Secondary | ICD-10-CM | POA: Diagnosis not present

## 2023-09-29 DIAGNOSIS — R1310 Dysphagia, unspecified: Secondary | ICD-10-CM | POA: Diagnosis not present

## 2023-09-29 LAB — CMP (CANCER CENTER ONLY)
ALT: 13 U/L (ref 0–44)
AST: 18 U/L (ref 15–41)
Albumin: 3.2 g/dL — ABNORMAL LOW (ref 3.5–5.0)
Alkaline Phosphatase: 78 U/L (ref 38–126)
Anion gap: 4 — ABNORMAL LOW (ref 5–15)
BUN: 19 mg/dL (ref 8–23)
CO2: 30 mmol/L (ref 22–32)
Calcium: 8.4 mg/dL — ABNORMAL LOW (ref 8.9–10.3)
Chloride: 102 mmol/L (ref 98–111)
Creatinine: 0.48 mg/dL — ABNORMAL LOW (ref 0.61–1.24)
GFR, Estimated: >60 mL/min (ref >60)
Glucose, Bld: 181 mg/dL — ABNORMAL HIGH (ref 70–99)
Potassium: 3.6 mmol/L (ref 3.5–5.1)
Sodium: 136 mmol/L (ref 135–145)
Total Bilirubin: 0.5 mg/dL (ref 0.0–1.2)
Total Protein: 5.8 g/dL — ABNORMAL LOW (ref 6.5–8.1)

## 2023-09-29 LAB — CBC WITH DIFFERENTIAL (CANCER CENTER ONLY)
Abs Immature Granulocytes: 0.02 10*3/uL (ref 0.00–0.07)
Basophils Absolute: 0 10*3/uL (ref 0.0–0.1)
Basophils Relative: 1 %
Eosinophils Absolute: 0.1 10*3/uL (ref 0.0–0.5)
Eosinophils Relative: 2 %
HCT: 30 % — ABNORMAL LOW (ref 39.0–52.0)
Hemoglobin: 9.8 g/dL — ABNORMAL LOW (ref 13.0–17.0)
Immature Granulocytes: 1 %
Lymphocytes Relative: 14 %
Lymphs Abs: 0.6 10*3/uL — ABNORMAL LOW (ref 0.7–4.0)
MCH: 32 pg (ref 26.0–34.0)
MCHC: 32.7 g/dL (ref 30.0–36.0)
MCV: 98 fL (ref 80.0–100.0)
Monocytes Absolute: 0.5 10*3/uL (ref 0.1–1.0)
Monocytes Relative: 12 %
Neutro Abs: 2.9 10*3/uL (ref 1.7–7.7)
Neutrophils Relative %: 70 %
Platelet Count: 184 10*3/uL (ref 150–400)
RBC: 3.06 MIL/uL — ABNORMAL LOW (ref 4.22–5.81)
RDW: 16.5 % — ABNORMAL HIGH (ref 11.5–15.5)
WBC Count: 4.1 10*3/uL (ref 4.0–10.5)
nRBC: 0 % (ref 0.0–0.2)

## 2023-09-29 LAB — MAGNESIUM: Magnesium: 1.9 mg/dL (ref 1.7–2.4)

## 2023-09-29 MED ORDER — SODIUM CHLORIDE 0.9 % IV SOLN: Freq: Once | INTRAVENOUS | Status: AC

## 2023-09-29 MED ORDER — OLANZAPINE 5 MG PO TABS
5.0000 mg | ORAL_TABLET | Freq: Every day | ORAL | 1 refills | Status: DC
  Filled 2023-09-29: qty 30, 30d supply, fill #0

## 2023-09-29 MED ORDER — MORPHINE SULFATE (PF) 4 MG/ML IV SOLN
4.0000 mg | INTRAVENOUS | Status: DC | PRN
  Administered 2023-09-29: 4 mg via INTRAVENOUS
  Filled 2023-09-29: qty 1

## 2023-09-29 MED ORDER — FAMOTIDINE IN NACL 20-0.9 MG/50ML-% IV SOLN
20.0000 mg | Freq: Once | INTRAVENOUS | Status: AC
  Administered 2023-09-29: 20 mg via INTRAVENOUS
  Filled 2023-09-29: qty 50

## 2023-09-29 MED ORDER — OLANZAPINE 5 MG PO TABS
5.0000 mg | ORAL_TABLET | Freq: Once | ORAL | Status: AC
  Administered 2023-09-29: 5 mg via ORAL
  Filled 2023-09-29: qty 1

## 2023-09-29 MED ORDER — ONDANSETRON HCL 4 MG/2ML IJ SOLN
8.0000 mg | Freq: Once | INTRAMUSCULAR | Status: AC
  Administered 2023-09-29: 8 mg via INTRAVENOUS
  Filled 2023-09-29: qty 4

## 2023-09-29 MED ORDER — HEPARIN SOD (PORK) LOCK FLUSH 100 UNIT/ML IV SOLN
500.0000 [IU] | Freq: Once | INTRAVENOUS | Status: AC
  Administered 2023-09-29: 500 [IU]

## 2023-09-29 MED ORDER — SODIUM CHLORIDE 0.9% FLUSH
10.0000 mL | Freq: Once | INTRAVENOUS | Status: AC
  Administered 2023-09-29: 10 mL

## 2023-09-29 NOTE — Patient Instructions (Signed)

## 2023-09-29 NOTE — Progress Notes (Signed)
 Pt reported to [facility] today for IVFs. Pt asked this RN if he could receive IVFs 3x a week. This RN made Dr. Teretha Ferguson aware of Pt's request. Per Dr. Teretha Ferguson OK for Pt to receive IVFs 3x weekly as long as it could be accommodated. This RN made Pt aware. Pt expressed gratitude and verbalized understanding.

## 2023-09-30 ENCOUNTER — Telehealth: Payer: Self-pay | Admitting: *Deleted

## 2023-09-30 NOTE — Telephone Encounter (Signed)
 Returned patient's wife's phone call, spoke with patient's wife Vanice Genre)

## 2023-10-01 ENCOUNTER — Inpatient Hospital Stay: Admitting: Dietician

## 2023-10-01 ENCOUNTER — Inpatient Hospital Stay

## 2023-10-01 ENCOUNTER — Other Ambulatory Visit (HOSPITAL_COMMUNITY): Payer: Self-pay

## 2023-10-01 ENCOUNTER — Other Ambulatory Visit: Payer: Self-pay | Admitting: Oncology

## 2023-10-01 VITALS — BP 136/61 | HR 89 | Temp 97.8°F | Resp 16 | Ht 73.0 in | Wt 258.4 lb

## 2023-10-01 DIAGNOSIS — Z79891 Long term (current) use of opiate analgesic: Secondary | ICD-10-CM | POA: Diagnosis not present

## 2023-10-01 DIAGNOSIS — G893 Neoplasm related pain (acute) (chronic): Secondary | ICD-10-CM

## 2023-10-01 DIAGNOSIS — Z923 Personal history of irradiation: Secondary | ICD-10-CM | POA: Diagnosis not present

## 2023-10-01 DIAGNOSIS — B370 Candidal stomatitis: Secondary | ICD-10-CM | POA: Diagnosis not present

## 2023-10-01 DIAGNOSIS — R1310 Dysphagia, unspecified: Secondary | ICD-10-CM | POA: Diagnosis not present

## 2023-10-01 DIAGNOSIS — E860 Dehydration: Secondary | ICD-10-CM | POA: Diagnosis not present

## 2023-10-01 DIAGNOSIS — C090 Malignant neoplasm of tonsillar fossa: Secondary | ICD-10-CM

## 2023-10-01 DIAGNOSIS — Z87891 Personal history of nicotine dependence: Secondary | ICD-10-CM | POA: Diagnosis not present

## 2023-10-01 DIAGNOSIS — R634 Abnormal weight loss: Secondary | ICD-10-CM | POA: Diagnosis not present

## 2023-10-01 MED ORDER — SODIUM CHLORIDE 0.9 % IV SOLN: Freq: Once | INTRAVENOUS | Status: AC

## 2023-10-01 MED ORDER — SODIUM CHLORIDE 0.9% FLUSH
10.0000 mL | Freq: Once | INTRAVENOUS | Status: AC
  Administered 2023-10-01: 10 mL

## 2023-10-01 MED ORDER — ONDANSETRON HCL 4 MG/2ML IJ SOLN: 8.0000 mg | Freq: Once | INTRAMUSCULAR | Status: DC

## 2023-10-01 MED ORDER — FENTANYL 50 MCG/HR TD PT72
1.0000 | MEDICATED_PATCH | TRANSDERMAL | 0 refills | Status: DC
  Filled 2023-10-01: qty 10, 30d supply, fill #0

## 2023-10-01 MED ORDER — FAMOTIDINE IN NACL 20-0.9 MG/50ML-% IV SOLN
20.0000 mg | Freq: Once | INTRAVENOUS | Status: AC
  Administered 2023-10-01: 20 mg via INTRAVENOUS
  Filled 2023-10-01: qty 50

## 2023-10-01 MED ORDER — HEPARIN SOD (PORK) LOCK FLUSH 100 UNIT/ML IV SOLN
500.0000 [IU] | Freq: Once | INTRAVENOUS | Status: AC
  Administered 2023-10-01: 500 [IU]

## 2023-10-01 NOTE — Patient Instructions (Signed)
 Dehydration, Adult Dehydration is a condition in which there is not enough water or other fluids in the body. This happens when a person loses more fluids than they take in. Important organs cannot work right without the right amount of fluids. Any loss of fluids from the body can cause dehydration. Dehydration can be mild, worse, or very bad. It should be treated right away to keep it from getting very bad. What are the causes? Conditions that cause loss of water in the body. They include: Watery poop (diarrhea). Vomiting. Sweating a lot. Fever. Infection. Peeing (urinating) a lot. Not drinking enough fluids. Certain medicines, such as medicines that take extra fluid out of the body (diuretics). Lack of safe drinking water. Not being able to get enough water and food. What increases the risk? Having a long-term (chronic) illness that has not been treated the right way, such as: Diabetes. Heart disease. Kidney disease. Being 25 years of age or older. Having a disability. Living in a place that is high above the ground or sea (high in altitude). The thinner, drier air causes more fluid loss. Doing exercises that put stress on your body for a long time. Being active when in hot places. What are the signs or symptoms? Symptoms of dehydration depend on how bad it is. Mild or worse dehydration Thirst. Dry lips or dry mouth. Feeling dizzy or light-headed. Muscle cramps. Passing little pee or dark pee. Pee may be the color of tea. Headache. Very bad dehydration Changes in skin. Skin may: Be cold to the touch (clammy). Be blotchy or pale. Not go back to normal right after you pinch it and let it go. Little or no tears, pee, or sweat. Fast breathing. Low blood pressure. Weak pulse. Pulse that is more than 100 beats a minute when you are sitting still. Other changes, such as: Feeling very thirsty. Eyes that look hollow (sunken). Cold hands and feet. Being confused. Being very  tired (lethargic) or having trouble waking from sleep. Losing weight. Loss of consciousness. How is this treated? Treatment for this condition depends on how bad your dehydration is. Treatment should start right away. Do not wait until your condition gets very bad. Very bad dehydration is an emergency. You will need to go to a hospital. Mild or worse dehydration can be treated at home. You may be asked to: Drink more fluids. Drink an oral rehydration solution (ORS). This drink gives you the right amount of fluids, salts, and minerals (electrolytes). Very bad dehydration can be treated: With fluids through an IV tube. By correcting low levels of electrolytes in the body. By treating the problem that caused your dehydration. Follow these instructions at home: Oral rehydration solution If told by your doctor, drink an ORS: Make an ORS. Use instructions on the package. Start by drinking small amounts, about  cup (120 mL) every 5-10 minutes. Slowly drink more until you have had the amount that your doctor said to have.  Eating and drinking  Drink enough clear fluid to keep your pee pale yellow. If you were told to drink an ORS, finish the ORS first. Then, start slowly drinking other clear fluids. Drink fluids such as: Water. Do not drink only water. Doing that can make the salt (sodium) level in your body get too low. Water from ice chips you suck on. Fruit juice that you have added water to (diluted). Low-calorie sports drinks. Eat foods that have the right amounts of salts and minerals, such as bananas, oranges, potatoes,  tomatoes, or spinach. Do not drink alcohol. Avoid drinks that have caffeine or sugar. These include:: High-calorie sports drinks. Fruit juice that you did not add water to. Soda. Coffee or energy drinks. Avoid foods that are greasy or have a lot of fat or sugar. General instructions Take over-the-counter and prescription medicines only as told by your doctor. Do  not take sodium tablets. Doing that can make the salt level in your body get too high. Return to your normal activities as told by your doctor. Ask your doctor what activities are safe for you. Keep all follow-up visits. Your doctor may check and change your treatment. Contact a doctor if: You have pain in your belly (abdomen) and the pain: Gets worse. Stays in one place. You have a rash. You have a stiff neck. You get angry or annoyed more easily than normal. You are more tired or have a harder time waking than normal. You feel weak or dizzy. You feel very thirsty. Get help right away if: You have any symptoms of very bad dehydration. You vomit every time you eat or drink. Your vomiting gets worse, does not go away, or you vomit blood or green stuff. You are getting treatment, but symptoms are getting worse. You have a fever. You have a very bad headache. You have: Diarrhea that gets worse or does not go away. Blood in your poop (stool). This may cause poop to look black and tarry. No pee in 6-8 hours. Only a small amount of pee in 6-8 hours, and the pee is very dark. You have trouble breathing. These symptoms may be an emergency. Get help right away. Call 911. Do not wait to see if the symptoms will go away. Do not drive yourself to the hospital. This information is not intended to replace advice given to you by your health care provider. Make sure you discuss any questions you have with your health care provider. Document Revised: 11/03/2021 Document Reviewed: 11/03/2021 Elsevier Patient Education  2024 [employer].

## 2023-10-01 NOTE — Progress Notes (Unsigned)
 Nutrition Follow-up:  Pt with stage II SCC of right tonsil, p16 positive. He completed concurrent chemoradiation (first chemo 3/21; final RT 5/16). Patient is under the care of Dr. Lurena Sally and Dr. Randye Buttner.   S/p Gtube 2/28 - replaced at [facility] with non Enfit FT 5/28 DME: [employer]  Met with patient and wife in infusion.   Medications: zyprexa  (6/11)  Labs: glucose 181, albumin 3.2, Cr 0.48  Anthropometrics: Wt 258 lb 6.4 oz today - stable   6/6 - 257 lb 9.6 oz 5/16 - 257 lb 12.8 oz 4/29 - 266 lb 1.5 oz    Estimated Energy Needs  Kcals: 2800-3180 Protein: 140-153 Fluid: >2.8 L  NUTRITION DIAGNOSIS: Unintended wt loss - stable    INTERVENTION:  Continue 6 cartons Nutren 1.5 via tube Continue Prosource/equivalent BID - samples of prosource TF20 + Enfit adapter  Encourage use of suction machine Encourage po trials Support and encouragement     MONITORING, EVALUATION, GOAL: wt trends, intake   NEXT VISIT: Friday June 20 during IVF

## 2023-10-03 ENCOUNTER — Encounter: Payer: Self-pay | Admitting: Radiation Oncology

## 2023-10-03 ENCOUNTER — Encounter: Payer: Self-pay | Admitting: Oncology

## 2023-10-04 ENCOUNTER — Inpatient Hospital Stay

## 2023-10-04 ENCOUNTER — Encounter: Payer: Self-pay | Admitting: Radiology

## 2023-10-04 ENCOUNTER — Other Ambulatory Visit (HOSPITAL_COMMUNITY): Payer: Self-pay

## 2023-10-04 ENCOUNTER — Ambulatory Visit: Attending: Radiation Oncology

## 2023-10-04 VITALS — BP 132/63 | HR 92 | Temp 98.6°F | Resp 18

## 2023-10-04 DIAGNOSIS — E860 Dehydration: Secondary | ICD-10-CM | POA: Diagnosis not present

## 2023-10-04 DIAGNOSIS — R1310 Dysphagia, unspecified: Secondary | ICD-10-CM | POA: Diagnosis not present

## 2023-10-04 DIAGNOSIS — B370 Candidal stomatitis: Secondary | ICD-10-CM | POA: Diagnosis not present

## 2023-10-04 DIAGNOSIS — Z79891 Long term (current) use of opiate analgesic: Secondary | ICD-10-CM | POA: Diagnosis not present

## 2023-10-04 DIAGNOSIS — R634 Abnormal weight loss: Secondary | ICD-10-CM | POA: Diagnosis not present

## 2023-10-04 DIAGNOSIS — G893 Neoplasm related pain (acute) (chronic): Secondary | ICD-10-CM | POA: Diagnosis not present

## 2023-10-04 DIAGNOSIS — Z923 Personal history of irradiation: Secondary | ICD-10-CM | POA: Diagnosis not present

## 2023-10-04 DIAGNOSIS — C090 Malignant neoplasm of tonsillar fossa: Secondary | ICD-10-CM | POA: Diagnosis not present

## 2023-10-04 DIAGNOSIS — Z87891 Personal history of nicotine dependence: Secondary | ICD-10-CM | POA: Diagnosis not present

## 2023-10-04 MED ORDER — FAMOTIDINE IN NACL 20-0.9 MG/50ML-% IV SOLN
20.0000 mg | Freq: Once | INTRAVENOUS | Status: AC
  Administered 2023-10-04: 20 mg via INTRAVENOUS
  Filled 2023-10-04: qty 50

## 2023-10-04 MED ORDER — ONDANSETRON HCL 4 MG/2ML IJ SOLN
8.0000 mg | Freq: Once | INTRAMUSCULAR | Status: AC
  Administered 2023-10-04: 8 mg via INTRAVENOUS
  Filled 2023-10-04: qty 4

## 2023-10-04 MED ORDER — SODIUM CHLORIDE 0.9 % IV SOLN: Freq: Once | INTRAVENOUS | Status: AC

## 2023-10-04 NOTE — Progress Notes (Addendum)
 Radiation Oncology         [phone]) [phone] ________________________________  Name: Isaac Carpenter MRN: [id number]  Date: 10/05/2023  DOB: 1950/06/30  Follow-Up Visit Note  CC: [name], [name], MD  [name], [name], MD  Diagnosis and Prior Radiotherapy:       ICD-10-CM   1. Malignant neoplasm of tonsillar fossa (HCC)  C09.0          Cancer Staging  Malignant neoplasm of prostate [facility]) Staging form: Prostate, AJCC 8th Edition - Clinical stage from 05/13/2022: Stage IIC (cT1c, cN0, cM0, PSA: 13.2, Grade Group: 3) - Signed by Sherwood Rise, PA-C on 07/17/2022 Histopathologic type: Adenocarcinoma, NOS Stage prefix: Initial diagnosis Prostate specific antigen (PSA) range: 10 to 19 Gleason primary pattern: 4 Gleason secondary pattern: 3 Gleason score: 7 Histologic grading system: 5 grade system Number of biopsy cores examined: 12 Number of biopsy cores positive: 4 Location of positive needle core biopsies: Both sides  Malignant neoplasm of tonsillar fossa (HCC) Staging form: Pharynx - HPV-Mediated Oropharynx, AJCC 8th Edition - Clinical stage from 06/08/2023: Stage II (cT3, cN2, cM0, p16+) - Signed by Izell Domino, MD on 06/08/2023 Stage prefix: Initial diagnosis  ==========DELIVERED PLANS==========  First Treatment Date: 2023-07-12 Last Treatment Date: 2023-09-03   Plan Name: HN_R_Tonsil Site: Tonsil, Right Technique: IMRT Mode: Photon Dose Per Fraction: 2 Gy Prescribed Dose (Delivered / Prescribed): 70 Gy / 70 Gy Prescribed Fxs (Delivered / Prescribed): 35 / 35  Stage II (cT3, N2, 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 He was last seen in clinic on 09/21/2023. He completed his treatment on 09/03/2023.   Pain issues, if any:Yes, he reports 8/10 pain to  throat. He states that it is the worst with coughing up thick phlegm and swallowing.  Using a feeding tube?: Yes, pushing 6 cartons/day.  Weight changes, if any: Patient has not been able to tolerate oral intake.  Wt Readings from Last 3 Encounters:  10/05/23 253 lb (114.8 kg)  10/01/23 258 lb 6.4 oz (117.2 kg)  09/29/23 256 lb 4 oz (116.2 kg)  Swallowing issues, if any: Yes, but met with speech therapy on 10/04/23. Smoking or chewing tobacco? Denies Using fluoride toothpaste daily? Using Fluoride free toothpaste. Last ENT visit was on: 05/27/2023 Other notable issues, if any: His wife reports that he was very groggy and verbally hallucinating last night on Fentanyl  patch. She stopped his Fentanyl  patch on 10/03/2023. Patient is currently using Lidocaine  and Hycet PRN.   ALLERGIES:  is allergic to other and testosterone.  Meds: Current Outpatient Medications  Medication Sig Dispense Refill   aspirin 81 MG chewable tablet Chew 81 mg by mouth daily.     doxazosin  (CARDURA ) 1 MG tablet TAKE ONE TABLET BY MOUTH AT BEDTIME 30 tablet 1   esomeprazole  (NEXIUM ) 40 MG capsule Take 1 capsule (40 mg total) as directed daily at 12 noon. See instructions to administer through Tube. 30 capsule 0   fentaNYL  (DURAGESIC ) 50 MCG/HR Place 1 patch onto the skin every 3 (three) days. 10 patch 0   levothyroxine  (SYNTHROID ) 88 MCG tablet TAKE ONE TABLET BY MOUTH DAILY. 90 tablet 2   lidocaine  (XYLOCAINE ) 2 % solution Patient: Mix 1part 2% viscous lidocaine , 1part water . Swish and spit 10mL of diluted mixture every 2 hours as needed. 300 mL 3  lidocaine -prilocaine  (EMLA ) cream Apply to affected area once 30 g 3   Nutritional Supplements (NUTREN 1.5) LIQD 2 cartons Nutren 1.5 (500 ml) QID via tube. Flush with 60 ml water  before/after each bolus. Provide additional 250 ml water  flush 4x/day in between feedings to meet hydration needs. Provides 3000 kcal, 136 g protein, 1528 ml free water  (3008 ml total water ) 2000  ml/day meets 100% DRI     OLANZapine  (ZYPREXA ) 5 MG tablet Take 1 tablet (5 mg total) by mouth at bedtime. 30 tablet 1   ondansetron  (ZOFRAN -ODT) 8 MG disintegrating tablet Take 1 tablet (8 mg total) by mouth every 8 (eight) hours as needed for nausea or vomiting. 30 tablet 2   solifenacin (VESICARE) 5 MG tablet Take 5 mg by mouth daily.     ALPRAZolam  (XANAX ) 1 MG tablet Take 0.5 tablets (0.5 mg total) by mouth 2 (two) times daily as needed for anxiety. (Patient not taking: Reported on 10/04/2023) 60 tablet 0   dexamethasone  (DEXAMETHASONE  INTENSOL) 1 MG/ML solution TAKE BY MOUTH OR G-TUBE ONCE DAILY FOR THREE DAYS AFTER CHEMO (Patient not taking: Reported on 10/04/2023) 30 mL 0   fluconazole  (DIFLUCAN ) 40 MG/ML suspension Take 5mL today, then 2.5mL daily for 20 more days. HOLD ATORVASTATIN  WHILE ON THIS. (Patient not taking: Reported on 10/04/2023) 55 mL 0   HYDROcodone -acetaminophen  (HYCET) 7.5-325 mg/15 ml solution Take 10 mLs by mouth 4 (four) times daily as needed for moderate pain (pain score 4-6). 500 mL 0   magic mouthwash (nystatin , diphenhydrAMINE, alum & mag hydroxide) suspension mixture Take 5 mLs by mouth 3 (three) times daily as directed. (Patient not taking: Reported on 10/04/2023) 150 mL 1   metoCLOPramide  (REGLAN ) 10 MG/10ML SOLN Place 10 mLs (10 mg total) into feeding tube 3 (three) times daily before meals. (Patient not taking: Reported on 10/04/2023) 900 mL 0   metoCLOPramide  (REGLAN ) 5 MG/5ML solution PLACE INTO FEEDING TUBE THREE TIMES DAILY BEFORE MEALS (Patient not taking: Reported on 10/04/2023) 900 mL 0   prochlorperazine  (COMPAZINE ) 10 MG tablet Take 1 tablet (10 mg total) by mouth every 6 (six) hours as needed (Nausea or vomiting). (Patient not taking: Reported on 10/04/2023) 30 tablet 1   traZODone  (DESYREL ) 50 MG tablet Take 1 tablet (50 mg total) by mouth at bedtime. (Patient not taking: Reported on 10/04/2023) 30 tablet 1   No current facility-administered medications  for this encounter.    Physical Findings:   Wt Readings from Last 3 Encounters:  10/05/23 253 lb (114.8 kg)  10/01/23 258 lb 6.4 oz (117.2 kg)  09/29/23 256 lb 4 oz (116.2 kg)    height is 6' 1 (1.854 m) and weight is 253 lb (114.8 kg). His temporal temperature is 97.3 F (36.3 C) (abnormal). His blood pressure is 110/77 and his pulse is 93. His respiration is 18 and oxygen saturation is 95%. .  General: Ill-appearing male, alert and oriented HEENT: Head is normocephalic. Extraocular movements are intact. Oropharynx is notable for mucositis changes to the tongue. No evidence of thrush.  Neck: Neck is notable for no palpable adenopathy Skin: Skin in treatment fields shows dry desquamation Heart: Regular in rate and regular rhythm with no murmurs, rubs, or gallops. Chest: Clear to auscultation bilaterally, with no rhonchi, wheezes, or rales. Abdomen: Soft, nontender, nondistended, with no rigidity or guarding.  Extremities: No cyanosis or edema. Lymphatics: see Neck Exam Psychiatric: Judgment and insight are intact. Affect is appropriate.   Lab Findings: Lab Results  Component Value  Date   WBC 4.1 09/29/2023   HGB 9.8 (L) 09/29/2023   HCT 30.0 (L) 09/29/2023   MCV 98.0 09/29/2023   PLT 184 09/29/2023    Lab Results  Component Value Date   TSH 2.872 06/14/2023    Radiographic Findings: No results found.  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   Patient is not able to tolerate oral intake. His wife states he feels better after IV fluids. He is currently receiving fluids three times/week. Patient and his wife feel like these are helping.   2) Nutritional Status: Patient is not tolerating oral intake. Encouraged him to try this with liquids and soft foods as he  starts to feel better.   Wt Readings from Last 3 Encounters:  09/21/23 256 lb 9.6 oz (116.4 kg)  09/16/23 258 lb (117 kg)  09/03/23 257 lb 12.8 oz (116.9 kg)   PEG tube: In-place, using 6 cartons/day  3) Risk Factors: The patient has been educated about risk factors including alcohol and tobacco abuse; they understand that avoidance of alcohol and tobacco is important to prevent recurrences as well as other cancers. Patient is not smoking.   4) Swallowing: Continues to follow with SLP. He is scheduled to see   5) Dental: Encouraged to continue regular followup with dentistry, and dental hygiene including fluoride rinses.   6) Thyroid  function: Checking annually Lab Results  Component Value Date   TSH 2.872 06/14/2023    7) Other: Mucositis/xerostomia - encouraged saltwater/baking soda rinse as well as dry mouth remedies (regular sips of fluid, lozenges, biotene gel, etc.)   Pain: Patient is experiencing 8/10 pain with coughing and swallowing. He is no longer using the Fentanyl  patches due to hallucinations. Patient is not sleeping well at night due to being propped up.  Encouraged regular Hycet use (q3h as prescribed) to stay on-top of pain and xylocaine  as needed.  8) Telephone follow-up in 1 week. PET and office visit scheduled for late August. The patient was encouraged to call with any issues or questions before then.  On date of service, in total, I spent 30 minutes on this encounter. Patient was seen in person. _____________________________________    Leeroy Due, PA-C

## 2023-10-04 NOTE — Patient Instructions (Signed)
 Dehydration, Adult Dehydration is a condition in which there is not enough water or other fluids in the body. This happens when a person loses more fluids than they take in. Important organs cannot work right without the right amount of fluids. Any loss of fluids from the body can cause dehydration. Dehydration can be mild, worse, or very bad. It should be treated right away to keep it from getting very bad. What are the causes? Conditions that cause loss of water in the body. They include: Watery poop (diarrhea). Vomiting. Sweating a lot. Fever. Infection. Peeing (urinating) a lot. Not drinking enough fluids. Certain medicines, such as medicines that take extra fluid out of the body (diuretics). Lack of safe drinking water. Not being able to get enough water and food. What increases the risk? Having a long-term (chronic) illness that has not been treated the right way, such as: Diabetes. Heart disease. Kidney disease. Being 25 years of age or older. Having a disability. Living in a place that is high above the ground or sea (high in altitude). The thinner, drier air causes more fluid loss. Doing exercises that put stress on your body for a long time. Being active when in hot places. What are the signs or symptoms? Symptoms of dehydration depend on how bad it is. Mild or worse dehydration Thirst. Dry lips or dry mouth. Feeling dizzy or light-headed. Muscle cramps. Passing little pee or dark pee. Pee may be the color of tea. Headache. Very bad dehydration Changes in skin. Skin may: Be cold to the touch (clammy). Be blotchy or pale. Not go back to normal right after you pinch it and let it go. Little or no tears, pee, or sweat. Fast breathing. Low blood pressure. Weak pulse. Pulse that is more than 100 beats a minute when you are sitting still. Other changes, such as: Feeling very thirsty. Eyes that look hollow (sunken). Cold hands and feet. Being confused. Being very  tired (lethargic) or having trouble waking from sleep. Losing weight. Loss of consciousness. How is this treated? Treatment for this condition depends on how bad your dehydration is. Treatment should start right away. Do not wait until your condition gets very bad. Very bad dehydration is an emergency. You will need to go to a hospital. Mild or worse dehydration can be treated at home. You may be asked to: Drink more fluids. Drink an oral rehydration solution (ORS). This drink gives you the right amount of fluids, salts, and minerals (electrolytes). Very bad dehydration can be treated: With fluids through an IV tube. By correcting low levels of electrolytes in the body. By treating the problem that caused your dehydration. Follow these instructions at home: Oral rehydration solution If told by your doctor, drink an ORS: Make an ORS. Use instructions on the package. Start by drinking small amounts, about  cup (120 mL) every 5-10 minutes. Slowly drink more until you have had the amount that your doctor said to have.  Eating and drinking  Drink enough clear fluid to keep your pee pale yellow. If you were told to drink an ORS, finish the ORS first. Then, start slowly drinking other clear fluids. Drink fluids such as: Water. Do not drink only water. Doing that can make the salt (sodium) level in your body get too low. Water from ice chips you suck on. Fruit juice that you have added water to (diluted). Low-calorie sports drinks. Eat foods that have the right amounts of salts and minerals, such as bananas, oranges, potatoes,  tomatoes, or spinach. Do not drink alcohol. Avoid drinks that have caffeine or sugar. These include:: High-calorie sports drinks. Fruit juice that you did not add water to. Soda. Coffee or energy drinks. Avoid foods that are greasy or have a lot of fat or sugar. General instructions Take over-the-counter and prescription medicines only as told by your doctor. Do  not take sodium tablets. Doing that can make the salt level in your body get too high. Return to your normal activities as told by your doctor. Ask your doctor what activities are safe for you. Keep all follow-up visits. Your doctor may check and change your treatment. Contact a doctor if: You have pain in your belly (abdomen) and the pain: Gets worse. Stays in one place. You have a rash. You have a stiff neck. You get angry or annoyed more easily than normal. You are more tired or have a harder time waking than normal. You feel weak or dizzy. You feel very thirsty. Get help right away if: You have any symptoms of very bad dehydration. You vomit every time you eat or drink. Your vomiting gets worse, does not go away, or you vomit blood or green stuff. You are getting treatment, but symptoms are getting worse. You have a fever. You have a very bad headache. You have: Diarrhea that gets worse or does not go away. Blood in your poop (stool). This may cause poop to look black and tarry. No pee in 6-8 hours. Only a small amount of pee in 6-8 hours, and the pee is very dark. You have trouble breathing. These symptoms may be an emergency. Get help right away. Call 911. Do not wait to see if the symptoms will go away. Do not drive yourself to the hospital. This information is not intended to replace advice given to you by your health care provider. Make sure you discuss any questions you have with your health care provider. Document Revised: 11/03/2021 Document Reviewed: 11/03/2021 Elsevier Patient Education  2024 [employer].

## 2023-10-04 NOTE — Progress Notes (Signed)
 Patient identity verified x2 The patient returns today for routine follow-up to see Julio Ohm PA-C. He completed his treatment on 09/03/2023. Site: Tonsil, Right    Pain issues, if any:Yes, he reports 8/10 pain to throat. He states that it is sore and feels like spikes when swallowing. Using a feeding tube?: No issues Weight changes, if any:  Wt Readings from Last 3 Encounters: 10/05/23          253.0lbs  10/01/23 258 lbs 6.4 oz (117.2 kg)  09/29/23 256 lbs 4 oz (116.2 kg)      Swallowing issues, if any: Yes, but met with speech therapy on 10/04/23. Smoking or chewing tobacco? Denies Using fluoride toothpaste daily? Using Fluoride free toothpaste. Last ENT visit was on: 05/27/2023 Other notable issues, if any: Laura(significant other) reports that he was very groggy and verbally hallucinating last night on Fentanyl  patch.   BP 110/77 (BP Location: Left Arm, Patient Position: Sitting)   Pulse 93   Temp (!) 97.3 F (36.3 C) (Temporal)   Resp 18   Ht 6' 1 (1.854 m)   Wt 253 lb (114.8 kg)   SpO2 95%   BMI 33.38 kg/m

## 2023-10-04 NOTE — Therapy (Signed)
 OUTPATIENT SPEECH LANGUAGE PATHOLOGY ONCOLOGY TREATMENT   Patient Name: Isaac Carpenter MRN: [id number] DOB:[birth year], 73 y.o., male Today's Date: 10/04/2023  PCP: [name], Betty, MD REFERRING PROVIDER: Colie Dawes, MD  END OF SESSION:  End of Session - 10/04/23 1608     Visit Number 3    Number of Visits 7    Date for SLP Re-Evaluation 10/20/23    SLP Start Time 1239   checked in 1237   SLP Stop Time  1320    SLP Time Calculation (min) 41 min    Activity Tolerance Patient tolerated treatment well;Patient limited by pain            Past Medical History:  Diagnosis Date   Arthritis    Cancer (HCC)    History of partial thyroidectomy STATES OVERACTIVE THYROID -- NO ISSUES SINCE AGE 78 AND NO MEDS   Hyperlipidemia    Hypertension    Hypothyroidism    Sleep apnea    Per patient he tried CPAP but could not tolerate.   Thyroid  disease    Past Surgical History:  Procedure Laterality Date   CIRCUMCISION  01/04/2012   Procedure: CIRCUMCISION ADULT;  Surgeon: Isaac Gouge, MD;  Location: [facility];  Service: Urology;  Laterality: N/A;   COLONOSCOPY  04/08/2020   Isaac Carpenter   CYSTOSCOPY  09/04/2022   Procedure: CYSTOSCOPY;  Surgeon: Isaac Frizzle, MD;  Location: [facility];  Service: Urology;;   IR GASTROSTOMY TUBE MOD SED  06/18/2023   IR IMAGING GUIDED PORT INSERTION  06/18/2023   RADIOACTIVE SEED IMPLANT N/A 09/04/2022   Procedure: RADIOACTIVE SEED IMPLANT/BRACHYTHERAPY IMPLANT;  Surgeon: Isaac Frizzle, MD;  Location: [facility];  Service: Urology;  Laterality: N/A;  90 MINS   SPACE OAR INSTILLATION N/A 09/04/2022   Procedure: SPACE OAR INSTILLATION;  Surgeon: Isaac Frizzle, MD;  Location: [facility];  Service: Urology;  Laterality: N/A;   THYROIDECTOMY, PARTIAL  AGE 78   OVERACTIVE THYROID    Patient Active Problem List   Diagnosis Date Noted   Cancer (HCC) 08/14/2023   Febrile  neutropenia (HCC) 08/14/2023   S/P percutaneous endoscopic gastrostomy (PEG) tube placement (HCC) 08/14/2023   Hyponatremia 08/14/2023   Normocytic anemia 08/14/2023   Chemotherapy induced neutropenia (HCC) 07/30/2023   Fatigue 07/16/2023   Situational anxiety 07/09/2023   Encounter for antineoplastic chemotherapy 07/09/2023   Dysphagia 07/09/2023   Port-A-Cath in place 07/05/2023   Sensorineural hearing loss (SNHL) of right ear 06/23/2023   Cancer associated pain 06/11/2023   Coordination of complex care 06/11/2023   Malignant neoplasm of tonsillar fossa (HCC) 06/08/2023   Malignant neoplasm of prostate (HCC) 07/17/2022   Hyperlipidemia 05/11/2021   Morbid obesity (HCC)-BMI 37.8 with DM II,HLD,OSA,HTN 05/09/2021   Hypertension, essential, benign 07/23/2020   OSA (obstructive sleep apnea) 07/23/2020   Type 2 diabetes mellitus with other specified complication (HCC) 01/31/2020   BPH associated with nocturia 01/31/2020   Hypothyroidism, postsurgical 01/31/2020    ONSET DATE: see pertinent history   REFERRING DIAG: Malignant Neoplasm of tonsillar fossa  THERAPY DIAG:  Dysphagia, unspecified type  Rationale for Evaluation and Treatment: Rehabilitation  SUBJECTIVE:   SUBJECTIVE STATEMENT: 10/10. 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 He presented to his PCP on 05/17/23 with complaints of right sided jaw/maxillary pain when coughing. 05/17/23 He underwent a CT neck  which revealed an ulcerated enhancing mass involving the superior oropharynx and soft palate/uvula measuring 3.4 x 3.0 x 3.6 cm in the greatest extent that is concerning for malignancy. No evidence of cervical lymphadenopathy was indicated on scan. 05/27/23 He saw Isaac Carpenter. She completed a laryngoscopy with biopsies which revealed Invasive moderately differentiated SCC, p 16  +. 06/04/23 visit with Isaac Carpenter for worsening symptoms of odynophagia and dysphagia. He was unable to maintain adequate nutrition. 06/07/23 PET revealing a right oropharyngeal tumor >4cm, likely b/l level II adenopathy, no distant metastases. Consult with Isaac Carpenter 06/08/23, patient reported extreme pain when swallowing that prevents him from eating or swallowing, PEG tube scheduled. 2/20 consult with Isaac Carpenter. He will receive chemotherapy/radiation. Treatment plan:  He will receive 35 fractions of radiation to his right Tonsil and bilateral neck with weekly chemotherapy which started on 07/12/23 and will complete 08/27/23. Pretreatment procedures: PEG/PAC 06/18/23. MBSS 06/28/23. When he presented for consult he had severe dysphagia and PEG was placed quickly. He did recover from the dysphagia (patient thinks it was related to biopsy pain).  PAIN:  Are you having pain? Yes: NPRS scale: 3/10 Pain location: Throat Pain description: sore, spikes Aggravating factors: swallowing - 10/10 Relieving factors: meds, but negatively affecting pt's awareness/attention   FALLS: Has patient fallen in last 6 months?  No  PATIENT GOALS: Maintain WNL swallowing  OBJECTIVE:  Note: Objective measures were completed at Evaluation unless otherwise noted. DIAGNOSTIC FINDINGS: See Pertinent history above  INSTRUMENTAL SWALLOW STUDY FINDINGS (MBSS) 06/28/23 Clinical Impression: Patient presents with an oropharyngeal swallow that is largely [facility]. No penetration or aspiration occured during any phase of the swallow with any of the tested liquid or solid consistencies. Anterior hyoid excursion appeared partial in completion but patient with full epiglottic inversion and laryngeal vestibule closure. With solids, he did exhibit increased amount of vallecular residuals but with clearance with subsequent swallows and sips of liquids. PES opening appeared [facility] and no retrograde flow of barium observed in upper esophagus. Trace  amount of barium residue remained in oropharynx at level of known mass, however barium fully cleared with subsequent swallows. Barium tablet transit appeared to stall in distal esophagus.    SLP recommends referral to OP SLP secondary to planned oropharyngeal chemoradiation treatment.   Recommendations/Plan: Swallowing Evaluation Recommendations PO Diet Recommendation: Regular; Thin liquids (Level 0) Liquid Administration via: Cup; Straw Medication Administration: Whole meds with liquid Supervision: Patient able to self-feed Swallowing strategies  : Follow solids with liquids Postural changes: Position pt fully upright for meals Oral care recommendations: Oral care BID (2x/day)                                                                                                                            TREATMENT DATE:   10/04/23: Pt with rough voice today. Isaac Carpenter reports he was very groggy and verbally hallucinating last night on Fentanyl  patch. She removed the patch and is picking up a lower dose  today after pt obtains fluids at [facility]. He has had minimal PO daily. Today pt had multiple teaspoons of water  without overt s/sx aspiration. When he took one of three cup sips, pt had violent coughing. SLP told pt to have multiple teaspoons water  (preferably warm), 8-10 times a day and explained rationale for this. SLP ascertained through having pt take different SIZE TEASPOON SIPS that pt's odynophagia is ASSOCIATED WITH SIP SIZE. SLP told pt to have teaspoon sips and pain did not incr greater than 3/10. With HEP, pt has not been completing as directed. SLP again reinforced rationale for HEP and pt's performance was WNL with rare min A for tongue protrusion. SLP told pt to perform with drops of water  as he needs. Breathing sounded slightly labored today but after pt coughed, breathing was not sounding as labored. Sixty seconds later pt's breathing became more labored again, but not as labored as originally.  Isaac Carpenter reports that when he coughs and frees phlegm, it is hardened. SLP strongly suggested pt drink teaspoons of warm water  to more easily free dried phlegm (assumed) from pharynx. Wife is running humidifiers in the home.   09/02/23: Pt c/o pain in throat 10/10. Is not doing any reps of HEP currently, and has not done any since last ST session. Pt told SLP rationale of HEP with independence. Pt took 1/2-3/4 teaspoons water  today without s/sx oral, or overt s/sx pharyngeal deficits. SLP encouraged pt to have at least 2-3 teaspoons of water  at least 2-3 times/day and increase this as pt is able, up to small sips of water  multiple times per day. SLP reminded pt about muscle disuse atrophy and that this was possible due to the time pt has not had anything PO. SLP educated pt and SO about food journal.  SLP demonstrated each exercise of HEP (Except Shaker) with pt and SO, and described a few examples of how pt could add HEP back into his daily routine, with goal being at least 20 reps of effortful, Masako, and Mendelsohn per day. SLP reiterated pt could perform multiple reps of Shaker without the need to swallow and encouraged pt to perform this exercise 9-12 times/day starting today.  07/22/23: Research states the risk for dysphagia increases due to radiation and/or chemotherapy treatment due to a variety of factors, so SLP educated the pt about the possibility of reduced/limited ability for PO intake during rad tx. SLP also educated pt regarding possible changes to swallowing musculature after rad tx, and why adherence to dysphagia HEP provided today and PO consumption was necessary to inhibit muscle fibrosis following rad tx and to mitigate muscle disuse atrophy. SLP informed pt why this would be detrimental to their swallowing status and to their pulmonary health. Pt demonstrated understanding of these things to SLP. SLP encouraged pt to safely eat and drink as deep into their radiation/chemotherapy as possible to  provide the best possible long-term swallowing outcome for pt.  SLP then developed an individualized HEP for pt involving oral and pharyngeal strengthening and ROM and pt was instructed how to perform these exercises, including SLP demonstration. After SLP demonstration, pt return demonstrated each exercise. SLP ensured pt performance was correct prior to educating pt on next exercise. Pt required min-mod cues faded to modified independent to perform HEP. Pt was instructed to complete this program 5-7 days/week, at least 20 reps a day until 6 months after his last day of rad tx, and then x2/week after that, indefinitely. Among other modifications for days when pt cannot  functionally swallow, SLP also suggested pt to perform only non-swallowing tasks on the handout/HEP, and if necessary to cycle through the swallowing portion so the full program of exercises can be completed instead of fatiguing on one of the swallowing exercises and being unable to perform the other swallowing exercises. SLP instructed that swallowing exercises should then be added back into the regimen as pt is able to do so.   PATIENT EDUCATION: Education details: late effects head/neck radiation on swallow function, HEP procedure, and modification to HEP when difficulty experienced with swallowing during and after radiation course Person educated: Patient and Caregiver Isaac Carpenter Education method: Explanation, Demonstration, Verbal cues, and Handouts Education comprehension: verbalized understanding, returned demonstration, verbal cues required, and needs further education   ASSESSMENT:  CLINICAL IMPRESSION: Moved goal for s/sx aspiration PNA to LTGs. Patient is a 73 y.o. M who was seen today for treatment of swallowing after radiation/chemoradiation therapy. See treatment date for details on today's session. There are no overt s/s aspiration PNA observed by SLP nor any reported by pt at this time. Data indicate that pt's swallow  ability could very well decline over time following the conclusion of rad therapy due to muscle disuse atrophy and/or muscle fibrosis. Pt will cont to need to be seen by SLP in order to assess safety of PO intake, assess the need for recommending any objective swallow assessment, and ensuring pt is correctly completing the individualized HEP.  OBJECTIVE IMPAIRMENTS: include dysphagia. These impairments are limiting patient from safety when swallowing. Factors affecting potential to achieve goals and functional outcome are none noted today. Patient will benefit from skilled SLP services to address above impairments and improve overall function.  REHAB POTENTIAL: Good   GOALS: Goals reviewed with patient? No  SHORT TERM GOALS: Target: 3rd total session   1. Pt will compelte HEP with modified independence in 2 sessions Baseline: Goal status: not met   2.  pt will tell SLP why pt is completing HEP with modified independence Baseline:  Goal status: Met   3.  pt will describe 3 overt s/s aspiration PNA with modified independence Baseline:  Goal status: moved to LTG   4.  pt will tell SLP how a food journal could hasten return to a more normalized diet Baseline:  Goal status: Met     LONG TERM GOALS: Target: 7th total session   1.  pt will complete HEP with independence over two visits Baseline:  Goal status: INITIAL   2.  pt will describe how to modify HEP over time, and the timeline associated with reduction in HEP frequency with modified independence over two sessions Baseline:  Goal status: INITIAL  3.  pt will describe 3 overt s/s aspiration PNA with modified independence Baseline:  Goal status: INITIAL     PLAN:   SLP FREQUENCY:  once approx every 4 weeks   SLP DURATION:  7 sessions   PLANNED INTERVENTIONS: Aspiration precaution training, Pharyngeal strengthening exercises, Diet toleration management , Trials of upgraded texture/liquids, SLP instruction and feedback,  Compensatory strategies, and Patient/family education, [phone] (treatment of swallowing dysfunction and/or oral function for feeding)   Vida Nicol, CCC-SLP 10/04/2023, 4:09 PM

## 2023-10-05 ENCOUNTER — Ambulatory Visit
Admission: RE | Admit: 2023-10-05 | Discharge: 2023-10-05 | Disposition: A | Discharge status: Home / Self Care | Source: Ambulatory Visit | Attending: Radiology | Admitting: Radiology

## 2023-10-05 VITALS — BP 110/77 | HR 93 | Temp 97.3°F | Resp 18 | Ht 73.0 in | Wt 253.0 lb

## 2023-10-05 DIAGNOSIS — Z79890 Hormone replacement therapy (postmenopausal): Secondary | ICD-10-CM | POA: Insufficient documentation

## 2023-10-05 DIAGNOSIS — Z7952 Long term (current) use of systemic steroids: Secondary | ICD-10-CM | POA: Diagnosis not present

## 2023-10-05 DIAGNOSIS — Z7982 Long term (current) use of aspirin: Secondary | ICD-10-CM | POA: Diagnosis not present

## 2023-10-05 DIAGNOSIS — K117 Disturbances of salivary secretion: Secondary | ICD-10-CM | POA: Diagnosis not present

## 2023-10-05 DIAGNOSIS — Z923 Personal history of irradiation: Secondary | ICD-10-CM | POA: Diagnosis not present

## 2023-10-05 DIAGNOSIS — C090 Malignant neoplasm of tonsillar fossa: Secondary | ICD-10-CM | POA: Insufficient documentation

## 2023-10-05 DIAGNOSIS — Z79899 Other long term (current) drug therapy: Secondary | ICD-10-CM | POA: Diagnosis not present

## 2023-10-06 ENCOUNTER — Inpatient Hospital Stay

## 2023-10-06 VITALS — BP 128/67 | HR 70 | Temp 98.2°F | Resp 16 | Wt 259.2 lb

## 2023-10-06 DIAGNOSIS — Z79891 Long term (current) use of opiate analgesic: Secondary | ICD-10-CM | POA: Diagnosis not present

## 2023-10-06 DIAGNOSIS — B370 Candidal stomatitis: Secondary | ICD-10-CM | POA: Diagnosis not present

## 2023-10-06 DIAGNOSIS — C090 Malignant neoplasm of tonsillar fossa: Secondary | ICD-10-CM

## 2023-10-06 DIAGNOSIS — R634 Abnormal weight loss: Secondary | ICD-10-CM | POA: Diagnosis not present

## 2023-10-06 DIAGNOSIS — G893 Neoplasm related pain (acute) (chronic): Secondary | ICD-10-CM | POA: Diagnosis not present

## 2023-10-06 DIAGNOSIS — Z923 Personal history of irradiation: Secondary | ICD-10-CM | POA: Diagnosis not present

## 2023-10-06 DIAGNOSIS — E860 Dehydration: Secondary | ICD-10-CM | POA: Diagnosis not present

## 2023-10-06 DIAGNOSIS — Z87891 Personal history of nicotine dependence: Secondary | ICD-10-CM | POA: Diagnosis not present

## 2023-10-06 DIAGNOSIS — R1310 Dysphagia, unspecified: Secondary | ICD-10-CM | POA: Diagnosis not present

## 2023-10-06 MED ORDER — SODIUM CHLORIDE 0.9 % IV SOLN: Freq: Once | INTRAVENOUS | Status: AC

## 2023-10-06 MED ORDER — MORPHINE SULFATE (PF) 4 MG/ML IV SOLN
4.0000 mg | Freq: Once | INTRAVENOUS | Status: AC
  Administered 2023-10-06: 4 mg via INTRAVENOUS
  Filled 2023-10-06: qty 1

## 2023-10-06 MED ORDER — HEPARIN SOD (PORK) LOCK FLUSH 100 UNIT/ML IV SOLN
500.0000 [IU] | Freq: Once | INTRAVENOUS | Status: AC
  Administered 2023-10-06: 500 [IU]

## 2023-10-06 MED ORDER — ONDANSETRON HCL 4 MG/2ML IJ SOLN
8.0000 mg | Freq: Once | INTRAMUSCULAR | Status: AC
  Administered 2023-10-06: 8 mg via INTRAVENOUS
  Filled 2023-10-06: qty 4

## 2023-10-06 MED ORDER — SODIUM CHLORIDE 0.9% FLUSH
10.0000 mL | Freq: Once | INTRAVENOUS | Status: AC
  Administered 2023-10-06: 10 mL

## 2023-10-06 MED ORDER — FAMOTIDINE IN NACL 20-0.9 MG/50ML-% IV SOLN
20.0000 mg | Freq: Once | INTRAVENOUS | Status: AC
  Administered 2023-10-06: 20 mg via INTRAVENOUS
  Filled 2023-10-06: qty 50

## 2023-10-06 NOTE — Patient Instructions (Signed)

## 2023-10-07 ENCOUNTER — Other Ambulatory Visit (HOSPITAL_COMMUNITY): Payer: Self-pay

## 2023-10-08 ENCOUNTER — Inpatient Hospital Stay: Admitting: Dietician

## 2023-10-08 ENCOUNTER — Other Ambulatory Visit (HOSPITAL_COMMUNITY): Payer: Self-pay

## 2023-10-08 ENCOUNTER — Encounter: Payer: Self-pay | Admitting: Radiation Oncology

## 2023-10-08 ENCOUNTER — Other Ambulatory Visit: Payer: Self-pay | Admitting: Oncology

## 2023-10-08 ENCOUNTER — Inpatient Hospital Stay

## 2023-10-08 VITALS — BP 137/60 | HR 77 | Temp 97.2°F | Resp 18 | Ht 73.0 in | Wt 260.5 lb

## 2023-10-08 DIAGNOSIS — C090 Malignant neoplasm of tonsillar fossa: Secondary | ICD-10-CM

## 2023-10-08 DIAGNOSIS — R634 Abnormal weight loss: Secondary | ICD-10-CM | POA: Diagnosis not present

## 2023-10-08 DIAGNOSIS — B370 Candidal stomatitis: Secondary | ICD-10-CM | POA: Diagnosis not present

## 2023-10-08 DIAGNOSIS — Z923 Personal history of irradiation: Secondary | ICD-10-CM | POA: Diagnosis not present

## 2023-10-08 DIAGNOSIS — G893 Neoplasm related pain (acute) (chronic): Secondary | ICD-10-CM | POA: Diagnosis not present

## 2023-10-08 DIAGNOSIS — Z87891 Personal history of nicotine dependence: Secondary | ICD-10-CM | POA: Diagnosis not present

## 2023-10-08 DIAGNOSIS — E860 Dehydration: Secondary | ICD-10-CM | POA: Diagnosis not present

## 2023-10-08 DIAGNOSIS — Z79891 Long term (current) use of opiate analgesic: Secondary | ICD-10-CM | POA: Diagnosis not present

## 2023-10-08 DIAGNOSIS — R1310 Dysphagia, unspecified: Secondary | ICD-10-CM | POA: Diagnosis not present

## 2023-10-08 MED ORDER — HEPARIN SOD (PORK) LOCK FLUSH 100 UNIT/ML IV SOLN
500.0000 [IU] | Freq: Once | INTRAVENOUS | Status: AC
  Administered 2023-10-08: 500 [IU]

## 2023-10-08 MED ORDER — AMITRIPTYLINE HCL 25 MG PO TABS
25.0000 mg | ORAL_TABLET | Freq: Every day | ORAL | 3 refills | Status: DC
  Filled 2023-10-08: qty 30, 30d supply, fill #0

## 2023-10-08 MED ORDER — SODIUM CHLORIDE 0.9 % IV SOLN: Freq: Once | INTRAVENOUS | Status: AC

## 2023-10-08 MED ORDER — HYOSCYAMINE SULFATE 0.125 MG SL SUBL
0.2500 mg | SUBLINGUAL_TABLET | Freq: Four times a day (QID) | SUBLINGUAL | 1 refills | Status: DC | PRN
  Filled 2023-10-08: qty 60, 8d supply, fill #0

## 2023-10-08 MED ORDER — ONDANSETRON HCL 4 MG/2ML IJ SOLN
8.0000 mg | Freq: Once | INTRAMUSCULAR | Status: AC
  Administered 2023-10-08: 8 mg via INTRAVENOUS
  Filled 2023-10-08: qty 4

## 2023-10-08 MED ORDER — SODIUM CHLORIDE 0.9% FLUSH
10.0000 mL | Freq: Once | INTRAVENOUS | Status: AC
  Administered 2023-10-08: 10 mL

## 2023-10-08 MED ORDER — MORPHINE SULFATE (PF) 4 MG/ML IV SOLN
4.0000 mg | INTRAVENOUS | Status: DC | PRN
  Administered 2023-10-08: 4 mg via INTRAVENOUS
  Filled 2023-10-08: qty 1

## 2023-10-08 MED ORDER — FAMOTIDINE IN NACL 20-0.9 MG/50ML-% IV SOLN
20.0000 mg | Freq: Once | INTRAVENOUS | Status: AC
  Administered 2023-10-08: 20 mg via INTRAVENOUS
  Filled 2023-10-08: qty 50

## 2023-10-08 NOTE — Progress Notes (Signed)
 RT called to see PT for oral suctioning. PT was able to demonstrate verbal and hands on understanding of suction system (PT verified his system at home is similar to the  one used during this visit). Encouraged PT to deep breathe and cough prior to using suction system. PT Sp02 and HR were stable pre and post demonstration.

## 2023-10-08 NOTE — Patient Instructions (Signed)
 Dehydration, Adult Dehydration is a condition in which there is not enough water or other fluids in the body. This happens when a person loses more fluids than they take in. Important organs cannot work right without the right amount of fluids. Any loss of fluids from the body can cause dehydration. Dehydration can be mild, worse, or very bad. It should be treated right away to keep it from getting very bad. What are the causes? Conditions that cause loss of water in the body. They include: Watery poop (diarrhea). Vomiting. Sweating a lot. Fever. Infection. Peeing (urinating) a lot. Not drinking enough fluids. Certain medicines, such as medicines that take extra fluid out of the body (diuretics). Lack of safe drinking water. Not being able to get enough water and food. What increases the risk? Having a long-term (chronic) illness that has not been treated the right way, such as: Diabetes. Heart disease. Kidney disease. Being 25 years of age or older. Having a disability. Living in a place that is high above the ground or sea (high in altitude). The thinner, drier air causes more fluid loss. Doing exercises that put stress on your body for a long time. Being active when in hot places. What are the signs or symptoms? Symptoms of dehydration depend on how bad it is. Mild or worse dehydration Thirst. Dry lips or dry mouth. Feeling dizzy or light-headed. Muscle cramps. Passing little pee or dark pee. Pee may be the color of tea. Headache. Very bad dehydration Changes in skin. Skin may: Be cold to the touch (clammy). Be blotchy or pale. Not go back to normal right after you pinch it and let it go. Little or no tears, pee, or sweat. Fast breathing. Low blood pressure. Weak pulse. Pulse that is more than 100 beats a minute when you are sitting still. Other changes, such as: Feeling very thirsty. Eyes that look hollow (sunken). Cold hands and feet. Being confused. Being very  tired (lethargic) or having trouble waking from sleep. Losing weight. Loss of consciousness. How is this treated? Treatment for this condition depends on how bad your dehydration is. Treatment should start right away. Do not wait until your condition gets very bad. Very bad dehydration is an emergency. You will need to go to a hospital. Mild or worse dehydration can be treated at home. You may be asked to: Drink more fluids. Drink an oral rehydration solution (ORS). This drink gives you the right amount of fluids, salts, and minerals (electrolytes). Very bad dehydration can be treated: With fluids through an IV tube. By correcting low levels of electrolytes in the body. By treating the problem that caused your dehydration. Follow these instructions at home: Oral rehydration solution If told by your doctor, drink an ORS: Make an ORS. Use instructions on the package. Start by drinking small amounts, about  cup (120 mL) every 5-10 minutes. Slowly drink more until you have had the amount that your doctor said to have.  Eating and drinking  Drink enough clear fluid to keep your pee pale yellow. If you were told to drink an ORS, finish the ORS first. Then, start slowly drinking other clear fluids. Drink fluids such as: Water. Do not drink only water. Doing that can make the salt (sodium) level in your body get too low. Water from ice chips you suck on. Fruit juice that you have added water to (diluted). Low-calorie sports drinks. Eat foods that have the right amounts of salts and minerals, such as bananas, oranges, potatoes,  tomatoes, or spinach. Do not drink alcohol. Avoid drinks that have caffeine or sugar. These include:: High-calorie sports drinks. Fruit juice that you did not add water to. Soda. Coffee or energy drinks. Avoid foods that are greasy or have a lot of fat or sugar. General instructions Take over-the-counter and prescription medicines only as told by your doctor. Do  not take sodium tablets. Doing that can make the salt level in your body get too high. Return to your normal activities as told by your doctor. Ask your doctor what activities are safe for you. Keep all follow-up visits. Your doctor may check and change your treatment. Contact a doctor if: You have pain in your belly (abdomen) and the pain: Gets worse. Stays in one place. You have a rash. You have a stiff neck. You get angry or annoyed more easily than normal. You are more tired or have a harder time waking than normal. You feel weak or dizzy. You feel very thirsty. Get help right away if: You have any symptoms of very bad dehydration. You vomit every time you eat or drink. Your vomiting gets worse, does not go away, or you vomit blood or green stuff. You are getting treatment, but symptoms are getting worse. You have a fever. You have a very bad headache. You have: Diarrhea that gets worse or does not go away. Blood in your poop (stool). This may cause poop to look black and tarry. No pee in 6-8 hours. Only a small amount of pee in 6-8 hours, and the pee is very dark. You have trouble breathing. These symptoms may be an emergency. Get help right away. Call 911. Do not wait to see if the symptoms will go away. Do not drive yourself to the hospital. This information is not intended to replace advice given to you by your health care provider. Make sure you discuss any questions you have with your health care provider. Document Revised: 11/03/2021 Document Reviewed: 11/03/2021 Elsevier Patient Education  2024 [employer].

## 2023-10-11 ENCOUNTER — Inpatient Hospital Stay

## 2023-10-11 VITALS — BP 128/68 | HR 72 | Temp 98.2°F | Resp 18

## 2023-10-11 DIAGNOSIS — R1310 Dysphagia, unspecified: Secondary | ICD-10-CM | POA: Diagnosis not present

## 2023-10-11 DIAGNOSIS — R634 Abnormal weight loss: Secondary | ICD-10-CM | POA: Diagnosis not present

## 2023-10-11 DIAGNOSIS — E860 Dehydration: Secondary | ICD-10-CM | POA: Diagnosis not present

## 2023-10-11 DIAGNOSIS — B370 Candidal stomatitis: Secondary | ICD-10-CM | POA: Diagnosis not present

## 2023-10-11 DIAGNOSIS — Z79891 Long term (current) use of opiate analgesic: Secondary | ICD-10-CM | POA: Diagnosis not present

## 2023-10-11 DIAGNOSIS — Z923 Personal history of irradiation: Secondary | ICD-10-CM | POA: Diagnosis not present

## 2023-10-11 DIAGNOSIS — C090 Malignant neoplasm of tonsillar fossa: Secondary | ICD-10-CM | POA: Diagnosis not present

## 2023-10-11 DIAGNOSIS — G893 Neoplasm related pain (acute) (chronic): Secondary | ICD-10-CM | POA: Diagnosis not present

## 2023-10-11 DIAGNOSIS — Z87891 Personal history of nicotine dependence: Secondary | ICD-10-CM | POA: Diagnosis not present

## 2023-10-11 MED ORDER — MORPHINE SULFATE (PF) 4 MG/ML IV SOLN
4.0000 mg | INTRAVENOUS | Status: DC | PRN
  Administered 2023-10-11: 4 mg via INTRAVENOUS
  Filled 2023-10-11: qty 1

## 2023-10-11 MED ORDER — SODIUM CHLORIDE 0.9 % IV SOLN: Freq: Once | INTRAVENOUS | Status: AC

## 2023-10-11 NOTE — Progress Notes (Signed)
 Patient identity verified x2 This was a phone visit. Mr. Isaac Carpenter returns today for routine follow-up. He completed his treatment on 09/03/2023.    Pain issues, if any: Yes, he reports 4/10 burning sensation to tongue, mouth and throat. He states mouth.Lidocaine  does help his mouth but still having difficulty with swallowing.. Pain meds are not helping Using a feeding tube?: He reports no issues with feeding tube. Weight changes, if any:  10/06/23 -260.0 lbs 10/05/23 - 259 lbs 4 oz 10/01/23 - 258 lbs 6.4 oz Swallowing issues, if any: Yes, but is still doing speech therapy. Last appointment was 10/04/23 Smoking or chewing tobacco? Denies Using fluoride toothpaste daily?Patient is using fluoride free toothpaste. Last ENT visit was on: 05/27/23 Other notable issues, if any: His significant other [name] states that he is still hallucinating and she thinks is from Zyprexa . He states that he does not want to take Amitriptyline .

## 2023-10-11 NOTE — Patient Instructions (Signed)
 Dehydration, Adult Dehydration is a condition in which there is not enough water or other fluids in the body. This happens when a person loses more fluids than they take in. Important organs cannot work right without the right amount of fluids. Any loss of fluids from the body can cause dehydration. Dehydration can be mild, worse, or very bad. It should be treated right away to keep it from getting very bad. What are the causes? Conditions that cause loss of water in the body. They include: Watery poop (diarrhea). Vomiting. Sweating a lot. Fever. Infection. Peeing (urinating) a lot. Not drinking enough fluids. Certain medicines, such as medicines that take extra fluid out of the body (diuretics). Lack of safe drinking water. Not being able to get enough water and food. What increases the risk? Having a long-term (chronic) illness that has not been treated the right way, such as: Diabetes. Heart disease. Kidney disease. Being 25 years of age or older. Having a disability. Living in a place that is high above the ground or sea (high in altitude). The thinner, drier air causes more fluid loss. Doing exercises that put stress on your body for a long time. Being active when in hot places. What are the signs or symptoms? Symptoms of dehydration depend on how bad it is. Mild or worse dehydration Thirst. Dry lips or dry mouth. Feeling dizzy or light-headed. Muscle cramps. Passing little pee or dark pee. Pee may be the color of tea. Headache. Very bad dehydration Changes in skin. Skin may: Be cold to the touch (clammy). Be blotchy or pale. Not go back to normal right after you pinch it and let it go. Little or no tears, pee, or sweat. Fast breathing. Low blood pressure. Weak pulse. Pulse that is more than 100 beats a minute when you are sitting still. Other changes, such as: Feeling very thirsty. Eyes that look hollow (sunken). Cold hands and feet. Being confused. Being very  tired (lethargic) or having trouble waking from sleep. Losing weight. Loss of consciousness. How is this treated? Treatment for this condition depends on how bad your dehydration is. Treatment should start right away. Do not wait until your condition gets very bad. Very bad dehydration is an emergency. You will need to go to a hospital. Mild or worse dehydration can be treated at home. You may be asked to: Drink more fluids. Drink an oral rehydration solution (ORS). This drink gives you the right amount of fluids, salts, and minerals (electrolytes). Very bad dehydration can be treated: With fluids through an IV tube. By correcting low levels of electrolytes in the body. By treating the problem that caused your dehydration. Follow these instructions at home: Oral rehydration solution If told by your doctor, drink an ORS: Make an ORS. Use instructions on the package. Start by drinking small amounts, about  cup (120 mL) every 5-10 minutes. Slowly drink more until you have had the amount that your doctor said to have.  Eating and drinking  Drink enough clear fluid to keep your pee pale yellow. If you were told to drink an ORS, finish the ORS first. Then, start slowly drinking other clear fluids. Drink fluids such as: Water. Do not drink only water. Doing that can make the salt (sodium) level in your body get too low. Water from ice chips you suck on. Fruit juice that you have added water to (diluted). Low-calorie sports drinks. Eat foods that have the right amounts of salts and minerals, such as bananas, oranges, potatoes,  tomatoes, or spinach. Do not drink alcohol. Avoid drinks that have caffeine or sugar. These include:: High-calorie sports drinks. Fruit juice that you did not add water to. Soda. Coffee or energy drinks. Avoid foods that are greasy or have a lot of fat or sugar. General instructions Take over-the-counter and prescription medicines only as told by your doctor. Do  not take sodium tablets. Doing that can make the salt level in your body get too high. Return to your normal activities as told by your doctor. Ask your doctor what activities are safe for you. Keep all follow-up visits. Your doctor may check and change your treatment. Contact a doctor if: You have pain in your belly (abdomen) and the pain: Gets worse. Stays in one place. You have a rash. You have a stiff neck. You get angry or annoyed more easily than normal. You are more tired or have a harder time waking than normal. You feel weak or dizzy. You feel very thirsty. Get help right away if: You have any symptoms of very bad dehydration. You vomit every time you eat or drink. Your vomiting gets worse, does not go away, or you vomit blood or green stuff. You are getting treatment, but symptoms are getting worse. You have a fever. You have a very bad headache. You have: Diarrhea that gets worse or does not go away. Blood in your poop (stool). This may cause poop to look black and tarry. No pee in 6-8 hours. Only a small amount of pee in 6-8 hours, and the pee is very dark. You have trouble breathing. These symptoms may be an emergency. Get help right away. Call 911. Do not wait to see if the symptoms will go away. Do not drive yourself to the hospital. This information is not intended to replace advice given to you by your health care provider. Make sure you discuss any questions you have with your health care provider. Document Revised: 11/03/2021 Document Reviewed: 11/03/2021 Elsevier Patient Education  2024 [employer].

## 2023-10-12 ENCOUNTER — Ambulatory Visit
Admission: RE | Admit: 2023-10-12 | Discharge: 2023-10-12 | Disposition: A | Discharge status: Home / Self Care | Source: Ambulatory Visit | Attending: Radiology | Admitting: Radiology

## 2023-10-12 DIAGNOSIS — C090 Malignant neoplasm of tonsillar fossa: Secondary | ICD-10-CM

## 2023-10-12 NOTE — Progress Notes (Signed)
 Radiation Oncology         [phone]) [phone] ________________________________  Name: Isaac Carpenter MRN: [id number]  Date: 10/12/2023  DOB: 07/25/1950  Follow-Up Visit Note Conducted via telephone at patient request.   I spoke with the patient to conduct this consult visit via telephone. The patient was notified in advance and was offered an in person or telemedicine meeting to allow for face to face communication but instead preferred to proceed with a telephone follow-up.    CC: [name], [name], MD  [name], [name], MD  Diagnosis and Prior Radiotherapy:       ICD-10-CM   1. Malignant neoplasm of tonsillar fossa (HCC)  C09.0          Cancer Staging  Malignant neoplasm of prostate [facility]) Staging form: Prostate, AJCC 8th Edition - Clinical stage from 05/13/2022: Stage IIC (cT1c, cN0, cM0, PSA: 13.2, Grade Group: 3) - Signed by Sherwood Rise, PA-C on 07/17/2022 Histopathologic type: Adenocarcinoma, NOS Stage prefix: Initial diagnosis Prostate specific antigen (PSA) range: 10 to 19 Gleason primary pattern: 4 Gleason secondary pattern: 3 Gleason score: 7 Histologic grading system: 5 grade system Number of biopsy cores examined: 12 Number of biopsy cores positive: 4 Location of positive needle core biopsies: Both sides  Malignant neoplasm of tonsillar fossa (HCC) Staging form: Pharynx - HPV-Mediated Oropharynx, AJCC 8th Edition - Clinical stage from 06/08/2023: Stage II (cT3, cN2, cM0, p16+) - Signed by Izell Domino, MD on 06/08/2023 Stage prefix: Initial diagnosis  ==========DELIVERED PLANS==========  First Treatment Date: 2023-07-12 Last Treatment Date: 2023-09-03   Plan Name: [id number]_R_Tonsil Site: Tonsil, Right Technique: IMRT Mode: Photon Dose Per Fraction: 2 Gy Prescribed Dose (Delivered / Prescribed): 70 Gy / 70 Gy Prescribed Fxs (Delivered / Prescribed): 35 / 35  Stage II (cT3, N2, 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 He completed his treatment on 09/03/2023.   Pain issues, if any: Yes, he reports 4/10 burning sensation to tongue, mouth and throat. Lidocaine  does help his mouth but still having difficulty with swallowing. HE is taking Hycet PRN.  Using a feeding tube?: He reports no issues with feeding tube. 6 tube feedings a day.  Weight changes, if any:  N/A Swallowing issues, if any: Yes, but is still doing speech therapy. Last appointment was 10/04/23 Smoking or chewing tobacco? Denies Using fluoride toothpaste daily? Patient is using fluoride free toothpaste. Last ENT visit was on: 05/27/23 Other notable issues, if any: His significant other [name] states that he is still hallucinating and she thinks is from Zyprexa . He states that he does not want to take Amitriptyline .    ALLERGIES:  is allergic to other and testosterone.  Meds: Current Outpatient Medications  Medication Sig Dispense Refill   ALPRAZolam  (XANAX ) 1 MG tablet Take 0.5 tablets (0.5 mg total) by mouth 2 (two) times daily as needed for anxiety. 60 tablet 0   aspirin 81 MG chewable tablet Chew 81 mg by mouth daily.     doxazosin  (CARDURA ) 1 MG tablet TAKE ONE TABLET BY MOUTH AT BEDTIME 30 tablet 1   esomeprazole  (NEXIUM ) 40 MG capsule Take 1 capsule (40 mg total) as directed daily at 12 noon. See instructions to administer through Tube. 30 capsule 0   HYDROcodone -acetaminophen  (HYCET) 7.5-325 mg/15 ml solution Take 10 mLs by mouth 4 (  four) times daily as needed for moderate pain (pain score 4-6). 500 mL 0   hyoscyamine  (LEVSIN /SL) 0.125 MG SL tablet Place 2 tablets (0.25 mg total) under the tongue every 6 (six) hours as needed. 60 tablet 1   levothyroxine  (SYNTHROID ) 88 MCG tablet TAKE ONE TABLET BY MOUTH DAILY. 90 tablet 2   lidocaine  (XYLOCAINE ) 2 %  solution Patient: Mix 1part 2% viscous lidocaine , 1part water . Swish and spit 10mL of diluted mixture every 2 hours as needed. 300 mL 3   lidocaine -prilocaine  (EMLA ) cream Apply to affected area once 30 g 3   Nutritional Supplements (NUTREN 1.5) LIQD 2 cartons Nutren 1.5 (500 ml) QID via tube. Flush with 60 ml water  before/after each bolus. Provide additional 250 ml water  flush 4x/day in between feedings to meet hydration needs. Provides 3000 kcal, 136 g protein, 1528 ml free water  (3008 ml total water ) 2000 ml/day meets 100% DRI     ondansetron  (ZOFRAN ) 8 MG tablet TAKE ONE TABLET BY MOUTH EVERY 8 HOURS AS NEEDED FOR NAUSEA AND VOMITING. START ON THE THIRD DAY AFTER CISPLATIN  30 tablet 1   prochlorperazine  (COMPAZINE ) 10 MG tablet Take 1 tablet (10 mg total) by mouth every 6 (six) hours as needed (Nausea or vomiting). 30 tablet 1   solifenacin (VESICARE) 5 MG tablet Take 5 mg by mouth daily.     amitriptyline  (ELAVIL ) 25 MG tablet Take 1 tablet (25 mg total) by mouth at bedtime. (Patient not taking: Reported on 10/12/2023) 30 tablet 3   dexamethasone  (DEXAMETHASONE  INTENSOL) 1 MG/ML solution TAKE BY MOUTH OR G-TUBE ONCE DAILY FOR THREE DAYS AFTER CHEMO (Patient not taking: Reported on 10/12/2023) 30 mL 0   fentaNYL  (DURAGESIC ) 50 MCG/HR Place 1 patch onto the skin every 3 (three) days. (Patient not taking: Reported on 10/12/2023) 10 patch 0   fluconazole  (DIFLUCAN ) 40 MG/ML suspension Take 5mL today, then 2.5mL daily for 20 more days. HOLD ATORVASTATIN  WHILE ON THIS. 55 mL 0   magic mouthwash (nystatin , diphenhydrAMINE, alum & mag hydroxide) suspension mixture Take 5 mLs by mouth 3 (three) times daily as directed. (Patient not taking: Reported on 10/12/2023) 150 mL 1   metoCLOPramide  (REGLAN ) 10 MG/10ML SOLN Place 10 mLs (10 mg total) into feeding tube 3 (three) times daily before meals. (Patient not taking: Reported on 10/12/2023) 900 mL 0   metoCLOPramide  (REGLAN ) 5 MG/5ML solution PLACE INTO  FEEDING TUBE THREE TIMES DAILY BEFORE MEALS (Patient not taking: Reported on 10/12/2023) 900 mL 0   OLANZapine  (ZYPREXA ) 5 MG tablet Take 1 tablet (5 mg total) by mouth at bedtime. (Patient not taking: Reported on 10/12/2023) 30 tablet 1   ondansetron  (ZOFRAN -ODT) 8 MG disintegrating tablet Take 1 tablet (8 mg total) by mouth every 8 (eight) hours as needed for nausea or vomiting. 30 tablet 2   traZODone  (DESYREL ) 50 MG tablet Take 1 tablet (50 mg total) by mouth at bedtime. (Patient not taking: Reported on 10/12/2023) 30 tablet 1   No current facility-administered medications for this encounter.    Physical Findings:   Wt Readings from Last 3 Encounters:  10/08/23 260 lb 8 oz (118.2 kg)  10/06/23 259 lb 4 oz (117.6 kg)  10/05/23 253 lb (114.8 kg)    vitals were not taken for this visit. SABRA  Deferred, due to nature of telephone visit.    Lab Findings: Lab Results  Component Value Date   WBC 4.1 09/29/2023   HGB 9.8 (L) 09/29/2023   HCT 30.0 (L) 09/29/2023  MCV 98.0 09/29/2023   PLT 184 09/29/2023    Lab Results  Component Value Date   TSH 2.872 06/14/2023    Radiographic Findings: No results found.  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   Patient has started to try small bites of soft foods. His wife states he feels better after IV fluids. He is currently receiving fluids three times/week. Patient and his wife feel like these are helping.   2) Nutritional Status: Patient is not tolerating oral intake. Encouraged him to try this with liquids and soft foods as he starts to feel better. Regular Hycet use prior to meals should help with this. He is seeing nutrition on 10/15/2023.  Wt Readings from Last 3 Encounters:  09/21/23 256 lb 9.6 oz (116.4 kg)  09/16/23 258  lb (117 kg)  09/03/23 257 lb 12.8 oz (116.9 kg)   PEG tube: In-place, using 6 cartons/day,   3) Risk Factors: The patient has been educated about risk factors including alcohol and tobacco abuse; they understand that avoidance of alcohol and tobacco is important to prevent recurrences as well as other cancers. Patient is not smoking.   4) Swallowing: Continues to follow with SLP. He is scheduled to see Lupita on 11/02/2023.   5) Dental: Encouraged to continue regular followup with dentistry, and dental hygiene including fluoride rinses.   6) Thyroid  function: Checking annually Lab Results  Component Value Date   TSH 2.872 06/14/2023    7) Other: Mucositis/xerostomia - Patient has been encouraged to use saltwater/baking soda rinse as well as dry mouth remedies (regular sips of fluid, lozenges, biotene gel, etc.).   Pain: Patient is experiencing 4/10 pain with coughing and swallowing. Encouraged regular Hycet use to stay on top of pain, before meals and bedtime. Patient was prescribed hyoscyamine  by Dr. Autumn.   8) Telephone follow-up in 1 week. PET and office visit scheduled for late August. The patient was encouraged to call with any issues or questions before then.  This encounter was conducted via telephone.  The patient has provided two factor identification and has given verbal consent for this type of encounter and has been advised to only accept a meeting of this type in a secure network environment.  The time spent during this encounter was 20 minutes including preparation, discussion, and coordination of the patient's care  The attendants for this meeting include Leeroy Due PA-C,  patient, and his wife During the encounter Leeroy Due PA-C was located at [facility] Radiation Oncology Department.  Patient and family were located at home. _____________________________________     Leeroy Due, PA-C

## 2023-10-13 ENCOUNTER — Encounter: Payer: Self-pay | Admitting: General Practice

## 2023-10-13 ENCOUNTER — Other Ambulatory Visit: Payer: Self-pay

## 2023-10-13 ENCOUNTER — Other Ambulatory Visit (HOSPITAL_COMMUNITY): Payer: Self-pay

## 2023-10-13 ENCOUNTER — Inpatient Hospital Stay

## 2023-10-13 ENCOUNTER — Inpatient Hospital Stay (HOSPITAL_BASED_OUTPATIENT_CLINIC_OR_DEPARTMENT_OTHER): Admitting: Physician Assistant

## 2023-10-13 ENCOUNTER — Other Ambulatory Visit: Payer: Self-pay | Admitting: Radiology

## 2023-10-13 VITALS — BP 120/68 | HR 85 | Temp 98.4°F | Resp 20 | Wt 256.0 lb

## 2023-10-13 VITALS — BP 143/60 | HR 73 | Resp 18

## 2023-10-13 DIAGNOSIS — G4730 Sleep apnea, unspecified: Secondary | ICD-10-CM

## 2023-10-13 DIAGNOSIS — B370 Candidal stomatitis: Secondary | ICD-10-CM

## 2023-10-13 DIAGNOSIS — C090 Malignant neoplasm of tonsillar fossa: Secondary | ICD-10-CM

## 2023-10-13 DIAGNOSIS — E860 Dehydration: Secondary | ICD-10-CM | POA: Diagnosis not present

## 2023-10-13 DIAGNOSIS — R1310 Dysphagia, unspecified: Secondary | ICD-10-CM | POA: Diagnosis not present

## 2023-10-13 DIAGNOSIS — Z87891 Personal history of nicotine dependence: Secondary | ICD-10-CM | POA: Diagnosis not present

## 2023-10-13 DIAGNOSIS — Z923 Personal history of irradiation: Secondary | ICD-10-CM | POA: Diagnosis not present

## 2023-10-13 DIAGNOSIS — R634 Abnormal weight loss: Secondary | ICD-10-CM | POA: Diagnosis not present

## 2023-10-13 DIAGNOSIS — Z79891 Long term (current) use of opiate analgesic: Secondary | ICD-10-CM | POA: Diagnosis not present

## 2023-10-13 DIAGNOSIS — G893 Neoplasm related pain (acute) (chronic): Secondary | ICD-10-CM | POA: Diagnosis not present

## 2023-10-13 MED ORDER — SODIUM CHLORIDE 0.9% FLUSH
10.0000 mL | Freq: Once | INTRAVENOUS | Status: AC
  Administered 2023-10-13: 10 mL

## 2023-10-13 MED ORDER — FLUCONAZOLE 40 MG/ML PO SUSR: ORAL | 0 refills | Status: DC

## 2023-10-13 MED ORDER — HEPARIN SOD (PORK) LOCK FLUSH 100 UNIT/ML IV SOLN
500.0000 [IU] | Freq: Once | INTRAVENOUS | Status: AC
  Administered 2023-10-13: 500 [IU]

## 2023-10-13 MED ORDER — ONDANSETRON HCL 4 MG/2ML IJ SOLN
8.0000 mg | Freq: Once | INTRAMUSCULAR | Status: AC
  Administered 2023-10-13: 8 mg via INTRAVENOUS
  Filled 2023-10-13: qty 4

## 2023-10-13 MED ORDER — SODIUM CHLORIDE 0.9 % IV SOLN: Freq: Once | INTRAVENOUS | Status: AC

## 2023-10-13 MED ORDER — FAMOTIDINE IN NACL 20-0.9 MG/50ML-% IV SOLN
20.0000 mg | Freq: Once | INTRAVENOUS | Status: AC
  Administered 2023-10-13: 20 mg via INTRAVENOUS
  Filled 2023-10-13: qty 50

## 2023-10-13 NOTE — Progress Notes (Signed)
 Pt reported to [facility] today for IVFs. Pt had c/o feeling a catching sensation in his throat when coughing. Pt also requested to be seen by a provider today for his throat. This RN noted Pt to have apneic episodes while sleeping in [facility]. This RN made Dr. Izell aware. This RN notified respiratory therapy at [facility]. Rosina with respiratory suctioned Pt chairside in [facility].  Mallie RIGGERS in [facility] assessed Pt for thrush per Dr. Charisse request.  Dr. Izell placed referral to pulmonology for sleep apnea. This RN made Pt and spouse aware. Pt and spouse verbalized understanding and were agreeable with information provided.  This RN reached out to Chaplain Lisa Lundeen for extra support for Pt. Olam saw Pt chairside today. This RN provided Pt and spouse with information regarding support groups specifically for head/neck cancers and caregiver support.

## 2023-10-13 NOTE — Progress Notes (Signed)
 Symptom Management Consult Note [facility]    Patient Care Team: [name], [name], MD as PCP - General (Family Medicine) Vertell Pont, RN as Oncology Nurse Navigator Octavia Charleston, MD as Referring Physician (Ophthalmology) Matilda Senior, MD as Consulting Physician (Urology) Patrcia Cough, MD as Consulting Physician (Radiation Oncology) Starla Wendelyn BIRCH, RN as Registered Nurse Causey, Morna Pickle, NP as Nurse Practitioner (Hematology and Oncology) Izell Domino, MD as Attending Physician (Radiation Oncology) Okey Burns, MD as Consulting Physician (Otolaryngology) Malmfelt, Delon CROME, RN as Oncology Nurse Navigator Autumn Millman, MD as Consulting Physician (Oncology)    Name / MRN / DOB: Isaac Carpenter  [id number]  [birth year]   Date of visit: 10/13/2023   Chief Complaint/Reason for visit: mouth pain     ASSESSMENT AND PLAN Patient is a 73 y.o. male with oncologic history of malignant neoplasm of tonsillar fossa, cT3,cN2,cM0,p16+ tumor, Stage II disease followed by Dr. Autumn.  I have viewed most recent oncology note and lab work.  #Malignant neoplasm of tonsillar fossa, cT3,cN2,cM0,p16+ tumor, Stage II disease  - Finished treatment 09/09/23. Still receiving support care with IVF 3x/week   #Thrush - Exam consistent with oral thrush - Prescription sent to pharmacy for fluconazole  prescription. Patient tolerated this in the past through his tube, has not taken sine April. Patient is no longer taking atorvastatin .    Strict ED precautions discussed should symptoms worsen.   HEME/ONC HISTORY Oncology History  Malignant neoplasm of prostate (HCC)  05/13/2022 Cancer Staging   Staging form: Prostate, AJCC 8th Edition - Clinical stage from 05/13/2022: Stage IIC (cT1c, cN0, cM0, PSA: 13.2, Grade Group: 3) - Signed by Sherwood Rise, PA-C on 07/17/2022 Histopathologic type: Adenocarcinoma, NOS Stage prefix: Initial diagnosis Prostate specific antigen  (PSA) range: 10 to 19 Gleason primary pattern: 4 Gleason secondary pattern: 3 Gleason score: 7 Histologic grading system: 5 grade system Number of biopsy cores examined: 12 Number of biopsy cores positive: 4 Location of positive needle core biopsies: Both sides   07/17/2022 Initial Diagnosis   Malignant neoplasm of prostate (HCC)   Malignant neoplasm of tonsillar fossa (HCC)  06/08/2023 Initial Diagnosis   Malignant neoplasm of tonsillar fossa (HCC)   06/08/2023 Cancer Staging   Staging form: Pharynx - HPV-Mediated Oropharynx, AJCC 8th Edition - Clinical stage from 06/08/2023: Stage II (cT3, cN2, cM0, p16+) - Signed by Izell Domino, MD on 06/08/2023 Stage prefix: Initial diagnosis   07/09/2023 - 09/09/2023 Chemotherapy   Patient is on Treatment Plan : HEAD/NECK Cisplatin  (40) q7d         INTERVAL HISTORY  Discussed the use of AI scribe software for clinical note transcription with the patient, who gave verbal consent to proceed.    Isaac Carpenter is a 73 y.o. male with oncologic history as above presenting to [facility] today with chief complaint of mouth pain. Accompanied to clinic today by spouse who provides additional history.  Patient here for IVF today. He and spouse reported to RN that he continues to have pain in his mouth and throat. He describes it as a burning sensation and rates pain 4/10 in severity. He is tolerating his tube feeds. Spouse noted a change in the how his breath smells and a white coating on his tongue. She admits she has not addressed this with any providers yet. Denies any fever or chills.    ROS  All other systems are reviewed and are negative for acute change except as noted in the HPI.  Allergies  Allergen Reactions   Other Other (See Comments)    Tape used when putting in the port - caused welting   Testosterone Other (See Comments)    headache     Past Medical History:  Diagnosis Date   Arthritis    Cancer (HCC)    History of partial  thyroidectomy STATES OVERACTIVE THYROID -- NO ISSUES SINCE AGE 60 AND NO MEDS   Hyperlipidemia    Hypertension    Hypothyroidism    Sleep apnea    Per patient he tried CPAP but could not tolerate.   Thyroid  disease      Past Surgical History:  Procedure Laterality Date   CIRCUMCISION  01/04/2012   Procedure: CIRCUMCISION ADULT;  Surgeon: Arlena LILLETTE Gal, MD;  Location: [facility];  Service: Urology;  Laterality: N/A;   COLONOSCOPY  04/08/2020   Vito Cirigliano   CYSTOSCOPY  09/04/2022   Procedure: CYSTOSCOPY;  Surgeon: Matilda Senior, MD;  Location: [facility];  Service: Urology;;   IR GASTROSTOMY TUBE MOD SED  06/18/2023   IR IMAGING GUIDED PORT INSERTION  06/18/2023   RADIOACTIVE SEED IMPLANT N/A 09/04/2022   Procedure: RADIOACTIVE SEED IMPLANT/BRACHYTHERAPY IMPLANT;  Surgeon: Matilda Senior, MD;  Location: [facility];  Service: Urology;  Laterality: N/A;  90 MINS   SPACE OAR INSTILLATION N/A 09/04/2022   Procedure: SPACE OAR INSTILLATION;  Surgeon: Matilda Senior, MD;  Location: [facility];  Service: Urology;  Laterality: N/A;   THYROIDECTOMY, PARTIAL  AGE 60   OVERACTIVE THYROID     Social History   Socioeconomic History   Marital status: Married    Spouse name: Not on file   Number of children: Not on file   Years of education: Not on file   Highest education level: Not on file  Occupational History   Not on file  Tobacco Use   Smoking status: Never   Smokeless tobacco: Current    Types: Chew   Tobacco comments:    started chewing tobacco off and on since 1970  Vaping Use   Vaping status: Never Used  Substance and Sexual Activity   Alcohol use: Never   Drug use: No   Sexual activity: Not Currently  Other Topics Concern   Not on file  Social History Narrative   Not on file   Social Drivers of Health   Financial Resource Strain: Low Risk  (10/08/2021)   Overall Financial Resource  Strain (CARDIA)    Difficulty of Paying Living Expenses: Not hard at all  Food Insecurity: No Food Insecurity (08/19/2023)   Hunger Vital Sign    Worried About Running Out of Food in the Last Year: Never true    Ran Out of Food in the Last Year: Never true  Transportation Needs: No Transportation Needs (08/19/2023)   PRAPARE - Administrator, Civil Service (Medical): No    Lack of Transportation (Non-Medical): No  Physical Activity: Inactive (10/08/2021)   Exercise Vital Sign    Days of Exercise per Week: 0 days    Minutes of Exercise per Session: 0 min  Stress: No Stress Concern Present (10/08/2021)   [employer] of Occupational Health - Occupational Stress Questionnaire    Feeling of Stress : Not at all  Social Connections: Moderately Isolated (08/14/2023)   Social Connection and Isolation Panel    Frequency of Communication with Friends and Family: More than three times a week    Frequency of Social Gatherings with  Friends and Family: More than three times a week    Attends Religious Services: Never    Database administrator or Organizations: No    Attends Banker Meetings: Never    Marital Status: Married  Catering manager Violence: Not At Risk (08/19/2023)   Humiliation, Afraid, Rape, and Kick questionnaire    Fear of Current or Ex-Partner: No    Emotionally Abused: No    Physically Abused: No    Sexually Abused: No    Family History  Problem Relation Age of Onset   Cancer Mother    COPD Father    Heart attack Father    Cancer Sister    Cancer Brother    Cancer Sister    Cancer Brother    Colon cancer Neg Hx    Colon polyps Neg Hx    Esophageal cancer Neg Hx    Rectal cancer Neg Hx    Stomach cancer Neg Hx      Current Outpatient Medications:    ALPRAZolam  (XANAX ) 1 MG tablet, Take 0.5 tablets (0.5 mg total) by mouth 2 (two) times daily as needed for anxiety., Disp: 60 tablet, Rfl: 0   amitriptyline  (ELAVIL ) 25 MG tablet, Take 1 tablet (25  mg total) by mouth at bedtime. (Patient not taking: Reported on 10/12/2023), Disp: 30 tablet, Rfl: 3   aspirin 81 MG chewable tablet, Chew 81 mg by mouth daily., Disp: , Rfl:    dexamethasone  (DEXAMETHASONE  INTENSOL) 1 MG/ML solution, TAKE BY MOUTH OR G-TUBE ONCE DAILY FOR THREE DAYS AFTER CHEMO (Patient not taking: Reported on 10/12/2023), Disp: 30 mL, Rfl: 0   doxazosin  (CARDURA ) 1 MG tablet, TAKE ONE TABLET BY MOUTH AT BEDTIME, Disp: 30 tablet, Rfl: 1   esomeprazole  (NEXIUM ) 40 MG capsule, Take 1 capsule (40 mg total) as directed daily at 12 noon. See instructions to administer through Tube., Disp: 30 capsule, Rfl: 0   fentaNYL  (DURAGESIC ) 50 MCG/HR, Place 1 patch onto the skin every 3 (three) days. (Patient not taking: Reported on 10/12/2023), Disp: 10 patch, Rfl: 0   fluconazole  (DIFLUCAN ) 40 MG/ML suspension, Take 5mL today, then 2.5mL daily for 20 more days. HOLD ATORVASTATIN  WHILE ON THIS., Disp: 55 mL, Rfl: 0   HYDROcodone -acetaminophen  (HYCET) 7.5-325 mg/15 ml solution, Take 10 mLs by mouth 4 (four) times daily as needed for moderate pain (pain score 4-6)., Disp: 500 mL, Rfl: 0   hyoscyamine  (LEVSIN /SL) 0.125 MG SL tablet, Place 2 tablets (0.25 mg total) under the tongue every 6 (six) hours as needed., Disp: 60 tablet, Rfl: 1   levothyroxine  (SYNTHROID ) 88 MCG tablet, TAKE ONE TABLET BY MOUTH DAILY., Disp: 90 tablet, Rfl: 2   lidocaine  (XYLOCAINE ) 2 % solution, Patient: Mix 1part 2% viscous lidocaine , 1part water . Swish and spit 10mL of diluted mixture every 2 hours as needed., Disp: 300 mL, Rfl: 3   lidocaine -prilocaine  (EMLA ) cream, Apply to affected area once, Disp: 30 g, Rfl: 3   magic mouthwash (nystatin , diphenhydrAMINE, alum & mag hydroxide) suspension mixture, Take 5 mLs by mouth 3 (three) times daily as directed. (Patient not taking: Reported on 10/12/2023), Disp: 150 mL, Rfl: 1   metoCLOPramide  (REGLAN ) 10 MG/10ML SOLN, Place 10 mLs (10 mg total) into feeding tube 3 (three) times  daily before meals. (Patient not taking: Reported on 10/12/2023), Disp: 900 mL, Rfl: 0   metoCLOPramide  (REGLAN ) 5 MG/5ML solution, PLACE INTO FEEDING TUBE THREE TIMES DAILY BEFORE MEALS (Patient not taking: Reported on 10/12/2023), Disp: 900 mL,  Rfl: 0   Nutritional Supplements (NUTREN 1.5) LIQD, 2 cartons Nutren 1.5 (500 ml) QID via tube. Flush with 60 ml water  before/after each bolus. Provide additional 250 ml water  flush 4x/day in between feedings to meet hydration needs. Provides 3000 kcal, 136 g protein, 1528 ml free water  (3008 ml total water ) 2000 ml/day meets 100% DRI, Disp: , Rfl:    OLANZapine  (ZYPREXA ) 5 MG tablet, Take 1 tablet (5 mg total) by mouth at bedtime. (Patient not taking: Reported on 10/12/2023), Disp: 30 tablet, Rfl: 1   ondansetron  (ZOFRAN ) 8 MG tablet, TAKE ONE TABLET BY MOUTH EVERY 8 HOURS AS NEEDED FOR NAUSEA AND VOMITING. START ON THE THIRD DAY AFTER CISPLATIN , Disp: 30 tablet, Rfl: 1   ondansetron  (ZOFRAN -ODT) 8 MG disintegrating tablet, Take 1 tablet (8 mg total) by mouth every 8 (eight) hours as needed for nausea or vomiting., Disp: 30 tablet, Rfl: 2   prochlorperazine  (COMPAZINE ) 10 MG tablet, Take 1 tablet (10 mg total) by mouth every 6 (six) hours as needed (Nausea or vomiting)., Disp: 30 tablet, Rfl: 1   solifenacin (VESICARE) 5 MG tablet, Take 5 mg by mouth daily., Disp: , Rfl:    traZODone  (DESYREL ) 50 MG tablet, Take 1 tablet (50 mg total) by mouth at bedtime. (Patient not taking: Reported on 10/12/2023), Disp: 30 tablet, Rfl: 1  PHYSICAL EXAM ECOG FS:1 - Symptomatic but completely ambulatory    Vitals:   10/13/23 1049  BP: (!) 143/60  Pulse: 73  Resp: 18  SpO2: 99%   Physical Exam Vitals and nursing note reviewed.  Constitutional:      Appearance: He is not ill-appearing or toxic-appearing.  HENT:     Head: Normocephalic.     Mouth/Throat:     Mouth: Mucous membranes are dry.     Comments: Thick white coating on tongue  Eyes:      Conjunctiva/sclera: Conjunctivae normal.    Cardiovascular:     Rate and Rhythm: Normal rate.  Pulmonary:     Effort: Pulmonary effort is normal.  Abdominal:     General: There is no distension.   Musculoskeletal:     Cervical back: Normal range of motion.   Skin:    General: Skin is warm and dry.   Neurological:     Mental Status: He is alert.        LABORATORY DATA I have reviewed the data as listed    Latest Ref Rng & Units 09/29/2023   12:49 PM 08/27/2023    7:48 AM 08/20/2023    8:04 AM  CBC  WBC 4.0 - 10.5 K/uL 4.1  3.3  2.1   Hemoglobin 13.0 - 17.0 g/dL 9.8  8.9  9.5   Hematocrit 39.0 - 52.0 % 30.0  26.8  27.4   Platelets 150 - 400 K/uL 184  333  120         Latest Ref Rng & Units 09/29/2023   12:49 PM 08/27/2023    7:48 AM 08/20/2023    8:04 AM  CMP  Glucose 70 - 99 mg/dL 818  860  874   BUN 8 - 23 mg/dL 19  17  19    Creatinine 0.61 - 1.24 mg/dL 9.51  9.51  9.46   Sodium 135 - 145 mmol/L 136  136  131   Potassium 3.5 - 5.1 mmol/L 3.6  4.0  4.5   Chloride 98 - 111 mmol/L 102  97  96   CO2 22 - 32 mmol/L 30  31  30   Calcium  8.9 - 10.3 mg/dL 8.4  8.4  8.3   Total Protein 6.5 - 8.1 g/dL 5.8     Total Bilirubin 0.0 - 1.2 mg/dL 0.5     Alkaline Phos 38 - 126 U/L 78     AST 15 - 41 U/L 18     ALT 0 - 44 U/L 13          RADIOGRAPHIC STUDIES (from last 24 hours if applicable) I have personally reviewed the radiological images as listed and agreed with the findings in the report. No results found.      Visit Diagnosis: 1. Thrush, oral   2. Malignant neoplasm of tonsillar fossa (HCC)      No orders of the defined types were placed in this encounter.   All questions were answered. The patient knows to call the clinic with any problems, questions or concerns. No barriers to learning was detected.  A total of more than 30 minutes were spent on this encounter with face-to-face time and non-face-to-face time, including preparing to see the patient,  ordering tests and/or medications, counseling the patient and coordination of care as outlined above.    Thank you for allowing me to participate in the care of this patient.    Layani Foronda E  Walisiewicz, PA-C Department of Hematology/Oncology [facility] at [facility] Phone: [phone]  [phone]    10/13/2023 11:59 AM

## 2023-10-13 NOTE — Progress Notes (Signed)
 [facility] Spiritual Care Note  Followed up with Isaac Carpenter while he was getting fluids in [facility]. Talking causes such discomfort that we limited our visit, but he was appreciative of the connection and opportunity to share about the difficulties he is experiencing even since completing treatment.  Not seeing incremental improvement as one would from a cold or an injury is tough on his morale. He desperately hopes for relief from pain such that he can begin swallowing again to allow drinking/eating by mouth.   Provided compassionate presence, empathic listening, emotional support, and normalization of feelings. We plan to follow up when he is on campus again, as in-person communication is better than phone since speaking is so painful.   [address] [name], [place], [facility] Pager [phone] Voicemail [phone]

## 2023-10-13 NOTE — Patient Instructions (Signed)

## 2023-10-15 ENCOUNTER — Inpatient Hospital Stay

## 2023-10-15 ENCOUNTER — Inpatient Hospital Stay: Admitting: Dietician

## 2023-10-15 VITALS — BP 116/66 | HR 71 | Resp 18

## 2023-10-15 DIAGNOSIS — R634 Abnormal weight loss: Secondary | ICD-10-CM | POA: Diagnosis not present

## 2023-10-15 DIAGNOSIS — Z79891 Long term (current) use of opiate analgesic: Secondary | ICD-10-CM | POA: Diagnosis not present

## 2023-10-15 DIAGNOSIS — C090 Malignant neoplasm of tonsillar fossa: Secondary | ICD-10-CM

## 2023-10-15 DIAGNOSIS — R1310 Dysphagia, unspecified: Secondary | ICD-10-CM | POA: Diagnosis not present

## 2023-10-15 DIAGNOSIS — E860 Dehydration: Secondary | ICD-10-CM | POA: Diagnosis not present

## 2023-10-15 DIAGNOSIS — G893 Neoplasm related pain (acute) (chronic): Secondary | ICD-10-CM | POA: Diagnosis not present

## 2023-10-15 DIAGNOSIS — Z923 Personal history of irradiation: Secondary | ICD-10-CM | POA: Diagnosis not present

## 2023-10-15 DIAGNOSIS — Z87891 Personal history of nicotine dependence: Secondary | ICD-10-CM | POA: Diagnosis not present

## 2023-10-15 DIAGNOSIS — B370 Candidal stomatitis: Secondary | ICD-10-CM | POA: Diagnosis not present

## 2023-10-15 MED ORDER — MORPHINE SULFATE (PF) 4 MG/ML IV SOLN
4.0000 mg | INTRAVENOUS | Status: DC | PRN
  Administered 2023-10-15: 4 mg via INTRAVENOUS
  Filled 2023-10-15: qty 1

## 2023-10-15 MED ORDER — ONDANSETRON HCL 4 MG/2ML IJ SOLN
8.0000 mg | Freq: Once | INTRAMUSCULAR | Status: AC
  Administered 2023-10-15: 8 mg via INTRAVENOUS
  Filled 2023-10-15: qty 4

## 2023-10-15 MED ORDER — HEPARIN SOD (PORK) LOCK FLUSH 100 UNIT/ML IV SOLN
500.0000 [IU] | Freq: Once | INTRAVENOUS | Status: AC
  Administered 2023-10-15: 500 [IU]

## 2023-10-15 MED ORDER — SODIUM CHLORIDE 0.9% FLUSH
10.0000 mL | Freq: Once | INTRAVENOUS | Status: AC
  Administered 2023-10-15: 10 mL

## 2023-10-15 MED ORDER — SODIUM CHLORIDE 0.9 % IV SOLN: Freq: Once | INTRAVENOUS | Status: AC

## 2023-10-15 MED ORDER — FAMOTIDINE IN NACL 20-0.9 MG/50ML-% IV SOLN
20.0000 mg | Freq: Once | INTRAVENOUS | Status: AC
  Administered 2023-10-15: 20 mg via INTRAVENOUS
  Filled 2023-10-15: qty 50

## 2023-10-15 NOTE — Progress Notes (Signed)
 Nutrition Follow-up:  Pt with stage II SCC of right tonsil, p16 positive. He completed concurrent chemoradiation (first chemo 3/21; final RT 5/16). Patient is under the care of Dr. Izell and Dr. Autumn.    S/p Gtube 2/28 - replaced at [facility] with non Enfit FT 5/28 DME: [employer]  Brief nutrition follow-up completing in infusion. Pt appears ill, reports not feeling good today. Wife says he started complaining of right ear and throat pain yesterday. Pt is not using suction machine at home. Wife says he is coughing the phlegm up better. Pt hypotensive. MD made aware of symptoms and arrived in infusion to further assess pt.   Medications: reviewed   Labs: no new labs   Anthropometrics: Wt 256 lb on 6/25  6/20 - 260 lb 8 oz 6/13 - 258 lb 6.4 oz  6/3 - 256 lb 9.6 oz   NUTRITION DIAGNOSIS: Unintended wt loss - continues    INTERVENTION:  Continue Nutren 1.5 - 6 cartons via tube Continue Prosource TF20 BID  Continue supportive care with IVF per MD  MONITORING, EVALUATION, GOAL: wt trends, intake   NEXT VISIT: Wednesday July 9 during IVF

## 2023-10-18 ENCOUNTER — Telehealth: Payer: Self-pay

## 2023-10-18 ENCOUNTER — Inpatient Hospital Stay

## 2023-10-18 VITALS — BP 138/68 | HR 81 | Temp 97.7°F

## 2023-10-18 DIAGNOSIS — G4733 Obstructive sleep apnea (adult) (pediatric): Secondary | ICD-10-CM

## 2023-10-18 DIAGNOSIS — G893 Neoplasm related pain (acute) (chronic): Secondary | ICD-10-CM | POA: Diagnosis not present

## 2023-10-18 DIAGNOSIS — E860 Dehydration: Secondary | ICD-10-CM | POA: Diagnosis not present

## 2023-10-18 DIAGNOSIS — Z79891 Long term (current) use of opiate analgesic: Secondary | ICD-10-CM | POA: Diagnosis not present

## 2023-10-18 DIAGNOSIS — C090 Malignant neoplasm of tonsillar fossa: Secondary | ICD-10-CM

## 2023-10-18 DIAGNOSIS — B370 Candidal stomatitis: Secondary | ICD-10-CM | POA: Diagnosis not present

## 2023-10-18 DIAGNOSIS — R1310 Dysphagia, unspecified: Secondary | ICD-10-CM | POA: Diagnosis not present

## 2023-10-18 DIAGNOSIS — R634 Abnormal weight loss: Secondary | ICD-10-CM | POA: Diagnosis not present

## 2023-10-18 DIAGNOSIS — Z87891 Personal history of nicotine dependence: Secondary | ICD-10-CM | POA: Diagnosis not present

## 2023-10-18 DIAGNOSIS — Z923 Personal history of irradiation: Secondary | ICD-10-CM | POA: Diagnosis not present

## 2023-10-18 MED ORDER — MORPHINE SULFATE (PF) 4 MG/ML IV SOLN: 4.0000 mg | INTRAVENOUS | Status: DC | PRN

## 2023-10-18 MED ORDER — SODIUM CHLORIDE 0.9 % IV SOLN: Freq: Once | INTRAVENOUS | Status: AC

## 2023-10-18 MED ORDER — HEPARIN SOD (PORK) LOCK FLUSH 100 UNIT/ML IV SOLN
500.0000 [IU] | Freq: Once | INTRAVENOUS | Status: AC
  Administered 2023-10-18: 500 [IU]

## 2023-10-18 MED ORDER — SODIUM CHLORIDE 0.9% FLUSH
10.0000 mL | Freq: Once | INTRAVENOUS | Status: AC
  Administered 2023-10-18: 10 mL

## 2023-10-18 MED ORDER — ONDANSETRON HCL 4 MG/2ML IJ SOLN
8.0000 mg | Freq: Once | INTRAMUSCULAR | Status: AC
  Administered 2023-10-18: 8 mg via INTRAVENOUS
  Filled 2023-10-18 (×2): qty 4

## 2023-10-18 MED ORDER — FAMOTIDINE IN NACL 20-0.9 MG/50ML-% IV SOLN
20.0000 mg | Freq: Once | INTRAVENOUS | Status: AC
  Administered 2023-10-18: 20 mg via INTRAVENOUS
  Filled 2023-10-18: qty 50

## 2023-10-18 NOTE — Telephone Encounter (Signed)
 I spoke with pt's wife, pt never received the CPAP. Advised would place referral for pt to go back to Dr. Cyndi office (Pulmonology) to see if they can use the sleep study from 2022 or if it would have to be redone. Pt's wife aware they will receive a phone call to schedule.

## 2023-10-18 NOTE — Progress Notes (Signed)
 Went over home meds & encouraged to cont. Magic Mouthwash 3-4 x/d via mouth & swish & swallow or spit out if unable to swallow.  OK for diflucan  via feeding tube if unable to swallow.  OK to alternate compazine  & zofran  ODT.  Pt gets nauseated with excess, thick phlegm.  Morphine  not given today b/c pt reports it doesn't help his mouth pain & denies other pain.  Pt encouraged to call Radiation Oncology if plans to come in tomorrow instead of doing telephone visit.  Pt & wife are just frustrated.

## 2023-10-18 NOTE — Patient Instructions (Signed)
 Dehydration, Adult Dehydration is a condition in which there is not enough water or other fluids in the body. This happens when a person loses more fluids than they take in. Important organs cannot work right without the right amount of fluids. Any loss of fluids from the body can cause dehydration. Dehydration can be mild, worse, or very bad. It should be treated right away to keep it from getting very bad. What are the causes? Conditions that cause loss of water in the body. They include: Watery poop (diarrhea). Vomiting. Sweating a lot. Fever. Infection. Peeing (urinating) a lot. Not drinking enough fluids. Certain medicines, such as medicines that take extra fluid out of the body (diuretics). Lack of safe drinking water. Not being able to get enough water and food. What increases the risk? Having a long-term (chronic) illness that has not been treated the right way, such as: Diabetes. Heart disease. Kidney disease. Being 25 years of age or older. Having a disability. Living in a place that is high above the ground or sea (high in altitude). The thinner, drier air causes more fluid loss. Doing exercises that put stress on your body for a long time. Being active when in hot places. What are the signs or symptoms? Symptoms of dehydration depend on how bad it is. Mild or worse dehydration Thirst. Dry lips or dry mouth. Feeling dizzy or light-headed. Muscle cramps. Passing little pee or dark pee. Pee may be the color of tea. Headache. Very bad dehydration Changes in skin. Skin may: Be cold to the touch (clammy). Be blotchy or pale. Not go back to normal right after you pinch it and let it go. Little or no tears, pee, or sweat. Fast breathing. Low blood pressure. Weak pulse. Pulse that is more than 100 beats a minute when you are sitting still. Other changes, such as: Feeling very thirsty. Eyes that look hollow (sunken). Cold hands and feet. Being confused. Being very  tired (lethargic) or having trouble waking from sleep. Losing weight. Loss of consciousness. How is this treated? Treatment for this condition depends on how bad your dehydration is. Treatment should start right away. Do not wait until your condition gets very bad. Very bad dehydration is an emergency. You will need to go to a hospital. Mild or worse dehydration can be treated at home. You may be asked to: Drink more fluids. Drink an oral rehydration solution (ORS). This drink gives you the right amount of fluids, salts, and minerals (electrolytes). Very bad dehydration can be treated: With fluids through an IV tube. By correcting low levels of electrolytes in the body. By treating the problem that caused your dehydration. Follow these instructions at home: Oral rehydration solution If told by your doctor, drink an ORS: Make an ORS. Use instructions on the package. Start by drinking small amounts, about  cup (120 mL) every 5-10 minutes. Slowly drink more until you have had the amount that your doctor said to have.  Eating and drinking  Drink enough clear fluid to keep your pee pale yellow. If you were told to drink an ORS, finish the ORS first. Then, start slowly drinking other clear fluids. Drink fluids such as: Water. Do not drink only water. Doing that can make the salt (sodium) level in your body get too low. Water from ice chips you suck on. Fruit juice that you have added water to (diluted). Low-calorie sports drinks. Eat foods that have the right amounts of salts and minerals, such as bananas, oranges, potatoes,  tomatoes, or spinach. Do not drink alcohol. Avoid drinks that have caffeine or sugar. These include:: High-calorie sports drinks. Fruit juice that you did not add water to. Soda. Coffee or energy drinks. Avoid foods that are greasy or have a lot of fat or sugar. General instructions Take over-the-counter and prescription medicines only as told by your doctor. Do  not take sodium tablets. Doing that can make the salt level in your body get too high. Return to your normal activities as told by your doctor. Ask your doctor what activities are safe for you. Keep all follow-up visits. Your doctor may check and change your treatment. Contact a doctor if: You have pain in your belly (abdomen) and the pain: Gets worse. Stays in one place. You have a rash. You have a stiff neck. You get angry or annoyed more easily than normal. You are more tired or have a harder time waking than normal. You feel weak or dizzy. You feel very thirsty. Get help right away if: You have any symptoms of very bad dehydration. You vomit every time you eat or drink. Your vomiting gets worse, does not go away, or you vomit blood or green stuff. You are getting treatment, but symptoms are getting worse. You have a fever. You have a very bad headache. You have: Diarrhea that gets worse or does not go away. Blood in your poop (stool). This may cause poop to look black and tarry. No pee in 6-8 hours. Only a small amount of pee in 6-8 hours, and the pee is very dark. You have trouble breathing. These symptoms may be an emergency. Get help right away. Call 911. Do not wait to see if the symptoms will go away. Do not drive yourself to the hospital. This information is not intended to replace advice given to you by your health care provider. Make sure you discuss any questions you have with your health care provider. Document Revised: 11/03/2021 Document Reviewed: 11/03/2021 Elsevier Patient Education  2024 [employer].

## 2023-10-18 NOTE — Telephone Encounter (Signed)
 Copied from CRM [id number]. Topic: Referral - Question >> Oct 18, 2023  4:23 PM Lavanda D wrote: Reason for CRM: Patients wife called regarding the study from 1 year or 2 now when he had a sleep study for sleep apnea. She said he now needs the sleep apnea mask. She is wondering if Dr. [name] can use the info from the study in 2022 or if he would need a new referral with a new study. She states he has been having trouble sleeping. Please call wife with update.

## 2023-10-18 NOTE — Telephone Encounter (Signed)
 Patient called w/ fatigue/ nausea. Requests to see Dr. Izell today in [facility] clinic. I have sent this information to Dr. Izell, Leeroy Due, and Dr. Autumn. I told the patient I would return his call prior to his infusion apt at 2:15pm today w/ an update.  Patient verbalized understanding.  This concludes the interaction.  Rosaline Minerva, LPN

## 2023-10-19 ENCOUNTER — Ambulatory Visit
Admission: RE | Admit: 2023-10-19 | Discharge: 2023-10-19 | Disposition: A | Discharge status: Home / Self Care | Source: Ambulatory Visit | Attending: Radiology | Admitting: Radiology

## 2023-10-19 ENCOUNTER — Other Ambulatory Visit: Payer: Self-pay

## 2023-10-19 DIAGNOSIS — C090 Malignant neoplasm of tonsillar fossa: Secondary | ICD-10-CM

## 2023-10-19 NOTE — Progress Notes (Signed)
 Radiation Oncology         [id number]) [phone] ________________________________  Name: Isaac Carpenter MRN: [id number]  Date: 10/19/2023  DOB: 1950/04/30  Follow-Up Visit Note Conducted via telephone at patient request.   I spoke with the patient to conduct this consult visit via telephone. The patient was notified in advance and was offered an in person or telemedicine meeting to allow for face to face communication but instead preferred to proceed with a telephone follow-up.    CC: [name], [name], MD  [name], [name], MD  Diagnosis and Prior Radiotherapy:       ICD-10-CM   1. Malignant neoplasm of tonsillar fossa (HCC)  C09.0        Cancer Staging  Malignant neoplasm of prostate [facility]) Staging form: Prostate, AJCC 8th Edition - Clinical stage from 05/13/2022: Stage IIC (cT1c, cN0, cM0, PSA: 13.2, Grade Group: 3) - Signed by Sherwood Rise, PA-C on 07/17/2022 Histopathologic type: Adenocarcinoma, NOS Stage prefix: Initial diagnosis Prostate specific antigen (PSA) range: 10 to 19 Gleason primary pattern: 4 Gleason secondary pattern: 3 Gleason score: 7 Histologic grading system: 5 grade system Number of biopsy cores examined: 12 Number of biopsy cores positive: 4 Location of positive needle core biopsies: Both sides  Malignant neoplasm of tonsillar fossa (HCC) Staging form: Pharynx - HPV-Mediated Oropharynx, AJCC 8th Edition - Clinical stage from 06/08/2023: Stage II (cT3, cN2, cM0, p16+) - Signed by Izell Domino, MD on 06/08/2023 Stage prefix: Initial diagnosis  ==========DELIVERED PLANS==========  First Treatment Date: 2023-07-12 Last Treatment Date: 2023-09-03   Plan Name: [id number]_R_Tonsil Site: Tonsil, Right Technique: IMRT Mode: Photon Dose Per Fraction: 2 Gy Prescribed Dose (Delivered / Prescribed): 70 Gy / 70 Gy Prescribed Fxs (Delivered / Prescribed): 35 / 35  Stage II (cT3, N2, 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 He completed his treatment on 09/03/2023.   Pain issues, if any: Chief complaint of mouth and throat pain. He reports the lidocaine  solution and magic mouth wash helps give some relief. Using a feeding tube?: He is tolerating his tube feedings fair. She reported a couple of rough days with the coughing and the feeling of fullness. Weight changes, if any:  10/13/23 - 256.0 lbs  10/06/23 -260.0 lbs 10/05/23 - 259 lbs 4 oz Swallowing issues, if any: Patient gets nauseated with excess, thick phlegm. Encouraged to continue magic mouthwash 3-4 x/d via mouth & swish & swallow or spit out if unable to swallow.  Smoking or chewing tobacco? Denies Using fluoride toothpaste daily? Patient is using fluoride free toothpaste. Last ENT visit was on: 05/27/2023 Other notable issues, if any: The patient knows to call the clinic with any problems, questions or concerns   Secretions improving, zofran  helping with nausea Not eating orally, thrush improved minimally No SOB   ALLERGIES:  is allergic to other and testosterone.  Meds: Current Outpatient Medications  Medication Sig Dispense Refill   ALPRAZolam  (XANAX ) 1 MG tablet Take 0.5 tablets (0.5 mg total) by mouth 2 (two) times daily as needed for anxiety. 60 tablet 0   aspirin 81 MG chewable tablet Chew 81 mg by mouth daily.     doxazosin  (CARDURA ) 1 MG tablet TAKE ONE TABLET BY MOUTH AT BEDTIME 30 tablet 1   esomeprazole  (NEXIUM ) 40 MG capsule Take 1 capsule (40 mg  total) as directed daily at 12 noon. See instructions to administer through Tube. 30 capsule 0   fluconazole  (DIFLUCAN ) 40 MG/ML suspension Take 5mL today, then 2.5mL daily for 20 more days. HOLD ATORVASTATIN  WHILE ON THIS. 55 mL 0   guaiFENesin (MUCINEX FAST-MAX CHEST CONG MS) 100 MG/5ML liquid Place 20 mLs  into feeding tube every 6 (six) hours as needed for cough or to loosen phlegm.     hyoscyamine  (LEVSIN /SL) 0.125 MG SL tablet Place 2 tablets (0.25 mg total) under the tongue every 6 (six) hours as needed. 60 tablet 1   levothyroxine  (SYNTHROID ) 88 MCG tablet TAKE ONE TABLET BY MOUTH DAILY. 90 tablet 2   lidocaine  (XYLOCAINE ) 2 % solution Patient: Mix 1part 2% viscous lidocaine , 1part water . Swish and spit 10mL of diluted mixture every 2 hours as needed. 300 mL 3   lidocaine -prilocaine  (EMLA ) cream Apply to affected area once 30 g 3   magic mouthwash (nystatin , diphenhydrAMINE, alum & mag hydroxide) suspension mixture Take 5 mLs by mouth 3 (three) times daily as directed. 150 mL 1   Nutritional Supplements (NUTREN 1.5) LIQD 2 cartons Nutren 1.5 (500 ml) QID via tube. Flush with 60 ml water  before/after each bolus. Provide additional 250 ml water  flush 4x/day in between feedings to meet hydration needs. Provides 3000 kcal, 136 g protein, 1528 ml free water  (3008 ml total water ) 2000 ml/day meets 100% DRI     ondansetron  (ZOFRAN ) 8 MG tablet TAKE ONE TABLET BY MOUTH EVERY 8 HOURS AS NEEDED FOR NAUSEA AND VOMITING. START ON THE THIRD DAY AFTER CISPLATIN  30 tablet 1   ondansetron  (ZOFRAN -ODT) 8 MG disintegrating tablet Take 1 tablet (8 mg total) by mouth every 8 (eight) hours as needed for nausea or vomiting. 30 tablet 2   prochlorperazine  (COMPAZINE ) 10 MG tablet Take 1 tablet (10 mg total) by mouth every 6 (six) hours as needed (Nausea or vomiting). 30 tablet 1   solifenacin (VESICARE) 5 MG tablet Take 5 mg by mouth daily.     traZODone  (DESYREL ) 50 MG tablet Take 1 tablet (50 mg total) by mouth at bedtime. 30 tablet 1   amitriptyline  (ELAVIL ) 25 MG tablet Take 1 tablet (25 mg total) by mouth at bedtime. (Patient not taking: Reported on 10/19/2023) 30 tablet 3   dexamethasone  (DEXAMETHASONE  INTENSOL) 1 MG/ML solution TAKE BY MOUTH OR G-TUBE ONCE DAILY FOR THREE DAYS AFTER CHEMO (Patient not taking:  Reported on 10/19/2023) 30 mL 0   fentaNYL  (DURAGESIC ) 50 MCG/HR Place 1 patch onto the skin every 3 (three) days. (Patient not taking: Reported on 10/19/2023) 10 patch 0   HYDROcodone -acetaminophen  (HYCET) 7.5-325 mg/15 ml solution Take 10 mLs by mouth 4 (four) times daily as needed for moderate pain (pain score 4-6). (Patient not taking: Reported on 10/19/2023) 500 mL 0   metoCLOPramide  (REGLAN ) 10 MG/10ML SOLN Place 10 mLs (10 mg total) into feeding tube 3 (three) times daily before meals. (Patient not taking: Reported on 10/19/2023) 900 mL 0   metoCLOPramide  (REGLAN ) 5 MG/5ML solution PLACE INTO FEEDING TUBE THREE TIMES DAILY BEFORE MEALS (Patient not taking: Reported on 10/19/2023) 900 mL 0   OLANZapine  (ZYPREXA ) 5 MG tablet Take 1 tablet (5 mg total) by mouth at bedtime. (Patient not taking: Reported on 10/19/2023) 30 tablet 1   No current facility-administered medications for this encounter.   Physical Findings:   Wt Readings from Last 3 Encounters:  10/13/23 256 lb (116.1 kg)  10/08/23 260 lb 8 oz (118.2 kg)  10/06/23 259 lb 4 oz (117.6 kg)    vitals were not taken for this visit. SABRA  Deferred, due to nature of telephone visit.    Lab Findings: Lab Results  Component Value Date   WBC 4.1 09/29/2023   HGB 9.8 (L) 09/29/2023   HCT 30.0 (L) 09/29/2023   MCV 98.0 09/29/2023   PLT 184 09/29/2023    Lab Results  Component Value Date   TSH 2.872 06/14/2023    Radiographic Findings: No results found.  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   Patient has started to try small bites of soft foods. His wife states he feels better after IV fluids. He is currently receiving fluids three times/week. Patient and his wife feel like these are helping.   2) Nutritional Status:  Patient is not tolerating oral intake. Encouraged him to try this with liquids and soft foods as he starts to feel better. Regular Hycet use prior to meals should help with this. He is seeing nutrition on 10/15/2023.  Wt Readings from Last 3 Encounters:  09/21/23 256 lb 9.6 oz (116.4 kg)  09/16/23 258 lb (117 kg)  09/03/23 257 lb 12.8 oz (116.9 kg)   PEG tube: In-place, using 6 cartons/day,   3) Risk Factors: The patient has been educated about risk factors including alcohol and tobacco abuse; they understand that avoidance of alcohol and tobacco is important to prevent recurrences as well as other cancers. Patient is not smoking.   4) Swallowing: Continues to follow with SLP. He is scheduled to see Lupita on 11/02/2023.   5) Dental: Encouraged to continue regular followup with dentistry, and dental hygiene including fluoride rinses.   6) Thyroid  function: Checking annually Lab Results  Component Value Date   TSH 2.872 06/14/2023    7) Other: Mucositis/xerostomia - Patient has been encouraged to use saltwater/baking soda rinse as well as dry mouth remedies (regular sips of fluid, lozenges, biotene gel, etc.).   Pain: Patient is experiencing 4/10 pain with coughing and swallowing. Encouraged regular Hycet use to stay on top of pain, before meals and bedtime. Patient was prescribed hyoscyamine  by Dr. Autumn. Hyoscyamine  helping with secretions, feels like this is improving.   *** Liquid tylenol  since Sunday. Hycet didn't touch that Rinse with plain water  after coughing, uses lidocaine  *** Hycet kept him up at night, sleeping better not on that.  8) Telephone follow-up in 1 week. PET and office visit scheduled for late August. The patient was encouraged to call with any issues or questions before then.  This encounter was conducted via telephone.  The patient has provided two factor identification and has given verbal consent for this type of encounter and has been advised to only accept a  meeting of this type in a secure network environment.  The time spent during this encounter was 20 minutes including preparation, discussion, and coordination of the patient's care  The attendants for this meeting include Leeroy Due PA-C,  patient, and his wife During the encounter Leeroy Due PA-C was located at [facility] Radiation Oncology Department.  Patient and family were located at home. _____________________________________     Leeroy Due, PA-C

## 2023-10-19 NOTE — Progress Notes (Addendum)
 Follow-Up Visit Note Conducted via telephone at patient request.    The patient returns today for a telephone visit 1-week follow-up with Leeroy Due PA-C. He completed his treatment on 09/03/2023.   Pain issues, if any: Chief complaint: Mouth and throat pain. He reports the lidocaine  solution and magic mouth wash helps give some relief. Using a feeding tube?: He is tolerating his tube feedings fair. She reported a couple of rough days with the coughing and the feeling of fullness. Weight changes, if any:  10/13/23 - 256.0 lbs  10/06/23 -260.0 lbs 10/05/23 - 259 lbs 4 oz Swallowing issues, if jwb:Ejupzwu gets nauseated with excess, thick phlegm. Encouraged to continue magic mouthwash 3-4 x/d via mouth & swish & swallow or spit out if unable to swallow.  Smoking or chewing tobacco? Denies Using fluoride toothpaste daily? Patient is using fluoride free toothpaste. Last ENT visit was on: 05/27/2023 Other notable issues, if any: The patient knows to call the clinic with any problems, questions or concerns   Dr. Izell placed referral to pulmonology for sleep apnea. RN provided Pt and spouse with information regarding support groups specifically for head/neck cancers and caregiver support.

## 2023-10-20 ENCOUNTER — Inpatient Hospital Stay: Attending: Nurse Practitioner

## 2023-10-20 VITALS — BP 140/64 | HR 84 | Temp 98.2°F | Resp 18

## 2023-10-20 DIAGNOSIS — Z923 Personal history of irradiation: Secondary | ICD-10-CM | POA: Diagnosis not present

## 2023-10-20 DIAGNOSIS — G893 Neoplasm related pain (acute) (chronic): Secondary | ICD-10-CM | POA: Diagnosis not present

## 2023-10-20 DIAGNOSIS — C090 Malignant neoplasm of tonsillar fossa: Secondary | ICD-10-CM | POA: Diagnosis not present

## 2023-10-20 DIAGNOSIS — E860 Dehydration: Secondary | ICD-10-CM | POA: Insufficient documentation

## 2023-10-20 DIAGNOSIS — R110 Nausea: Secondary | ICD-10-CM | POA: Diagnosis not present

## 2023-10-20 DIAGNOSIS — C61 Malignant neoplasm of prostate: Secondary | ICD-10-CM | POA: Diagnosis not present

## 2023-10-20 DIAGNOSIS — R634 Abnormal weight loss: Secondary | ICD-10-CM | POA: Diagnosis not present

## 2023-10-20 DIAGNOSIS — Z87891 Personal history of nicotine dependence: Secondary | ICD-10-CM | POA: Diagnosis not present

## 2023-10-20 DIAGNOSIS — Z79891 Long term (current) use of opiate analgesic: Secondary | ICD-10-CM | POA: Insufficient documentation

## 2023-10-20 DIAGNOSIS — R1310 Dysphagia, unspecified: Secondary | ICD-10-CM | POA: Diagnosis not present

## 2023-10-20 DIAGNOSIS — Z8546 Personal history of malignant neoplasm of prostate: Secondary | ICD-10-CM | POA: Diagnosis not present

## 2023-10-20 MED ORDER — SODIUM CHLORIDE 0.9 % IV SOLN: Freq: Once | INTRAVENOUS | Status: AC

## 2023-10-20 MED ORDER — ONDANSETRON HCL 4 MG/2ML IJ SOLN
8.0000 mg | Freq: Once | INTRAMUSCULAR | Status: AC
  Administered 2023-10-20: 8 mg via INTRAVENOUS
  Filled 2023-10-20: qty 4

## 2023-10-20 NOTE — Progress Notes (Signed)
 Per Dr Autumn, ok to run fluids at 958ml/hr

## 2023-10-20 NOTE — Patient Instructions (Signed)
 Dehydration, Adult Dehydration is a condition in which there is not enough water or other fluids in the body. This happens when a person loses more fluids than they take in. Important organs cannot work right without the right amount of fluids. Any loss of fluids from the body can cause dehydration. Dehydration can be mild, worse, or very bad. It should be treated right away to keep it from getting very bad. What are the causes? Conditions that cause loss of water in the body. They include: Watery poop (diarrhea). Vomiting. Sweating a lot. Fever. Infection. Peeing (urinating) a lot. Not drinking enough fluids. Certain medicines, such as medicines that take extra fluid out of the body (diuretics). Lack of safe drinking water. Not being able to get enough water and food. What increases the risk? Having a long-term (chronic) illness that has not been treated the right way, such as: Diabetes. Heart disease. Kidney disease. Being 25 years of age or older. Having a disability. Living in a place that is high above the ground or sea (high in altitude). The thinner, drier air causes more fluid loss. Doing exercises that put stress on your body for a long time. Being active when in hot places. What are the signs or symptoms? Symptoms of dehydration depend on how bad it is. Mild or worse dehydration Thirst. Dry lips or dry mouth. Feeling dizzy or light-headed. Muscle cramps. Passing little pee or dark pee. Pee may be the color of tea. Headache. Very bad dehydration Changes in skin. Skin may: Be cold to the touch (clammy). Be blotchy or pale. Not go back to normal right after you pinch it and let it go. Little or no tears, pee, or sweat. Fast breathing. Low blood pressure. Weak pulse. Pulse that is more than 100 beats a minute when you are sitting still. Other changes, such as: Feeling very thirsty. Eyes that look hollow (sunken). Cold hands and feet. Being confused. Being very  tired (lethargic) or having trouble waking from sleep. Losing weight. Loss of consciousness. How is this treated? Treatment for this condition depends on how bad your dehydration is. Treatment should start right away. Do not wait until your condition gets very bad. Very bad dehydration is an emergency. You will need to go to a hospital. Mild or worse dehydration can be treated at home. You may be asked to: Drink more fluids. Drink an oral rehydration solution (ORS). This drink gives you the right amount of fluids, salts, and minerals (electrolytes). Very bad dehydration can be treated: With fluids through an IV tube. By correcting low levels of electrolytes in the body. By treating the problem that caused your dehydration. Follow these instructions at home: Oral rehydration solution If told by your doctor, drink an ORS: Make an ORS. Use instructions on the package. Start by drinking small amounts, about  cup (120 mL) every 5-10 minutes. Slowly drink more until you have had the amount that your doctor said to have.  Eating and drinking  Drink enough clear fluid to keep your pee pale yellow. If you were told to drink an ORS, finish the ORS first. Then, start slowly drinking other clear fluids. Drink fluids such as: Water. Do not drink only water. Doing that can make the salt (sodium) level in your body get too low. Water from ice chips you suck on. Fruit juice that you have added water to (diluted). Low-calorie sports drinks. Eat foods that have the right amounts of salts and minerals, such as bananas, oranges, potatoes,  tomatoes, or spinach. Do not drink alcohol. Avoid drinks that have caffeine or sugar. These include:: High-calorie sports drinks. Fruit juice that you did not add water to. Soda. Coffee or energy drinks. Avoid foods that are greasy or have a lot of fat or sugar. General instructions Take over-the-counter and prescription medicines only as told by your doctor. Do  not take sodium tablets. Doing that can make the salt level in your body get too high. Return to your normal activities as told by your doctor. Ask your doctor what activities are safe for you. Keep all follow-up visits. Your doctor may check and change your treatment. Contact a doctor if: You have pain in your belly (abdomen) and the pain: Gets worse. Stays in one place. You have a rash. You have a stiff neck. You get angry or annoyed more easily than normal. You are more tired or have a harder time waking than normal. You feel weak or dizzy. You feel very thirsty. Get help right away if: You have any symptoms of very bad dehydration. You vomit every time you eat or drink. Your vomiting gets worse, does not go away, or you vomit blood or green stuff. You are getting treatment, but symptoms are getting worse. You have a fever. You have a very bad headache. You have: Diarrhea that gets worse or does not go away. Blood in your poop (stool). This may cause poop to look black and tarry. No pee in 6-8 hours. Only a small amount of pee in 6-8 hours, and the pee is very dark. You have trouble breathing. These symptoms may be an emergency. Get help right away. Call 911. Do not wait to see if the symptoms will go away. Do not drive yourself to the hospital. This information is not intended to replace advice given to you by your health care provider. Make sure you discuss any questions you have with your health care provider. Document Revised: 11/03/2021 Document Reviewed: 11/03/2021 Elsevier Patient Education  2024 [employer].

## 2023-10-20 NOTE — Progress Notes (Signed)
 Pt feeling nauseous.  Zofran  PRN released from treatment plan

## 2023-10-22 DIAGNOSIS — R1310 Dysphagia, unspecified: Secondary | ICD-10-CM | POA: Diagnosis not present

## 2023-10-22 DIAGNOSIS — C090 Malignant neoplasm of tonsillar fossa: Secondary | ICD-10-CM | POA: Diagnosis not present

## 2023-10-23 ENCOUNTER — Encounter: Payer: Self-pay | Admitting: Radiation Oncology

## 2023-10-23 ENCOUNTER — Other Ambulatory Visit (HOSPITAL_COMMUNITY): Payer: Self-pay

## 2023-10-23 ENCOUNTER — Inpatient Hospital Stay

## 2023-10-23 VITALS — BP 121/77 | HR 99 | Temp 97.9°F | Resp 20

## 2023-10-23 DIAGNOSIS — Z79891 Long term (current) use of opiate analgesic: Secondary | ICD-10-CM | POA: Diagnosis not present

## 2023-10-23 DIAGNOSIS — R1310 Dysphagia, unspecified: Secondary | ICD-10-CM | POA: Diagnosis not present

## 2023-10-23 DIAGNOSIS — C090 Malignant neoplasm of tonsillar fossa: Secondary | ICD-10-CM

## 2023-10-23 DIAGNOSIS — Z87891 Personal history of nicotine dependence: Secondary | ICD-10-CM | POA: Diagnosis not present

## 2023-10-23 DIAGNOSIS — E860 Dehydration: Secondary | ICD-10-CM | POA: Diagnosis not present

## 2023-10-23 DIAGNOSIS — Z923 Personal history of irradiation: Secondary | ICD-10-CM | POA: Diagnosis not present

## 2023-10-23 DIAGNOSIS — R634 Abnormal weight loss: Secondary | ICD-10-CM | POA: Diagnosis not present

## 2023-10-23 DIAGNOSIS — G893 Neoplasm related pain (acute) (chronic): Secondary | ICD-10-CM | POA: Diagnosis not present

## 2023-10-23 DIAGNOSIS — R110 Nausea: Secondary | ICD-10-CM | POA: Diagnosis not present

## 2023-10-23 MED ORDER — SODIUM CHLORIDE 0.9 % IV SOLN: Freq: Once | INTRAVENOUS | Status: AC

## 2023-10-23 MED ORDER — SODIUM CHLORIDE 0.9% FLUSH
10.0000 mL | Freq: Once | INTRAVENOUS | Status: AC
  Administered 2023-10-23: 10 mL

## 2023-10-23 MED ORDER — HEPARIN SOD (PORK) LOCK FLUSH 100 UNIT/ML IV SOLN
500.0000 [IU] | Freq: Once | INTRAVENOUS | Status: AC
  Administered 2023-10-23: 500 [IU]

## 2023-10-23 MED ORDER — MORPHINE SULFATE (PF) 4 MG/ML IV SOLN: 4.0000 mg | INTRAVENOUS | Status: DC | PRN

## 2023-10-25 ENCOUNTER — Inpatient Hospital Stay

## 2023-10-25 ENCOUNTER — Encounter: Payer: Self-pay | Admitting: General Practice

## 2023-10-25 VITALS — BP 137/61 | HR 65 | Temp 98.1°F | Resp 20 | Wt 251.5 lb

## 2023-10-25 DIAGNOSIS — C090 Malignant neoplasm of tonsillar fossa: Secondary | ICD-10-CM | POA: Diagnosis not present

## 2023-10-25 DIAGNOSIS — R110 Nausea: Secondary | ICD-10-CM | POA: Diagnosis not present

## 2023-10-25 DIAGNOSIS — R634 Abnormal weight loss: Secondary | ICD-10-CM | POA: Diagnosis not present

## 2023-10-25 DIAGNOSIS — Z87891 Personal history of nicotine dependence: Secondary | ICD-10-CM | POA: Diagnosis not present

## 2023-10-25 DIAGNOSIS — R1310 Dysphagia, unspecified: Secondary | ICD-10-CM | POA: Diagnosis not present

## 2023-10-25 DIAGNOSIS — G893 Neoplasm related pain (acute) (chronic): Secondary | ICD-10-CM | POA: Diagnosis not present

## 2023-10-25 DIAGNOSIS — Z923 Personal history of irradiation: Secondary | ICD-10-CM | POA: Diagnosis not present

## 2023-10-25 DIAGNOSIS — E860 Dehydration: Secondary | ICD-10-CM | POA: Diagnosis not present

## 2023-10-25 DIAGNOSIS — Z79891 Long term (current) use of opiate analgesic: Secondary | ICD-10-CM | POA: Diagnosis not present

## 2023-10-25 MED ORDER — SODIUM CHLORIDE 0.9% FLUSH
10.0000 mL | Freq: Once | INTRAVENOUS | Status: AC
  Administered 2023-10-25: 10 mL

## 2023-10-25 MED ORDER — HEPARIN SOD (PORK) LOCK FLUSH 100 UNIT/ML IV SOLN
500.0000 [IU] | Freq: Once | INTRAVENOUS | Status: AC
  Administered 2023-10-25: 500 [IU]

## 2023-10-25 MED ORDER — ONDANSETRON HCL 4 MG/2ML IJ SOLN
8.0000 mg | Freq: Once | INTRAMUSCULAR | Status: AC
  Administered 2023-10-25: 8 mg via INTRAVENOUS
  Filled 2023-10-25: qty 4

## 2023-10-25 MED ORDER — SODIUM CHLORIDE 0.9 % IV SOLN: Freq: Once | INTRAVENOUS | Status: AC

## 2023-10-25 NOTE — Progress Notes (Signed)
 [facility] Spiritual Care Note  Followed up with Isaac Carpenter in infusion to offer care and encouragement. He is not up to talking much, but values being remembered and lifted up.   [address] [name], [place], [facility] Pager [phone] Voicemail [phone]

## 2023-10-25 NOTE — Patient Instructions (Signed)
 Dehydration, Adult Dehydration is a condition in which there is not enough water  or other fluids in the body. This happens when a person loses more fluids than they take in. Important organs cannot work right without the right amount of fluids. Any loss of fluids from the body can cause dehydration. Dehydration can be mild, worse, or very bad. It should be treated right away to keep it from getting very bad. What are the causes? Conditions that cause loss of water  in the body. They include: Watery poop (diarrhea). Vomiting. Sweating a lot. Fever. Infection. Peeing (urinating) a lot. Not drinking enough fluids. Certain medicines, such as medicines that take extra fluid out of the body (diuretics). Lack of safe drinking water . Not being able to get enough water  and food. What increases the risk? Having a long-term (chronic) illness that has not been treated the right way, such as: Diabetes. Heart disease. Kidney disease. Being 107 years of age or older. Having a disability. Living in a place that is high above the ground or sea (high in altitude). The thinner, drier air causes more fluid loss. Doing exercises that put stress on your body for a long time. Being active when in hot places. What are the signs or symptoms? Symptoms of dehydration depend on how bad it is. Mild or worse dehydration Thirst. Dry lips or dry mouth. Feeling dizzy or light-headed. Muscle cramps. Passing little pee or dark pee. Pee may be the color of tea. Headache. Very bad dehydration Changes in skin. Skin may: Be cold to the touch (clammy). Be blotchy or pale. Not go back to normal right after you pinch it and let it go. Little or no tears, pee, or sweat. Fast breathing. Low blood pressure. Weak pulse. Pulse that is more than 100 beats a minute when you are sitting still. Other changes, such as: Feeling very thirsty. Eyes that look hollow (sunken). Cold hands and feet. Being confused. Being very  tired (lethargic) or having trouble waking from sleep. Losing weight. Loss of consciousness. How is this treated? Treatment for this condition depends on how bad your dehydration is. Treatment should start right away. Do not wait until your condition gets very bad. Very bad dehydration is an emergency. You will need to go to a hospital. Mild or worse dehydration can be treated at home. You may be asked to: Drink more fluids. Drink an oral rehydration solution (ORS). This drink gives you the right amount of fluids, salts, and minerals (electrolytes). Very bad dehydration can be treated: With fluids through an IV tube. By correcting low levels of electrolytes in the body. By treating the problem that caused your dehydration. Follow these instructions at home: Oral rehydration solution If told by your doctor, drink an ORS: Make an ORS. Use instructions on the package. Start by drinking small amounts, about  cup (120 mL) every 5-10 minutes. Slowly drink more until you have had the amount that your doctor said to have.  Eating and drinking  Drink enough clear fluid to keep your pee pale yellow. If you were told to drink an ORS, finish the ORS first. Then, start slowly drinking other clear fluids. Drink fluids such as: Water . Do not drink only water . Doing that can make the salt (sodium) level in your body get too low. Water  from ice chips you suck on. Fruit juice that you have added water  to (diluted). Low-calorie sports drinks. Eat foods that have the right amounts of salts and minerals, such as bananas, oranges, potatoes,  tomatoes, or spinach. Do not drink alcohol. Avoid drinks that have caffeine or sugar. These include:: High-calorie sports drinks. Fruit juice that you did not add water  to. Soda. Coffee or energy drinks. Avoid foods that are greasy or have a lot of fat or sugar. General instructions Take over-the-counter and prescription medicines only as told by your doctor. Do  not take sodium tablets. Doing that can make the salt level in your body get too high. Return to your normal activities as told by your doctor. Ask your doctor what activities are safe for you. Keep all follow-up visits. Your doctor may check and change your treatment. Contact a doctor if: You have pain in your belly (abdomen) and the pain: Gets worse. Stays in one place. You have a rash. You have a stiff neck. You get angry or annoyed more easily than normal. You are more tired or have a harder time waking than normal. You feel weak or dizzy. You feel very thirsty. Get help right away if: You have any symptoms of very bad dehydration. You vomit every time you eat or drink. Your vomiting gets worse, does not go away, or you vomit blood or green stuff. You are getting treatment, but symptoms are getting worse. You have a fever. You have a very bad headache. You have: Diarrhea that gets worse or does not go away. Blood in your poop (stool). This may cause poop to look black and tarry. No pee in 6-8 hours. Only a small amount of pee in 6-8 hours, and the pee is very dark. You have trouble breathing. These symptoms may be an emergency. Get help right away. Call 911. Do not wait to see if the symptoms will go away. Do not drive yourself to the hospital. This information is not intended to replace advice given to you by your health care provider. Make sure you discuss any questions you have with your health care provider. Ondansetron  Injection What is this medication? ONDANSETRON  (on DAN se tron) prevents nausea and vomiting from chemotherapy, radiation, or surgery. It works by blocking substances in the body that may cause nausea or vomiting. It belongs to a class of medications called antiemetics. This medicine may be used for other purposes; ask your health care provider or pharmacist if you have questions. COMMON BRAND NAME(S): Zofran , Zofran  in Dextrose , Zofran  Solution What should  I tell my care team before I take this medication? They need to know if you have any of these conditions: Heart disease History of irregular heartbeat Liver disease Low levels of magnesium  or potassium in the blood An unusual or allergic reaction to ondansetron , granisetron, other medications, foods, dyes, or preservatives Pregnant or trying to get pregnant Breast-feeding How should I use this medication? This medication is injected into a vein. It is given by your care team in a hospital or clinic setting. Talk to your care team about the use of this medication in children. Special care may be needed. Overdosage: If you think you have taken too much of this medicine contact a poison control center or emergency room at once. NOTE: This medicine is only for you. Do not share this medicine with others. What if I miss a dose? This does not apply. What may interact with this medication? Do not take this medication with any of the following: Apomorphine Certain medications for fungal infections, such as fluconazole , itraconazole, ketoconazole, posaconazole, voriconazole Cisapride Dronedarone Pimozide Thioridazine This medication may also interact with the following: Carbamazepine Certain medications for depression,  anxiety, or mental health conditions Fentanyl  Linezolid MAOIs, such as Carbex, Eldepryl, Marplan, Nardil, and Parnate Methylene blue (injected into a vein) Other medications that cause heart rhythm changes, such as dofetilide, ziprasidone Phenytoin Rifampicin Tramadol This list may not describe all possible interactions. Give your health care provider a list of all the medicines, herbs, non-prescription drugs, or dietary supplements you use. Also tell them if you smoke, drink alcohol, or use illegal drugs. Some items may interact with your medicine. What should I watch for while using this medication? Your condition will be monitored carefully while you are receiving this  medication. What side effects may I notice from receiving this medication? Side effects that you should report to your care team as soon as possible: Allergic reactions--skin rash, itching, hives, swelling of the face, lips, tongue, or throat Bowel blockage--stomach cramping, unable to have a bowel movement or pass gas, loss of appetite, vomiting Chest pain (angina)--pain, pressure, or tightness in the chest, neck, back, or arms Heart rhythm changes--fast or irregular heartbeat, dizziness, feeling faint or lightheaded, chest pain, trouble breathing Irritability, confusion, fast or irregular heartbeat, muscle stiffness, twitching muscles, sweating, high fever, seizure, chills, vomiting, diarrhea, which may be signs of serotonin syndrome Side effects that usually do not require medical attention (report to your care team if they continue or are bothersome): Constipation Diarrhea General discomfort and fatigue Headache This list may not describe all possible side effects. Call your doctor for medical advice about side effects. You may report side effects to FDA at 1-800-FDA-1088. Where should I keep my medication? This medication is given in a hospital or clinic and will not be stored at home. NOTE: This sheet is a summary. It may not cover all possible information. If you have questions about this medicine, talk to your doctor, pharmacist, or health care provider.  2024 Elsevier/Gold Standard (2021-09-04 00:00:00) Document Revised: 11/03/2021 Document Reviewed: 11/03/2021 Elsevier Patient Education  2024 [employer].

## 2023-10-27 ENCOUNTER — Other Ambulatory Visit: Payer: Self-pay | Admitting: Oncology

## 2023-10-27 ENCOUNTER — Other Ambulatory Visit (HOSPITAL_COMMUNITY): Payer: Self-pay

## 2023-10-27 ENCOUNTER — Inpatient Hospital Stay

## 2023-10-27 ENCOUNTER — Inpatient Hospital Stay: Admitting: Dietician

## 2023-10-27 VITALS — BP 127/66 | HR 67 | Temp 98.1°F | Resp 18

## 2023-10-27 DIAGNOSIS — Z923 Personal history of irradiation: Secondary | ICD-10-CM | POA: Diagnosis not present

## 2023-10-27 DIAGNOSIS — C090 Malignant neoplasm of tonsillar fossa: Secondary | ICD-10-CM | POA: Diagnosis not present

## 2023-10-27 DIAGNOSIS — R634 Abnormal weight loss: Secondary | ICD-10-CM | POA: Diagnosis not present

## 2023-10-27 DIAGNOSIS — G893 Neoplasm related pain (acute) (chronic): Secondary | ICD-10-CM | POA: Diagnosis not present

## 2023-10-27 DIAGNOSIS — Z79891 Long term (current) use of opiate analgesic: Secondary | ICD-10-CM | POA: Diagnosis not present

## 2023-10-27 DIAGNOSIS — Z87891 Personal history of nicotine dependence: Secondary | ICD-10-CM | POA: Diagnosis not present

## 2023-10-27 DIAGNOSIS — R110 Nausea: Secondary | ICD-10-CM | POA: Diagnosis not present

## 2023-10-27 DIAGNOSIS — E860 Dehydration: Secondary | ICD-10-CM | POA: Diagnosis not present

## 2023-10-27 DIAGNOSIS — R1310 Dysphagia, unspecified: Secondary | ICD-10-CM | POA: Diagnosis not present

## 2023-10-27 MED ORDER — SODIUM CHLORIDE 0.9% FLUSH
10.0000 mL | Freq: Once | INTRAVENOUS | Status: AC
Start: 2023-10-27 — End: 2023-10-27
  Administered 2023-10-27: 10 mL

## 2023-10-27 MED ORDER — ONDANSETRON HCL 4 MG/2ML IJ SOLN
8.0000 mg | Freq: Once | INTRAMUSCULAR | Status: AC
  Administered 2023-10-27: 8 mg via INTRAVENOUS
  Filled 2023-10-27: qty 4

## 2023-10-27 MED ORDER — FAMOTIDINE IN NACL 20-0.9 MG/50ML-% IV SOLN
20.0000 mg | Freq: Once | INTRAVENOUS | Status: AC
  Administered 2023-10-27: 20 mg via INTRAVENOUS
  Filled 2023-10-27: qty 50

## 2023-10-27 MED ORDER — SODIUM CHLORIDE 0.9 % IV SOLN: Freq: Once | INTRAVENOUS | Status: AC

## 2023-10-27 MED ORDER — MORPHINE SULFATE (PF) 4 MG/ML IV SOLN: 4.0000 mg | INTRAVENOUS | Status: DC | PRN

## 2023-10-27 MED ORDER — HEPARIN SOD (PORK) LOCK FLUSH 100 UNIT/ML IV SOLN
500.0000 [IU] | Freq: Once | INTRAVENOUS | Status: AC
  Administered 2023-10-27: 500 [IU]

## 2023-10-27 MED ORDER — NYSTATIN 100000 UNIT/ML MT SUSP
5.0000 mL | Freq: Four times a day (QID) | OROMUCOSAL | 0 refills | Status: DC
  Filled 2023-10-27: qty 200, 10d supply, fill #0

## 2023-10-27 NOTE — Patient Instructions (Signed)
 Dehydration, Adult Dehydration is a condition in which there is not enough water or other fluids in the body. This happens when a person loses more fluids than they take in. Important organs cannot work right without the right amount of fluids. Any loss of fluids from the body can cause dehydration. Dehydration can be mild, worse, or very bad. It should be treated right away to keep it from getting very bad. What are the causes? Conditions that cause loss of water in the body. They include: Watery poop (diarrhea). Vomiting. Sweating a lot. Fever. Infection. Peeing (urinating) a lot. Not drinking enough fluids. Certain medicines, such as medicines that take extra fluid out of the body (diuretics). Lack of safe drinking water. Not being able to get enough water and food. What increases the risk? Having a long-term (chronic) illness that has not been treated the right way, such as: Diabetes. Heart disease. Kidney disease. Being 25 years of age or older. Having a disability. Living in a place that is high above the ground or sea (high in altitude). The thinner, drier air causes more fluid loss. Doing exercises that put stress on your body for a long time. Being active when in hot places. What are the signs or symptoms? Symptoms of dehydration depend on how bad it is. Mild or worse dehydration Thirst. Dry lips or dry mouth. Feeling dizzy or light-headed. Muscle cramps. Passing little pee or dark pee. Pee may be the color of tea. Headache. Very bad dehydration Changes in skin. Skin may: Be cold to the touch (clammy). Be blotchy or pale. Not go back to normal right after you pinch it and let it go. Little or no tears, pee, or sweat. Fast breathing. Low blood pressure. Weak pulse. Pulse that is more than 100 beats a minute when you are sitting still. Other changes, such as: Feeling very thirsty. Eyes that look hollow (sunken). Cold hands and feet. Being confused. Being very  tired (lethargic) or having trouble waking from sleep. Losing weight. Loss of consciousness. How is this treated? Treatment for this condition depends on how bad your dehydration is. Treatment should start right away. Do not wait until your condition gets very bad. Very bad dehydration is an emergency. You will need to go to a hospital. Mild or worse dehydration can be treated at home. You may be asked to: Drink more fluids. Drink an oral rehydration solution (ORS). This drink gives you the right amount of fluids, salts, and minerals (electrolytes). Very bad dehydration can be treated: With fluids through an IV tube. By correcting low levels of electrolytes in the body. By treating the problem that caused your dehydration. Follow these instructions at home: Oral rehydration solution If told by your doctor, drink an ORS: Make an ORS. Use instructions on the package. Start by drinking small amounts, about  cup (120 mL) every 5-10 minutes. Slowly drink more until you have had the amount that your doctor said to have.  Eating and drinking  Drink enough clear fluid to keep your pee pale yellow. If you were told to drink an ORS, finish the ORS first. Then, start slowly drinking other clear fluids. Drink fluids such as: Water. Do not drink only water. Doing that can make the salt (sodium) level in your body get too low. Water from ice chips you suck on. Fruit juice that you have added water to (diluted). Low-calorie sports drinks. Eat foods that have the right amounts of salts and minerals, such as bananas, oranges, potatoes,  tomatoes, or spinach. Do not drink alcohol. Avoid drinks that have caffeine or sugar. These include:: High-calorie sports drinks. Fruit juice that you did not add water to. Soda. Coffee or energy drinks. Avoid foods that are greasy or have a lot of fat or sugar. General instructions Take over-the-counter and prescription medicines only as told by your doctor. Do  not take sodium tablets. Doing that can make the salt level in your body get too high. Return to your normal activities as told by your doctor. Ask your doctor what activities are safe for you. Keep all follow-up visits. Your doctor may check and change your treatment. Contact a doctor if: You have pain in your belly (abdomen) and the pain: Gets worse. Stays in one place. You have a rash. You have a stiff neck. You get angry or annoyed more easily than normal. You are more tired or have a harder time waking than normal. You feel weak or dizzy. You feel very thirsty. Get help right away if: You have any symptoms of very bad dehydration. You vomit every time you eat or drink. Your vomiting gets worse, does not go away, or you vomit blood or green stuff. You are getting treatment, but symptoms are getting worse. You have a fever. You have a very bad headache. You have: Diarrhea that gets worse or does not go away. Blood in your poop (stool). This may cause poop to look black and tarry. No pee in 6-8 hours. Only a small amount of pee in 6-8 hours, and the pee is very dark. You have trouble breathing. These symptoms may be an emergency. Get help right away. Call 911. Do not wait to see if the symptoms will go away. Do not drive yourself to the hospital. This information is not intended to replace advice given to you by your health care provider. Make sure you discuss any questions you have with your health care provider. Document Revised: 11/03/2021 Document Reviewed: 11/03/2021 Elsevier Patient Education  2024 [employer].

## 2023-10-27 NOTE — Progress Notes (Signed)
 Nutrition Follow-up:  Pt with stage II SCC of right tonsil, p16 positive. He completed concurrent chemoradiation (first chemo 3/21; final RT 5/16). Patient is under the care of Dr. Izell and Dr. Autumn.    S/p Gtube 2/28 - replaced at [facility] with non Enfit FT 5/28 DME: [employer]  Met with pt and wife in infusion. He is receiving IVF today. Patient is awake, alert, and more engaging in conversation. Pt reports he is ready to eat and drink by mouth. He wants to get rid of feeding tube. Planning to get married in August. Patient endorses very painful swallow on left side which is new the last few days. This is disappointing as he was beginning to tolerate bites of foods (green beans, creamed potatoes, pudding). Pt unable to tolerate any po other than small sips of water . Dr. Autumn met with pt during visit. Pain likely related to thrush seen on oral exam. He will start nystatin  solution rinse and swallow per MD. Patient is tolerating 6 cartons Nutren 1.5. Continues Prosource TF 20 BID.   Medications: reviewed   Labs: no new labs   Anthropometrics: Wt 251 lb 8 oz on 7/7 - ongoing wt loss   6/25 - 256 lb 6/20 - 260 lb 8 oz 6/13 - 258 lb 6.4 oz 6/3 - 256 lb 9.6 oz    NUTRITION DIAGNOSIS: Unintended wt loss continues    INTERVENTION:  Continue Nutren 1.5 - 6 cartons via tube  Continue prosource TF20 BID Continue supportive care per MD Encourage frequent sips of water  + smooth solids as tolerated  Will continue assessing readiness to begin weaning from tube  Encourage daily body movement     MONITORING, EVALUATION, GOAL: wt trends, TF, oral intake    NEXT VISIT: To be scheduled

## 2023-10-29 ENCOUNTER — Inpatient Hospital Stay

## 2023-10-29 VITALS — BP 128/68 | HR 68 | Temp 97.7°F | Resp 16

## 2023-10-29 DIAGNOSIS — R110 Nausea: Secondary | ICD-10-CM | POA: Diagnosis not present

## 2023-10-29 DIAGNOSIS — C090 Malignant neoplasm of tonsillar fossa: Secondary | ICD-10-CM | POA: Diagnosis not present

## 2023-10-29 DIAGNOSIS — Z87891 Personal history of nicotine dependence: Secondary | ICD-10-CM | POA: Diagnosis not present

## 2023-10-29 DIAGNOSIS — Z79891 Long term (current) use of opiate analgesic: Secondary | ICD-10-CM | POA: Diagnosis not present

## 2023-10-29 DIAGNOSIS — E860 Dehydration: Secondary | ICD-10-CM | POA: Diagnosis not present

## 2023-10-29 DIAGNOSIS — Z923 Personal history of irradiation: Secondary | ICD-10-CM | POA: Diagnosis not present

## 2023-10-29 DIAGNOSIS — R634 Abnormal weight loss: Secondary | ICD-10-CM | POA: Diagnosis not present

## 2023-10-29 DIAGNOSIS — R1310 Dysphagia, unspecified: Secondary | ICD-10-CM | POA: Diagnosis not present

## 2023-10-29 DIAGNOSIS — G893 Neoplasm related pain (acute) (chronic): Secondary | ICD-10-CM | POA: Diagnosis not present

## 2023-10-29 MED ORDER — SODIUM CHLORIDE 0.9% FLUSH: 10.0000 mL | Freq: Once | INTRAVENOUS | Status: DC

## 2023-10-29 MED ORDER — MORPHINE SULFATE (PF) 4 MG/ML IV SOLN: 4.0000 mg | INTRAVENOUS | Status: DC | PRN

## 2023-10-29 MED ORDER — HEPARIN SOD (PORK) LOCK FLUSH 100 UNIT/ML IV SOLN
500.0000 [IU] | Freq: Once | INTRAVENOUS | Status: DC
Start: 2023-10-29 — End: 2023-10-29

## 2023-10-29 MED ORDER — SODIUM CHLORIDE 0.9 % IV SOLN: Freq: Once | INTRAVENOUS | Status: AC

## 2023-10-29 MED ORDER — FAMOTIDINE IN NACL 20-0.9 MG/50ML-% IV SOLN
20.0000 mg | Freq: Once | INTRAVENOUS | Status: AC
  Administered 2023-10-29: 20 mg via INTRAVENOUS
  Filled 2023-10-29: qty 50

## 2023-10-29 NOTE — Patient Instructions (Signed)
 Dehydration, Adult Dehydration is a condition in which there is not enough water or other fluids in the body. This happens when a person loses more fluids than they take in. Important organs cannot work right without the right amount of fluids. Any loss of fluids from the body can cause dehydration. Dehydration can be mild, worse, or very bad. It should be treated right away to keep it from getting very bad. What are the causes? Conditions that cause loss of water in the body. They include: Watery poop (diarrhea). Vomiting. Sweating a lot. Fever. Infection. Peeing (urinating) a lot. Not drinking enough fluids. Certain medicines, such as medicines that take extra fluid out of the body (diuretics). Lack of safe drinking water. Not being able to get enough water and food. What increases the risk? Having a long-term (chronic) illness that has not been treated the right way, such as: Diabetes. Heart disease. Kidney disease. Being 25 years of age or older. Having a disability. Living in a place that is high above the ground or sea (high in altitude). The thinner, drier air causes more fluid loss. Doing exercises that put stress on your body for a long time. Being active when in hot places. What are the signs or symptoms? Symptoms of dehydration depend on how bad it is. Mild or worse dehydration Thirst. Dry lips or dry mouth. Feeling dizzy or light-headed. Muscle cramps. Passing little pee or dark pee. Pee may be the color of tea. Headache. Very bad dehydration Changes in skin. Skin may: Be cold to the touch (clammy). Be blotchy or pale. Not go back to normal right after you pinch it and let it go. Little or no tears, pee, or sweat. Fast breathing. Low blood pressure. Weak pulse. Pulse that is more than 100 beats a minute when you are sitting still. Other changes, such as: Feeling very thirsty. Eyes that look hollow (sunken). Cold hands and feet. Being confused. Being very  tired (lethargic) or having trouble waking from sleep. Losing weight. Loss of consciousness. How is this treated? Treatment for this condition depends on how bad your dehydration is. Treatment should start right away. Do not wait until your condition gets very bad. Very bad dehydration is an emergency. You will need to go to a hospital. Mild or worse dehydration can be treated at home. You may be asked to: Drink more fluids. Drink an oral rehydration solution (ORS). This drink gives you the right amount of fluids, salts, and minerals (electrolytes). Very bad dehydration can be treated: With fluids through an IV tube. By correcting low levels of electrolytes in the body. By treating the problem that caused your dehydration. Follow these instructions at home: Oral rehydration solution If told by your doctor, drink an ORS: Make an ORS. Use instructions on the package. Start by drinking small amounts, about  cup (120 mL) every 5-10 minutes. Slowly drink more until you have had the amount that your doctor said to have.  Eating and drinking  Drink enough clear fluid to keep your pee pale yellow. If you were told to drink an ORS, finish the ORS first. Then, start slowly drinking other clear fluids. Drink fluids such as: Water. Do not drink only water. Doing that can make the salt (sodium) level in your body get too low. Water from ice chips you suck on. Fruit juice that you have added water to (diluted). Low-calorie sports drinks. Eat foods that have the right amounts of salts and minerals, such as bananas, oranges, potatoes,  tomatoes, or spinach. Do not drink alcohol. Avoid drinks that have caffeine or sugar. These include:: High-calorie sports drinks. Fruit juice that you did not add water to. Soda. Coffee or energy drinks. Avoid foods that are greasy or have a lot of fat or sugar. General instructions Take over-the-counter and prescription medicines only as told by your doctor. Do  not take sodium tablets. Doing that can make the salt level in your body get too high. Return to your normal activities as told by your doctor. Ask your doctor what activities are safe for you. Keep all follow-up visits. Your doctor may check and change your treatment. Contact a doctor if: You have pain in your belly (abdomen) and the pain: Gets worse. Stays in one place. You have a rash. You have a stiff neck. You get angry or annoyed more easily than normal. You are more tired or have a harder time waking than normal. You feel weak or dizzy. You feel very thirsty. Get help right away if: You have any symptoms of very bad dehydration. You vomit every time you eat or drink. Your vomiting gets worse, does not go away, or you vomit blood or green stuff. You are getting treatment, but symptoms are getting worse. You have a fever. You have a very bad headache. You have: Diarrhea that gets worse or does not go away. Blood in your poop (stool). This may cause poop to look black and tarry. No pee in 6-8 hours. Only a small amount of pee in 6-8 hours, and the pee is very dark. You have trouble breathing. These symptoms may be an emergency. Get help right away. Call 911. Do not wait to see if the symptoms will go away. Do not drive yourself to the hospital. This information is not intended to replace advice given to you by your health care provider. Make sure you discuss any questions you have with your health care provider. Document Revised: 11/03/2021 Document Reviewed: 11/03/2021 Elsevier Patient Education  2024 [employer].

## 2023-11-01 ENCOUNTER — Ambulatory Visit
Admission: RE | Admit: 2023-11-01 | Discharge: 2023-11-01 | Disposition: A | Discharge status: Home / Self Care | Source: Ambulatory Visit | Attending: Radiation Oncology | Admitting: Radiation Oncology

## 2023-11-01 ENCOUNTER — Other Ambulatory Visit: Payer: Self-pay | Admitting: Physician Assistant

## 2023-11-01 ENCOUNTER — Ambulatory Visit (HOSPITAL_BASED_OUTPATIENT_CLINIC_OR_DEPARTMENT_OTHER): Admitting: Pulmonary Disease

## 2023-11-01 ENCOUNTER — Telehealth (HOSPITAL_BASED_OUTPATIENT_CLINIC_OR_DEPARTMENT_OTHER): Payer: Self-pay | Admitting: Pulmonary Disease

## 2023-11-01 ENCOUNTER — Other Ambulatory Visit (HOSPITAL_COMMUNITY): Payer: Self-pay

## 2023-11-01 ENCOUNTER — Other Ambulatory Visit: Payer: Self-pay | Admitting: Radiation Oncology

## 2023-11-01 ENCOUNTER — Ambulatory Visit: Payer: Self-pay

## 2023-11-01 VITALS — BP 140/66 | HR 86 | Temp 97.5°F | Resp 20 | Ht 73.0 in | Wt 252.5 lb

## 2023-11-01 DIAGNOSIS — C090 Malignant neoplasm of tonsillar fossa: Secondary | ICD-10-CM | POA: Diagnosis not present

## 2023-11-01 MED ORDER — CLOTRIMAZOLE 10 MG MT TROC: OROMUCOSAL | 0 refills | Status: DC

## 2023-11-01 MED ORDER — ESOMEPRAZOLE MAGNESIUM 40 MG PO CPDR
40.0000 mg | DELAYED_RELEASE_CAPSULE | Freq: Every day | ORAL | 0 refills | Status: DC
  Filled 2023-11-01: qty 30, 30d supply, fill #0

## 2023-11-01 NOTE — Telephone Encounter (Signed)
 Noted

## 2023-11-01 NOTE — Telephone Encounter (Signed)
 FYI Only or Action Required?: FYI only for provider.  Patient was last seen in primary care on 05/17/2023 by [name], [name], MD.  Called Nurse Triage reporting Sore Throat.  Symptoms began several weeks ago.  Interventions attempted: Prescription medications: Fluconazole  and Nystatin  but pt unable to tolerate .  Symptoms are: gradually worsening.  Triage Disposition: See Physician Within 24 Hours  Patient/caregiver understands and will follow disposition?: Yes  Advised pt's wife to keep appt with Oncology tomorrow and Neuro for swallow issue since pt unable to tolerate meds prescribed for thrush and since pt sx are worse. She is asking if anyone can do referral for ENT, advised that oncology should be able to provider referral but if not call back and schedule appt with PCP. Wife verbalized understanding.    Copied from CRM [id number]. Topic: Clinical - Red Word Triage >> Nov 01, 2023 10:33 AM Lavanda D wrote: Red Word that prompted transfer to Nurse Triage: Pain on left side of throat, patient has cancer on right tonsil. Patient recently had thrush and has been treated for this but wife is unsure if the pain is related, it has been keeping him from eating.   ----------------------------------------------------------------------- From previous Reason for Contact - Scheduling: Patient/patient representative is calling to schedule an appointment. Refer to attachments for appointment information. Reason for Disposition  Diabetes mellitus or weak immune system (e.g., HIV positive, cancer chemo, splenectomy, organ transplant, chronic steroids)  Answer Assessment - Initial Assessment Questions 1. ONSET: When did the throat start hurting? (Hours or days ago)      3 weeks since last treatment in May, L side of throat hurting  2. SEVERITY: How bad is the sore throat? (Scale 1-10; mild, moderate or severe)     Moderate  5. FEVER: Do you have a fever? If Yes, ask: What is your  temperature, how was it measured, and when did it start?     no 6. PUS ON THE TONSILS: Is there pus on the tonsils in the back of your throat?     no 7. OTHER SYMPTOMS: Do you have any other symptoms? (e.g., difficulty breathing, headache, rash)     Thrush on tongue and all in mouth, on medications  Has cancer in R throat but not having pain  Protocols used: Sore Throat-A-AH

## 2023-11-01 NOTE — Progress Notes (Signed)
 Mr. Peretti presents today in the clinic    Pain issues, if any: Painful to swallow and throat feels raw and irritated. Can't eat or drink  food. Patient said he experienced left sided throat pain that has been progressively getting worse. Using a feeding tube?: Yes, using feeding tube  Weight changes, if any: Weight has stayed stable Swallowing issues, if any: Yes, patient has difficulty eating and drinking. Uses feeding tube entirely for nutrition and hydration Smoking or chewing tobacco? None Using fluoride toothpaste daily? None, fluoride burns Last ENT visit was on: N/A Other notable issues, if any:  Patient has thrush  BP (!) 140/66 (BP Location: Right Arm, Patient Position: Sitting, Cuff Size: Large)   Pulse 86   Temp (!) 97.5 F (36.4 C)   Resp 20   Ht 6' 1 (1.854 m)   Wt 252 lb 8 oz (114.5 kg)   SpO2 96%   BMI 33.31 kg/m     Wt Readings from Last 3 Encounters:  11/01/23 252 lb 8 oz (114.5 kg)  10/25/23 251 lb 8 oz (114.1 kg)  10/13/23 256 lb (116.1 kg)

## 2023-11-01 NOTE — Telephone Encounter (Signed)
 Copied from CRM [id number]. Topic: Appointments - Scheduling Inquiry for Clinic >> Nov 01, 2023  9:22 AM Russell PARAS wrote: Reason for CRM:   Pt's wife is contacting clinic due to missed appt this morning at 9 AM w/[facility] for [facility] PT. Pt is currently dealing with throat cancer and was ill this morning and not able to make the appt. Wife attempted to cancel appt last night on MyChart but was not successful.   As per protocol, we are not allowed to cancel missed appts and have been advised to leave appts on schedule. However, was not able to reschedule appt, due to it still being linked to a referral.   Requested call back to reschedule  CB#  [phone]   **Attempted to call patient and/or Spouse regarding appt and the rescheduling. No answer and VM box is full.   Appt had resulted in a NS, but I have unassigned the referral from the appt and should be available for rescheduling if they are to return our call.   Nothing further needed at this time.

## 2023-11-02 ENCOUNTER — Telehealth: Payer: Self-pay | Admitting: Oncology

## 2023-11-02 ENCOUNTER — Other Ambulatory Visit (HOSPITAL_COMMUNITY): Payer: Self-pay

## 2023-11-02 ENCOUNTER — Ambulatory Visit: Payer: Self-pay | Admitting: Radiation Oncology

## 2023-11-02 ENCOUNTER — Ambulatory Visit: Attending: Radiation Oncology

## 2023-11-02 DIAGNOSIS — R1310 Dysphagia, unspecified: Secondary | ICD-10-CM | POA: Insufficient documentation

## 2023-11-02 NOTE — Progress Notes (Signed)
 Radiation Oncology         [phone]) [phone] ________________________________  Name: Isaac Carpenter MRN: [id number]  Date: 11/01/2023  DOB: May 01, 1950  Follow-Up Visit Note Outpatient, in person  CC: Carpenter, Isaac MATSU, MD  Carpenter, Isaac G, MD  Diagnosis and Prior Radiotherapy:       ICD-10-CM   1. Malignant neoplasm of tonsillar fossa (HCC)  C09.0         Cancer Staging  Malignant neoplasm of prostate [facility]) Staging form: Prostate, AJCC 8th Edition - Clinical stage from 05/13/2022: Stage IIC (cT1c, cN0, cM0, PSA: 13.2, Grade Group: 3) - Signed by Sherwood Rise, PA-C on 07/17/2022 Histopathologic type: Adenocarcinoma, NOS Stage prefix: Initial diagnosis Prostate specific antigen (PSA) range: 10 to 19 Gleason primary pattern: 4 Gleason secondary pattern: 3 Gleason score: 7 Histologic grading system: 5 grade system Number of biopsy cores examined: 12 Number of biopsy cores positive: 4 Location of positive needle core biopsies: Both sides  Malignant neoplasm of tonsillar fossa (HCC) Staging form: Pharynx - HPV-Mediated Oropharynx, AJCC 8th Edition - Clinical stage from 06/08/2023: Stage II (cT3, cN2, cM0, p16+) - Signed by Izell Domino, MD on 06/08/2023 Stage prefix: Initial diagnosis  ==========DELIVERED PLANS==========  First Treatment Date: 2023-07-12 Last Treatment Date: 2023-09-03   Plan Name: HN_R_Tonsil Site: Tonsil, Right Technique: IMRT Mode: Photon Dose Per Fraction: 2 Gy Prescribed Dose (Delivered / Prescribed): 70 Gy / 70 Gy Prescribed Fxs (Delivered / Prescribed): 35 / 35  Stage II (cT3, N2, 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  He experiences severe pain when swallowing, described as 'like pouring alcohol on a fresh cut wound.' Swallowing water  is painful, and he can no longer consume soft foods. Hydrocodone  acetaminophen  provides no relief and causes cognitive side effects at higher doses. Lidocaine  mouthwash is difficult to swallow due to pain. Mycostatin  was initially manageable but now causes extreme pain, leading him to spit it out. He has not tried chloraseptic spray or sore throat lozenges.  He has a large tumor in his throat and received high-dose radiation therapy. Hallucinations occur as a side effect of pain medication. Liquid Tylenol  is administered through his feeding tube, and he is cautious about dosage. Hyoscyamine  is used as needed for thick saliva and phlegm, though it can cause excessive dryness.   ALLERGIES:  is allergic to other and testosterone.  Meds: Current Outpatient Medications  Medication Sig Dispense Refill   ALPRAZolam  (XANAX ) 1 MG tablet Take 0.5 tablets (0.5 mg total) by mouth 2 (two) times daily as needed for anxiety. 60 tablet 0   amitriptyline  (ELAVIL ) 25 MG tablet Take 1 tablet (25 mg total) by mouth at bedtime. (Patient not taking: Reported on 10/19/2023) 30 tablet 3   aspirin 81 MG chewable tablet Chew 81 mg by mouth daily.     clotrimazole  (MYCELEX ) 10 MG troche Suck through 1 troche 5 times daily for 14 consecutive days 70 Troche 0   dexamethasone  (DEXAMETHASONE  INTENSOL) 1 MG/ML solution TAKE BY MOUTH OR G-TUBE ONCE DAILY FOR THREE DAYS AFTER CHEMO (Patient not taking: Reported on 10/19/2023) 30 mL 0   doxazosin  (CARDURA )  1 MG tablet TAKE ONE TABLET BY MOUTH AT BEDTIME 30 tablet 1   esomeprazole  (NEXIUM ) 40 MG capsule Take 1 capsule (40 mg total) as directed daily at 12 noon. See instructions to administer through Tube. 30 capsule 0   fentaNYL  (DURAGESIC ) 50 MCG/HR Place 1 patch onto the skin every 3 (three) days.  (Patient not taking: Reported on 10/19/2023) 10 patch 0   fluconazole  (DIFLUCAN ) 40 MG/ML suspension Take 5mL today, then 2.5mL daily for 20 more days. HOLD ATORVASTATIN  WHILE ON THIS. 55 mL 0   guaiFENesin (MUCINEX FAST-MAX CHEST CONG MS) 100 MG/5ML liquid Place 20 mLs into feeding tube every 6 (six) hours as needed for cough or to loosen phlegm.     HYDROcodone -acetaminophen  (HYCET) 7.5-325 mg/15 ml solution Take 10 mLs by mouth 4 (four) times daily as needed for moderate pain (pain score 4-6). (Patient not taking: Reported on 10/19/2023) 500 mL 0   hyoscyamine  (LEVSIN /SL) 0.125 MG SL tablet Place 2 tablets (0.25 mg total) under the tongue every 6 (six) hours as needed. 60 tablet 1   levothyroxine  (SYNTHROID ) 88 MCG tablet TAKE ONE TABLET BY MOUTH DAILY. 90 tablet 2   lidocaine  (XYLOCAINE ) 2 % solution Patient: Mix 1part 2% viscous lidocaine , 1part water . Swish and spit 10mL of diluted mixture every 2 hours as needed. 300 mL 3   lidocaine -prilocaine  (EMLA ) cream Apply to affected area once 30 g 3   magic mouthwash (nystatin , diphenhydrAMINE, alum & mag hydroxide) suspension mixture Take 5 mLs by mouth 3 (three) times daily as directed. 150 mL 1   metoCLOPramide  (REGLAN ) 10 MG/10ML SOLN Place 10 mLs (10 mg total) into feeding tube 3 (three) times daily before meals. (Patient not taking: Reported on 10/19/2023) 900 mL 0   metoCLOPramide  (REGLAN ) 5 MG/5ML solution PLACE INTO FEEDING TUBE THREE TIMES DAILY BEFORE MEALS (Patient not taking: Reported on 10/19/2023) 900 mL 0   Nutritional Supplements (NUTREN 1.5) LIQD 2 cartons Nutren 1.5 (500 ml) QID via tube. Flush with 60 ml water  before/after each bolus. Provide additional 250 ml water  flush 4x/day in between feedings to meet hydration needs. Provides 3000 kcal, 136 g protein, 1528 ml free water  (3008 ml total water ) 2000 ml/day meets 100% DRI     OLANZapine  (ZYPREXA ) 5 MG tablet Take 1 tablet (5 mg total) by mouth at bedtime. (Patient not taking: Reported  on 10/19/2023) 30 tablet 1   ondansetron  (ZOFRAN ) 8 MG tablet TAKE ONE TABLET BY MOUTH EVERY 8 HOURS AS NEEDED FOR NAUSEA AND VOMITING. START ON THE THIRD DAY AFTER CISPLATIN  30 tablet 1   ondansetron  (ZOFRAN -ODT) 8 MG disintegrating tablet Take 1 tablet (8 mg total) by mouth every 8 (eight) hours as needed for nausea or vomiting. 30 tablet 2   prochlorperazine  (COMPAZINE ) 10 MG tablet Take 1 tablet (10 mg total) by mouth every 6 (six) hours as needed (Nausea or vomiting). 30 tablet 1   solifenacin (VESICARE) 5 MG tablet Take 5 mg by mouth daily.     traZODone  (DESYREL ) 50 MG tablet Take 1 tablet (50 mg total) by mouth at bedtime. 30 tablet 1   No current facility-administered medications for this encounter.   Physical Findings:   Wt Readings from Last 3 Encounters:  11/01/23 252 lb 8 oz (114.5 kg)  10/25/23 251 lb 8 oz (114.1 kg)  10/13/23 256 lb (116.1 kg)    height is 6' 1 (1.854 m) and weight is 252 lb 8 oz (114.5 kg). His temperature is 97.5 F (36.4  C) (abnormal). His blood pressure is 140/66 (abnormal) and his pulse is 86. His respiration is 20 and oxygen saturation is 96%. SABRA    HEENT: White coating on dry tongue, mucosal ulcer on uvula. Thick yellowish mucus in base of tongue and supraglottis. White patches in throat, possible debris or residual thrush. Edema of supraglottic tissue. No tumor in upper throat. NECK: No masses in neck. Mild lymphedema in anterior neck. Dry skin over neck, no peeling. CHEST: Lungs clear to auscultation bilaterally. CARDIOVASCULAR: Heart regular rate and rhythm.  PROCEDURE NOTE: After obtaining consent and spraying nasal cavity with topical lidocaine  and oxymetazoline, the flexible endoscope was coated with lidocaine  gel and introduced and passed through the nasal cavity.  The nasopharynx, oropharynx, hypopharynx, and larynx were then examined. No lesions appreciated in the mucosal axis. No tumor visualized.  Thick mucus and secretions in supraglottic  region with edema. The true cords were not fully appreciated due to edema.  The patient tolerated the procedure well.   Lab Findings: Lab Results  Component Value Date   WBC 4.1 09/29/2023   HGB 9.8 (L) 09/29/2023   HCT 30.0 (L) 09/29/2023   MCV 98.0 09/29/2023   PLT 184 09/29/2023    Lab Results  Component Value Date   TSH 2.872 06/14/2023    Radiographic Findings: No results found.  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  No evidence of disease on laryngoscopy/exam   Painful swallowing due to radiation-induced mucositis Persistent severe pain from mucosal ulcer on uvula and dry mucous membranes. No active cancer or necrotic tissue on laryngoscopy. Significant side effects from pain medication. - Prescribe sore throat lozenges with benzocaine. - Recommend chloraseptic spray if tolerated. - Continue hydrocodone  acetaminophen  as tolerated. - Administer liquid Tylenol  up to 3000 mg per day. - Encourage swallowing exercises. - Advise soft foods and thickened liquids.  Oral candidiasis White patches in throat possibly contributing to irritation and pain. Previous treatment may not have fully resolved infection. - Prescribe clotrimazole  lozenges five times a day for two weeks. - Discontinue nystatin .  Mild lymphedema of the neck Mild lymphedema likely secondary to radiation therapy. Skin dry but improving.  Tonsil cancer No evidence of active cancer on examination and laryngoscopy. Good response to radiation therapy. Continued inflammation and healing expected. - Review PET scan results in one month. - Cancel tomorrow's appointment and schedule follow-up with Ellie in two to three weeks. Patient has started to try small bites of soft foods. His wife states he feels  better after IV fluids. He is currently receiving fluids three times/week. Patient and his wife feel like these are helping.   Nutritional Status: Patient is not tolerating oral intake. Wt stable Wt Readings from Last 3 Encounters:  11/01/23 252 lb 8 oz (114.5 kg)  10/25/23 251 lb 8 oz (114.1 kg)  10/13/23 256 lb (116.1 kg)   PEG tube: In-place, using   Dental: Encouraged to continue regular followup with dentistry, and dental hygiene including fluoride rinses.   Thyroid  function: Checking annually Lab Results  Component Value Date   TSH 2.872 06/14/2023   Other: Mucositis/xerostomia - Patient has been encouraged to use saltwater/baking soda rinse as well as dry mouth remedies (regular sips of fluid, lozenges, biotene gel, etc.).  Stop regular usage of Hyoscyamine , only use if truly needed  On date of service, in total, I spent 30 minutes on this encounter. Patient was seen in person. Note signed after encounter date; minutes pertain to date of service, only.   -----------------------------------  Lauraine Golden, MD

## 2023-11-02 NOTE — Telephone Encounter (Signed)
 Confirmed 7/21 lab appointment with [name].

## 2023-11-02 NOTE — Therapy (Signed)
 OUTPATIENT SPEECH LANGUAGE PATHOLOGY ONCOLOGY TREATMENT/RECERTIFICATION   Patient Name: Isaac Carpenter MRN: [id number] DOB:March 22, 1951, 73 y.o., male Today's Date: 11/02/2023  PCP: [name], Betty, MD REFERRING PROVIDER: Izell Domino, MD  END OF SESSION:  End of Session - 11/02/23 1722     Visit Number 4    Number of Visits 7    Date for SLP Re-Evaluation 01/31/24    SLP Start Time 1106    SLP Stop Time  1146    SLP Time Calculation (min) 40 min    Activity Tolerance Patient tolerated treatment well;Patient limited by pain             Past Medical History:  Diagnosis Date   Arthritis    Cancer (HCC)    History of partial thyroidectomy STATES OVERACTIVE THYROID -- NO ISSUES SINCE AGE 81 AND NO MEDS   Hyperlipidemia    Hypertension    Hypothyroidism    Sleep apnea    Per patient he tried CPAP but could not tolerate.   Thyroid  disease    Past Surgical History:  Procedure Laterality Date   CIRCUMCISION  01/04/2012   Procedure: CIRCUMCISION ADULT;  Surgeon: Arlena LILLETTE Gal, MD;  Location: [facility];  Service: Urology;  Laterality: N/A;   COLONOSCOPY  04/08/2020   Vito Cirigliano   CYSTOSCOPY  09/04/2022   Procedure: CYSTOSCOPY;  Surgeon: Matilda Senior, MD;  Location: [facility];  Service: Urology;;   IR GASTROSTOMY TUBE MOD SED  06/18/2023   IR IMAGING GUIDED PORT INSERTION  06/18/2023   RADIOACTIVE SEED IMPLANT N/A 09/04/2022   Procedure: RADIOACTIVE SEED IMPLANT/BRACHYTHERAPY IMPLANT;  Surgeon: Matilda Senior, MD;  Location: [facility];  Service: Urology;  Laterality: N/A;  90 MINS   SPACE OAR INSTILLATION N/A 09/04/2022   Procedure: SPACE OAR INSTILLATION;  Surgeon: Matilda Senior, MD;  Location: [facility];  Service: Urology;  Laterality: N/A;   THYROIDECTOMY, PARTIAL  AGE 81   OVERACTIVE THYROID    Patient Active Problem List   Diagnosis Date Noted   Cancer (HCC) 08/14/2023   Febrile  neutropenia (HCC) 08/14/2023   S/P percutaneous endoscopic gastrostomy (PEG) tube placement (HCC) 08/14/2023   Hyponatremia 08/14/2023   Normocytic anemia 08/14/2023   Chemotherapy induced neutropenia (HCC) 07/30/2023   Fatigue 07/16/2023   Situational anxiety 07/09/2023   Encounter for antineoplastic chemotherapy 07/09/2023   Dysphagia 07/09/2023   Port-A-Cath in place 07/05/2023   Sensorineural hearing loss (SNHL) of right ear 06/23/2023   Cancer associated pain 06/11/2023   Coordination of complex care 06/11/2023   Malignant neoplasm of tonsillar fossa (HCC) 06/08/2023   Malignant neoplasm of prostate (HCC) 07/17/2022   Hyperlipidemia 05/11/2021   Morbid obesity (HCC)-BMI 37.8 with DM II,HLD,OSA,HTN 05/09/2021   Hypertension, essential, benign 07/23/2020   OSA (obstructive sleep apnea) 07/23/2020   Type 2 diabetes mellitus with other specified complication (HCC) 01/31/2020   BPH associated with nocturia 01/31/2020   Hypothyroidism, postsurgical 01/31/2020    Speech Therapy Progress Note  Dates of Reporting Period: 07/22/23 to present  Subjective Statement: PT is PEG dependent and has had difficulty with PO consumption mainly due to difficult pain management.  Objective: See below. Has had difficulty performing HEP due to pain.  Goal Update: See below.  Plan: Cont to see pt   Reason Skilled Services are Required: Pt has not yet reached max rehab potential.    ONSET DATE: see pertinent history   REFERRING DIAG: Malignant Neoplasm of tonsillar fossa  THERAPY DIAG:  Dysphagia, unspecified type  Rationale for Evaluation and Treatment: Rehabilitation  SUBJECTIVE:   SUBJECTIVE STATEMENT: 10/10. (Pain when swallowing) Pt with oral thrush, not taking meds due to burning pain with administration.   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 He presented  to his PCP on 05/17/23 with complaints of right sided jaw/maxillary pain when coughing. 05/17/23 He underwent a CT neck which revealed an ulcerated enhancing mass involving the superior oropharynx and soft palate/uvula measuring 3.4 x 3.0 x 3.6 cm in the greatest extent that is concerning for malignancy. No evidence of cervical lymphadenopathy was indicated on scan. 05/27/23 He saw Dr. Soldatova. She completed a laryngoscopy with biopsies which revealed Invasive moderately differentiated SCC, p 16 +. 06/04/23 visit with Dr. Soldatova for worsening symptoms of odynophagia and dysphagia. He was unable to maintain adequate nutrition. 06/07/23 PET revealing a right oropharyngeal tumor >4cm, likely b/l level II adenopathy, no distant metastases. Consult with Dr. Izell 06/08/23, patient reported extreme pain when swallowing that prevents him from eating or swallowing, PEG tube scheduled. 2/20 consult with Dr. Autumn. He will receive chemotherapy/radiation. Treatment plan:  He will receive 35 fractions of radiation to his right Tonsil and bilateral neck with weekly chemotherapy which started on 07/12/23 and will complete 08/27/23. Pretreatment procedures: PEG/PAC 06/18/23. MBSS 06/28/23. When he presented for consult he had severe dysphagia and PEG was placed quickly. He did recover from the dysphagia (patient thinks it was related to biopsy pain).  PAIN:  Are you having pain? Yes: NPRS scale: 7/10 Pain location: Throat Pain description: burn Aggravating factors: swallowing - 10/10 Relieving factors: meds (low dose of hydrocodone )   FALLS: Has patient fallen in last 6 months?  No  PATIENT GOALS: Maintain WNL swallowing  OBJECTIVE:  Note: Objective measures were completed at Evaluation unless otherwise noted. DIAGNOSTIC FINDINGS: See Pertinent history above  INSTRUMENTAL SWALLOW STUDY FINDINGS (MBSS) 06/28/23 Clinical Impression: Patient presents with an oropharyngeal swallow that is largely [facility]. No penetration or  aspiration occured during any phase of the swallow with any of the tested liquid or solid consistencies. Anterior hyoid excursion appeared partial in completion but patient with full epiglottic inversion and laryngeal vestibule closure. With solids, he did exhibit increased amount of vallecular residuals but with clearance with subsequent swallows and sips of liquids. PES opening appeared [facility] and no retrograde flow of barium observed in upper esophagus. Trace amount of barium residue remained in oropharynx at level of known mass, however barium fully cleared with subsequent swallows. Barium tablet transit appeared to stall in distal esophagus.    SLP recommends referral to OP SLP secondary to planned oropharyngeal chemoradiation treatment.   Recommendations/Plan: Swallowing Evaluation Recommendations PO Diet Recommendation: Regular; Thin liquids (Level 0) Liquid Administration via: Cup; Straw Medication Administration: Whole meds with liquid Supervision: Patient able to self-feed Swallowing strategies  : Follow solids with liquids Postural changes: Position pt fully upright for meals Oral care recommendations: Oral care BID (2x/day)  TREATMENT DATE:   11/02/23: Yesterday Dr. Izell prescribed Chloraseptic lozenges as well as spray. Pt and SLO to pick up ASAP. [name] stated Dr. Izell told pt to discharge med for thrush.  Pt c/o pain as above but was willing to participate with SLP today. He has not completed HEP due to pain, minimal POs since ~ two weeks ago when thrush began. Prior to this was doing more liquids during the day. Pt with occasional coughing prior to POs. Pt took four 1/3 teaspoons water  today with delayed cough (15-30 seconds later) with 3/4 of them. Pt stated cough due to tickle and was unsure if a wrong pipe situation was felt with each of these. SLP suspects  pt not swallowing with full ROM due to anticipation of pain increasing to 10/10. No overt s/sx aspiration PNA but SLP gave these to pt today with explanation if having these s/sx discontinue all PO asap and inform/notify MDs. Recommended pt have at least 10 ice chips at least 8 times/day to inhibit muscle disuse atrophy, encourage swallowing musculature. SLP told pt to perform HEP reps as he is able and provided rationale. SLP does not think MBS necessary at this time as pt's pain would skew results (suspect pt is not using full ROM), but would recommend when pt's pain is better managed, or pt has a more immediate cough response to POs, or pt has s/sx aspiration PNA.   10/04/23: Pt with rough voice today. [name] reports he was very groggy and verbally hallucinating last night on Fentanyl  patch. She removed the patch and is picking up a lower dose today after pt obtains fluids at [facility] He has had minimal PO daily. Today pt had multiple teaspoons of water  without overt s/sx aspiration. When he took one of three cup sips, pt had violent coughing. SLP told pt to have multiple teaspoons water  (preferably warm), 8-10 times a day and explained rationale for this. SLP ascertained through having pt take different SIZE TEASPOON SIPS that pt's odynophagia is ASSOCIATED WITH SIP SIZE. SLP told pt to have teaspoon sips and pain did not incr greater than 3/10. With HEP, pt has not been completing as directed. SLP again reinforced rationale for HEP and pt's performance was WNL with rare min A for tongue protrusion. SLP told pt to perform with drops of water  as he needs. Breathing sounded slightly labored today but after pt coughed, breathing was not sounding as labored. Sixty seconds later pt's breathing became more labored again, but not as labored as originally. [name] reports that when he coughs and frees phlegm, it is hardened. SLP strongly suggested pt drink teaspoons of warm water  to more easily free dried phlegm (assumed)  from pharynx. Wife is running humidifiers in the home.   09/02/23: Pt c/o pain in throat 10/10. Is not doing any reps of HEP currently, and has not done any since last ST session. Pt told SLP rationale of HEP with independence. Pt took 1/2-3/4 teaspoons water  today without s/sx oral, or overt s/sx pharyngeal deficits. SLP encouraged pt to have at least 2-3 teaspoons of water  at least 2-3 times/day and increase this as pt is able, up to small sips of water  multiple times per day. SLP reminded pt about muscle disuse atrophy and that this was possible due to the time pt has not had anything PO. SLP educated pt and SO about food journal.  SLP demonstrated each exercise of HEP (Except Shaker) with pt and SO, and described a few examples of how  pt could add HEP back into his daily routine, with goal being at least 20 reps of effortful, Masako, and Mendelsohn per day. SLP reiterated pt could perform multiple reps of Shaker without the need to swallow and encouraged pt to perform this exercise 9-12 times/day starting today.  07/22/23: Research states the risk for dysphagia increases due to radiation and/or chemotherapy treatment due to a variety of factors, so SLP educated the pt about the possibility of reduced/limited ability for PO intake during rad tx. SLP also educated pt regarding possible changes to swallowing musculature after rad tx, and why adherence to dysphagia HEP provided today and PO consumption was necessary to inhibit muscle fibrosis following rad tx and to mitigate muscle disuse atrophy. SLP informed pt why this would be detrimental to their swallowing status and to their pulmonary health. Pt demonstrated understanding of these things to SLP. SLP encouraged pt to safely eat and drink as deep into their radiation/chemotherapy as possible to provide the best possible long-term swallowing outcome for pt.  SLP then developed an individualized HEP for pt involving oral and pharyngeal strengthening and ROM  and pt was instructed how to perform these exercises, including SLP demonstration. After SLP demonstration, pt return demonstrated each exercise. SLP ensured pt performance was correct prior to educating pt on next exercise. Pt required min-mod cues faded to modified independent to perform HEP. Pt was instructed to complete this program 5-7 days/week, at least 20 reps a day until 6 months after his last day of rad tx, and then x2/week after that, indefinitely. Among other modifications for days when pt cannot functionally swallow, SLP also suggested pt to perform only non-swallowing tasks on the handout/HEP, and if necessary to cycle through the swallowing portion so the full program of exercises can be completed instead of fatiguing on one of the swallowing exercises and being unable to perform the other swallowing exercises. SLP instructed that swallowing exercises should then be added back into the regimen as pt is able to do so.   PATIENT EDUCATION: Education details: late effects head/neck radiation on swallow function, HEP procedure, and modification to HEP when difficulty experienced with swallowing during and after radiation course Person educated: Patient and Caregiver [name] Education method: Explanation, Demonstration, Verbal cues, and Handouts Education comprehension: verbalized understanding, returned demonstration, verbal cues required, and needs further education   ASSESSMENT:  CLINICAL IMPRESSION: RECERT TODAY. Patient is a 73 y.o. M who was seen today for treatment of swallowing after radiation/chemoradiation therapy. See treatment date for details on today's session. There are no overt s/s aspiration PNA observed by SLP nor any reported by pt at this time. PT continues PEG dependent and is having difficulty with POs mostly due to difficult management of pain. Data indicate that pt's swallow ability could very well decline over time following the conclusion of rad therapy due to muscle  disuse atrophy and/or muscle fibrosis. Pt will cont to need to be seen by SLP in order to assess safety of PO intake, assess the need for recommending any objective swallow assessment, and ensuring pt is correctly completing the individualized HEP.  OBJECTIVE IMPAIRMENTS: include dysphagia. These impairments are limiting patient from safety when swallowing. Factors affecting potential to achieve goals and functional outcome are none noted today. Patient will benefit from skilled SLP services to address above impairments and improve overall function.  REHAB POTENTIAL: Good   GOALS: Goals reviewed with patient? No  SHORT TERM GOALS: Target: 3rd total session   1. Pt will compelte  HEP with modified independence in 2 sessions Baseline: Goal status: not met   2.  pt will tell SLP why pt is completing HEP with modified independence Baseline:  Goal status: Met   3.  pt will describe 3 overt s/s aspiration PNA with modified independence Baseline:  Goal status: moved to LTG   4.  pt will tell SLP how a food journal could hasten return to a more normalized diet Baseline:  Goal status: Met     LONG TERM GOALS: Target: 7th total session   1.  pt will complete HEP with independence over two visits Baseline:  Goal status: INITIAL   2.  pt will describe how to modify HEP over time, and the timeline associated with reduction in HEP frequency with modified independence over two sessions Baseline:  Goal status: INITIAL  3.  pt will describe 3 overt s/s aspiration PNA with modified independence Baseline:  Goal status: met     PLAN:   SLP FREQUENCY:  once approx every 4 weeks   SLP DURATION:  7 sessions   PLANNED INTERVENTIONS: Aspiration precaution training, Pharyngeal strengthening exercises, Diet toleration management , Trials of upgraded texture/liquids, SLP instruction and feedback, Compensatory strategies, and Patient/family education, [phone] (treatment of swallowing dysfunction  and/or oral function for feeding)   Chanique Duca, CCC-SLP 11/02/2023, 5:22 PM

## 2023-11-02 NOTE — Patient Instructions (Signed)
   Signs of Aspiration Pneumonia   Chest pain/tightness Fever (can be low grade) Cough  With foul-smelling phlegm (sputum) With sputum containing pus or blood With greenish sputum Fatigue  Shortness of breath  Wheezing   **IF YOU HAVE THESE SIGNS, CONTACT YOUR DOCTOR OR GO TO THE EMERGENCY DEPARTMENT OR URGENT CARE AS SOON AS POSSIBLE**

## 2023-11-08 ENCOUNTER — Inpatient Hospital Stay

## 2023-11-08 ENCOUNTER — Other Ambulatory Visit: Payer: Self-pay

## 2023-11-08 DIAGNOSIS — D649 Anemia, unspecified: Secondary | ICD-10-CM

## 2023-11-08 DIAGNOSIS — C090 Malignant neoplasm of tonsillar fossa: Secondary | ICD-10-CM

## 2023-11-08 DIAGNOSIS — R634 Abnormal weight loss: Secondary | ICD-10-CM | POA: Diagnosis not present

## 2023-11-08 DIAGNOSIS — Z87891 Personal history of nicotine dependence: Secondary | ICD-10-CM | POA: Diagnosis not present

## 2023-11-08 DIAGNOSIS — E860 Dehydration: Secondary | ICD-10-CM | POA: Diagnosis not present

## 2023-11-08 DIAGNOSIS — Z79891 Long term (current) use of opiate analgesic: Secondary | ICD-10-CM | POA: Diagnosis not present

## 2023-11-08 DIAGNOSIS — C61 Malignant neoplasm of prostate: Secondary | ICD-10-CM

## 2023-11-08 DIAGNOSIS — G893 Neoplasm related pain (acute) (chronic): Secondary | ICD-10-CM | POA: Diagnosis not present

## 2023-11-08 DIAGNOSIS — R110 Nausea: Secondary | ICD-10-CM | POA: Diagnosis not present

## 2023-11-08 DIAGNOSIS — R1310 Dysphagia, unspecified: Secondary | ICD-10-CM | POA: Diagnosis not present

## 2023-11-08 DIAGNOSIS — Z923 Personal history of irradiation: Secondary | ICD-10-CM | POA: Diagnosis not present

## 2023-11-08 LAB — CMP (CANCER CENTER ONLY)
ALT: 13 U/L (ref 0–44)
AST: 19 U/L (ref 15–41)
Albumin: 3.8 g/dL (ref 3.5–5.0)
Alkaline Phosphatase: 79 U/L (ref 38–126)
Anion gap: 6 (ref 5–15)
BUN: 24 mg/dL — ABNORMAL HIGH (ref 8–23)
CO2: 32 mmol/L (ref 22–32)
Calcium: 9.4 mg/dL (ref 8.9–10.3)
Chloride: 98 mmol/L (ref 98–111)
Creatinine: 0.52 mg/dL — ABNORMAL LOW (ref 0.61–1.24)
GFR, Estimated: >60 mL/min (ref >60)
Glucose, Bld: 160 mg/dL — ABNORMAL HIGH (ref 70–99)
Potassium: 4 mmol/L (ref 3.5–5.1)
Sodium: 136 mmol/L (ref 135–145)
Total Bilirubin: 0.6 mg/dL (ref 0.0–1.2)
Total Protein: 6.6 g/dL (ref 6.5–8.1)

## 2023-11-08 LAB — CBC WITH DIFFERENTIAL (CANCER CENTER ONLY)
Abs Immature Granulocytes: 0.01 K/uL (ref 0.00–0.07)
Basophils Absolute: 0 K/uL (ref 0.0–0.1)
Basophils Relative: 0 %
Eosinophils Absolute: 0.1 K/uL (ref 0.0–0.5)
Eosinophils Relative: 1 %
HCT: 34.7 % — ABNORMAL LOW (ref 39.0–52.0)
Hemoglobin: 11.7 g/dL — ABNORMAL LOW (ref 13.0–17.0)
Immature Granulocytes: 0 %
Lymphocytes Relative: 12 %
Lymphs Abs: 0.6 K/uL — ABNORMAL LOW (ref 0.7–4.0)
MCH: 32.3 pg (ref 26.0–34.0)
MCHC: 33.7 g/dL (ref 30.0–36.0)
MCV: 95.9 fL (ref 80.0–100.0)
Monocytes Absolute: 0.4 K/uL (ref 0.1–1.0)
Monocytes Relative: 8 %
Neutro Abs: 3.6 K/uL (ref 1.7–7.7)
Neutrophils Relative %: 79 %
Platelet Count: 172 K/uL (ref 150–400)
RBC: 3.62 MIL/uL — ABNORMAL LOW (ref 4.22–5.81)
RDW: 13.6 % (ref 11.5–15.5)
WBC Count: 4.6 K/uL (ref 4.0–10.5)
nRBC: 0 % (ref 0.0–0.2)

## 2023-11-08 LAB — MAGNESIUM: Magnesium: 1.9 mg/dL (ref 1.7–2.4)

## 2023-11-08 MED ORDER — SODIUM CHLORIDE 0.9% FLUSH
10.0000 mL | Freq: Once | INTRAVENOUS | Status: AC
Start: 2023-11-08 — End: 2023-11-08
  Administered 2023-11-08: 10 mL

## 2023-11-08 MED ORDER — HEPARIN SOD (PORK) LOCK FLUSH 100 UNIT/ML IV SOLN
500.0000 [IU] | Freq: Once | INTRAVENOUS | Status: AC
  Administered 2023-11-08: 500 [IU]

## 2023-11-08 NOTE — Progress Notes (Signed)
 The following labs were ordered per tx plan for today:  CBC/Diff, CMP, and Mag.

## 2023-11-15 ENCOUNTER — Other Ambulatory Visit: Payer: Self-pay | Admitting: Oncology

## 2023-11-15 ENCOUNTER — Other Ambulatory Visit (INDEPENDENT_AMBULATORY_CARE_PROVIDER_SITE_OTHER): Payer: Self-pay | Admitting: Otolaryngology

## 2023-11-15 DIAGNOSIS — F418 Other specified anxiety disorders: Secondary | ICD-10-CM

## 2023-11-16 ENCOUNTER — Encounter: Payer: Self-pay | Admitting: Radiation Oncology

## 2023-11-16 ENCOUNTER — Other Ambulatory Visit: Payer: Self-pay

## 2023-11-16 ENCOUNTER — Encounter (HOSPITAL_COMMUNITY)
Admission: RE | Admit: 2023-11-16 | Discharge: 2023-11-16 | Disposition: A | Discharge status: Home / Self Care | Source: Ambulatory Visit | Attending: Radiology | Admitting: Radiology

## 2023-11-16 DIAGNOSIS — C760 Malignant neoplasm of head, face and neck: Secondary | ICD-10-CM | POA: Diagnosis not present

## 2023-11-16 DIAGNOSIS — C090 Malignant neoplasm of tonsillar fossa: Secondary | ICD-10-CM | POA: Diagnosis not present

## 2023-11-16 LAB — GLUCOSE, CAPILLARY: Glucose-Capillary: 142 mg/dL — ABNORMAL HIGH (ref 70–99)

## 2023-11-16 MED ORDER — FLUDEOXYGLUCOSE F - 18 (FDG) INJECTION
12.0000 | Freq: Once | INTRAVENOUS | Status: AC
  Administered 2023-11-16: 12 via INTRAVENOUS

## 2023-11-17 ENCOUNTER — Ambulatory Visit: Admitting: Sleep Medicine

## 2023-11-19 ENCOUNTER — Other Ambulatory Visit: Payer: Self-pay | Admitting: Nurse Practitioner

## 2023-11-19 ENCOUNTER — Other Ambulatory Visit: Payer: Self-pay | Admitting: Oncology

## 2023-11-19 ENCOUNTER — Telehealth: Payer: Self-pay

## 2023-11-19 ENCOUNTER — Other Ambulatory Visit: Payer: Self-pay

## 2023-11-19 DIAGNOSIS — G893 Neoplasm related pain (acute) (chronic): Secondary | ICD-10-CM

## 2023-11-19 MED ORDER — OXYCODONE HCL 5 MG PO TABS: 5.0000 mg | ORAL_TABLET | Freq: Four times a day (QID) | ORAL | 0 refills | Status: DC | PRN

## 2023-11-19 NOTE — Progress Notes (Signed)
 Opened in error

## 2023-11-19 NOTE — Telephone Encounter (Signed)
 A refill request was received per in-basket for Oxycodone  10 mg. It was noted that Dr. Pasam has discontinued this months ago for a change in pain management.   Spoke with patient's wife for clarification. Dr. Pasam had prescribed Hydrocodone  and Fentanyl  patch in place of the Oxycodone  10 mg. Wife reported today the the Hydrocodone  was making patient hallucinate and that she and her husband would like the Oxycodone  restarted at 5mg  instead of 10mg .   Spoke with Powell Lessen who agreed to restart the Oxycodone  at 5mg  and to discontinue the Hydrocodone . New Rx sent to [facility].   Called again and spoke with patient's wife again to update her on their request.

## 2023-11-23 ENCOUNTER — Inpatient Hospital Stay: Attending: Nurse Practitioner | Admitting: Dietician

## 2023-11-23 ENCOUNTER — Ambulatory Visit
Admission: RE | Admit: 2023-11-23 | Discharge: 2023-11-23 | Disposition: A | Discharge status: Home / Self Care | Source: Ambulatory Visit | Attending: Radiology | Admitting: Radiology

## 2023-11-23 DIAGNOSIS — C090 Malignant neoplasm of tonsillar fossa: Secondary | ICD-10-CM | POA: Insufficient documentation

## 2023-11-23 DIAGNOSIS — Z8546 Personal history of malignant neoplasm of prostate: Secondary | ICD-10-CM | POA: Insufficient documentation

## 2023-11-23 DIAGNOSIS — Z7982 Long term (current) use of aspirin: Secondary | ICD-10-CM | POA: Insufficient documentation

## 2023-11-23 DIAGNOSIS — Z923 Personal history of irradiation: Secondary | ICD-10-CM | POA: Insufficient documentation

## 2023-11-23 DIAGNOSIS — Z9221 Personal history of antineoplastic chemotherapy: Secondary | ICD-10-CM | POA: Insufficient documentation

## 2023-11-23 DIAGNOSIS — Z79899 Other long term (current) drug therapy: Secondary | ICD-10-CM | POA: Insufficient documentation

## 2023-11-23 DIAGNOSIS — E039 Hypothyroidism, unspecified: Secondary | ICD-10-CM | POA: Insufficient documentation

## 2023-11-23 DIAGNOSIS — R338 Other retention of urine: Secondary | ICD-10-CM | POA: Insufficient documentation

## 2023-11-23 NOTE — Addendum Note (Signed)
 Encounter addended by: Wyatt Leeroy HERO, PA-C on: 11/23/2023 3:03 PM  Actions taken: Clinical Note Signed

## 2023-11-23 NOTE — Progress Notes (Signed)
 Nutrition Follow-up:  Pt with stage II SCC of right tonsil, p16 positive. He completed concurrent chemoradiation (first chemo 3/21; final RT 5/16). Patient is under the care of Dr. Izell and Dr. Autumn.    S/p Gtube 2/28 - replaced at [facility] with non Enfit FT 5/28 DME: [employer]  Met with patient and wife in radiation clinic. Patient in wheelchair, however reports feeling a bit better everyday. He has been trying po. Had small bowl of pintos last night. He tried a hamburger recently. This was awful. He is taking small spoonfuls of water  and tolerating a few medications by mouth. Patient continues to have thick saliva which is worse in the morning. This is improving.   He continues to use tube for majority of nutrition/hydration. Wife is giving 6 cartons Nutren 1.5. Patient reports he never feels hungry. Will omit feeding before dinner as this is his favorite meal.    Medications: reviewed   Labs: 7/21 - glucose 160, BUN 24, Cr 0.52  Anthropometrics: Wt 253 lb 4 oz today - trending up  7/14 - 252 lb 8 oz 7/7 - 251 lb 8 oz    NUTRITION DIAGNOSIS: Unintended wt loss improving   INTERVENTION:  Continue po trials - recommend small bites/sips several times daily Decrease bolus feeds to promote increased po - 5 cartons Nutren 1.5 Support and encouragement     MONITORING, EVALUATION, GOAL: wt trends, intake   NEXT VISIT: Thursday August 21 via telephone

## 2023-11-23 NOTE — Progress Notes (Signed)
 Oncology Nurse Navigator Documentation   I met with Isaac Carpenter after his follow up with Ellie today. He reports continued improvement and plans to increase his oral intake as he is able. He is motivated to have his PEG tube out but understands that he must be able to eat all foods orally without losing weight. At [facility] request I have sent a message to Dr. Vallery office for an appointment in 4 months for physical exam. He will see Ellie in 1 month. He and his wife know to call me if they have any questions or concerns at any time.  Delon Jefferson RN, BSN, OCN Head & Neck Oncology Nurse Navigator [facility] at [facility] Phone # [phone]  Fax # [phone]

## 2023-11-23 NOTE — Progress Notes (Addendum)
 Radiation Oncology         [phone]) [phone] ________________________________  Name: Isaac Carpenter MRN: [id number]  Date: 11/23/2023  DOB: 04-08-1951  Follow-Up Visit Note Outpatient, in person  CC: [name], [name], MD  [name], [name], MD  Diagnosis and Prior Radiotherapy:       ICD-10-CM   1. Malignant neoplasm of tonsillar fossa (HCC)  C09.0 Ambulatory referral to Physical Therapy         Cancer Staging  Malignant neoplasm of prostate [facility]) Staging form: Prostate, AJCC 8th Edition - Clinical stage from 05/13/2022: Stage IIC (cT1c, cN0, cM0, PSA: 13.2, Grade Group: 3) - Signed by Sherwood Rise, PA-C on 07/17/2022 Histopathologic type: Adenocarcinoma, NOS Stage prefix: Initial diagnosis Prostate specific antigen (PSA) range: 10 to 19 Gleason primary pattern: 4 Gleason secondary pattern: 3 Gleason score: 7 Histologic grading system: 5 grade system Number of biopsy cores examined: 12 Number of biopsy cores positive: 4 Location of positive needle core biopsies: Both sides  Malignant neoplasm of tonsillar fossa (HCC) Staging form: Pharynx - HPV-Mediated Oropharynx, AJCC 8th Edition - Clinical stage from 06/08/2023: Stage II (cT3, cN2, cM0, p16+) - Signed by Izell Domino, MD on 06/08/2023 Stage prefix: Initial diagnosis  ==========DELIVERED PLANS==========  First Treatment Date: 2023-07-12 Last Treatment Date: 2023-09-03   Plan Name: HN_R_Tonsil Site: Tonsil, Right Technique: IMRT Mode: Photon Dose Per Fraction: 2 Gy Prescribed Dose (Delivered / Prescribed): 70 Gy / 70 Gy Prescribed Fxs (Delivered / Prescribed): 35 / 35  Stage II (cT3, N2, 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 When swallowing he reports 10/10 pain. Patient is taking xylocaine  and oxycodone  prior to meals.  Using a feeding tube?:  No issues Weight changes, if any: Using approximately 6 cartons/day, but denies any appetite due to this. Wt Readings from Last 3 Encounters:  11/23/23 (P) 253 lb 4 oz (114.9 kg)  11/01/23 252 lb 8 oz (114.5 kg)  10/25/23 251 lb 8 oz (114.1 kg)  Swallowing issues, if any: He experiences severe pain when swallowing, described as 'like pouring alcohol on a fresh cut wound.' Swallowing water  is painful. He states that he ate a couple spoonfuls of pinto beans. Smoking or chewing tobacco? Patient denies Using fluoride toothpaste daily? Yes, he reports using a fluoride gel. Last ENT visit was on: 05/27/23 Other notable issues, if jwb:Ejupzwu continues to experience the effects of his radiation treatment but improving. He reports feeling better.   ALLERGIES:  is allergic to other and testosterone.  Meds: Current Outpatient Medications  Medication Sig Dispense Refill   ALPRAZolam  (XANAX ) 1 MG tablet TAKE ONE-HALF TABLET BY MOUTH TWO TIMES DAILY AS NEEDED FOR ANXIETY 60 tablet 0   doxazosin  (CARDURA ) 1 MG tablet TAKE ONE TABLET BY MOUTH AT BEDTIME 30 tablet 1   esomeprazole  (NEXIUM ) 40 MG capsule Take 1 capsule (40 mg total) as directed daily at 12 noon. See instructions to administer through Tube. 30 capsule 0   guaiFENesin (MUCINEX FAST-MAX CHEST CONG MS) 100 MG/5ML liquid Place 20 mLs into feeding tube every 6 (six) hours as needed for cough or to loosen phlegm.     hyoscyamine  (LEVSIN /SL) 0.125 MG SL tablet Place 2 tablets (0.25 mg total)  under the tongue every 6 (six) hours as needed. 60 tablet 1   levothyroxine  (SYNTHROID ) 88 MCG tablet TAKE ONE TABLET BY MOUTH DAILY. 90 tablet 2   lidocaine  (XYLOCAINE ) 2 % solution Patient: Mix 1part 2% viscous lidocaine , 1part water . Swish and spit 10mL of diluted mixture every 2 hours as needed. 300 mL 3    lidocaine -prilocaine  (EMLA ) cream Apply to affected area once 30 g 3   Nutritional Supplements (NUTREN 1.5) LIQD 2 cartons Nutren 1.5 (500 ml) QID via tube. Flush with 60 ml water  before/after each bolus. Provide additional 250 ml water  flush 4x/day in between feedings to meet hydration needs. Provides 3000 kcal, 136 g protein, 1528 ml free water  (3008 ml total water ) 2000 ml/day meets 100% DRI     ondansetron  (ZOFRAN ) 8 MG tablet TAKE ONE TABLET BY MOUTH EVERY 8 HOURS AS NEEDED FOR NAUSEA AND VOMITING. START ON THE THIRD DAY AFTER CISPLATIN  30 tablet 1   ondansetron  (ZOFRAN -ODT) 8 MG disintegrating tablet Take 1 tablet (8 mg total) by mouth every 8 (eight) hours as needed for nausea or vomiting. 30 tablet 2   oxyCODONE  (OXY IR/ROXICODONE ) 5 MG immediate release tablet Take 1 tablet (5 mg total) by mouth every 6 (six) hours as needed for severe pain (pain score 7-10). 90 tablet 0   prochlorperazine  (COMPAZINE ) 10 MG tablet Take 1 tablet (10 mg total) by mouth every 6 (six) hours as needed (Nausea or vomiting). 30 tablet 1   solifenacin (VESICARE) 5 MG tablet Take 5 mg by mouth daily.     amitriptyline  (ELAVIL ) 25 MG tablet Take 1 tablet (25 mg total) by mouth at bedtime. (Patient not taking: Reported on 11/23/2023) 30 tablet 3   aspirin 81 MG chewable tablet Chew 81 mg by mouth daily.     clotrimazole  (MYCELEX ) 10 MG troche Suck through 1 troche 5 times daily for 14 consecutive days (Patient not taking: Reported on 11/23/2023) 70 Troche 0   dexamethasone  (DEXAMETHASONE  INTENSOL) 1 MG/ML solution TAKE BY MOUTH OR G-TUBE ONCE DAILY FOR THREE DAYS AFTER CHEMO (Patient not taking: Reported on 10/19/2023) 30 mL 0   fentaNYL  (DURAGESIC ) 50 MCG/HR Place 1 patch onto the skin every 3 (three) days. (Patient not taking: Reported on 11/23/2023) 10 patch 0   fluconazole  (DIFLUCAN ) 40 MG/ML suspension Take 5mL today, then 2.5mL daily for 20 more days. HOLD ATORVASTATIN  WHILE ON THIS. (Patient not taking: Reported on  11/23/2023) 55 mL 0   magic mouthwash (nystatin , diphenhydrAMINE, alum & mag hydroxide) suspension mixture Take 5 mLs by mouth 3 (three) times daily as directed. (Patient not taking: Reported on 11/23/2023) 150 mL 1   metoCLOPramide  (REGLAN ) 10 MG/10ML SOLN Place 10 mLs (10 mg total) into feeding tube 3 (three) times daily before meals. (Patient not taking: Reported on 11/23/2023) 900 mL 0   metoCLOPramide  (REGLAN ) 5 MG/5ML solution PLACE INTO FEEDING TUBE THREE TIMES DAILY BEFORE MEALS (Patient not taking: Reported on 11/23/2023) 900 mL 0   OLANZapine  (ZYPREXA ) 5 MG tablet Take 1 tablet (5 mg total) by mouth at bedtime. (Patient not taking: Reported on 11/23/2023) 30 tablet 1   traZODone  (DESYREL ) 50 MG tablet Take 1 tablet (50 mg total) by mouth at bedtime. (Patient not taking: Reported on 11/23/2023) 30 tablet 1   No current facility-administered medications for this encounter.   Physical Findings:   Wt Readings from Last 3 Encounters:  11/23/23 (P) 253 lb 4 oz (114.9 kg)  11/01/23 252 lb 8 oz (114.5 kg)  10/25/23 251 lb 8 oz (114.1 kg)    height is 6' 1 (1.854 m) (pended) and weight is 253 lb 4 oz (114.9 kg) (pended). His temporal temperature is 97.1 F (36.2 C) (abnormal, pended). His blood pressure is 137/63 (pended) and his pulse is 87 (pended). His respiration is 20 (pended). SABRA    HEENT: White coating on dry tongue, mucosal ulcer on uvula. Xerostomia throughout oral mucosa. Dry No tumor in upper throat. NECK: No masses in neck. Lymphedema in anterior neck. Dry skin over neck, no peeling.    Lab Findings: Lab Results  Component Value Date   WBC 4.6 11/08/2023   HGB 11.7 (L) 11/08/2023   HCT 34.7 (L) 11/08/2023   MCV 95.9 11/08/2023   PLT 172 11/08/2023    Lab Results  Component Value Date   TSH 2.872 06/14/2023    Radiographic Findings: NM PET Image Restag (PS) Skull Base To Thigh Result Date: 11/22/2023 CLINICAL DATA:  Subsequent treatment strategy for head/neck cancer.  EXAM: NUCLEAR MEDICINE PET SKULL BASE TO THIGH TECHNIQUE: 12.0 mCi F-18 FDG was injected intravenously. Full-ring PET imaging was performed from the skull base to thigh after the radiotracer. CT data was obtained and used for attenuation correction and anatomic localization. Fasting blood glucose: 142 mg/dl COMPARISON:  97/82/7974. FINDINGS: Mediastinal blood pool activity: SUV max 3.5 Liver activity: SUV max NA NECK: Mild residual right oropharyngeal soft tissue thickening and hypermetabolism, markedly decreased with SUV max 5.2, previously 31.7. No hypermetabolic lymph nodes. Incidental CT findings: None. CHEST: No abnormal hypermetabolism. Incidental CT findings: Right IJ Port-A-Cath terminates at the SVC RA junction. Atherosclerotic calcification of the aorta, aortic valve and coronary arteries. Heart is enlarged. No pericardial effusion. Minimal vague subpleural consolidation in the apical left upper lobe (7/12), new and likely infectious/inflammatory in etiology. No pleural effusion. ABDOMEN/PELVIS: No abnormal hypermetabolism. Incidental CT findings: Tiny right renal stones. Percutaneous gastrostomy. Radiotherapy seeds in the prostate. SKELETON: No abnormal hypermetabolism. Incidental CT findings: Degenerative changes in the spine. IMPRESSION: 1. Interval response to therapy as evidenced by marked decrease in hypermetabolism and soft tissue thickening associated with a right oropharyngeal mass, without complete resolution. No evidence of metastatic disease. 2. Tiny right renal stones. 3. Aortic atherosclerosis (ICD10-I70.0). Coronary artery calcification. Electronically Signed   By: Newell Eke M.D.   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 He and his fiance was very pleased to hear this results. We will also review his imaging at our tumor board.    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 No active cancer on PET imaging. Significant side effects from pain medication. - Patient states oxycodone  10 mg PRN makes pain tolerable.  - Encourage swallowing exercises. He continues to follow with SLP.  - Advise soft foods and thickened liquids.  Oral candidiasis White patches in throat possibly contributing to irritation and pain. Previous treatment may not have fully resolved infection. - He was previously prescribed clotrimazole  lozenges, but did not complete these. - Encouraged him to finish prescription of clotrimazole  as thrush could be contributing to some of his symptoms.  Lymphedema of the neck - Lymphedema likely secondary to radiation therapy. Referral placed to PT today.  Tonsil cancer No evidence of active cancer on examination and PET imaging. Good response to radiation therapy. Continued inflammation and healing expected.  Nutritional Status: Patient is starting to tolerate minimal oral intake. Wt stable. He states he has no appetite after 6 cartons/day. Encouraged patient to start decreasing PEG tube, in order to increase his appetite. He will continue to follow with nutrition.  Wt Readings from Last 3 Encounters:  11/23/23 (P) 253 lb 4 oz (114.9 kg)  11/01/23 252 lb 8 oz (114.5 kg)  10/25/23 251 lb 8 oz (114.1 kg)   PEG tube: In-place, using   Dental: Encouraged to continue regular followup with dentistry, and dental hygiene including fluoride rinses.   Thyroid  function: Checking annually Lab Results  Component Value Date   TSH 2.872 06/14/2023   Other: Mucositis/xerostomia - Patient has been encouraged to use saltwater/baking soda rinse as well as dry mouth remedies (regular sips of fluid, lozenges, biotene  gel, etc.).    Follow-up: Patient will follow-up with Dr. Autumn on 12/08/2023 and Dr. Soldatova in 4 months. I will see the patient back in 1 month, sooner if needed. Patient expressed understanding and is in agreement with the stated plan.       On date of service, in total, I spent 30 minutes on this encounter. Patient was seen in person. Note signed after encounter date; minutes pertain to date of service, only.  -----------------------------------   Leeroy Due, PA-C

## 2023-11-23 NOTE — Progress Notes (Addendum)
 Isaac Carpenter presents to the clinic today to see Leeroy Due PA-C for his 3 week follow up and to receive recent PET imaging results. His last radiation treatment was 09/03/23.  Pain issues, if any: Throat pain 1/10. When swallowing he reports 10/10 pain. Using a feeding tube?:  No issues Weight changes, if any:  Wt Readings from Last 3 Encounters: 11/23/23         253.4 lb   11/01/23 252 lb 8 oz (114.5 kg)  10/25/23 251 lb 8 oz (114.1 kg)      Swallowing issues, if any: He experiences severe pain when swallowing, described as 'like pouring alcohol on a fresh cut wound.' Swallowing water  is painful. He states that he ate a couple spoonfuls of pinto beans. Smoking or chewing tobacco? Patient denies Using fluoride toothpaste daily? Yes, he reports using a fluoride gel. Last ENT visit was on: 05/27/23 Other notable issues, if jwb:Ejupzwu continues to experience the effects of his radiation treatment but improving. He reports feeling better.  BP (P) 137/63 (BP Location: Right Leg, Patient Position: Sitting)   Pulse (P) 87   Temp (!) (P) 97.1 F (36.2 C) (Temporal)   Resp (P) 20   Ht (P) 6' 1 (1.854 m)   Wt (P) 253 lb 4 oz (114.9 kg)   BMI (P) 33.41 kg/m

## 2023-11-26 ENCOUNTER — Telehealth: Payer: Self-pay | Admitting: Radiation Oncology

## 2023-11-26 NOTE — Telephone Encounter (Addendum)
 8/8 Outgoing faxed to [facility] - Physical Therapy, Left voicemail for patient to be sch with Florina Sever B.-per Leeroy HERO, PA.  Waiting on call back to confirm.  Patient has not been sch yet.  Already reach out/waiting on call back -per [facility].

## 2023-12-01 ENCOUNTER — Other Ambulatory Visit: Payer: Self-pay | Admitting: Oncology

## 2023-12-01 DIAGNOSIS — N401 Enlarged prostate with lower urinary tract symptoms: Secondary | ICD-10-CM

## 2023-12-02 ENCOUNTER — Other Ambulatory Visit: Payer: Self-pay | Admitting: Oncology

## 2023-12-02 ENCOUNTER — Ambulatory Visit: Attending: Radiation Oncology

## 2023-12-02 DIAGNOSIS — R1310 Dysphagia, unspecified: Secondary | ICD-10-CM | POA: Diagnosis not present

## 2023-12-02 NOTE — Patient Instructions (Signed)
   Think softer, mushier food  Drink Gatorade  Use food journal  Talk to pharmacist about crushing your one pill and putting in applesauce

## 2023-12-02 NOTE — Therapy (Signed)
 OUTPATIENT SPEECH LANGUAGE PATHOLOGY ONCOLOGY TREATMENT   Patient Name: Isaac Carpenter MRN: [id number] DOB:1950/10/20, 73 y.o., male Today's Date: 12/02/2023  PCP: [name], Betty, MD REFERRING PROVIDER: Izell Domino, MD  END OF SESSION:  End of Session - 12/02/23 1310     Visit Number 5    Number of Visits 7    Date for SLP Re-Evaluation 01/31/24    SLP Start Time 0937    SLP Stop Time  1017    SLP Time Calculation (min) 40 min    Activity Tolerance Patient tolerated treatment well             Past Medical History:  Diagnosis Date   Arthritis    Cancer (HCC)    History of partial thyroidectomy STATES OVERACTIVE THYROID -- NO ISSUES SINCE AGE 79 AND NO MEDS   Hyperlipidemia    Hypertension    Hypothyroidism    Sleep apnea    Per patient he tried CPAP but could not tolerate.   Thyroid  disease    Past Surgical History:  Procedure Laterality Date   CIRCUMCISION  01/04/2012   Procedure: CIRCUMCISION ADULT;  Surgeon: Arlena LILLETTE Gal, MD;  Location[facility];  Service: Urology;  Laterality: N/A;   COLONOSCOPY  04/08/2020   Vito Cirigliano   CYSTOSCOPY  09/04/2022   Procedure: CYSTOSCOPY;  Surgeon: Matilda Senior, MD;  Location: [facility];  Service: Urology;;   IR GASTROSTOMY TUBE MOD SED  06/18/2023   IR IMAGING GUIDED PORT INSERTION  06/18/2023   RADIOACTIVE SEED IMPLANT N/A 09/04/2022   Procedure: RADIOACTIVE SEED IMPLANT/BRACHYTHERAPY IMPLANT;  Surgeon: Matilda Senior, MD;  Location: [facility];  Service: Urology;  Laterality: N/A;  90 MINS   SPACE OAR INSTILLATION N/A 09/04/2022   Procedure: SPACE OAR INSTILLATION;  Surgeon: Matilda Senior, MD;  Location: [facility];  Service: Urology;  Laterality: N/A;   THYROIDECTOMY, PARTIAL  AGE 79   OVERACTIVE THYROID    Patient Active Problem List   Diagnosis Date Noted   Cancer (HCC) 08/14/2023   Febrile neutropenia (HCC) 08/14/2023   S/P  percutaneous endoscopic gastrostomy (PEG) tube placement (HCC) 08/14/2023   Hyponatremia 08/14/2023   Normocytic anemia 08/14/2023   Chemotherapy induced neutropenia (HCC) 07/30/2023   Fatigue 07/16/2023   Situational anxiety 07/09/2023   Encounter for antineoplastic chemotherapy 07/09/2023   Dysphagia 07/09/2023   Port-A-Cath in place 07/05/2023   Sensorineural hearing loss (SNHL) of right ear 06/23/2023   Cancer associated pain 06/11/2023   Coordination of complex care 06/11/2023   Malignant neoplasm of tonsillar fossa (HCC) 06/08/2023   Malignant neoplasm of prostate (HCC) 07/17/2022   Hyperlipidemia 05/11/2021   Morbid obesity (HCC)-BMI 37.8 with DM II,HLD,OSA,HTN 05/09/2021   Hypertension, essential, benign 07/23/2020   OSA (obstructive sleep apnea) 07/23/2020   Type 2 diabetes mellitus with other specified complication (HCC) 01/31/2020   BPH associated with nocturia 01/31/2020   Hypothyroidism, postsurgical 01/31/2020     ONSET DATE: see pertinent history   REFERRING DIAG: Malignant Neoplasm of tonsillar fossa  THERAPY DIAG:  Dysphagia, unspecified type  Rationale for Evaluation and Treatment: Rehabilitation  SUBJECTIVE:   SUBJECTIVE STATEMENT: I'm just never hungry.   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 He presented to his PCP on 05/17/23 with complaints of right sided jaw/maxillary pain when coughing. 05/17/23 He underwent a CT neck which revealed an ulcerated enhancing  mass involving the superior oropharynx and soft palate/uvula measuring 3.4 x 3.0 x 3.6 cm in the greatest extent that is concerning for malignancy. No evidence of cervical lymphadenopathy was indicated on scan. 05/27/23 He saw Dr. Soldatova. She completed a laryngoscopy with biopsies which revealed Invasive moderately differentiated SCC, p 16 +. 06/04/23 visit with Dr. Soldatova for worsening symptoms  of odynophagia and dysphagia. He was unable to maintain adequate nutrition. 06/07/23 PET revealing a right oropharyngeal tumor >4cm, likely b/l level II adenopathy, no distant metastases. Consult with Dr. Izell 06/08/23, patient reported extreme pain when swallowing that prevents him from eating or swallowing, PEG tube scheduled. 2/20 consult with Dr. Autumn. He will receive chemotherapy/radiation. Treatment plan:  He will receive 35 fractions of radiation to his right Tonsil and bilateral neck with weekly chemotherapy which started on 07/12/23 and will complete 08/27/23. Pretreatment procedures: PEG/PAC 06/18/23. MBSS 06/28/23. When he presented for consult he had severe dysphagia and PEG was placed quickly. He did recover from the dysphagia (patient thinks it was related to biopsy pain).  PAIN:  Are you having pain? Yes: NPRS scale: 2/10 Pain location: Throat Pain description: sore Aggravating factors: swallowing Relieving factors: meds (low dose of hydrocodone )   FALLS: Has patient fallen in last 6 months?  No  PATIENT GOALS: Maintain WNL swallowing  OBJECTIVE:  Note: Objective measures were completed at Evaluation unless otherwise noted. DIAGNOSTIC FINDINGS: See Pertinent history above  INSTRUMENTAL SWALLOW STUDY FINDINGS (MBSS) 06/28/23 Clinical Impression: Patient presents with an oropharyngeal swallow that is largely [facility]. No penetration or aspiration occured during any phase of the swallow with any of the tested liquid or solid consistencies. Anterior hyoid excursion appeared partial in completion but patient with full epiglottic inversion and laryngeal vestibule closure. With solids, he did exhibit increased amount of vallecular residuals but with clearance with subsequent swallows and sips of liquids. PES opening appeared [facility] and no retrograde flow of barium observed in upper esophagus. Trace amount of barium residue remained in oropharynx at level of known mass, however barium fully cleared  with subsequent swallows. Barium tablet transit appeared to stall in distal esophagus.    SLP recommends referral to OP SLP secondary to planned oropharyngeal chemoradiation treatment.   Recommendations/Plan: Swallowing Evaluation Recommendations PO Diet Recommendation: Regular; Thin liquids (Level 0) Liquid Administration via: Cup; Straw Medication Administration: Whole meds with liquid Supervision: Patient able to self-feed Swallowing strategies  : Follow solids with liquids Postural changes: Position pt fully upright for meals Oral care recommendations: Oral care BID (2x/day)                                                                                                                            TREATMENT DATE:   12/02/23: Pt has tried pinto beans, hash browns and fried egg, pancakes, and turnip greens. Likes Gatorade but not water . Does not have appetite - SLP suggested holding 1/2 of a feeding or two during the day to increase hunger  level as pt stated if he were hungry he thinks he would try to eat more frequently and amount. Today he ate applesauce and drank H2O without any overt s/sx pharyngeal deficits, nor oral deficits. SLP reminded pt about food journal and suggested pt start one. SLP assisted pt making one with food he has tried thus far. Pt told SLP benefit of filling one out. SLP strongly encouraged pt to complete HEP and reminded pt of rationale.  11/02/23: Yesterday Dr. Izell prescribed Chloraseptic lozenges as well as spray. Pt and SO to pick up ASAP. [name] stated Dr. Izell told pt to discharge med for thrush.  Pt c/o pain as above but was willing to participate with SLP today. He has not completed HEP due to pain, minimal POs since ~ two weeks ago when thrush began. Prior to this was doing more liquids during the day. Pt with occasional coughing prior to POs. Pt took four 1/3 teaspoons water  today with delayed cough (15-30 seconds later) with 3/4 of them. Pt stated cough due to  tickle and was unsure if a wrong pipe situation was felt with each of these. SLP suspects pt not swallowing with full ROM due to anticipation of pain increasing to 10/10. No overt s/sx aspiration PNA but SLP gave these to pt today with explanation if having these s/sx discontinue all PO asap and inform/notify MDs. Recommended pt have at least 10 ice chips at least 8 times/day to inhibit muscle disuse atrophy, encourage swallowing musculature. SLP told pt to perform HEP reps as he is able and provided rationale. SLP does not think MBS necessary at this time as pt's pain would skew results (suspect pt is not using full ROM), but would recommend when pt's pain is better managed, or pt has a more immediate cough response to POs, or pt has s/sx aspiration PNA.   10/04/23: Pt with rough voice today. [name] reports he was very groggy and verbally hallucinating last night on Fentanyl  patch. She removed the patch and is picking up a lower dose today after pt obtains fluids at [facility]. He has had minimal PO daily. Today pt had multiple teaspoons of water  without overt s/sx aspiration. When he took one of three cup sips, pt had violent coughing. SLP told pt to have multiple teaspoons water  (preferably warm), 8-10 times a day and explained rationale for this. SLP ascertained through having pt take different SIZE TEASPOON SIPS that pt's odynophagia is ASSOCIATED WITH SIP SIZE. SLP told pt to have teaspoon sips and pain did not incr greater than 3/10. With HEP, pt has not been completing as directed. SLP again reinforced rationale for HEP and pt's performance was WNL with rare min A for tongue protrusion. SLP told pt to perform with drops of water  as he needs. Breathing sounded slightly labored today but after pt coughed, breathing was not sounding as labored. Sixty seconds later pt's breathing became more labored again, but not as labored as originally. [name] reports that when he coughs and frees phlegm, it is hardened. SLP  strongly suggested pt drink teaspoons of warm water  to more easily free dried phlegm (assumed) from pharynx. Wife is running humidifiers in the home.   09/02/23: Pt c/o pain in throat 10/10. Is not doing any reps of HEP currently, and has not done any since last ST session. Pt told SLP rationale of HEP with independence. Pt took 1/2-3/4 teaspoons water  today without s/sx oral, or overt s/sx pharyngeal deficits. SLP encouraged pt to have at least  2-3 teaspoons of water  at least 2-3 times/day and increase this as pt is able, up to small sips of water  multiple times per day. SLP reminded pt about muscle disuse atrophy and that this was possible due to the time pt has not had anything PO. SLP educated pt and SO about food journal.  SLP demonstrated each exercise of HEP (Except Shaker) with pt and SO, and described a few examples of how pt could add HEP back into his daily routine, with goal being at least 20 reps of effortful, Masako, and Mendelsohn per day. SLP reiterated pt could perform multiple reps of Shaker without the need to swallow and encouraged pt to perform this exercise 9-12 times/day starting today.  07/22/23: Research states the risk for dysphagia increases due to radiation and/or chemotherapy treatment due to a variety of factors, so SLP educated the pt about the possibility of reduced/limited ability for PO intake during rad tx. SLP also educated pt regarding possible changes to swallowing musculature after rad tx, and why adherence to dysphagia HEP provided today and PO consumption was necessary to inhibit muscle fibrosis following rad tx and to mitigate muscle disuse atrophy. SLP informed pt why this would be detrimental to their swallowing status and to their pulmonary health. Pt demonstrated understanding of these things to SLP. SLP encouraged pt to safely eat and drink as deep into their radiation/chemotherapy as possible to provide the best possible long-term swallowing outcome for pt.  SLP  then developed an individualized HEP for pt involving oral and pharyngeal strengthening and ROM and pt was instructed how to perform these exercises, including SLP demonstration. After SLP demonstration, pt return demonstrated each exercise. SLP ensured pt performance was correct prior to educating pt on next exercise. Pt required min-mod cues faded to modified independent to perform HEP. Pt was instructed to complete this program 5-7 days/week, at least 20 reps a day until 6 months after his last day of rad tx, and then x2/week after that, indefinitely. Among other modifications for days when pt cannot functionally swallow, SLP also suggested pt to perform only non-swallowing tasks on the handout/HEP, and if necessary to cycle through the swallowing portion so the full program of exercises can be completed instead of fatiguing on one of the swallowing exercises and being unable to perform the other swallowing exercises. SLP instructed that swallowing exercises should then be added back into the regimen as pt is able to do so.   PATIENT EDUCATION: Education details: late effects head/neck radiation on swallow function, HEP procedure, and modification to HEP when difficulty experienced with swallowing during and after radiation course Person educated: Patient and Caregiver [name] Education method: Explanation, Demonstration, Verbal cues, and Handouts Education comprehension: verbalized understanding, returned demonstration, verbal cues required, and needs further education   ASSESSMENT:  CLINICAL IMPRESSION: Patient is a 73 y.o. M who was seen today for treatment of swallowing after radiation/chemoradiation therapy. See treatment date for details on today's session. There are no overt s/s aspiration PNA observed by SLP nor any reported by pt at this time. Pt continues PEG dependent and is trying some foods; SLP assisted pt today filling out a food journal for foods he has tried. Data indicate that pt's  swallow ability could very well decline over time following the conclusion of rad therapy due to muscle disuse atrophy and/or muscle fibrosis. Pt will cont to need to be seen by SLP in order to assess safety of PO intake, assess the need for recommending any objective  swallow assessment, and ensuring pt is correctly completing the individualized HEP.  OBJECTIVE IMPAIRMENTS: include dysphagia. These impairments are limiting patient from safety when swallowing. Factors affecting potential to achieve goals and functional outcome are none noted today. Patient will benefit from skilled SLP services to address above impairments and improve overall function.  REHAB POTENTIAL: Good   GOALS: Goals reviewed with patient? No  SHORT TERM GOALS: Target: 3rd total session   1. Pt will compelte HEP with modified independence in 2 sessions Baseline: Goal status: not met   2.  pt will tell SLP why pt is completing HEP with modified independence Baseline:  Goal status: Met   3.  pt will describe 3 overt s/s aspiration PNA with modified independence Baseline:  Goal status: moved to LTG   4.  pt will tell SLP how a food journal could hasten return to a more normalized diet Baseline:  Goal status: Met     LONG TERM GOALS: Target: 7th total session   1.  pt will complete HEP with independence over two visits Baseline:  Goal status: INITIAL   2.  pt will describe how to modify HEP over time, and the timeline associated with reduction in HEP frequency with modified independence over two sessions Baseline:  Goal status: INITIAL  3.  pt will describe 3 overt s/s aspiration PNA with modified independence Baseline:  Goal status: met     PLAN:   SLP FREQUENCY:  once approx every 4 weeks   SLP DURATION:  7 sessions   PLANNED INTERVENTIONS: Aspiration precaution training, Pharyngeal strengthening exercises, Diet toleration management , Trials of upgraded texture/liquids, SLP instruction and  feedback, Compensatory strategies, and Patient/family education, [phone] (treatment of swallowing dysfunction and/or oral function for feeding)   Sincere Liuzzi, CCC-SLP 12/02/2023, 1:11 PM

## 2023-12-03 ENCOUNTER — Other Ambulatory Visit (HOSPITAL_COMMUNITY): Payer: Self-pay

## 2023-12-03 MED ORDER — ESOMEPRAZOLE MAGNESIUM 40 MG PO CPDR
40.0000 mg | DELAYED_RELEASE_CAPSULE | Freq: Every day | ORAL | 0 refills | Status: DC
  Filled 2023-12-03: qty 30, 30d supply, fill #0

## 2023-12-04 ENCOUNTER — Encounter: Payer: Self-pay | Admitting: Physical Therapy

## 2023-12-07 ENCOUNTER — Telehealth: Payer: Self-pay | Admitting: Radiation Oncology

## 2023-12-07 ENCOUNTER — Other Ambulatory Visit: Payer: Self-pay

## 2023-12-07 DIAGNOSIS — T451X5A Adverse effect of antineoplastic and immunosuppressive drugs, initial encounter: Secondary | ICD-10-CM

## 2023-12-07 DIAGNOSIS — C090 Malignant neoplasm of tonsillar fossa: Secondary | ICD-10-CM

## 2023-12-07 DIAGNOSIS — C61 Malignant neoplasm of prostate: Secondary | ICD-10-CM

## 2023-12-07 DIAGNOSIS — D649 Anemia, unspecified: Secondary | ICD-10-CM

## 2023-12-07 NOTE — Telephone Encounter (Signed)
 8/19 Follow up called to [facility], spoke to [facility], called pt 3 times and follow up with mychart message, still no response.  Referral has been closed at this point.

## 2023-12-08 ENCOUNTER — Inpatient Hospital Stay

## 2023-12-08 ENCOUNTER — Telehealth: Payer: Self-pay

## 2023-12-08 ENCOUNTER — Encounter: Payer: Self-pay | Admitting: Oncology

## 2023-12-08 ENCOUNTER — Other Ambulatory Visit: Payer: Self-pay

## 2023-12-08 ENCOUNTER — Inpatient Hospital Stay (HOSPITAL_BASED_OUTPATIENT_CLINIC_OR_DEPARTMENT_OTHER): Admitting: Oncology

## 2023-12-08 ENCOUNTER — Other Ambulatory Visit (HOSPITAL_COMMUNITY): Payer: Self-pay

## 2023-12-08 VITALS — BP 100/58 | HR 85 | Temp 98.3°F | Resp 17 | Wt 248.3 lb

## 2023-12-08 DIAGNOSIS — Z8546 Personal history of malignant neoplasm of prostate: Secondary | ICD-10-CM | POA: Diagnosis not present

## 2023-12-08 DIAGNOSIS — Z9221 Personal history of antineoplastic chemotherapy: Secondary | ICD-10-CM | POA: Diagnosis not present

## 2023-12-08 DIAGNOSIS — D649 Anemia, unspecified: Secondary | ICD-10-CM

## 2023-12-08 DIAGNOSIS — C090 Malignant neoplasm of tonsillar fossa: Secondary | ICD-10-CM

## 2023-12-08 DIAGNOSIS — Z923 Personal history of irradiation: Secondary | ICD-10-CM | POA: Diagnosis not present

## 2023-12-08 DIAGNOSIS — T451X5A Adverse effect of antineoplastic and immunosuppressive drugs, initial encounter: Secondary | ICD-10-CM

## 2023-12-08 DIAGNOSIS — E039 Hypothyroidism, unspecified: Secondary | ICD-10-CM | POA: Diagnosis not present

## 2023-12-08 DIAGNOSIS — C61 Malignant neoplasm of prostate: Secondary | ICD-10-CM

## 2023-12-08 DIAGNOSIS — Z79899 Other long term (current) drug therapy: Secondary | ICD-10-CM | POA: Diagnosis not present

## 2023-12-08 DIAGNOSIS — Z7982 Long term (current) use of aspirin: Secondary | ICD-10-CM | POA: Diagnosis not present

## 2023-12-08 DIAGNOSIS — R338 Other retention of urine: Secondary | ICD-10-CM | POA: Diagnosis not present

## 2023-12-08 LAB — CMP (CANCER CENTER ONLY)
ALT: 10 U/L (ref 0–44)
AST: 17 U/L (ref 15–41)
Albumin: 3.8 g/dL (ref 3.5–5.0)
Alkaline Phosphatase: 72 U/L (ref 38–126)
Anion gap: 6 (ref 5–15)
BUN: 16 mg/dL (ref 8–23)
CO2: 31 mmol/L (ref 22–32)
Calcium: 9 mg/dL (ref 8.9–10.3)
Chloride: 101 mmol/L (ref 98–111)
Creatinine: 0.43 mg/dL — ABNORMAL LOW (ref 0.61–1.24)
GFR, Estimated: >60 mL/min (ref >60)
Glucose, Bld: 122 mg/dL — ABNORMAL HIGH (ref 70–99)
Potassium: 3.8 mmol/L (ref 3.5–5.1)
Sodium: 138 mmol/L (ref 135–145)
Total Bilirubin: 0.6 mg/dL (ref 0.0–1.2)
Total Protein: 6 g/dL — ABNORMAL LOW (ref 6.5–8.1)

## 2023-12-08 LAB — CBC WITH DIFFERENTIAL (CANCER CENTER ONLY)
Abs Immature Granulocytes: 0.01 K/uL (ref 0.00–0.07)
Basophils Absolute: 0 K/uL (ref 0.0–0.1)
Basophils Relative: 0 %
Eosinophils Absolute: 0.1 K/uL (ref 0.0–0.5)
Eosinophils Relative: 3 %
HCT: 33.3 % — ABNORMAL LOW (ref 39.0–52.0)
Hemoglobin: 11.2 g/dL — ABNORMAL LOW (ref 13.0–17.0)
Immature Granulocytes: 0 %
Lymphocytes Relative: 14 %
Lymphs Abs: 0.5 K/uL — ABNORMAL LOW (ref 0.7–4.0)
MCH: 31.8 pg (ref 26.0–34.0)
MCHC: 33.6 g/dL (ref 30.0–36.0)
MCV: 94.6 fL (ref 80.0–100.0)
Monocytes Absolute: 0.4 K/uL (ref 0.1–1.0)
Monocytes Relative: 11 %
Neutro Abs: 2.6 K/uL (ref 1.7–7.7)
Neutrophils Relative %: 72 %
Platelet Count: 167 K/uL (ref 150–400)
RBC: 3.52 MIL/uL — ABNORMAL LOW (ref 4.22–5.81)
RDW: 13.5 % (ref 11.5–15.5)
WBC Count: 3.6 K/uL — ABNORMAL LOW (ref 4.0–10.5)
nRBC: 0 % (ref 0.0–0.2)

## 2023-12-08 LAB — MAGNESIUM: Magnesium: 1.8 mg/dL (ref 1.7–2.4)

## 2023-12-08 LAB — TSH: TSH: 3.4 u[IU]/mL (ref 0.350–4.500)

## 2023-12-08 MED ORDER — NYSTATIN 100000 UNIT/ML MT SUSP
5.0000 mL | Freq: Three times a day (TID) | OROMUCOSAL | 3 refills | Status: DC
  Filled 2023-12-08: qty 150, 10d supply, fill #0
  Filled 2024-01-17: qty 150, 10d supply, fill #1

## 2023-12-08 MED ORDER — FAMOTIDINE IN NACL 20-0.9 MG/50ML-% IV SOLN: 20.0000 mg | Freq: Once | INTRAVENOUS | Status: DC

## 2023-12-08 MED ORDER — ONDANSETRON HCL 4 MG/2ML IJ SOLN: 8.0000 mg | Freq: Once | INTRAMUSCULAR | Status: DC

## 2023-12-08 MED ORDER — SODIUM CHLORIDE 0.9% FLUSH
10.0000 mL | Freq: Once | INTRAVENOUS | Status: AC
  Administered 2023-12-08: 10 mL

## 2023-12-08 MED ORDER — SODIUM CHLORIDE 0.9 % IV SOLN: Freq: Once | INTRAVENOUS | Status: DC

## 2023-12-08 NOTE — Progress Notes (Signed)
 [facility]  ONCOLOGY CLINIC PROGRESS NOTE   Patient Care Team: [name], [name], MD as PCP - General (Family Medicine) Vertell Pont, RN as Oncology Nurse Navigator Octavia Charleston, MD as Referring Physician (Ophthalmology) Matilda Senior, MD as Consulting Physician (Urology) Patrcia Cough, MD as Consulting Physician (Radiation Oncology) Starla Wendelyn BIRCH, RN as Registered Nurse Crawford, Morna Pickle, NP as Nurse Practitioner (Hematology and Oncology) Izell Domino, MD as Attending Physician (Radiation Oncology) Okey Burns, MD as Consulting Physician (Otolaryngology) Malmfelt, Delon CROME, RN as Oncology Nurse Navigator Autumn Millman, MD as Consulting Physician (Oncology)  PATIENT NAME: Isaac Carpenter   MR#: [id number] DOB: 02-12-1951  Date of visit: 12/08/2023   ASSESSMENT & PLAN:   Isaac Carpenter is a 73 y.o. gentleman with a past medical history of prostate cancer diagnosed in March 2024, S/P radioactive seed implant/brachytherapy, hypertension, dyslipidemia, hypothyroidism, obstructive sleep apnea. He presented for follow up of recently diagnosed squamous cell carcinoma of the right tonsil, clinical stage II disease (cT3,cN2,cM0,p16+).   Malignant neoplasm of tonsillar fossa (HCC) Please review oncology history for additional details and timeline of events.  Symptoms include severe pain and dysphagia since mid-November, progressively worsening.   cT3,cN2,cM0,p16+ tumor, Stage II disease.   His case was discussed in tumor conference on 06/09/2023.  Given the extent of disease, consensus opinion is to proceed with concurrent chemoradiation. We have discussed about role of cisplatin  being a radiosensitizer in the treatment of head and neck cancer.  We have discussed about the curative intent of chemoradiation for this patient.  Patient was willing to proceed with weekly cisplatin  after discussing risk versus benefits and side effect profile.  He started  cycle 1 of cisplatin  at a dose of 40 mg/m on 07/09/2023.  Plan was to continue cisplatin  weekly during the course of radiation.  He began radiation treatments from 07/12/2023.  During week 2, chemotherapy had to be held because of severe fatigue with ECOG performance status of 3.  He received total of 4 cycles of Cisplatin . Last cisplatin  was on 08/06/23. Stopped early because of side effects including neutropenia/ leukopenia, fatigue, nausea, weight loss.   Restaging PET scan on 11/16/2023 showed overall improvement in disease without evidence of disease progression.  His case was discussed in tumor conference earlier today and consensus opinion is that there is no evidence of disease.  Will continue surveillance as per NCCN guidelines.  Dysphagia and oral candidiasis following chemoradiation Dysphagia persists with difficulty swallowing solid foods, likely due to oral candidiasis. Previous treatment with clotrimazole  lozenges was not completed as prescribed, contributing to persistent symptoms. - Instruct to use clotrimazole  lozenges five times daily for 14 days, ensuring mouth is moistened before use. - Prescribe magic mouthwash with numbing agent to aid in symptom relief.  Radiation-induced lymphedema of neck Radiation-induced lymphedema of the neck causing fluid swelling. He is scheduled for physical therapy to address this issue. - Continue with physical therapy for lymphedema management. - Follow up with speech and swallow therapy on the 16th.  Malnutrition secondary to cancer treatment Malnutrition due to decreased oral intake following cancer treatment. Weight loss noted, with reliance on tube feeds when oral intake is insufficient. - Continue nutritional support with tube feeds as needed. - Consult with nutritionist scheduled for tomorrow to optimize nutritional intake. - Encourage gradual increase in physical activity, aiming for 30 minutes of walking daily.  Urinary retention due  to benign prostatic hyperplasia Urinary retention due to benign prostatic hyperplasia, causing frequent nocturnal urination.  Currently managed with doxazosin  at night and solifenacin in the morning. - Consider increasing solifenacin dose at night in addition to doxazosin  to manage symptoms.  I reviewed lab results and outside records for this visit and discussed relevant results with the patient. Diagnosis, plan of care and treatment options were also discussed in detail with the patient. Opportunity provided to ask questions and answers provided to his apparent satisfaction. Provided instructions to call our clinic with any problems, questions or concerns prior to return visit. I recommended to continue follow-up with PCP and sub-specialists. He verbalized understanding and agreed with the plan.   NCCN guidelines have been consulted in the planning of this patient's care.  I spent a total of 40 minutes during this encounter with the patient including review of chart and various tests results, discussions about plan of care and coordination of care plan.   Chinita Patten, MD  12/08/2023 5:48 PM  [facility] - A DEPT OF [facility] [address] [place] [address] Dept: [phone] Dept Fax: [phone]    CHIEF COMPLAINT/ REASON FOR VISIT:   Follow-up for squamous cell carcinoma of the right tonsil, stage II disease (cT3,cN2,cM0,p16+)  Current Treatment: Concurrent chemoradiation with weekly cisplatin  started from 07/09/2023.  INTERVAL HISTORY:    Discussed the use of AI scribe software for clinical note transcription with the patient, who gave verbal consent to proceed.   History of Present Illness   Isaac Carpenter is a 72 year old male with a history of cancer who presents with difficulty swallowing.  He has ongoing difficulty swallowing, unable to get food down even when cut into small pieces, often  spitting it out. He can manage to swallow applesauce and describes a sensation of fluid under his throat that comes and goes.  He completed four chemotherapy sessions from March 21 to April 18, after which chemotherapy was stopped. His blood counts have since improved, with a white count of 3,600 and hemoglobin at 11.2, up from a previous low of 8.8. He experienced significant weight loss during treatment.  He is currently taking 5 mg of oxycodone , usually before attempting to eat, and continues to use a feeding tube, adjusting the amount based on his oral intake. He has an appointment with a nutritionist scheduled for the following day.  He has a history of a white patch in his mouth and reports a dry tongue. He was prescribed antifungal lozenges, which were difficult to use due to dryness, and he did not complete the full course. He uses salt and soda rinses for oral care.  He experiences frequent nighttime urination due to prostate issues, for which he takes doxazosin  at night and solifenacin in the morning. He reports occasional good nights of sleep, with a recent instance of sleeping through the night without bathroom trips.  He engages in some physical activity, walking and sitting in a chair to watch TV, but has lost weight and is working on increasing his activity level.    I have reviewed the past medical history, past surgical history, social history and family history with the patient and they are unchanged from previous note.  HISTORY OF PRESENT ILLNESS:   ONCOLOGY HISTORY:   He presented to his PCP Dr Betty [name] on 05-17-23 with complains of right-sided jaw/maxillary pain that tends to occur when coughing. Pain had persisted for several months at that time following an upper respiratory infection.  Subsequently, he underwent a CT soft tissue neck on 05-17-23 which revealed an ulcerated enhancing mass involving the superior oropharynx and soft palate/uvula measuring 3.4 x 3.0 x 3.6  cm in the greatest extent that is concerning for malignancy. No evidence of cervical lymphadenopathy was indicated on scan    In light of findings, the patient saw Dr. Elena Larry on 05-27-23 with worsening symptoms. During his visit, he underwent a flexible fiberoptic laryngoscopy with several biopsies of the friable tissue from the right oropharyngeal mass.     Biopsy of right oropharynx mass on 05-27-23 revealed: Invasive moderately differentiated squamous cell carcinoma. Immunohistochemistry for p16 shows diffuse strong positivity.    During most recent visit with Dr. Soldatova on 06-04-23, patient reported constant worsening symptoms of odynophagia and dysphagia. Stating that he's unable to maintain adequate nutrition as food typically consists of few bites of soft food and liquids. Pain does not seem to be managed by pain relievers including oxycodone . To further evaluate the extent of the disease, he underwent a PET scan performed on 06-07-23 revealing a right  oropharyngeal tumor >4cm, likely b/l level II adenopathy, no distant metastases.    cT3,cN2,cM0,p16+ tumor, Stage II disease.    Plan made for concurrent chemoradiation with weekly cisplatin . Started Cisplatin  from 07/09/2023 and radiation from 07/12/23.   He received total of 4 cycles of Cisplatin . Last cisplatin  was on 08/06/23. Stopped early because of side effects including neutropenia/ leukopenia, fatigue, nausea, weight loss.   Restaging PET scan on 11/16/2023 showed overall improvement in disease without evidence of disease progression.  Oncology History  Malignant neoplasm of prostate (HCC)  05/13/2022 Cancer Staging   Staging form: Prostate, AJCC 8th Edition - Clinical stage from 05/13/2022: Stage IIC (cT1c, cN0, cM0, PSA: 13.2, Grade Group: 3) - Signed by Sherwood Rise, PA-C on 07/17/2022 Histopathologic type: Adenocarcinoma, NOS Stage prefix: Initial diagnosis Prostate specific antigen (PSA) range: 10 to 19 Gleason primary  pattern: 4 Gleason secondary pattern: 3 Gleason score: 7 Histologic grading system: 5 grade system Number of biopsy cores examined: 12 Number of biopsy cores positive: 4 Location of positive needle core biopsies: Both sides   07/17/2022 Initial Diagnosis   Malignant neoplasm of prostate (HCC)   Malignant neoplasm of tonsillar fossa (HCC)  06/08/2023 Initial Diagnosis   Malignant neoplasm of tonsillar fossa (HCC)   06/08/2023 Cancer Staging   Staging form: Pharynx - HPV-Mediated Oropharynx, AJCC 8th Edition - Clinical stage from 06/08/2023: Stage II (cT3, cN2, cM0, p16+) - Signed by Izell Domino, MD on 06/08/2023 Stage prefix: Initial diagnosis   07/09/2023 - 09/09/2023 Chemotherapy   Patient is on Treatment Plan : HEAD/NECK Cisplatin  (40) q7d         REVIEW OF SYSTEMS:   Review of Systems - Oncology  All other pertinent systems were reviewed with the patient and are negative.  ALLERGIES: He is allergic to other and testosterone.  MEDICATIONS:  Current Outpatient Medications  Medication Sig Dispense Refill   ALPRAZolam  (XANAX ) 1 MG tablet TAKE ONE-HALF TABLET BY MOUTH TWO TIMES DAILY AS NEEDED FOR ANXIETY 60 tablet 0   amitriptyline  (ELAVIL ) 25 MG tablet Take 1 tablet (25 mg total) by mouth at bedtime. (Patient not taking: Reported on 11/23/2023) 30 tablet 3   aspirin 81 MG chewable tablet Chew 81 mg by mouth daily.     clotrimazole  (MYCELEX ) 10 MG troche Suck through 1 troche 5 times daily for 14 consecutive days (Patient not taking: Reported on 11/23/2023) 70 Troche 0   dexamethasone  (  DEXAMETHASONE  INTENSOL) 1 MG/ML solution TAKE BY MOUTH OR G-TUBE ONCE DAILY FOR THREE DAYS AFTER CHEMO (Patient not taking: Reported on 10/19/2023) 30 mL 0   doxazosin  (CARDURA ) 1 MG tablet TAKE ONE TABLET BY MOUTH AT BEDTIME 30 tablet 1   esomeprazole  (NEXIUM ) 40 MG capsule Take 1 capsule (40 mg total) as directed daily at 12 noon. See instructions to administer through Tube. 30 capsule 0    fentaNYL  (DURAGESIC ) 50 MCG/HR Place 1 patch onto the skin every 3 (three) days. (Patient not taking: Reported on 11/23/2023) 10 patch 0   fluconazole  (DIFLUCAN ) 40 MG/ML suspension Take 5mL today, then 2.5mL daily for 20 more days. HOLD ATORVASTATIN  WHILE ON THIS. (Patient not taking: Reported on 11/23/2023) 55 mL 0   guaiFENesin (MUCINEX FAST-MAX CHEST CONG MS) 100 MG/5ML liquid Place 20 mLs into feeding tube every 6 (six) hours as needed for cough or to loosen phlegm.     hyoscyamine  (LEVSIN /SL) 0.125 MG SL tablet Place 2 tablets (0.25 mg total) under the tongue every 6 (six) hours as needed. 60 tablet 1   levothyroxine  (SYNTHROID ) 88 MCG tablet TAKE ONE TABLET BY MOUTH DAILY. 90 tablet 2   lidocaine  (XYLOCAINE ) 2 % solution Patient: Mix 1part 2% viscous lidocaine , 1part water . Swish and spit 10mL of diluted mixture every 2 hours as needed. 300 mL 3   lidocaine -prilocaine  (EMLA ) cream Apply to affected area once 30 g 3   magic mouthwash (nystatin , diphenhydrAMINE, alum & mag hydroxide) suspension mixture Take 5 mLs by mouth 3 (three) times daily as directed. 150 mL 3   metoCLOPramide  (REGLAN ) 10 MG/10ML SOLN Place 10 mLs (10 mg total) into feeding tube 3 (three) times daily before meals. (Patient not taking: Reported on 11/23/2023) 900 mL 0   metoCLOPramide  (REGLAN ) 5 MG/5ML solution PLACE INTO FEEDING TUBE THREE TIMES DAILY BEFORE MEALS (Patient not taking: Reported on 11/23/2023) 900 mL 0   Nutritional Supplements (NUTREN 1.5) LIQD 2 cartons Nutren 1.5 (500 ml) QID via tube. Flush with 60 ml water  before/after each bolus. Provide additional 250 ml water  flush 4x/day in between feedings to meet hydration needs. Provides 3000 kcal, 136 g protein, 1528 ml free water  (3008 ml total water ) 2000 ml/day meets 100% DRI     ondansetron  (ZOFRAN ) 8 MG tablet TAKE ONE TABLET BY MOUTH EVERY 8 HOURS AS NEEDED FOR NAUSEA AND VOMITING. START ON THE THIRD DAY AFTER CISPLATIN  30 tablet 1   oxyCODONE  (OXY IR/ROXICODONE )  5 MG immediate release tablet Take 1 tablet (5 mg total) by mouth every 6 (six) hours as needed for severe pain (pain score 7-10). 90 tablet 0   prochlorperazine  (COMPAZINE ) 10 MG tablet Take 1 tablet (10 mg total) by mouth every 6 (six) hours as needed (Nausea or vomiting). 30 tablet 1   solifenacin (VESICARE) 5 MG tablet Take 5 mg by mouth daily.     traZODone  (DESYREL ) 50 MG tablet Take 1 tablet (50 mg total) by mouth at bedtime. (Patient not taking: Reported on 11/23/2023) 30 tablet 1   No current facility-administered medications for this visit.     VITALS:   Blood pressure (!) 100/58, pulse 85, temperature 98.3 F (36.8 C), resp. rate 17, weight 248 lb 4.8 oz (112.6 kg), SpO2 98%.  Wt Readings from Last 3 Encounters:  12/08/23 248 lb 4.8 oz (112.6 kg)  11/23/23 (P) 253 lb 4 oz (114.9 kg)  11/01/23 252 lb 8 oz (114.5 kg)    Body mass index is 32.76 kg/m (  pended).    Onc Performance Status - 12/08/23 1000       ECOG Perf Status   ECOG Perf Status Ambulatory and capable of all selfcare but unable to carry out any work activities.  Up and about more than 50% of waking hours      KPS SCALE   KPS % SCORE Cares for self, unable to carry on normal activity or to do active work              PHYSICAL EXAM:   Physical Exam Constitutional:      General: He is not in acute distress.    Comments: Appears tired.  Presented to clinic in a wheelchair today  HENT:     Head: Normocephalic and atraumatic.     Mouth/Throat:     Comments: Limited exam due to trismus.  Oral candidiasis noted. Eyes:     General: No scleral icterus.    Conjunctiva/sclera: Conjunctivae normal.  Cardiovascular:     Rate and Rhythm: Normal rate and regular rhythm.     Heart sounds: Normal heart sounds.  Pulmonary:     Effort: Pulmonary effort is normal.     Breath sounds: Normal breath sounds.  Chest:     Comments: Right sided Port-A-Cath on place without any signs of infection Abdominal:      General: There is no distension.     Comments: Feeding tube in place  Musculoskeletal:     Right lower leg: No edema.     Left lower leg: No edema.  Neurological:     General: No focal deficit present.     Mental Status: He is alert and oriented to person, place, and time.      LABORATORY DATA:   I have reviewed the data as listed.  Results for orders placed or performed in visit on 12/08/23  Magnesium   Result Value Ref Range   Magnesium  1.8 1.7 - 2.4 mg/dL  CMP (Cancer Center only)  Result Value Ref Range   Sodium 138 135 - 145 mmol/L   Potassium 3.8 3.5 - 5.1 mmol/L   Chloride 101 98 - 111 mmol/L   CO2 31 22 - 32 mmol/L   Glucose, Bld 122 (H) 70 - 99 mg/dL   BUN 16 8 - 23 mg/dL   Creatinine 9.56 (L) 9.38 - 1.24 mg/dL   Calcium  9.0 8.9 - 10.3 mg/dL   Total Protein 6.0 (L) 6.5 - 8.1 g/dL   Albumin 3.8 3.5 - 5.0 g/dL   AST 17 15 - 41 U/L   ALT 10 0 - 44 U/L   Alkaline Phosphatase 72 38 - 126 U/L   Total Bilirubin 0.6 0.0 - 1.2 mg/dL   GFR, Estimated >39 >39 mL/min   Anion gap 6 5 - 15  CBC with Differential (Cancer Center Only)  Result Value Ref Range   WBC Count 3.6 (L) 4.0 - 10.5 K/uL   RBC 3.52 (L) 4.22 - 5.81 MIL/uL   Hemoglobin 11.2 (L) 13.0 - 17.0 g/dL   HCT 66.6 (L) 60.9 - 47.9 %   MCV 94.6 80.0 - 100.0 fL   MCH 31.8 26.0 - 34.0 pg   MCHC 33.6 30.0 - 36.0 g/dL   RDW 86.4 88.4 - 84.4 %   Platelet Count 167 150 - 400 K/uL   nRBC 0.0 0.0 - 0.2 %   Neutrophils Relative % 72 %   Neutro Abs 2.6 1.7 - 7.7 K/uL   Lymphocytes Relative 14 %   Lymphs  Abs 0.5 (L) 0.7 - 4.0 K/uL   Monocytes Relative 11 %   Monocytes Absolute 0.4 0.1 - 1.0 K/uL   Eosinophils Relative 3 %   Eosinophils Absolute 0.1 0.0 - 0.5 K/uL   Basophils Relative 0 %   Basophils Absolute 0.0 0.0 - 0.1 K/uL   Immature Granulocytes 0 %   Abs Immature Granulocytes 0.01 0.00 - 0.07 K/uL  TSH  Result Value Ref Range   TSH 3.400 0.350 - 4.500 uIU/mL    RADIOGRAPHIC STUDIES:  I have personally  reviewed the radiological images as listed and agree with the findings in the report.  NM PET Image Restag (PS) Skull Base To Thigh Result Date: 11/22/2023 CLINICAL DATA:  Subsequent treatment strategy for head/neck cancer. EXAM: NUCLEAR MEDICINE PET SKULL BASE TO THIGH TECHNIQUE: 12.0 mCi F-18 FDG was injected intravenously. Full-ring PET imaging was performed from the skull base to thigh after the radiotracer. CT data was obtained and used for attenuation correction and anatomic localization. Fasting blood glucose: 142 mg/dl COMPARISON:  97/82/7974. FINDINGS: Mediastinal blood pool activity: SUV max 3.5 Liver activity: SUV max NA NECK: Mild residual right oropharyngeal soft tissue thickening and hypermetabolism, markedly decreased with SUV max 5.2, previously 31.7. No hypermetabolic lymph nodes. Incidental CT findings: None. CHEST: No abnormal hypermetabolism. Incidental CT findings: Right IJ Port-A-Cath terminates at the SVC RA junction. Atherosclerotic calcification of the aorta, aortic valve and coronary arteries. Heart is enlarged. No pericardial effusion. Minimal vague subpleural consolidation in the apical left upper lobe (7/12), new and likely infectious/inflammatory in etiology. No pleural effusion. ABDOMEN/PELVIS: No abnormal hypermetabolism. Incidental CT findings: Tiny right renal stones. Percutaneous gastrostomy. Radiotherapy seeds in the prostate. SKELETON: No abnormal hypermetabolism. Incidental CT findings: Degenerative changes in the spine. IMPRESSION: 1. Interval response to therapy as evidenced by marked decrease in hypermetabolism and soft tissue thickening associated with a right oropharyngeal mass, without complete resolution. No evidence of metastatic disease. 2. Tiny right renal stones. 3. Aortic atherosclerosis (ICD10-I70.0). Coronary artery calcification. Electronically Signed   By: Newell Eke M.D.   On: 11/22/2023 15:02     CODE STATUS:  Code Status History     Date Active  Date Inactive Code Status Order ID Comments User Context   06/18/2023 1400 06/19/2023 0508 Full Code [id number]  Philip Cornet, MD HOV    Questions for Most Recent Historical Code Status (Order [id number])     Question Answer   By: Consent: discussion documented in EHR            No orders of the defined types were placed in this encounter.    Future Appointments  Date Time Provider Department Center  12/09/2023 11:15 AM Ivonne Harlene RAMAN, RD [facility] None  12/28/2023  9:00 AM Wyatt Leeroy HERO, PA-C [facility] None  01/04/2024  9:30 AM Jacelyn Lupita NOVAK, CCC-SLP [facility]  01/19/2024  9:00 AM [facility] FLUSH [facility] None  01/19/2024  9:30 AM Marji Kuehnel, Chinita, MD [facility] None  04/06/2024  9:45 AM Okey Burns, MD [facility] None     This document was completed utilizing speech recognition software. Grammatical errors, random word insertions, pronoun errors, and incomplete sentences are an occasional consequence of this system due to software limitations, ambient noise, and hardware issues. Any formal questions or concerns about the content, text or information contained within the body of this dictation should be directly addressed to the provider for clarification.

## 2023-12-08 NOTE — Telephone Encounter (Signed)
 Spoke with patient's wife on phone to inform that some Magic Mouthwash was sent to [facility] per patient and Dr. Clovis request.

## 2023-12-08 NOTE — Telephone Encounter (Signed)
 Magic Mouth Wash ordered for pt today and sent to [facility]

## 2023-12-08 NOTE — Assessment & Plan Note (Signed)
 Please review oncology history for additional details and timeline of events.  Symptoms include severe pain and dysphagia since mid-November, progressively worsening.   cT3,cN2,cM0,p16+ tumor, Stage II disease.   His case was discussed in tumor conference on 06/09/2023.  Given the extent of disease, consensus opinion is to proceed with concurrent chemoradiation. We have discussed about role of cisplatin  being a radiosensitizer in the treatment of head and neck cancer.  We have discussed about the curative intent of chemoradiation for this patient.  Patient was willing to proceed with weekly cisplatin  after discussing risk versus benefits and side effect profile.  He started cycle 1 of cisplatin  at a dose of 40 mg/m on 07/09/2023.  Plan was to continue cisplatin  weekly during the course of radiation.  He began radiation treatments from 07/12/2023.  During week 2, chemotherapy had to be held because of severe fatigue with ECOG performance status of 3.  He received total of 4 cycles of Cisplatin . Last cisplatin  was on 08/06/23. Stopped early because of side effects including neutropenia/ leukopenia, fatigue, nausea, weight loss.   Restaging PET scan on 11/16/2023 showed overall improvement in disease without evidence of disease progression.  His case was discussed in tumor conference earlier today and consensus opinion is that there is no evidence of disease.  Will continue surveillance as per NCCN guidelines.

## 2023-12-09 ENCOUNTER — Inpatient Hospital Stay: Admitting: Dietician

## 2023-12-09 ENCOUNTER — Telehealth: Payer: Self-pay | Admitting: Dietician

## 2023-12-09 NOTE — Telephone Encounter (Signed)
 Nutrition Follow-up:  Pt with stage II SCC of right tonsil, p16 positive. He completed concurrent chemoradiation (first chemo 3/21; final RT 5/16). Patient is under the care of Dr. Izell and Dr. Autumn.    S/p Gtube 2/28 - replaced at [facility] with non Enfit FT 5/28 DME: Alfreda Sermon with wife of pt via telephone for nutrition follow-up. Reports pt had started to eat and drink more until recurrent thrush. He had cute back on feedings to encourage increased po. Recalls giving 1 1/2 cartons at breakfast, one carton at lunch, and one at dinner depending on amount of oral intake.   Wife has increased feedings (5-6 cartons/day) as swallowing is very painful due to infection. Wife says he did not complete last round of medications for thrush. He did not like taste of lozenges prescribed. Patient did take 3 clotrimazole  lozenges before bed last night. Wife says patient agreeable to take medications as prescribed. He wants to eat. He continues daily swallowing exercises. Wife reports concern regarding lymphedema. She has not been able to locate exercises given by PT. Currently awaiting call to reschedule PT appointment.   Denies nausea or vomiting. Bowels moving regularly with miralax  every other day.    Medications: MMW  Labs: 8/20 - glucose 122, Cr 0.43  Anthropometrics: Wt 248 lb 4.8 oz 8/20 decreased from 253 lb 4 oz on 8/5 - 2% in 2 weeks which is clinically significant  NUTRITION DIAGNOSIS: Unintended wt loss continues    INTERVENTION:  Continue clotrimazole  lozenges (5/day) + MMW as prescribed per MD Increase TF as tolerated - 6 cartons Nutren 1.5 Encourage oral intake as tolerated  Continue daily HEP Will continue assessing readiness to wean from tube     MONITORING, EVALUATION, GOAL: wt trends, oral intake, TF   NEXT VISIT: Tuesday September 9 in office following PA-C

## 2023-12-14 ENCOUNTER — Encounter: Payer: Self-pay | Admitting: Physical Therapy

## 2023-12-14 ENCOUNTER — Ambulatory Visit: Attending: Radiation Oncology | Admitting: Physical Therapy

## 2023-12-14 ENCOUNTER — Ambulatory Visit: Payer: Self-pay | Admitting: Radiation Oncology

## 2023-12-14 DIAGNOSIS — R293 Abnormal posture: Secondary | ICD-10-CM | POA: Diagnosis not present

## 2023-12-14 DIAGNOSIS — I890 Lymphedema, not elsewhere classified: Secondary | ICD-10-CM | POA: Diagnosis not present

## 2023-12-14 DIAGNOSIS — C090 Malignant neoplasm of tonsillar fossa: Secondary | ICD-10-CM | POA: Diagnosis not present

## 2023-12-14 NOTE — Therapy (Signed)
 OUTPATIENT PHYSICAL THERAPY HEAD AND NECK POST RADIATION FOLLOW UP   Patient Name: Isaac Carpenter MRN: [id number] DOB:16-Aug-1950, 73 y.o., male Today's Date: 12/14/2023  END OF SESSION:  PT End of Session - 12/14/23 1515     Visit Number 2    Number of Visits 10    Date for PT Re-Evaluation 01/11/24    PT Start Time 1507    PT Stop Time 1552    PT Time Calculation (min) 45 min    Activity Tolerance Patient tolerated treatment well    Behavior During Therapy WFL for tasks assessed/performed          Past Medical History:  Diagnosis Date   Arthritis    Cancer (HCC)    History of partial thyroidectomy STATES OVERACTIVE THYROID -- NO ISSUES SINCE AGE 89 AND NO MEDS   Hyperlipidemia    Hypertension    Hypothyroidism    Sleep apnea    Per patient he tried CPAP but could not tolerate.   Thyroid  disease    Past Surgical History:  Procedure Laterality Date   CIRCUMCISION  01/04/2012   Procedure: CIRCUMCISION ADULT;  Surgeon: Arlena LILLETTE Gal, MD;  Location: [facility];  Service: Urology;  Laterality: N/A;   COLONOSCOPY  04/08/2020   Vito Cirigliano   CYSTOSCOPY  09/04/2022   Procedure: CYSTOSCOPY;  Surgeon: Matilda Senior, MD;  Location: [facility];  Service: Urology;;   IR GASTROSTOMY TUBE MOD SED  06/18/2023   IR IMAGING GUIDED PORT INSERTION  06/18/2023   RADIOACTIVE SEED IMPLANT N/A 09/04/2022   Procedure: RADIOACTIVE SEED IMPLANT/BRACHYTHERAPY IMPLANT;  Surgeon: Matilda Senior, MD;  Location: [facility];  Service: Urology;  Laterality: N/A;  90 MINS   SPACE OAR INSTILLATION N/A 09/04/2022   Procedure: SPACE OAR INSTILLATION;  Surgeon: Matilda Senior, MD;  Location: [facility];  Service: Urology;  Laterality: N/A;   THYROIDECTOMY, PARTIAL  AGE 89   OVERACTIVE THYROID    Patient Active Problem List   Diagnosis Date Noted   Cancer (HCC) 08/14/2023   Febrile neutropenia (HCC) 08/14/2023   S/P  percutaneous endoscopic gastrostomy (PEG) tube placement (HCC) 08/14/2023   Hyponatremia 08/14/2023   Normocytic anemia 08/14/2023   Chemotherapy induced neutropenia (HCC) 07/30/2023   Fatigue 07/16/2023   Situational anxiety 07/09/2023   Encounter for antineoplastic chemotherapy 07/09/2023   Dysphagia 07/09/2023   Port-A-Cath in place 07/05/2023   Sensorineural hearing loss (SNHL) of right ear 06/23/2023   Cancer associated pain 06/11/2023   Coordination of complex care 06/11/2023   Malignant neoplasm of tonsillar fossa (HCC) 06/08/2023   Malignant neoplasm of prostate (HCC) 07/17/2022   Hyperlipidemia 05/11/2021   Morbid obesity (HCC)-BMI 37.8 with DM II,HLD,OSA,HTN 05/09/2021   Hypertension, essential, benign 07/23/2020   OSA (obstructive sleep apnea) 07/23/2020   Type 2 diabetes mellitus with other specified complication (HCC) 01/31/2020   BPH associated with nocturia 01/31/2020   Hypothyroidism, postsurgical 01/31/2020    PCP: Betty [name], MD   REFERRING PROVIDER: Lauraine Golden, MD   REFERRING DIAG: C09.0 (ICD-10-CM) - Malignant neoplasm of tonsillar fossa (HCC)   THERAPY DIAG:  Lymphedema, not elsewhere classified  Abnormal posture  Malignant neoplasm of tonsillar fossa (HCC)  Rationale for Evaluation and Treatment: Rehabilitation  ONSET DATE: 05/27/23  SUBJECTIVE:  SUBJECTIVE STATEMENT: 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 I have not been able to eat. I can not even get to the point of swallowing it. In the last week I have not had a problem with the phlegm at night but when I get up it sounds like my throat is full of phlegm and now I can not get it up.   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 He presented to his PCP on  05/17/23 with complaints of right sided jaw/maxillary pain when coughing. 05/17/23 He underwent a CT neck which revealed an ulcerated enhancing mass involving the superior oropharynx and soft palate/uvula measuring 3.4 x 3.0 x 3.6 cm in the greatest extent that is concerning for malignancy. No evidence of cervical lymphadenopathy was indicated on scan. 05/27/23 He saw Dr. Soldatova. She completed a laryngoscopy with biopsies which revealed Invasive moderately differentiated SCC, p 16 +. 06/04/23 visit with Dr. Soldatova for worsening symptoms of odynophagia and dysphagia. He was unable to maintain adequate nutrition. 06/07/23 PET revealing a right oropharyngeal tumor >4cm, likely b/l level II adenopathy, no distant metastases. He will receive 35 fractions of radiation to his right Tonsil and bilateral neck with weekly chemotherapy which started on 07/12/23 and will complete 08/27/23. PEG/PAC 2/28/25MBSS 06/28/23. When he presented for consult he had severe dysphagia and PEG was placed quickly. He did recover from the dysphagia (patient thinks it was related to biopsy pain).   PATIENT GOALS:  Reassess how my recovery is going related to neck ROM, cervical pain, fatigue, and swelling.  PAIN:  Are you having pain? No  PRECAUTIONS: Recent radiation, Head and neck lymphedema risk,    OBJECTIVE:   POSTURE:  Forward head and rounded shoulders posture  30 SEC SIT TO STAND: 12/14/23: 10 reps in 30 sec without use of UEs which is below average for patient's age At eval 07/22/23: 10 reps in 30 sec without use of UEs which is  Below average for patient's age.   SHOULDER AROM:   Limited bilaterally but functional      CERVICAL AROM:     Percent limited 07/22/23 eval 12/14/23  Flexion WFL 25% limited  Extension 50% limited 50% limited  Right lateral flexion 75% limited 75% limited  Left lateral flexion 75% limited 75% limited  Right rotation 50% limited 50% limited  Left rotation 50% limited 50% limited                           (Blank rows=not tested)  LYMPHEDEMA ASSESSMENT:    Circumference in cm  4 cm superior to sternal notch around neck 47  6 cm superior to sternal notch around neck 48  8 cm superior to sternal notch around neck 48.5  R lateral nostril from base of nose to medial tragus   L lateral nostril from base of nose to medial tragus   R corner of mouth to where ear lobe meets face   L corner of mouth to where ear lobe meets face         (Blank rows=not tested)   OTHER SYMPTOMS: Pain No Fibrosis Yes Pitting edema Yes Infections No Decreased scar mobility No  PATIENT EDUCATION:  Education details: anatomy and physiology of the lymphatic system, how lymphedema is managed through self MLD and compression, head and neck tribute Radiation protection practitioner educated: Patient and Spouse Education method: Explanation and Demonstration Education comprehension: verbalized  understanding  HOME EXERCISE PROGRAM: Reviewed previously given post op HEP. Wear chip pack at least 4 to 5 hours per day  ASSESSMENT:  CLINICAL IMPRESSION: Pt returns to PT after completing chemo and radiation for treatment of tonsillar cancer. He reports he has had a difficult time recovering. He is still unable to eat and is relying on his feeding tube. He has difficulty swallowing. He has lymphedema at his anterior throat with fibrosis present. His neck ROM is the same as baseline which is limited in all directions. His neck flexion was [facility] at baseline but now is limited possibly due to his lymphedema. He would benefit from skilled PT services to decrease anterior neck lymphedema to allow improved swallowing and decrease risk of infection and to improve cervical flexion ROM and posture.   Pt will benefit from skilled therapeutic intervention to improve on the following deficits: decreased knowledge of condition, decreased knowledge of use of DME, decreased ROM, increased edema, increased fascial restrictions, and postural  dysfunction  PT treatment/interventions: ADL/Self care home management, [id number]- PT Re-evaluation, 97110-Therapeutic exercises, 97530- Therapeutic activity, [id number]- Neuromuscular re-education, 97535- Self Care, 02859- Manual therapy, [id number]- Orthotic Initial, and [id number]- Orthotic/Prosthetic subsequent     GOALS: Goals reviewed with patient? Yes  LONG TERM GOALS:  (STG=LTG)  GOALS Name Target Date  Goal status  1 Pt will demonstrate a return to baseline cervical ROM measurements and not demonstrate any signs or symptoms of lymphedema. Baseline: 01/11/24 INITIAL  2 Pt and/or spouse will be independent in self MLD for long term management of lymphedema. 01/11/24  NEW  3 Pt will obtain a compression garment for long term management of lymphedema. 01/11/24  NEW  4 Pt will demonstrate a 1 cm decrease in edema at 8 cm superior to sternal notch to decrease risk of infection. 01/11/24  NEW       PLAN:  PT FREQUENCY/DURATION: 2x/wk for 4 wks  PLAN FOR NEXT SESSION: instruct in self MLD and give handout, did they order garment?   [facility]  [address]  [place] [address]  [phone]   Scar massage You can begin gentle scar massage to you incision sites. Gently place one hand on the incision and move the skin (without sliding on the skin) in various directions. Do this for a few minutes and then you can gently massage either coconut oil or vitamin E cream into the scars.  Home exercise Program Continue doing the exercises you were given until you feel like you can do them without feeling any tightness at the end. It is best to do them for several months after completion of radiation since the effects of radiation continue past completion.   Walking Program Studies show that 30 minutes of walking per day (fast enough to elevate your heart rate) can significantly reduce the risk of a cancer recurrence. If you can't walk due to other medical reasons, we  encourage you to find another activity you could do (like a stationary bike or water  exercise).  Posture After treatment for head and neck cancer, people frequently sit with rounded shoulders and forward head posture because the front of the neck has become tight and it feels better. If you sit like this, you can become very tight and have pain in sitting or standing with good posture. Try to be aware of your posture and sit and stand up tall to heal properly.  Follow up PT: Please let you doctor know as soon as  possible if you develop any swelling in your face or neck in the future. Lymphedema (swelling) can occur months after completion of radiation. The sooner you can let the doctor know, the sooner they can refer you back to PT. It is much easier to treat the swelling early on.   [address] [place], [place] 12/14/2023, 4:03 PM

## 2023-12-16 ENCOUNTER — Ambulatory Visit

## 2023-12-16 DIAGNOSIS — I890 Lymphedema, not elsewhere classified: Secondary | ICD-10-CM | POA: Diagnosis not present

## 2023-12-16 DIAGNOSIS — C090 Malignant neoplasm of tonsillar fossa: Secondary | ICD-10-CM

## 2023-12-16 DIAGNOSIS — R293 Abnormal posture: Secondary | ICD-10-CM | POA: Diagnosis not present

## 2023-12-16 NOTE — Therapy (Signed)
 OUTPATIENT PHYSICAL THERAPY HEAD AND NECK POST RADIATION TREATMENT   Patient Name: Isaac Carpenter MRN: [id number] DOB:10/08/1950, 73 y.o., male Today's Date: 12/16/2023  END OF SESSION:  PT End of Session - 12/16/23 0959     Visit Number 3    Number of Visits 10    Date for PT Re-Evaluation 01/11/24    PT Start Time 1000    PT Stop Time 1055    PT Time Calculation (min) 55 min    Activity Tolerance Patient tolerated treatment well    Behavior During Therapy WFL for tasks assessed/performed          Past Medical History:  Diagnosis Date   Arthritis    Cancer (HCC)    History of partial thyroidectomy STATES OVERACTIVE THYROID -- NO ISSUES SINCE AGE 62 AND NO MEDS   Hyperlipidemia    Hypertension    Hypothyroidism    Sleep apnea    Per patient he tried CPAP but could not tolerate.   Thyroid  disease    Past Surgical History:  Procedure Laterality Date   CIRCUMCISION  01/04/2012   Procedure: CIRCUMCISION ADULT;  Surgeon: Arlena LILLETTE Gal, MD;  [facility]: [facility];  Service: Urology;  Laterality: N/A;   COLONOSCOPY  04/08/2020   Vito Cirigliano   CYSTOSCOPY  09/04/2022   Procedure: CYSTOSCOPY;  Surgeon: Matilda Senior, MD;  Location: [facility];  Service: Urology;;   IR GASTROSTOMY TUBE MOD SED  06/18/2023   IR IMAGING GUIDED PORT INSERTION  06/18/2023   RADIOACTIVE SEED IMPLANT N/A 09/04/2022   Procedure: RADIOACTIVE SEED IMPLANT/BRACHYTHERAPY IMPLANT;  Surgeon: Matilda Senior, MD;  Location: [facility];  Service: Urology;  Laterality: N/A;  90 MINS   SPACE OAR INSTILLATION N/A 09/04/2022   Procedure: SPACE OAR INSTILLATION;  Surgeon: Matilda Senior, MD;  Location: [facility];  Service: Urology;  Laterality: N/A;   THYROIDECTOMY, PARTIAL  AGE 62   OVERACTIVE THYROID    Patient Active Problem List   Diagnosis Date Noted   Cancer (HCC) 08/14/2023   Febrile neutropenia (HCC) 08/14/2023   S/P  percutaneous endoscopic gastrostomy (PEG) tube placement (HCC) 08/14/2023   Hyponatremia 08/14/2023   Normocytic anemia 08/14/2023   Chemotherapy induced neutropenia (HCC) 07/30/2023   Fatigue 07/16/2023   Situational anxiety 07/09/2023   Encounter for antineoplastic chemotherapy 07/09/2023   Dysphagia 07/09/2023   Port-A-Cath in place 07/05/2023   Sensorineural hearing loss (SNHL) of right ear 06/23/2023   Cancer associated pain 06/11/2023   Coordination of complex care 06/11/2023   Malignant neoplasm of tonsillar fossa (HCC) 06/08/2023   Malignant neoplasm of prostate (HCC) 07/17/2022   Hyperlipidemia 05/11/2021   Morbid obesity (HCC)-BMI 37.8 with DM II,HLD,OSA,HTN 05/09/2021   Hypertension, essential, benign 07/23/2020   OSA (obstructive sleep apnea) 07/23/2020   Type 2 diabetes mellitus with other specified complication (HCC) 01/31/2020   BPH associated with nocturia 01/31/2020   Hypothyroidism, postsurgical 01/31/2020    PCP: Betty [name], MD   REFERRING PROVIDER: Lauraine Golden, MD   REFERRING DIAG: C09.0 (ICD-10-CM) - Malignant neoplasm of tonsillar fossa (HCC)   THERAPY DIAG:  Lymphedema, not elsewhere classified  Abnormal posture  Malignant neoplasm of tonsillar fossa (HCC)  Rationale for Evaluation and Treatment: Rehabilitation  ONSET DATE: 05/27/23  SUBJECTIVE:  SUBJECTIVE STATEMENT:  Pts wife tried to order the garment but she can't get the site to open. I can't tell any difference with the chip pack. I wore it 5 hours yesterday and the day she made it. The ridge that was firm on his neck looks better per his wife [name].  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 I have not been able to eat. I can not even get to the point of swallowing it. In the last week I have not had a problem with  the phlegm at night but when I get up it sounds like my throat is full of phlegm and now I can not get it up.   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 He presented to his PCP on 05/17/23 with complaints of right sided jaw/maxillary pain when coughing. 05/17/23 He underwent a CT neck which revealed an ulcerated enhancing mass involving the superior oropharynx and soft palate/uvula measuring 3.4 x 3.0 x 3.6 cm in the greatest extent that is concerning for malignancy. No evidence of cervical lymphadenopathy was indicated on scan. 05/27/23 He saw Dr. Soldatova. She completed a laryngoscopy with biopsies which revealed Invasive moderately differentiated SCC, p 16 +. 06/04/23 visit with Dr. Soldatova for worsening symptoms of odynophagia and dysphagia. He was unable to maintain adequate nutrition. 06/07/23 PET revealing a right oropharyngeal tumor >4cm, likely b/l level II adenopathy, no distant metastases. He will receive 35 fractions of radiation to his right Tonsil and bilateral neck with weekly chemotherapy which started on 07/12/23 and will complete 08/27/23. PEG/PAC 2/28/25MBSS 06/28/23. When he presented for consult he had severe dysphagia and PEG was placed quickly. He did recover from the dysphagia (patient thinks it was related to biopsy pain).   PATIENT GOALS:  Reassess how my recovery is going related to neck ROM, cervical pain, fatigue, and swelling.  PAIN:  Are you having pain? No  PRECAUTIONS: Recent radiation, Head and neck lymphedema risk,    OBJECTIVE:   POSTURE:  Forward head and rounded shoulders posture  30 SEC SIT TO STAND: 12/14/23: 10 reps in 30 sec without use of UEs which is below average for patient's age At eval 07/22/23: 10 reps in 30 sec without use of UEs which is  Below average for patient's age.   SHOULDER AROM:   Limited bilaterally but functional      CERVICAL AROM:     Percent limited 07/22/23 eval  12/14/23  Flexion WFL 25% limited  Extension 50% limited 50% limited  Right lateral flexion 75% limited 75% limited  Left lateral flexion 75% limited 75% limited  Right rotation 50% limited 50% limited  Left rotation 50% limited 50% limited                          (Blank rows=not tested)  LYMPHEDEMA ASSESSMENT:    Circumference in cm  4 cm superior to sternal notch around neck 47  6 cm superior to sternal notch around neck 48  8 cm superior to sternal notch around neck 48.5  R lateral nostril from base of nose to medial tragus   L lateral nostril from base of nose to medial tragus   R corner of mouth to where ear lobe meets face   L corner of mouth to where ear lobe meets face         (Blank rows=not tested)   OTHER SYMPTOMS:  Pain No Fibrosis Yes Pitting edema Yes Infections No Decreased scar mobility No    TREATMENT TODAY   12/16/2023  Assisted pts wife with ordering garment on [employer] since she couldn't get the site to come up on her phone; Gave print out of order Educated pt and his wife on lymphatics, and the gentle nature of technique and role of MLD in decreasing edema. Therapist performed [facility] anterior approach handout as follows with caution on right due to portacath: short neck, 5 diaphragmatic breaths, bilateral axillary nodes, bilateral pectoral nodes, anterior chest, short neck, posterior neck moving fluid towards pathway aimed at lateral neck, then lateral and anterior neck moving fluid towards pathway aimed at lateral neck then retracing all steps. Pts wife observed and then practiced MLD to left supraclavicular region only due to portacath, axillary and pectoral LN's, chest and briefly to neck region. She was given a written handout.   PATIENT EDUCATION:  Education details: anatomy and physiology of the lymphatic system, how lymphedema is managed through self MLD and compression, head and neck tribute Radiation protection practitioner educated: Patient and Spouse Education  method: Medical illustrator Education comprehension: verbalized understanding  HOME EXERCISE PROGRAM: Reviewed previously given post op HEP. Wear chip pack at least 4 to 5 hours per day  ASSESSMENT:  CLINICAL IMPRESSION: Pts wife was assisted in ordering [employer] Tribute Head and Neck garment on [employer]. Initiated MLD to pts neck and briefly instructed pts wife in proper technique. She will need further review.   EVAL Pt returns to PT after completing chemo and radiation for treatment of tonsillar cancer. He reports he has had a difficult time recovering. He is still unable to eat and is relying on his feeding tube. He has difficulty swallowing. He has lymphedema at his anterior throat with fibrosis present. His neck ROM is the same as baseline which is limited in all directions. His neck flexion was [facility] at baseline but now is limited possibly due to his lymphedema. He would benefit from skilled PT services to decrease anterior neck lymphedema to allow improved swallowing and decrease risk of infection and to improve cervical flexion ROM and posture.   Pt will benefit from skilled therapeutic intervention to improve on the following deficits: decreased knowledge of condition, decreased knowledge of use of DME, decreased ROM, increased edema, increased fascial restrictions, and postural dysfunction  PT treatment/interventions: ADL/Self care home management, [id number]- PT Re-evaluation, 97110-Therapeutic exercises, 97530- Therapeutic activity, [id number]- Neuromuscular re-education, 97535- Self Care, 02859- Manual therapy, [id number]- Orthotic Initial, and [id number]- Orthotic/Prosthetic subsequent     GOALS: Goals reviewed with patient? Yes  LONG TERM GOALS:  (STG=LTG)  GOALS Name Target Date  Goal status  1 Pt will demonstrate a return to baseline cervical ROM measurements and not demonstrate any signs or symptoms of lymphedema. Baseline: 01/11/24 INITIAL  2 Pt and/or spouse will be independent  in self MLD for long term management of lymphedema. 01/11/24  NEW  3 Pt will obtain a compression garment for long term management of lymphedema. 01/11/24  NEW  4 Pt will demonstrate a 1 cm decrease in edema at 8 cm superior to sternal notch to decrease risk of infection. 01/11/24  NEW       PLAN:  PT FREQUENCY/DURATION: 2x/wk for 4 wks  PLAN FOR NEXT SESSION: I Review MLD with pt/wife, MLD and give handout, did they order garment?   [facility]  [address]  [place] [address]  [phone]   Scar massage  You can begin gentle scar massage to you incision sites. Gently place one hand on the incision and move the skin (without sliding on the skin) in various directions. Do this for a few minutes and then you can gently massage either coconut oil or vitamin E cream into the scars.  Home exercise Program Continue doing the exercises you were given until you feel like you can do them without feeling any tightness at the end. It is best to do them for several months after completion of radiation since the effects of radiation continue past completion.   Walking Program Studies show that 30 minutes of walking per day (fast enough to elevate your heart rate) can significantly reduce the risk of a cancer recurrence. If you can't walk due to other medical reasons, we encourage you to find another activity you could do (like a stationary bike or water  exercise).  Posture After treatment for head and neck cancer, people frequently sit with rounded shoulders and forward head posture because the front of the neck has become tight and it feels better. If you sit like this, you can become very tight and have pain in sitting or standing with good posture. Try to be aware of your posture and sit and stand up tall to heal properly.  Follow up PT: Please let you doctor know as soon as possible if you develop any swelling in your face or neck in the future. Lymphedema  (swelling) can occur months after completion of radiation. The sooner you can let the doctor know, the sooner they can refer you back to PT. It is much easier to treat the swelling early on.   Grayce JINNY Sheldon, PT 12/16/2023, 1:31 PM

## 2023-12-22 ENCOUNTER — Ambulatory Visit: Attending: Radiology | Admitting: Physical Therapy

## 2023-12-22 ENCOUNTER — Encounter: Payer: Self-pay | Admitting: Physical Therapy

## 2023-12-22 DIAGNOSIS — R059 Cough, unspecified: Secondary | ICD-10-CM | POA: Diagnosis not present

## 2023-12-22 DIAGNOSIS — R293 Abnormal posture: Secondary | ICD-10-CM | POA: Diagnosis not present

## 2023-12-22 DIAGNOSIS — I890 Lymphedema, not elsewhere classified: Secondary | ICD-10-CM | POA: Diagnosis not present

## 2023-12-22 DIAGNOSIS — C090 Malignant neoplasm of tonsillar fossa: Secondary | ICD-10-CM | POA: Diagnosis not present

## 2023-12-22 NOTE — Therapy (Signed)
 OUTPATIENT PHYSICAL THERAPY HEAD AND NECK POST RADIATION TREATMENT   Patient Name: Isaac Carpenter MRN: [id number] DOB:1951-02-08, 73 y.o., male Today's Date: 12/22/2023  END OF SESSION:  PT End of Session - 12/22/23 1158     Visit Number 4    Number of Visits 10    Date for PT Re-Evaluation 01/11/24    PT Start Time 1110    PT Stop Time 1155    PT Time Calculation (min) 45 min    Activity Tolerance Patient tolerated treatment well    Behavior During Therapy WFL for tasks assessed/performed          Past Medical History:  Diagnosis Date   Arthritis    Cancer (HCC)    History of partial thyroidectomy STATES OVERACTIVE THYROID -- NO ISSUES SINCE AGE 32 AND NO MEDS   Hyperlipidemia    Hypertension    Hypothyroidism    Sleep apnea    Per patient he tried CPAP but could not tolerate.   Thyroid  disease    Past Surgical History:  Procedure Laterality Date   CIRCUMCISION  01/04/2012   Procedure: CIRCUMCISION ADULT;  Surgeon: Arlena LILLETTE Gal, MD;  Location: [facility];  Service: Urology;  Laterality: N/A;   COLONOSCOPY  04/08/2020   Vito Cirigliano   CYSTOSCOPY  09/04/2022   Procedure: CYSTOSCOPY;  Surgeon: Matilda Senior, MD;  Location: [facility];  Service: Urology;;   IR GASTROSTOMY TUBE MOD SED  06/18/2023   IR IMAGING GUIDED PORT INSERTION  06/18/2023   RADIOACTIVE SEED IMPLANT N/A 09/04/2022   Procedure: RADIOACTIVE SEED IMPLANT/BRACHYTHERAPY IMPLANT;  Surgeon: Matilda Senior, MD;  Location: [facility];  Service: Urology;  Laterality: N/A;  90 MINS   SPACE OAR INSTILLATION N/A 09/04/2022   Procedure: SPACE OAR INSTILLATION;  Surgeon: Matilda Senior, MD;  Location: [facility];  Service: Urology;  Laterality: N/A;   THYROIDECTOMY, PARTIAL  AGE 32   OVERACTIVE THYROID    Patient Active Problem List   Diagnosis Date Noted   Cancer (HCC) 08/14/2023   Febrile neutropenia (HCC) 08/14/2023   S/P  percutaneous endoscopic gastrostomy (PEG) tube placement (HCC) 08/14/2023   Hyponatremia 08/14/2023   Normocytic anemia 08/14/2023   Chemotherapy induced neutropenia (HCC) 07/30/2023   Fatigue 07/16/2023   Situational anxiety 07/09/2023   Encounter for antineoplastic chemotherapy 07/09/2023   Dysphagia 07/09/2023   Port-A-Cath in place 07/05/2023   Sensorineural hearing loss (SNHL) of right ear 06/23/2023   Cancer associated pain 06/11/2023   Coordination of complex care 06/11/2023   Malignant neoplasm of tonsillar fossa (HCC) 06/08/2023   Malignant neoplasm of prostate (HCC) 07/17/2022   Hyperlipidemia 05/11/2021   Morbid obesity (HCC)-BMI 37.8 with DM II,HLD,OSA,HTN 05/09/2021   Hypertension, essential, benign 07/23/2020   OSA (obstructive sleep apnea) 07/23/2020   Type 2 diabetes mellitus with other specified complication (HCC) 01/31/2020   BPH associated with nocturia 01/31/2020   Hypothyroidism, postsurgical 01/31/2020    PCP: Betty [name], MD   REFERRING PROVIDER: Lauraine Golden, MD   REFERRING DIAG: C09.0 (ICD-10-CM) - Malignant neoplasm of tonsillar fossa (HCC)   THERAPY DIAG:  Lymphedema, not elsewhere classified  Abnormal posture  Malignant neoplasm of tonsillar fossa (HCC)  Rationale for Evaluation and Treatment: Rehabilitation  ONSET DATE: 05/27/23  SUBJECTIVE:  SUBJECTIVE STATEMENT: I got the garment. I have mostly been wearing the chip pack.   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 I have not been able to eat. I can not even get to the point of swallowing it. In the last week I have not had a problem with the phlegm at night but when I get up it sounds like my throat is full of phlegm and now I can not get it up.   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 He presented to his PCP on 05/17/23 with complaints of right sided jaw/maxillary pain when coughing. 05/17/23 He underwent a CT neck which revealed an ulcerated enhancing mass involving the superior oropharynx and soft palate/uvula measuring 3.4 x 3.0 x 3.6 cm in the greatest extent that is concerning for malignancy. No evidence of cervical lymphadenopathy was indicated on scan. 05/27/23 He saw Dr. Soldatova. She completed a laryngoscopy with biopsies which revealed Invasive moderately differentiated SCC, p 16 +. 06/04/23 visit with Dr. Soldatova for worsening symptoms of odynophagia and dysphagia. He was unable to maintain adequate nutrition. 06/07/23 PET revealing a right oropharyngeal tumor >4cm, likely b/l level II adenopathy, no distant metastases. He will receive 35 fractions of radiation to his right Tonsil and bilateral neck with weekly chemotherapy which started on 07/12/23 and will complete 08/27/23. PEG/PAC 2/28/25MBSS 06/28/23. When he presented for consult he had severe dysphagia and PEG was placed quickly. He did recover from the dysphagia (patient thinks it was related to biopsy pain).   PATIENT GOALS:  Reassess how my recovery is going related to neck ROM, cervical pain, fatigue, and swelling.  PAIN:  Are you having pain? No  PRECAUTIONS: Recent radiation, Head and neck lymphedema risk,    OBJECTIVE:   POSTURE:  Forward head and rounded shoulders posture  30 SEC SIT TO STAND: 12/14/23: 10 reps in 30 sec without use of UEs which is below average for patient's age At eval 07/22/23: 10 reps in 30 sec without use of UEs which is  Below average for patient's age.   SHOULDER AROM:   Limited bilaterally but functional      CERVICAL AROM:     Percent limited 07/22/23 eval 12/14/23  Flexion WFL 25% limited  Extension 50% limited 50% limited  Right lateral flexion 75% limited 75% limited  Left lateral flexion 75% limited 75% limited  Right rotation 50%  limited 50% limited  Left rotation 50% limited 50% limited                          (Blank rows=not tested)  LYMPHEDEMA ASSESSMENT:    Circumference in cm  4 cm superior to sternal notch around neck 47  6 cm superior to sternal notch around neck 48  8 cm superior to sternal notch around neck 48.5  R lateral nostril from base of nose to medial tragus   L lateral nostril from base of nose to medial tragus   R corner of mouth to where ear lobe meets face   L corner of mouth to where ear lobe meets face         (Blank rows=not tested)   OTHER SYMPTOMS: Pain No Fibrosis Yes Pitting edema Yes Infections No Decreased scar mobility No    TREATMENT TODAY 12/22/23: Assessed fit of tribute head and neck garment. It was a little tight at the back of the neck  so added an extender strap with Velcro and pt reports increased comfort with this.  Instructed pt's spouse in MLD technique using Norton anterior approach as follows: Instructed pt using Norton anterior approach handout as follows: short neck, 5 diaphragmatic breaths, bilateral axillary nodes, bilateral pectoral nodes, anterior chest, short neck, posterior neck moving fluid towards pathway aimed at lateral neck, then lateral and anterior neck moving fluid towards pathway aimed at lateral neck then retracing all steps. Had pt's spouse return demo each step while therapist provided v/c and t/c for correct direction of stretch, sequence and skin stretch technique. After educating pt in entire sequence therapist focused on lymphedema at anterior neck then retraced all steps.    12/16/2023  Assisted pts wife with ordering garment on [employer] since she couldn't get the site to come up on her phone; Gave print out of order Educated pt and his wife on lymphatics, and the gentle nature of technique and role of MLD in decreasing edema. Therapist performed [facility] anterior approach handout as follows with caution on right due to portacath: short neck,  5 diaphragmatic breaths, bilateral axillary nodes, bilateral pectoral nodes, anterior chest, short neck, posterior neck moving fluid towards pathway aimed at lateral neck, then lateral and anterior neck moving fluid towards pathway aimed at lateral neck then retracing all steps. Pts wife observed and then practiced MLD to left supraclavicular region only due to portacath, axillary and pectoral LN's, chest and briefly to neck region. She was given a written handout.   PATIENT EDUCATION:  Education details: anatomy and physiology of the lymphatic system, how lymphedema is managed through self MLD and compression, head and neck tribute Radiation protection practitioner educated: Patient and Spouse Education method: Medical illustrator Education comprehension: verbalized understanding  HOME EXERCISE PROGRAM: Reviewed previously given post op HEP. Wear chip pack at least 4 to 5 hours per day  ASSESSMENT:  CLINICAL IMPRESSION: Educated pt about spouse on donning/doffing tribute head and neck garment. Added extender strap to increased comfort since it was a little too tight around the neck without the extender. Instructed pt's spouse in entire sequence and had her return demo each step. She reports improved understanding at end of session.    EVAL Pt returns to PT after completing chemo and radiation for treatment of tonsillar cancer. He reports he has had a difficult time recovering. He is still unable to eat and is relying on his feeding tube. He has difficulty swallowing. He has lymphedema at his anterior throat with fibrosis present. His neck ROM is the same as baseline which is limited in all directions. His neck flexion was [facility] at baseline but now is limited possibly due to his lymphedema. He would benefit from skilled PT services to decrease anterior neck lymphedema to allow improved swallowing and decrease risk of infection and to improve cervical flexion ROM and posture.   Pt will benefit from  skilled therapeutic intervention to improve on the following deficits: decreased knowledge of condition, decreased knowledge of use of DME, decreased ROM, increased edema, increased fascial restrictions, and postural dysfunction  PT treatment/interventions: ADL/Self care home management, [phone]- PT Re-evaluation, 97110-Therapeutic exercises, 97530- Therapeutic activity, [id number]- Neuromuscular re-education, 97535- Self Care, [id number]- Manual therapy, [id number]- Orthotic Initial, and [id number]- Orthotic/Prosthetic subsequent     GOALS: Goals reviewed with patient? Yes  LONG TERM GOALS:  (STG=LTG)  GOALS Name Target Date  Goal status  1 Pt will demonstrate a return to baseline cervical ROM measurements and not demonstrate any signs or symptoms of  lymphedema. Baseline: 01/11/24 INITIAL  2 Pt and/or spouse will be independent in self MLD for long term management of lymphedema. 01/11/24  NEW  3 Pt will obtain a compression garment for long term management of lymphedema. 01/11/24  NEW  4 Pt will demonstrate a 1 cm decrease in edema at 8 cm superior to sternal notch to decrease risk of infection. 01/11/24  NEW       PLAN:  PT FREQUENCY/DURATION: 2x/wk for 4 wks  PLAN FOR NEXT SESSION: I Review MLD with pt/wife, MLD and give handout, did they order garment?   [facility]  [address], [address]  [place] [address]  [phone]   Scar massage You can begin gentle scar massage to you incision sites. Gently place one hand on the incision and move the skin (without sliding on the skin) in various directions. Do this for a few minutes and then you can gently massage either coconut oil or vitamin E cream into the scars.  Home exercise Program Continue doing the exercises you were given until you feel like you can do them without feeling any tightness at the end. It is best to do them for several months after completion of radiation since the effects of radiation continue past  completion.   Walking Program Studies show that 30 minutes of walking per day (fast enough to elevate your heart rate) can significantly reduce the risk of a cancer recurrence. If you can't walk due to other medical reasons, we encourage you to find another activity you could do (like a stationary bike or water  exercise).  Posture After treatment for head and neck cancer, people frequently sit with rounded shoulders and forward head posture because the front of the neck has become tight and it feels better. If you sit like this, you can become very tight and have pain in sitting or standing with good posture. Try to be aware of your posture and sit and stand up tall to heal properly.  Follow up PT: Please let you doctor know as soon as possible if you develop any swelling in your face or neck in the future. Lymphedema (swelling) can occur months after completion of radiation. The sooner you can let the doctor know, the sooner they can refer you back to PT. It is much easier to treat the swelling early on.   [facility], PT 12/22/2023, 12:03 PM

## 2023-12-23 ENCOUNTER — Ambulatory Visit (HOSPITAL_BASED_OUTPATIENT_CLINIC_OR_DEPARTMENT_OTHER): Admitting: Pulmonary Disease

## 2023-12-23 ENCOUNTER — Ambulatory Visit

## 2023-12-23 DIAGNOSIS — C090 Malignant neoplasm of tonsillar fossa: Secondary | ICD-10-CM | POA: Diagnosis not present

## 2023-12-23 DIAGNOSIS — R293 Abnormal posture: Secondary | ICD-10-CM | POA: Diagnosis not present

## 2023-12-23 DIAGNOSIS — I890 Lymphedema, not elsewhere classified: Secondary | ICD-10-CM

## 2023-12-23 DIAGNOSIS — R059 Cough, unspecified: Secondary | ICD-10-CM | POA: Diagnosis not present

## 2023-12-23 NOTE — Therapy (Signed)
 OUTPATIENT PHYSICAL THERAPY HEAD AND NECK POST RADIATION TREATMENT   Patient Name: Isaac Carpenter MRN: [id number] DOB:[birth year], 73 y.o., male Today's Date: 12/23/2023  END OF SESSION:  PT End of Session - 12/23/23 1258     Visit Number 5    Number of Visits 10    Date for PT Re-Evaluation 01/11/24    PT Start Time 1204    PT Stop Time 1255    PT Time Calculation (min) 51 min    Activity Tolerance Patient tolerated treatment well          Past Medical History:  Diagnosis Date   Arthritis    Cancer (HCC)    History of partial thyroidectomy STATES OVERACTIVE THYROID -- NO ISSUES SINCE AGE 68 AND NO MEDS   Hyperlipidemia    Hypertension    Hypothyroidism    Sleep apnea    Per patient he tried CPAP but could not tolerate.   Thyroid  disease    Past Surgical History:  Procedure Laterality Date   CIRCUMCISION  01/04/2012   Procedure: CIRCUMCISION ADULT;  Surgeon: Arlena LILLETTE Gal, MD;  Location: [facility];  Service: Urology;  Laterality: N/A;   COLONOSCOPY  04/08/2020   Vito Cirigliano   CYSTOSCOPY  09/04/2022   Procedure: CYSTOSCOPY;  Surgeon: Matilda Senior, MD;  Location: [facility];  Service: Urology;;   IR GASTROSTOMY TUBE MOD SED  06/18/2023   IR IMAGING GUIDED PORT INSERTION  06/18/2023   RADIOACTIVE SEED IMPLANT N/A 09/04/2022   Procedure: RADIOACTIVE SEED IMPLANT/BRACHYTHERAPY IMPLANT;  Surgeon: Matilda Senior, MD;  Location: [facility];  Service: Urology;  Laterality: N/A;  90 MINS   SPACE OAR INSTILLATION N/A 09/04/2022   Procedure: SPACE OAR INSTILLATION;  Surgeon: Matilda Senior, MD;  Location: [facility];  Service: Urology;  Laterality: N/A;   THYROIDECTOMY, PARTIAL  AGE 68   OVERACTIVE THYROID    Patient Active Problem List   Diagnosis Date Noted   Cancer (HCC) 08/14/2023   Febrile neutropenia (HCC) 08/14/2023   S/P percutaneous endoscopic gastrostomy (PEG) tube placement (HCC)  08/14/2023   Hyponatremia 08/14/2023   Normocytic anemia 08/14/2023   Chemotherapy induced neutropenia (HCC) 07/30/2023   Fatigue 07/16/2023   Situational anxiety 07/09/2023   Encounter for antineoplastic chemotherapy 07/09/2023   Dysphagia 07/09/2023   Port-A-Cath in place 07/05/2023   Sensorineural hearing loss (SNHL) of right ear 06/23/2023   Cancer associated pain 06/11/2023   Coordination of complex care 06/11/2023   Malignant neoplasm of tonsillar fossa (HCC) 06/08/2023   Malignant neoplasm of prostate (HCC) 07/17/2022   Hyperlipidemia 05/11/2021   Morbid obesity (HCC)-BMI 37.8 with DM II,HLD,OSA,HTN 05/09/2021   Hypertension, essential, benign 07/23/2020   OSA (obstructive sleep apnea) 07/23/2020   Type 2 diabetes mellitus with other specified complication (HCC) 01/31/2020   BPH associated with nocturia 01/31/2020   Hypothyroidism, postsurgical 01/31/2020    PCP: Betty [name], MD   REFERRING PROVIDER: Lauraine Golden, MD   REFERRING DIAG: C09.0 (ICD-10-CM) - Malignant neoplasm of tonsillar fossa (HCC)   THERAPY DIAG:  Lymphedema, not elsewhere classified  Abnormal posture  Malignant neoplasm of tonsillar fossa (HCC)  Rationale for Evaluation and Treatment: Rehabilitation  ONSET DATE: 05/27/23  SUBJECTIVE:  SUBJECTIVE STATEMENT: I brought the new garment. It feels uncomfortable because it feels too tight. It makes me claustrophobic.  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 I have not been able to eat. I can not even get to the point of swallowing it. In the last week I have not had a problem with the phlegm at night but when I get up it sounds like my throat is full of phlegm and now I can not get it up.   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 He presented to his PCP on 05/17/23 with complaints of right sided jaw/maxillary pain when coughing. 05/17/23 He underwent a CT neck which revealed an ulcerated enhancing mass involving the superior oropharynx and soft palate/uvula measuring 3.4 x 3.0 x 3.6 cm in the greatest extent that is concerning for malignancy. No evidence of cervical lymphadenopathy was indicated on scan. 05/27/23 He saw Dr. Soldatova. She completed a laryngoscopy with biopsies which revealed Invasive moderately differentiated SCC, p 16 +. 06/04/23 visit with Dr. Soldatova for worsening symptoms of odynophagia and dysphagia. He was unable to maintain adequate nutrition. 06/07/23 PET revealing a right oropharyngeal tumor >4cm, likely b/l level II adenopathy, no distant metastases. He will receive 35 fractions of radiation to his right Tonsil and bilateral neck with weekly chemotherapy which started on 07/12/23 and will complete 08/27/23. PEG/PAC 2/28/25MBSS 06/28/23. When he presented for consult he had severe dysphagia and PEG was placed quickly. He did recover from the dysphagia (patient thinks it was related to biopsy pain).   PATIENT GOALS:  Reassess how my recovery is going related to neck ROM, cervical pain, fatigue, and swelling.  PAIN:  Are you having pain? No  PRECAUTIONS: Recent radiation, Head and neck lymphedema risk,    OBJECTIVE:   POSTURE:  Forward head and rounded shoulders posture  30 SEC SIT TO STAND: 12/14/23: 10 reps in 30 sec without use of UEs which is below average for patient's age At eval 07/22/23: 10 reps in 30 sec without use of UEs which is  Below average for patient's age.   SHOULDER AROM:   Limited bilaterally but functional      CERVICAL AROM:     Percent limited 07/22/23 eval 12/14/23  Flexion WFL 25% limited  Extension 50% limited 50% limited  Right lateral flexion 75% limited 75% limited  Left lateral flexion 75% limited 75% limited  Right rotation 50% limited 50%  limited  Left rotation 50% limited 50% limited                          (Blank rows=not tested)  LYMPHEDEMA ASSESSMENT:    Circumference in cm  4 cm superior to sternal notch around neck 47  6 cm superior to sternal notch around neck 48  8 cm superior to sternal notch around neck 48.5  R lateral nostril from base of nose to medial tragus   L lateral nostril from base of nose to medial tragus   R corner of mouth to where ear lobe meets face   L corner of mouth to where ear lobe meets face         (Blank rows=not tested)   OTHER SYMPTOMS: Pain No Fibrosis Yes Pitting edema Yes Infections No Decreased scar mobility No    TREATMENT TODAY  12/23/2023 Donned pts Tribute properly and within a minute or 2 he felt claustrophobic. Removed  the head strap and he was able to tolerated the neck part snugged up pretty well. He will try it at home while awake using just the neck part, and we may be able to transition to the head part later.  Norton anterior approach as follows: Therapist performed [facility] anterior approach handout as follows: short neck, 5 diaphragmatic breaths, bilateral axillary nodes, bilateral pectoral nodes, anterior chest, short neck, posterior neck moving fluid towards pathway aimed at lateral neck, then lateral and anterior neck moving fluid towards pathway aimed at lateral neck then retracing all steps. Then had pt's spouse return demo each step of the cervical treatment while therapist provided v/c and t/c for correct direction of stretch, sequence and skin stretch technique. She watched therapist perform sequence in reverse.  12/22/23: Assessed fit of tribute head and neck garment. It was a little tight at the back of the neck so added an extender strap with Velcro and pt reports increased comfort with this.  Instructed pt's spouse in MLD technique using Norton anterior approach as follows: Instructed pt using Norton anterior approach handout as follows: short neck, 5  diaphragmatic breaths, bilateral axillary nodes, bilateral pectoral nodes, anterior chest, short neck, posterior neck moving fluid towards pathway aimed at lateral neck, then lateral and anterior neck moving fluid towards pathway aimed at lateral neck then retracing all steps. Had pt's spouse return demo each step while therapist provided v/c and t/c for correct direction of stretch, sequence and skin stretch technique. After educating pt in entire sequence therapist focused on lymphedema at anterior neck then retraced all steps.    12/16/2023  Assisted pts wife with ordering garment on [employer] since she couldn't get the site to come up on her phone; Gave print out of order Educated pt and his wife on lymphatics, and the gentle nature of technique and role of MLD in decreasing edema. Therapist performed [facility] anterior approach handout as follows with caution on right due to portacath: short neck, 5 diaphragmatic breaths, bilateral axillary nodes, bilateral pectoral nodes, anterior chest, short neck, posterior neck moving fluid towards pathway aimed at lateral neck, then lateral and anterior neck moving fluid towards pathway aimed at lateral neck then retracing all steps. Pts wife observed and then practiced MLD to left supraclavicular region only due to portacath, axillary and pectoral LN's, chest and briefly to neck region. She was given a written handout.   PATIENT EDUCATION:  Education details: anatomy and physiology of the lymphatic system, how lymphedema is managed through self MLD and compression, head and neck tribute Radiation protection practitioner educated: Patient and Spouse Education method: Medical illustrator Education comprehension: verbalized understanding  HOME EXERCISE PROGRAM: Reviewed previously given post op HEP. Wear chip pack at least 4 to 5 hours per day  ASSESSMENT:  CLINICAL IMPRESSION: Pt was too claustrophobic with the tribute over his head, but tolerated the neck part of  the garment fairly snugly and will try this at home without the head strap initially. Pts wife did very well performing all aspects of the neck MLD, with mild difficulty initially at the anterior neck.  EVAL Pt returns to PT after completing chemo and radiation for treatment of tonsillar cancer. He reports he has had a difficult time recovering. He is still unable to eat and is relying on his feeding tube. He has difficulty swallowing. He has lymphedema at his anterior throat with fibrosis present. His neck ROM is the same as baseline which is limited in all directions. His neck flexion was  WFL at baseline but now is limited possibly due to his lymphedema. He would benefit from skilled PT services to decrease anterior neck lymphedema to allow improved swallowing and decrease risk of infection and to improve cervical flexion ROM and posture.   Pt will benefit from skilled therapeutic intervention to improve on the following deficits: decreased knowledge of condition, decreased knowledge of use of DME, decreased ROM, increased edema, increased fascial restrictions, and postural dysfunction  PT treatment/interventions: ADL/Self care home management, [phone]- PT Re-evaluation, 97110-Therapeutic exercises, 97530- Therapeutic activity, [id number]- Neuromuscular re-education, 97535- Self Care, [id number]- Manual therapy, [id number]- Orthotic Initial, and [id number]- Orthotic/Prosthetic subsequent     GOALS: Goals reviewed with patient? Yes  LONG TERM GOALS:  (STG=LTG)  GOALS Name Target Date  Goal status  1 Pt will demonstrate a return to baseline cervical ROM measurements and not demonstrate any signs or symptoms of lymphedema. Baseline: 01/11/24 INITIAL  2 Pt and/or spouse will be independent in self MLD for long term management of lymphedema. 01/11/24  NEW  3 Pt will obtain a compression garment for long term management of lymphedema. 01/11/24  NEW  4 Pt will demonstrate a 1 cm decrease in edema at 8 cm superior to sternal  notch to decrease risk of infection. 01/11/24  NEW       PLAN:  PT FREQUENCY/DURATION: 2x/wk for 4 wks  PLAN FOR NEXT SESSION: Did he try garment without the head strap?Review MLD with pt/wife, MLD and give handout,    [facility]  [address]  [place] [address]  [phone]   Scar massage You can begin gentle scar massage to you incision sites. Gently place one hand on the incision and move the skin (without sliding on the skin) in various directions. Do this for a few minutes and then you can gently massage either coconut oil or vitamin E cream into the scars.  Home exercise Program Continue doing the exercises you were given until you feel like you can do them without feeling any tightness at the end. It is best to do them for several months after completion of radiation since the effects of radiation continue past completion.   Walking Program Studies show that 30 minutes of walking per day (fast enough to elevate your heart rate) can significantly reduce the risk of a cancer recurrence. If you can't walk due to other medical reasons, we encourage you to find another activity you could do (like a stationary bike or water  exercise).  Posture After treatment for head and neck cancer, people frequently sit with rounded shoulders and forward head posture because the front of the neck has become tight and it feels better. If you sit like this, you can become very tight and have pain in sitting or standing with good posture. Try to be aware of your posture and sit and stand up tall to heal properly.  Follow up PT: Please let you doctor know as soon as possible if you develop any swelling in your face or neck in the future. Lymphedema (swelling) can occur months after completion of radiation. The sooner you can let the doctor know, the sooner they can refer you back to PT. It is much easier to treat the swelling early on.   Grayce JINNY Sheldon,  PT 12/23/2023, 1:05 PM

## 2023-12-27 NOTE — Progress Notes (Signed)
 Isaac Carpenter presents to the clinic today to see Leeroy Due PA-C for his 1 month follow up. His last radiation treatment was 09/03/23.     Pain issues, if any: Denies Using a feeding tube?: He continues to use tube for majority of nutrition/hydration.  Weight changes, if any: Unintended wt loss improving  Swallowing issues, if any: He has ongoing difficulty swallowing, unable to get food down even when cut into small pieces, often spitting it out. He can manage to swallow applesauce and describes a sensation of fluid under his throat that comes and goes.  Smoking or chewing tobacco? Denies Using fluoride toothpaste daily? He uses salt and soda rinses for oral care.  Last ENT visit was on: 05/27/23 Other notable issues, if any: Wife states that she wants something to stimulate his appetite. He states that he wants to get his sense of taste back.  BP 120/76 (BP Location: Right Arm, Patient Position: Sitting, Cuff Size: Large)   Pulse 85   Temp 97.8 F (36.6 C)   Resp 20   Ht 6' 1 (1.854 m)   Wt 246 lb 9.6 oz (111.9 kg)   SpO2 98%   BMI 32.53 kg/m

## 2023-12-27 NOTE — Progress Notes (Signed)
 Radiation Oncology         [phone]) [phone] ________________________________  Name: Isaac Carpenter MRN: [id number]  Date: 12/28/2023  DOB: 05/20/1950  Follow-Up Visit Note Outpatient, in person  CC: [name], [name], MD  [name], [name], MD  Diagnosis and Prior Radiotherapy:     No diagnosis found.      Cancer Staging  Malignant neoplasm of prostate [facility]) Staging form: Prostate, AJCC 8th Edition - Clinical stage from 05/13/2022: Stage IIC (cT1c, cN0, cM0, PSA: 13.2, Grade Group: 3) - Signed by Sherwood Rise, PA-C on 07/17/2022 Histopathologic type: Adenocarcinoma, NOS Stage prefix: Initial diagnosis Prostate specific antigen (PSA) range: 10 to 19 Gleason primary pattern: 4 Gleason secondary pattern: 3 Gleason score: 7 Histologic grading system: 5 grade system Number of biopsy cores examined: 12 Number of biopsy cores positive: 4 Location of positive needle core biopsies: Both sides  Malignant neoplasm of tonsillar fossa (HCC) Staging form: Pharynx - HPV-Mediated Oropharynx, AJCC 8th Edition - Clinical stage from 06/08/2023: Stage II (cT3, cN2, cM0, p16+) - Signed by Izell Domino, MD on 06/08/2023 Stage prefix: Initial diagnosis  ==========DELIVERED PLANS==========  First Treatment Date: 2023-07-12 Last Treatment Date: 2023-09-03   Plan Name: [id number]_Tonsil Site: Tonsil, Right Technique: IMRT Mode: Photon Dose Per Fraction: 2 Gy Prescribed Dose (Delivered / Prescribed): 70 Gy / 70 Gy Prescribed Fxs (Delivered / Prescribed): 35 / 35  Stage II (cT3, N2, M0) squamous cell carcinoma of the tonsillar fossa, p16+; s/p concurrent chemoradiation completed on 09/03/2023  CHIEF COMPLAINT:  Here for follow-up and surveillance of oropharyngeal cancer  Narrative:  The patient returns today for routine follow-up and to review his imaging. He is with his supportive significant other  PET image from 11/16/2023 demonstrates an interval response to therapy as evidenced by marked decrease  in hypermetabolism and soft tissue thickening associated with the right oropharyngeal mass.  No evidence of metastatic disease.  ***   ALLERGIES:  is allergic to other and testosterone.  Meds: Current Outpatient Medications  Medication Sig Dispense Refill   ALPRAZolam  (XANAX ) 1 MG tablet TAKE ONE-HALF TABLET BY MOUTH TWO TIMES DAILY AS NEEDED FOR ANXIETY 60 tablet 0   amitriptyline  (ELAVIL ) 25 MG tablet Take 1 tablet (25 mg total) by mouth at bedtime. (Patient not taking: Reported on 11/23/2023) 30 tablet 3   aspirin 81 MG chewable tablet Chew 81 mg by mouth daily.     clotrimazole  (MYCELEX ) 10 MG troche Suck through 1 troche 5 times daily for 14 consecutive days (Patient not taking: Reported on 11/23/2023) 70 Troche 0   dexamethasone  (DEXAMETHASONE  INTENSOL) 1 MG/ML solution TAKE BY MOUTH OR G-TUBE ONCE DAILY FOR THREE DAYS AFTER CHEMO (Patient not taking: Reported on 10/19/2023) 30 mL 0   doxazosin  (CARDURA ) 1 MG tablet TAKE ONE TABLET BY MOUTH AT BEDTIME 30 tablet 1   esomeprazole  (NEXIUM ) 40 MG capsule Take 1 capsule (40 mg total) as directed daily at 12 noon. See instructions to administer through Tube. 30 capsule 0   fentaNYL  (DURAGESIC ) 50 MCG/HR Place 1 patch onto the skin every 3 (three) days. (Patient not taking: Reported on 11/23/2023) 10 patch 0   fluconazole  (DIFLUCAN ) 40 MG/ML suspension Take 5mL today, then 2.5mL daily for 20 more days. HOLD ATORVASTATIN  WHILE ON THIS. (Patient not taking: Reported on 11/23/2023) 55 mL 0   guaiFENesin (MUCINEX FAST-MAX CHEST CONG MS) 100 MG/5ML liquid Place 20 mLs into feeding tube every 6 (six) hours as needed for cough or to loosen phlegm.  hyoscyamine  (LEVSIN /SL) 0.125 MG SL tablet Place 2 tablets (0.25 mg total) under the tongue every 6 (six) hours as needed. 60 tablet 1   levothyroxine  (SYNTHROID ) 88 MCG tablet TAKE ONE TABLET BY MOUTH DAILY. 90 tablet 2   lidocaine  (XYLOCAINE ) 2 % solution Patient: Mix 1part 2% viscous lidocaine , 1part water .  Swish and spit 10mL of diluted mixture every 2 hours as needed. 300 mL 3   lidocaine -prilocaine  (EMLA ) cream Apply to affected area once 30 g 3   magic mouthwash (nystatin , diphenhydrAMINE, alum & mag hydroxide) suspension mixture Take 5 mLs by mouth 3 (three) times daily as directed. 150 mL 3   metoCLOPramide  (REGLAN ) 10 MG/10ML SOLN Place 10 mLs (10 mg total) into feeding tube 3 (three) times daily before meals. (Patient not taking: Reported on 11/23/2023) 900 mL 0   metoCLOPramide  (REGLAN ) 5 MG/5ML solution PLACE INTO FEEDING TUBE THREE TIMES DAILY BEFORE MEALS (Patient not taking: Reported on 11/23/2023) 900 mL 0   Nutritional Supplements (NUTREN 1.5) LIQD 2 cartons Nutren 1.5 (500 ml) QID via tube. Flush with 60 ml water  before/after each bolus. Provide additional 250 ml water  flush 4x/day in between feedings to meet hydration needs. Provides 3000 kcal, 136 g protein, 1528 ml free water  (3008 ml total water ) 2000 ml/day meets 100% DRI     ondansetron  (ZOFRAN ) 8 MG tablet TAKE ONE TABLET BY MOUTH EVERY 8 HOURS AS NEEDED FOR NAUSEA AND VOMITING. START ON THE THIRD DAY AFTER CISPLATIN  30 tablet 1   oxyCODONE  (OXY IR/ROXICODONE ) 5 MG immediate release tablet Take 1 tablet (5 mg total) by mouth every 6 (six) hours as needed for severe pain (pain score 7-10). 90 tablet 0   prochlorperazine  (COMPAZINE ) 10 MG tablet Take 1 tablet (10 mg total) by mouth every 6 (six) hours as needed (Nausea or vomiting). 30 tablet 1   solifenacin (VESICARE) 5 MG tablet Take 5 mg by mouth daily.     traZODone  (DESYREL ) 50 MG tablet Take 1 tablet (50 mg total) by mouth at bedtime. (Patient not taking: Reported on 11/23/2023) 30 tablet 1   No current facility-administered medications for this encounter.   Physical Findings:   Wt Readings from Last 3 Encounters:  12/08/23 248 lb 4.8 oz (112.6 kg)  11/23/23 (P) 253 lb 4 oz (114.9 kg)  11/01/23 252 lb 8 oz (114.5 kg)    vitals were not taken for this visit. SABRA    HEENT:  White coating on dry tongue, mucosal ulcer on uvula. Xerostomia throughout oral mucosa. Dry No tumor in upper throat. NECK: No masses in neck. Lymphedema in anterior neck. Dry skin over neck, no peeling.    Lab Findings: Lab Results  Component Value Date   WBC 3.6 (L) 12/08/2023   HGB 11.2 (L) 12/08/2023   HCT 33.3 (L) 12/08/2023   MCV 94.6 12/08/2023   PLT 167 12/08/2023    Lab Results  Component Value Date   TSH 3.400 12/08/2023    Radiographic Findings: No results found.   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  ***   Painful swallowing due to radiation-induced mucositis Persistent severe pain from mucosal ulcer on uvula and dry mucous membranes. No active cancer on PET imaging. Significant side effects from pain medication. - Patient states oxycodone  10  mg PRN makes pain tolerable.  - Encourage swallowing exercises. He continues to follow with SLP.  - Advise soft foods and thickened liquids.***   Oral candidiasis White patches in throat possibly contributing to irritation and pain. Previous treatment may not have fully resolved infection. - He was previously prescribed clotrimazole  lozenges, but did not complete these. - Encouraged him to finish prescription of clotrimazole  as thrush could be contributing to some of his symptoms. ***  Lymphedema of the neck - Lymphedema likely secondary to radiation therapy. Referral placed to PT today. ***  Tonsil cancer *** No evidence of active cancer on examination and PET imaging. Good response to radiation therapy. Continued inflammation and healing expected.  Nutritional Status: Patient is starting to tolerate minimal oral intake. Wt stable. He states he has no appetite after 6  cartons/day. Encouraged patient to start decreasing PEG tube, in order to increase his appetite. He will continue to follow with nutrition.  *** Wt Readings from Last 3 Encounters:  12/08/23 248 lb 4.8 oz (112.6 kg)  11/23/23 (P) 253 lb 4 oz (114.9 kg)  11/01/23 252 lb 8 oz (114.5 kg)   PEG tube: In-place, using  ***  Dental: Encouraged to continue regular followup with dentistry, and dental hygiene including fluoride rinses. ***  Thyroid  function: Checking annually ***  Lab Results  Component Value Date   TSH 3.400 12/08/2023   Other: Mucositis/xerostomia - Patient has been encouraged to use saltwater/baking soda rinse as well as dry mouth remedies (regular sips of fluid, lozenges, biotene gel, etc.).  ***  Follow-up: Patient will follow-up with Dr. Autumn on 12/08/2023 and Dr. Soldatova in 4 months. I will see the patient back in 1 month, sooner if needed. Patient expressed understanding and is in agreement with the stated plan. ***      On date of service, in total, I spent 30 minutes on this encounter. Patient was seen in person. Note signed after encounter date; minutes pertain to date of service, only.  -----------------------------------   Leeroy Due, PA-C

## 2023-12-28 ENCOUNTER — Ambulatory Visit
Admission: RE | Admit: 2023-12-28 | Discharge: 2023-12-28 | Disposition: A | Discharge status: Home / Self Care | Source: Ambulatory Visit | Attending: Radiology | Admitting: Radiology

## 2023-12-28 ENCOUNTER — Ambulatory Visit

## 2023-12-28 ENCOUNTER — Encounter: Payer: Self-pay | Admitting: Radiology

## 2023-12-28 ENCOUNTER — Ambulatory Visit: Admitting: Dietician

## 2023-12-28 VITALS — BP 120/76 | HR 85 | Temp 97.8°F | Resp 20 | Ht 73.0 in | Wt 246.6 lb

## 2023-12-28 DIAGNOSIS — C090 Malignant neoplasm of tonsillar fossa: Secondary | ICD-10-CM | POA: Diagnosis not present

## 2023-12-28 DIAGNOSIS — R059 Cough, unspecified: Secondary | ICD-10-CM | POA: Diagnosis not present

## 2023-12-28 DIAGNOSIS — R293 Abnormal posture: Secondary | ICD-10-CM

## 2023-12-28 DIAGNOSIS — I890 Lymphedema, not elsewhere classified: Secondary | ICD-10-CM

## 2023-12-28 HISTORY — DX: Personal history of irradiation: Z92.3

## 2023-12-28 NOTE — Therapy (Signed)
 OUTPATIENT PHYSICAL THERAPY HEAD AND NECK POST RADIATION TREATMENT   Patient Name: Isaac Carpenter MRN: [id number] DOB:[birth year], 73 y.o., male Today's Date: 12/28/2023  END OF SESSION:  PT End of Session - 12/28/23 1103     Visit Number 6    Number of Visits 10    Date for PT Re-Evaluation 01/11/24    Authorization Type [employer]    Authorization Time Period 8/26-9/23    Authorization - Visit Number 5    Authorization - Number of Visits 9    PT Start Time 1104    PT Stop Time 1153    PT Time Calculation (min) 49 min    Activity Tolerance Patient tolerated treatment well    Behavior During Therapy WFL for tasks assessed/performed          Past Medical History:  Diagnosis Date   Arthritis    Cancer (HCC)    History of partial thyroidectomy STATES OVERACTIVE THYROID -- NO ISSUES SINCE AGE 69 AND NO MEDS   History of radiation therapy    07/12/23-09/03/23 Right Tonsil  Dr. Lauraine Golden   Hyperlipidemia    Hypertension    Hypothyroidism    Sleep apnea    Per patient he tried CPAP but could not tolerate.   Thyroid  disease    Past Surgical History:  Procedure Laterality Date   CIRCUMCISION  01/04/2012   Procedure: CIRCUMCISION ADULT;  Surgeon: Arlena LILLETTE Gal, MD;  Location[facility];  Service: Urology;  Laterality: N/A;   COLONOSCOPY  04/08/2020   Vito Cirigliano   CYSTOSCOPY  09/04/2022   Procedure: CYSTOSCOPY;  Surgeon: Matilda Senior, MD;  Location: [facility];  Service: Urology;;   IR GASTROSTOMY TUBE MOD SED  06/18/2023   IR IMAGING GUIDED PORT INSERTION  06/18/2023   RADIOACTIVE SEED IMPLANT N/A 09/04/2022   Procedure: RADIOACTIVE SEED IMPLANT/BRACHYTHERAPY IMPLANT;  Surgeon: Matilda Senior, MD;  [facility];  Service: Urology;  Laterality: N/A;  90 MINS   SPACE OAR INSTILLATION N/A 09/04/2022   Procedure: SPACE OAR INSTILLATION;  Surgeon: Matilda Senior, MD;  Location: [facility];  Service: Urology;  Laterality: N/A;   THYROIDECTOMY, PARTIAL  AGE 69   OVERACTIVE THYROID    Patient Active Problem List   Diagnosis Date Noted   Cancer (HCC) 08/14/2023   Febrile neutropenia (HCC) 08/14/2023   S/P percutaneous endoscopic gastrostomy (PEG) tube placement (HCC) 08/14/2023   Hyponatremia 08/14/2023   Normocytic anemia 08/14/2023   Chemotherapy induced neutropenia (HCC) 07/30/2023   Fatigue 07/16/2023   Situational anxiety 07/09/2023   Encounter for antineoplastic chemotherapy 07/09/2023   Dysphagia 07/09/2023   Port-A-Cath in place 07/05/2023   Sensorineural hearing loss (SNHL) of right ear 06/23/2023   Cancer associated pain 06/11/2023   Coordination of complex care 06/11/2023   Malignant neoplasm of tonsillar fossa (HCC) 06/08/2023   Malignant neoplasm of prostate (HCC) 07/17/2022   Hyperlipidemia 05/11/2021   Morbid obesity (HCC)-BMI 37.8 with DM II,HLD,OSA,HTN 05/09/2021   Hypertension, essential, benign 07/23/2020   OSA (obstructive sleep apnea) 07/23/2020   Type 2 diabetes mellitus with other specified complication (HCC) 01/31/2020   BPH associated with nocturia 01/31/2020   Hypothyroidism, postsurgical 01/31/2020    PCP: Betty [name], MD   REFERRING PROVIDER: Lauraine Golden, MD   REFERRING DIAG: C09.0 (ICD-10-CM) - Malignant neoplasm of tonsillar fossa (HCC)   THERAPY DIAG:  Lymphedema, not elsewhere classified  Abnormal posture  Malignant neoplasm of tonsillar fossa (HCC)  Rationale for  Evaluation and Treatment: Rehabilitation  ONSET DATE: 05/27/23  SUBJECTIVE:                                                                                                                                                                                           SUBJECTIVE STATEMENT: I can wear the garment without the head straps and I can tolerate it as long as the straps aren't over my head. He still wears the pack that [facility] some too, but it does  swell underneath it. We have not tried the MLD at home because it was a busy weekend.  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 I have not been able to eat. I can not even get to the point of swallowing it. In the last week I have not had a problem with the phlegm at night but when I get up it sounds like my throat is full of phlegm and now I can not get it up.   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 He presented to his PCP on 05/17/23 with complaints of right sided jaw/maxillary pain when coughing. 05/17/23 He underwent a CT neck which revealed an ulcerated enhancing mass involving the superior oropharynx and soft palate/uvula measuring 3.4 x 3.0 x 3.6 cm in the greatest extent that is concerning for malignancy. No evidence of cervical lymphadenopathy was indicated on scan. 05/27/23 He saw Dr. Soldatova. She completed a laryngoscopy with biopsies which revealed Invasive moderately differentiated SCC, p 16 +. 06/04/23 visit with Dr. Soldatova for worsening symptoms of odynophagia and dysphagia. He was unable to maintain adequate nutrition. 06/07/23 PET revealing a right oropharyngeal tumor >4cm, likely b/l level II adenopathy, no distant metastases. He will receive 35 fractions of radiation to his right Tonsil and bilateral neck with weekly chemotherapy which started on 07/12/23 and will complete 08/27/23. PEG/PAC 2/28/25MBSS 06/28/23. When he presented for consult he had severe dysphagia and PEG was placed quickly. He did recover from the dysphagia (patient thinks it was related to biopsy pain).   PATIENT GOALS:  Reassess how my recovery is going related to neck ROM, cervical pain, fatigue, and swelling.  PAIN:  Are you having pain? No  PRECAUTIONS: Recent radiation, Head and neck lymphedema risk,    OBJECTIVE:   POSTURE:  Forward head and rounded shoulders posture  30 SEC SIT TO STAND: 12/14/23: 10 reps in 30 sec without use of UEs which  is below average for patient's age At eval 07/22/23: 10 reps in 30 sec without use of UEs which is  Below average for  patient's age.   SHOULDER AROM:   Limited bilaterally but functional      CERVICAL AROM:     Percent limited 07/22/23 eval 12/14/23  Flexion WFL 25% limited  Extension 50% limited 50% limited  Right lateral flexion 75% limited 75% limited  Left lateral flexion 75% limited 75% limited  Right rotation 50% limited 50% limited  Left rotation 50% limited 50% limited                          (Blank rows=not tested)  LYMPHEDEMA ASSESSMENT:    Circumference in cm  4 cm superior to sternal notch around neck 47  6 cm superior to sternal notch around neck 48  8 cm superior to sternal notch around neck 48.5  R lateral nostril from base of nose to medial tragus   L lateral nostril from base of nose to medial tragus   R corner of mouth to where ear lobe meets face   L corner of mouth to where ear lobe meets face         (Blank rows=not tested)   OTHER SYMPTOMS: Pain No Fibrosis Yes Pitting edema Yes Infections No Decreased scar mobility No    TREATMENT TODAY 12/28/2023 Discussed progress with pt. May try [employer] foam pack under garment to see if it helps Educated in postural theraband exs; Scapular retraction, shoulder extension, B. ER all with Green band. Updated HEP with pics and band Pt sitting in chair instructed in [place] anterior approach as follows: Caution with portacath, short neck, 5 diaphragmatic breaths, bilateral axillary nodes, bilateral pectoral nodes, anterior chest, short neck, posterior neck moving fluid towards pathway aimed at lateral neck, then lateral and anterior neck moving fluid towards pathway aimed at lateral neck then retracing all steps.  Had pt return demo each step of the cervical treatment while therapist provided v/c and t/c for correct direction of stretch, sequence and skin stretch technique. Therapist then placed pt on table and therapist  performed all steps in reverse.  12/23/2023 Donned pts Tribute properly and within a minute or 2 he felt claustrophobic. Removed the head strap and he was able to tolerated the neck part snugged up pretty well. He will try it at home while awake using just the neck part, and we may be able to transition to the head part later.  [place] anterior approach as follows: Therapist performed [facility] anterior approach handout as follows: short neck, 5 diaphragmatic breaths, bilateral axillary nodes, bilateral pectoral nodes, anterior chest, short neck, posterior neck moving fluid towards pathway aimed at lateral neck, then lateral and anterior neck moving fluid towards pathway aimed at lateral neck then retracing all steps. Then had pt's spouse return demo each step of the cervical treatment while therapist provided v/c and t/c for correct direction of stretch, sequence and skin stretch technique. She watched therapist perform sequence in reverse.  12/22/23: Assessed fit of tribute head and neck garment. It was a little tight at the back of the neck so added an extender strap with Velcro and pt reports increased comfort with this.  Instructed pt's spouse in MLD technique using [place] anterior approach as follows: Instructed pt using [place] anterior approach handout as follows: short neck, 5 diaphragmatic breaths, bilateral axillary nodes, bilateral pectoral nodes, anterior chest, short neck, posterior neck moving fluid towards pathway aimed at lateral neck, then lateral and anterior neck moving fluid towards pathway aimed at lateral neck then retracing all steps. Had pt's  spouse return demo each step while therapist provided v/c and t/c for correct direction of stretch, sequence and skin stretch technique. After educating pt in entire sequence therapist focused on lymphedema at anterior neck then retraced all steps.    12/16/2023  Assisted pts wife with ordering garment on [employer] since she couldn't get the site to  come up on her phone; Gave print out of order Educated pt and his wife on lymphatics, and the gentle nature of technique and role of MLD in decreasing edema. Therapist performed [facility] anterior approach handout as follows with caution on right due to portacath: short neck, 5 diaphragmatic breaths, bilateral axillary nodes, bilateral pectoral nodes, anterior chest, short neck, posterior neck moving fluid towards pathway aimed at lateral neck, then lateral and anterior neck moving fluid towards pathway aimed at lateral neck then retracing all steps. Pts wife observed and then practiced MLD to left supraclavicular region only due to portacath, axillary and pectoral LN's, chest and briefly to neck region. She was given a written handout.   PATIENT EDUCATION:  Education details: anatomy and physiology of the lymphatic system, how lymphedema is managed through self MLD and compression, head and neck tribute Radiation protection practitioner educated: Patient and Spouse Education method: Medical illustrator Education comprehension: verbalized understanding  HOME EXERCISE PROGRAM: Reviewed previously given post op HEP. Wear chip pack at least 4 to 5 hours per day  ASSESSMENT:  CLINICAL IMPRESSION: Pt used correct hand positions with MLD very well but was sliding a lot and not stretching properly. He was reminded to avoid over portacath. He used good posture with theraband exercises instructed today. Pt and his wife encouraged to try MLD at home. EVAL Pt returns to PT after completing chemo and radiation for treatment of tonsillar cancer. He reports he has had a difficult time recovering. He is still unable to eat and is relying on his feeding tube. He has difficulty swallowing. He has lymphedema at his anterior throat with fibrosis present. His neck ROM is the same as baseline which is limited in all directions. His neck flexion was [facility] at baseline but now is limited possibly due to his lymphedema. He would  benefit from skilled PT services to decrease anterior neck lymphedema to allow improved swallowing and decrease risk of infection and to improve cervical flexion ROM and posture.   Pt will benefit from skilled therapeutic intervention to improve on the following deficits: decreased knowledge of condition, decreased knowledge of use of DME, decreased ROM, increased edema, increased fascial restrictions, and postural dysfunction  PT treatment/interventions: ADL/Self care home management, [phone]- PT Re-evaluation, 97110-Therapeutic exercises, 97530- Therapeutic activity, [id number]- Neuromuscular re-education, 97535- Self Care, [id number]- Manual therapy, [id number]- Orthotic Initial, and [id number]- Orthotic/Prosthetic subsequent     GOALS: Goals reviewed with patient? Yes  LONG TERM GOALS:  (STG=LTG)  GOALS Name Target Date  Goal status  1 Pt will demonstrate a return to baseline cervical ROM measurements and not demonstrate any signs or symptoms of lymphedema. Baseline: 01/11/24 INITIAL  2 Pt and/or spouse will be independent in self MLD for long term management of lymphedema. 01/11/24  NEW  3 Pt will obtain a compression garment for long term management of lymphedema. 01/11/24  NEW  4 Pt will demonstrate a 1 cm decrease in edema at 8 cm superior to sternal notch to decrease risk of infection. 01/11/24  NEW       PLAN:  PT FREQUENCY/DURATION: 2x/wk for 4 wks  PLAN FOR NEXT SESSION: Did he try garment  without the head strap?Review MLD with pt/wife, MLD and give handout,    [facility]  [address], [address]  [place] [address]  [phone]   Scar massage You can begin gentle scar massage to you incision sites. Gently place one hand on the incision and move the skin (without sliding on the skin) in various directions. Do this for a few minutes and then you can gently massage either coconut oil or vitamin E cream into the scars.  Home exercise Program Continue doing the  exercises you were given until you feel like you can do them without feeling any tightness at the end. It is best to do them for several months after completion of radiation since the effects of radiation continue past completion.   Walking Program Studies show that 30 minutes of walking per day (fast enough to elevate your heart rate) can significantly reduce the risk of a cancer recurrence. If you can't walk due to other medical reasons, we encourage you to find another activity you could do (like a stationary bike or water  exercise).  Posture After treatment for head and neck cancer, people frequently sit with rounded shoulders and forward head posture because the front of the neck has become tight and it feels better. If you sit like this, you can become very tight and have pain in sitting or standing with good posture. Try to be aware of your posture and sit and stand up tall to heal properly.  Follow up PT: Please let you doctor know as soon as possible if you develop any swelling in your face or neck in the future. Lymphedema (swelling) can occur months after completion of radiation. The sooner you can let the doctor know, the sooner they can refer you back to PT. It is much easier to treat the swelling early on.   Grayce JINNY Sheldon, PT 12/28/2023, 11:57 AM

## 2023-12-28 NOTE — Patient Instructions (Signed)
CR

## 2023-12-28 NOTE — Progress Notes (Signed)
 Nutrition Follow-up:  Pt has completed concurrent chemoradiation (final RT 5/16).   S/p Gtube 2/28 - replaced at [facility] with non Enfit FT 5/28 DME: [employer]  Met with patient and wife in office. Patient continues to rely on tube. Recalls 6 cartons Nutren 1.5. He wants to eat, but has no appetite. Says he does not get hungry or thirsty. Wife reports appetite stimulant recommended at radiation follow-up this morning. Patient discouraged by taste of foods. Meats of any kind taste horrible. Does not like the taste of eggs, pudding, applesauce, spaghetti. He has dysphagia with rice. Recalls pintos, lima beans, chocolate as foods that taste normal. Does not eat more than a couple of bites. He is not drinking much water . Patient tolerates fruit punch gatorade well. He has not tried drinking Ensure.  Patient no longer having pain with swallowing. Taking all medications po. Denies nausea or vomiting. Bowels moving regularly. Taking miralax  as needed.   Medications: reviewed   Labs: no new labs for review   Anthropometrics: Wt 246 lb 9.6 oz decreased from 248 lb 4.8 oz on 8/20   NUTRITION DIAGNOSIS: Unintended wt loss continues    INTERVENTION:  Recommend good oral care practices - brushing 2x/day, baking soda salt water  rinses  Encouraged to lean into flavors that work - bitter foods are working well per recall Pt agreeable to try chocolate Ensure - provided samples of Ensure Complete, Boost VHC and CIB in chocolate  Suggested water  with po trials vs gatorade Wife is planning to try zinc supplement for taste - educated to stop supplement after 10 days due to concerns of copper deficiency Continue 6 cartons Nutren 1.5  Will continue working with pt to wean from - suggested substituting one po Ensure Complete (if tolerated) for one bolus feed  Trial of appetite stimulant recommended per wife report Support and encouragement   MONITORING, EVALUATION, GOAL: wt trends, intake    NEXT  VISIT: Tuesday September 23 via telephone

## 2023-12-30 ENCOUNTER — Ambulatory Visit: Admitting: Physical Therapy

## 2023-12-30 ENCOUNTER — Encounter: Payer: Self-pay | Admitting: Physical Therapy

## 2023-12-30 DIAGNOSIS — R293 Abnormal posture: Secondary | ICD-10-CM

## 2023-12-30 DIAGNOSIS — C090 Malignant neoplasm of tonsillar fossa: Secondary | ICD-10-CM | POA: Diagnosis not present

## 2023-12-30 DIAGNOSIS — I890 Lymphedema, not elsewhere classified: Secondary | ICD-10-CM | POA: Diagnosis not present

## 2023-12-30 DIAGNOSIS — R059 Cough, unspecified: Secondary | ICD-10-CM | POA: Diagnosis not present

## 2023-12-30 NOTE — Therapy (Signed)
 OUTPATIENT PHYSICAL THERAPY HEAD AND NECK POST RADIATION TREATMENT   Patient Name: Isaac Carpenter MRN: [id number] DOB:1950-08-28, 73 y.o., male Today's Date: 12/30/2023  END OF SESSION:  PT End of Session - 12/30/23 1006     Visit Number 7    Number of Visits 10    Date for PT Re-Evaluation 01/11/24    Authorization Type [employer]    Authorization - Visit Number 6    Authorization - Number of Visits 9    PT Start Time 1005    PT Stop Time 1053    PT Time Calculation (min) 48 min    Activity Tolerance Patient tolerated treatment well    Behavior During Therapy WFL for tasks assessed/performed          Past Medical History:  Diagnosis Date   Arthritis    Cancer (HCC)    History of partial thyroidectomy STATES OVERACTIVE THYROID -- NO ISSUES SINCE AGE 16 AND NO MEDS   History of radiation therapy    07/12/23-09/03/23 Right Tonsil  Dr. Lauraine Golden   Hyperlipidemia    Hypertension    Hypothyroidism    Sleep apnea    Per patient he tried CPAP but could not tolerate.   Thyroid  disease    Past Surgical History:  Procedure Laterality Date   CIRCUMCISION  01/04/2012   Procedure: CIRCUMCISION ADULT;  Surgeon: Arlena LILLETTE Gal, MD;  Location: [facility];  Service: Urology;  Laterality: N/A;   COLONOSCOPY  04/08/2020   Vito Cirigliano   CYSTOSCOPY  09/04/2022   Procedure: CYSTOSCOPY;  Surgeon: Matilda Senior, MD;  Location: [facility];  Service: Urology;;   IR GASTROSTOMY TUBE MOD SED  06/18/2023   IR IMAGING GUIDED PORT INSERTION  06/18/2023   RADIOACTIVE SEED IMPLANT N/A 09/04/2022   Procedure: RADIOACTIVE SEED IMPLANT/BRACHYTHERAPY IMPLANT;  Surgeon: Matilda Senior, MD;  Location: [facility];  Service: Urology;  Laterality: N/A;  90 MINS   SPACE OAR INSTILLATION N/A 09/04/2022   Procedure: SPACE OAR INSTILLATION;  Surgeon: Matilda Senior, MD;  Location: [facility];  Service: Urology;  Laterality:  N/A;   THYROIDECTOMY, PARTIAL  AGE 16   OVERACTIVE THYROID    Patient Active Problem List   Diagnosis Date Noted   Cancer (HCC) 08/14/2023   Febrile neutropenia (HCC) 08/14/2023   S/P percutaneous endoscopic gastrostomy (PEG) tube placement (HCC) 08/14/2023   Hyponatremia 08/14/2023   Normocytic anemia 08/14/2023   Chemotherapy induced neutropenia (HCC) 07/30/2023   Fatigue 07/16/2023   Situational anxiety 07/09/2023   Encounter for antineoplastic chemotherapy 07/09/2023   Dysphagia 07/09/2023   Port-A-Cath in place 07/05/2023   Sensorineural hearing loss (SNHL) of right ear 06/23/2023   Cancer associated pain 06/11/2023   Coordination of complex care 06/11/2023   Malignant neoplasm of tonsillar fossa (HCC) 06/08/2023   Malignant neoplasm of prostate (HCC) 07/17/2022   Hyperlipidemia 05/11/2021   Morbid obesity (HCC)-BMI 37.8 with DM II,HLD,OSA,HTN 05/09/2021   Hypertension, essential, benign 07/23/2020   OSA (obstructive sleep apnea) 07/23/2020   Type 2 diabetes mellitus with other specified complication (HCC) 01/31/2020   BPH associated with nocturia 01/31/2020   Hypothyroidism, postsurgical 01/31/2020    PCP: Betty [name], MD   REFERRING PROVIDER: Lauraine Golden, MD   REFERRING DIAG: C09.0 (ICD-10-CM) - Malignant neoplasm of tonsillar fossa (HCC)   THERAPY DIAG:  Lymphedema, not elsewhere classified  Abnormal posture  Malignant neoplasm of tonsillar fossa (HCC)  Rationale for Evaluation and Treatment: Rehabilitation  ONSET DATE:  05/27/23  SUBJECTIVE:                                                                                                                                                                                           SUBJECTIVE STATEMENT: My wife does the self massage. I have been wearing the garment with the top straps undone and that has helped. I can not tell if my swelling is going.   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 I have not been able to eat.  I can not even get to the point of swallowing it. In the last week I have not had a problem with the phlegm at night but when I get up it sounds like my throat is full of phlegm and now I can not get it up.   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 He presented to his PCP on 05/17/23 with complaints of right sided jaw/maxillary pain when coughing. 05/17/23 He underwent a CT neck which revealed an ulcerated enhancing mass involving the superior oropharynx and soft palate/uvula measuring 3.4 x 3.0 x 3.6 cm in the greatest extent that is concerning for malignancy. No evidence of cervical lymphadenopathy was indicated on scan. 05/27/23 He saw Dr. Soldatova. She completed a laryngoscopy with biopsies which revealed Invasive moderately differentiated SCC, p 16 +. 06/04/23 visit with Dr. Soldatova for worsening symptoms of odynophagia and dysphagia. He was unable to maintain adequate nutrition. 06/07/23 PET revealing a right oropharyngeal tumor >4cm, likely b/l level II adenopathy, no distant metastases. He will receive 35 fractions of radiation to his right Tonsil and bilateral neck with weekly chemotherapy which started on 07/12/23 and will complete 08/27/23. PEG/PAC 2/28/25MBSS 06/28/23. When he presented for consult he had severe dysphagia and PEG was placed quickly. He did recover from the dysphagia (patient thinks it was related to biopsy pain).   PATIENT GOALS:  Reassess how my recovery is going related to neck ROM, cervical pain, fatigue, and swelling.  PAIN:  Are you having pain? No  PRECAUTIONS: Recent radiation, Head and neck lymphedema risk,    OBJECTIVE:   POSTURE:  Forward head and rounded shoulders posture  30 SEC SIT TO STAND: 12/14/23: 10 reps in 30 sec without use of UEs which is below average for patient's age At eval 07/22/23: 10 reps in 30 sec without use of UEs which is  Below average for patient's age.   SHOULDER AROM:    Limited bilaterally but functional      CERVICAL AROM:     Percent limited 07/22/23 eval 12/14/23  Flexion WFL 25% limited  Extension 50% limited 50% limited  Right lateral flexion 75% limited 75% limited  Left lateral flexion 75% limited 75% limited  Right rotation 50% limited 50% limited  Left rotation 50% limited 50% limited                          (Blank rows=not tested)  LYMPHEDEMA ASSESSMENT:    Circumference in cm 12/30/23  4 cm superior to sternal notch around neck 47 47  6 cm superior to sternal notch around neck 48 48.4  8 cm superior to sternal notch around neck 48.5 49.5  R lateral nostril from base of nose to medial tragus    L lateral nostril from base of nose to medial tragus    R corner of mouth to where ear lobe meets face    L corner of mouth to where ear lobe meets face            (Blank rows=not tested)   OTHER SYMPTOMS: Pain No Fibrosis Yes Pitting edema Yes Infections No Decreased scar mobility No    TREATMENT TODAY 12/30/23: Since pt is not able to tolerate his head and neck garment with strap on top of his head due to claustrophobia, therapist cut back edges of garment around face. Pt reported he thought is claustrophobia was caused by decreased vision from the garment. Therapist trimmed and re donned but pt felt panicked. Educated pt to try again at home but if he is unable to at least just use the strap behind the neck. Circumferences remeasured - they have increased some Continued MLD to anterior neck in supine as follows: short neck, 5 diaphragmatic breaths, bilateral axillary nodes, bilateral pectoral nodes, anterior chest, short neck, posterior neck moving fluid towards pathway aimed at lateral neck, then lateral and anterior neck moving fluid towards pathway aimed at lateral neck then retracing all steps    12/28/2023 Discussed progress with pt. May try [employer]aires foam pack under garment to see if it helps Educated in postural theraband exs;  Scapular retraction, shoulder extension, B. ER all with Green band. Updated HEP with pics and band Pt sitting in chair instructed in [facility] anterior approach as follows: Caution with portacath, short neck, 5 diaphragmatic breaths, bilateral axillary nodes, bilateral pectoral nodes, anterior chest, short neck, posterior neck moving fluid towards pathway aimed at lateral neck, then lateral and anterior neck moving fluid towards pathway aimed at lateral neck then retracing all steps.  Had pt return demo each step of the cervical treatment while therapist provided v/c and t/c for correct direction of stretch, sequence and skin stretch technique. Therapist then placed pt on table and therapist performed all steps in reverse.  12/23/2023 Donned pts Tribute properly and within a minute or 2 he felt claustrophobic. Removed the head strap and he was able to tolerated the neck part snugged up pretty well. He will try it at home while awake using just the neck part, and we may be able to transition to the head part later.  [place] anterior approach as follows: Therapist performed [facility] anterior approach handout as follows: short neck, 5 diaphragmatic breaths, bilateral axillary nodes, bilateral pectoral nodes, anterior chest, short neck, posterior neck moving fluid towards pathway aimed at lateral neck, then lateral and anterior neck moving fluid towards pathway aimed at lateral neck then retracing all steps. Then had pt's spouse return demo each step of the cervical treatment while therapist provided v/c and t/c  for correct direction of stretch, sequence and skin stretch technique. She watched therapist perform sequence in reverse.  12/22/23: Assessed fit of tribute head and neck garment. It was a little tight at the back of the neck so added an extender strap with Velcro and pt reports increased comfort with this.  Instructed pt's spouse in MLD technique using [facility] anterior approach as follows: Instructed pt using  [place] anterior approach handout as follows: short neck, 5 diaphragmatic breaths, bilateral axillary nodes, bilateral pectoral nodes, anterior chest, short neck, posterior neck moving fluid towards pathway aimed at lateral neck, then lateral and anterior neck moving fluid towards pathway aimed at lateral neck then retracing all steps. Had pt's spouse return demo each step while therapist provided v/c and t/c for correct direction of stretch, sequence and skin stretch technique. After educating pt in entire sequence therapist focused on lymphedema at anterior neck then retraced all steps.    12/16/2023  Assisted pts wife with ordering garment on [employer] since she couldn't get the site to come up on her phone; Gave print out of order Educated pt and his wife on lymphatics, and the gentle nature of technique and role of MLD in decreasing edema. Therapist performed [facility] anterior approach handout as follows with caution on right due to portacath: short neck, 5 diaphragmatic breaths, bilateral axillary nodes, bilateral pectoral nodes, anterior chest, short neck, posterior neck moving fluid towards pathway aimed at lateral neck, then lateral and anterior neck moving fluid towards pathway aimed at lateral neck then retracing all steps. Pts wife observed and then practiced MLD to left supraclavicular region only due to portacath, axillary and pectoral LN's, chest and briefly to neck region. She was given a written handout.   PATIENT EDUCATION:  Education details: anatomy and physiology of the lymphatic system, how lymphedema is managed through self MLD and compression, head and neck tribute Radiation protection practitioner educated: Patient and Spouse Education method: Medical illustrator Education comprehension: verbalized understanding  HOME EXERCISE PROGRAM: Reviewed previously given post op HEP. Wear chip pack at least 4 to 5 hours per day  ASSESSMENT:  CLINICAL IMPRESSION: Attempted to modify garment to  help decrease feeling of claustrophobia. Pt still feeling claustrophobic by end of session. Educated pt to just use strap behind neck if he feels anxious with strap over head. There was marked improved in edema at anterior neck following MLD today. Educated pt to do self MLD daily and for at least 30 min since her circumferences have not decreased.   EVAL Pt returns to PT after completing chemo and radiation for treatment of tonsillar cancer. He reports he has had a difficult time recovering. He is still unable to eat and is relying on his feeding tube. He has difficulty swallowing. He has lymphedema at his anterior throat with fibrosis present. His neck ROM is the same as baseline which is limited in all directions. His neck flexion was [facility] at baseline but now is limited possibly due to his lymphedema. He would benefit from skilled PT services to decrease anterior neck lymphedema to allow improved swallowing and decrease risk of infection and to improve cervical flexion ROM and posture.   Pt will benefit from skilled therapeutic intervention to improve on the following deficits: decreased knowledge of condition, decreased knowledge of use of DME, decreased ROM, increased edema, increased fascial restrictions, and postural dysfunction  PT treatment/interventions: ADL/Self care home management, 97164- PT Re-evaluation, 97110-Therapeutic exercises, 97530- Therapeutic activity, 97112- Neuromuscular re-education, 97535- Self Care, 02859- Manual therapy,  02239- Orthotic Initial, and 02236- Orthotic/Prosthetic subsequent     GOALS: Goals reviewed with patient? Yes  LONG TERM GOALS:  (STG=LTG)  GOALS Name Target Date  Goal status  1 Pt will demonstrate a return to baseline cervical ROM measurements and not demonstrate any signs or symptoms of lymphedema. Baseline: 01/11/24 INITIAL  2 Pt and/or spouse will be independent in self MLD for long term management of lymphedema. 01/11/24  NEW  3 Pt will obtain a  compression garment for long term management of lymphedema. 01/11/24  NEW  4 Pt will demonstrate a 1 cm decrease in edema at 8 cm superior to sternal notch to decrease risk of infection. 01/11/24  NEW       PLAN:  PT FREQUENCY/DURATION: 2x/wk for 4 wks  PLAN FOR NEXT SESSION: Did he try garment without the head strap?Review MLD with pt/wife, MLD and give handout,    [facility]  [address]  [place] [address]  [phone]   Scar massage You can begin gentle scar massage to you incision sites. Gently place one hand on the incision and move the skin (without sliding on the skin) in various directions. Do this for a few minutes and then you can gently massage either coconut oil or vitamin E cream into the scars.  Home exercise Program Continue doing the exercises you were given until you feel like you can do them without feeling any tightness at the end. It is best to do them for several months after completion of radiation since the effects of radiation continue past completion.   Walking Program Studies show that 30 minutes of walking per day (fast enough to elevate your heart rate) can significantly reduce the risk of a cancer recurrence. If you can't walk due to other medical reasons, we encourage you to find another activity you could do (like a stationary bike or water  exercise).  Posture After treatment for head and neck cancer, people frequently sit with rounded shoulders and forward head posture because the front of the neck has become tight and it feels better. If you sit like this, you can become very tight and have pain in sitting or standing with good posture. Try to be aware of your posture and sit and stand up tall to heal properly.  Follow up PT: Please let you doctor know as soon as possible if you develop any swelling in your face or neck in the future. Lymphedema (swelling) can occur months after completion of radiation. The sooner  you can let the doctor know, the sooner they can refer you back to PT. It is much easier to treat the swelling early on.   [employer], PT 12/30/2023, 10:58 AM

## 2024-01-04 ENCOUNTER — Ambulatory Visit: Attending: Radiation Oncology

## 2024-01-04 ENCOUNTER — Ambulatory Visit

## 2024-01-04 ENCOUNTER — Other Ambulatory Visit: Payer: Self-pay

## 2024-01-04 DIAGNOSIS — C090 Malignant neoplasm of tonsillar fossa: Secondary | ICD-10-CM

## 2024-01-04 DIAGNOSIS — R1310 Dysphagia, unspecified: Secondary | ICD-10-CM | POA: Insufficient documentation

## 2024-01-04 DIAGNOSIS — R059 Cough, unspecified: Secondary | ICD-10-CM

## 2024-01-04 DIAGNOSIS — I890 Lymphedema, not elsewhere classified: Secondary | ICD-10-CM | POA: Diagnosis not present

## 2024-01-04 DIAGNOSIS — R293 Abnormal posture: Secondary | ICD-10-CM | POA: Diagnosis not present

## 2024-01-04 NOTE — Therapy (Signed)
 OUTPATIENT SPEECH LANGUAGE PATHOLOGY ONCOLOGY TREATMENT   Patient Name: Isaac Carpenter MRN: [id number] DOB:09-29-1950, 73 y.o., male Today's Date: 01/04/2024  PCP: [name], Betty, MD REFERRING PROVIDER: Izell Domino, MD  END OF SESSION:  End of Session - 01/04/24 1339     Visit Number 6    Number of Visits 7    Date for SLP Re-Evaluation 01/31/24    Authorization Type [employer] - pt needs to fill out form when necessary    SLP Start Time 0936    SLP Stop Time  1017    SLP Time Calculation (min) 41 min    Activity Tolerance Patient tolerated treatment well              Past Medical History:  Diagnosis Date   Arthritis    Cancer (HCC)    History of partial thyroidectomy STATES OVERACTIVE THYROID -- NO ISSUES SINCE AGE 75 AND NO MEDS   History of radiation therapy    07/12/23-09/03/23 Right Tonsil  Dr. Domino Izell   Hyperlipidemia    Hypertension    Hypothyroidism    Sleep apnea    Per patient he tried CPAP but could not tolerate.   Thyroid  disease    Past Surgical History:  Procedure Laterality Date   CIRCUMCISION  01/04/2012   Procedure: CIRCUMCISION ADULT;  Surgeon: Arlena LILLETTE Gal, MD;  Location: [facility];  Service: Urology;  Laterality: N/A;   COLONOSCOPY  04/08/2020   Vito Cirigliano   CYSTOSCOPY  09/04/2022   Procedure: CYSTOSCOPY;  Surgeon: Matilda Senior, MD;  Location: [facility];  Service: Urology;;   IR GASTROSTOMY TUBE MOD SED  06/18/2023   IR IMAGING GUIDED PORT INSERTION  06/18/2023   RADIOACTIVE SEED IMPLANT N/A 09/04/2022   Procedure: RADIOACTIVE SEED IMPLANT/BRACHYTHERAPY IMPLANT;  Surgeon: Matilda Senior, MD;  Location: [facility];  Service: Urology;  Laterality: N/A;  90 MINS   SPACE OAR INSTILLATION N/A 09/04/2022   Procedure: SPACE OAR INSTILLATION;  Surgeon: Matilda Senior, MD;  Location: [facility];  Service: Urology;  Laterality: N/A;   THYROIDECTOMY, PARTIAL  AGE 75    OVERACTIVE THYROID    Patient Active Problem List   Diagnosis Date Noted   Cancer (HCC) 08/14/2023   Febrile neutropenia (HCC) 08/14/2023   S/P percutaneous endoscopic gastrostomy (PEG) tube placement (HCC) 08/14/2023   Hyponatremia 08/14/2023   Normocytic anemia 08/14/2023   Chemotherapy induced neutropenia (HCC) 07/30/2023   Fatigue 07/16/2023   Situational anxiety 07/09/2023   Encounter for antineoplastic chemotherapy 07/09/2023   Dysphagia 07/09/2023   Port-A-Cath in place 07/05/2023   Sensorineural hearing loss (SNHL) of right ear 06/23/2023   Cancer associated pain 06/11/2023   Coordination of complex care 06/11/2023   Malignant neoplasm of tonsillar fossa (HCC) 06/08/2023   Malignant neoplasm of prostate (HCC) 07/17/2022   Hyperlipidemia 05/11/2021   Morbid obesity (HCC)-BMI 37.8 with DM II,HLD,OSA,HTN 05/09/2021   Hypertension, essential, benign 07/23/2020   OSA (obstructive sleep apnea) 07/23/2020   Type 2 diabetes mellitus with other specified complication (HCC) 01/31/2020   BPH associated with nocturia 01/31/2020   Hypothyroidism, postsurgical 01/31/2020     ONSET DATE: see pertinent history   REFERRING DIAG: Malignant Neoplasm of tonsillar fossa  THERAPY DIAG:  Dysphagia, unspecified type  Rationale for Evaluation and Treatment: Rehabilitation  SUBJECTIVE:   SUBJECTIVE STATEMENT: Since we started the Ensure I haven't had any trouble swallowing at all. We should have brought his Ensure. He gulps that down. 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 He presented to his PCP on 05/17/23 with complaints of right sided jaw/maxillary pain when coughing. 05/17/23 He underwent a CT neck which revealed an ulcerated enhancing mass involving the superior oropharynx and soft palate/uvula measuring 3.4 x 3.0 x 3.6 cm in the greatest extent that is  concerning for malignancy. No evidence of cervical lymphadenopathy was indicated on scan. 05/27/23 He saw Dr. Soldatova. She completed a laryngoscopy with biopsies which revealed Invasive moderately differentiated SCC, p 16 +. 06/04/23 visit with Dr. Soldatova for worsening symptoms of odynophagia and dysphagia. He was unable to maintain adequate nutrition. 06/07/23 PET revealing a right oropharyngeal tumor >4cm, likely b/l level II adenopathy, no distant metastases. Consult with Dr. Izell 06/08/23, patient reported extreme pain when swallowing that prevents him from eating or swallowing, PEG tube scheduled. 2/20 consult with Dr. Autumn. He will receive chemotherapy/radiation. Treatment plan:  He will receive 35 fractions of radiation to his right Tonsil and bilateral neck with weekly chemotherapy which started on 07/12/23 and will complete 08/27/23. Pretreatment procedures: PEG/PAC 06/18/23. MBSS 06/28/23. When he presented for consult he had severe dysphagia and PEG was placed quickly. He did recover from the dysphagia (patient thinks it was related to biopsy pain).  PAIN:  Are you having pain? No   FALLS: Has patient fallen in last 6 months?  No  PATIENT GOALS: Maintain WNL swallowing  OBJECTIVE:  Note: Objective measures were completed at Evaluation unless otherwise noted. DIAGNOSTIC FINDINGS: See Pertinent history above  INSTRUMENTAL SWALLOW STUDY FINDINGS (MBSS) 06/28/23 Clinical Impression: Patient presents with an oropharyngeal swallow that is largely [facility]. No penetration or aspiration occured during any phase of the swallow with any of the tested liquid or solid consistencies. Anterior hyoid excursion appeared partial in completion but patient with full epiglottic inversion and laryngeal vestibule closure. With solids, he did exhibit increased amount of vallecular residuals but with clearance with subsequent swallows and sips of liquids. PES opening appeared [facility] and no retrograde flow of barium observed  in upper esophagus. Trace amount of barium residue remained in oropharynx at level of known mass, however barium fully cleared with subsequent swallows. Barium tablet transit appeared to stall in distal esophagus.    SLP recommends referral to OP SLP secondary to planned oropharyngeal chemoradiation treatment.   Recommendations/Plan: Swallowing Evaluation Recommendations PO Diet Recommendation: Regular; Thin liquids (Level 0) Liquid Administration via: Cup; Straw Medication Administration: Whole meds with liquid Supervision: Patient able to self-feed Swallowing strategies  : Follow solids with liquids Postural changes: Position pt fully upright for meals Oral care recommendations: Oral care BID (2x/day)                                                                                                                            TREATMENT DATE:   01/04/24: Suggest MBS due to throat clearing  today with H2O and reported nasal regurgitation with milk. No overt s/sx aspiration PNA - pt has been drinking Ensure regularly since 12/28/23 and has cont to try different foods but dysgeusia makes this difficult. Today pt had applesauce and water  and consistently cleared throat (4/5 sips) with water . Applesauce appeared to clear better/WNL(?) through pharynx.  SLP thickened water  to consistency of Ensure and pt without throat clearing. Told pt/wife to thicken all liquids to Ensure consistency. Suggested they run humidifier at night as pt has thick mucous every morning that is very difficult to clear. SLP also suggested warm liquids (non-caffeinated) to assist with this in mornings, thickeded to nectar/Ensure thick. Lastly SLP encouraged pt to keep a food journal and provided examples, verbally.  With exercises pt req'd occasional min A for procedure. Has not been doing exercises - SLP told pt to compele 30/day of effortful, Masako, and Mendelson, and 6 of shaker. Pt was provided rationale for this.   12/02/23: Pt has  tried pinto beans, hash browns and fried egg, pancakes, and turnip greens. Likes Gatorade but not water . Does not have appetite - SLP suggested holding 1/2 of a feeding or two during the day to increase hunger level as pt stated if he were hungry he thinks he would try to eat more frequently and amount. Today he ate applesauce and drank H2O without any overt s/sx pharyngeal deficits, nor oral deficits. SLP reminded pt about food journal and suggested pt start one. SLP assisted pt making one with food he has tried thus far. Pt told SLP benefit of filling one out. SLP strongly encouraged pt to complete HEP and reminded pt of rationale.  11/02/23: Yesterday Dr. Izell prescribed Chloraseptic lozenges as well as spray. Pt and SO to pick up ASAP. [name] stated Dr. Izell told pt to discharge med for thrush.  Pt c/o pain as above but was willing to participate with SLP today. He has not completed HEP due to pain, minimal POs since ~ two weeks ago when thrush began. Prior to this was doing more liquids during the day. Pt with occasional coughing prior to POs. Pt took four 1/3 teaspoons water  today with delayed cough (15-30 seconds later) with 3/4 of them. Pt stated cough due to tickle and was unsure if a wrong pipe situation was felt with each of these. SLP suspects pt not swallowing with full ROM due to anticipation of pain increasing to 10/10. No overt s/sx aspiration PNA but SLP gave these to pt today with explanation if having these s/sx discontinue all PO asap and inform/notify MDs. Recommended pt have at least 10 ice chips at least 8 times/day to inhibit muscle disuse atrophy, encourage swallowing musculature. SLP told pt to perform HEP reps as he is able and provided rationale. SLP does not think MBS necessary at this time as pt's pain would skew results (suspect pt is not using full ROM), but would recommend when pt's pain is better managed, or pt has a more immediate cough response to POs, or pt has s/sx  aspiration PNA.   10/04/23: Pt with rough voice today. [name] reports he was very groggy and verbally hallucinating last night on Fentanyl  patch. She removed the patch and is picking up a lower dose today after pt obtains fluids at [facility]. He has had minimal PO daily. Today pt had multiple teaspoons of water  without overt s/sx aspiration. When he took one of three cup sips, pt had violent coughing. SLP told pt to have multiple teaspoons water  (  preferably warm), 8-10 times a day and explained rationale for this. SLP ascertained through having pt take different SIZE TEASPOON SIPS that pt's odynophagia is ASSOCIATED WITH SIP SIZE. SLP told pt to have teaspoon sips and pain did not incr greater than 3/10. With HEP, pt has not been completing as directed. SLP again reinforced rationale for HEP and pt's performance was WNL with rare min A for tongue protrusion. SLP told pt to perform with drops of water  as he needs. Breathing sounded slightly labored today but after pt coughed, breathing was not sounding as labored. Sixty seconds later pt's breathing became more labored again, but not as labored as originally. [name] reports that when he coughs and frees phlegm, it is hardened. SLP strongly suggested pt drink teaspoons of warm water  to more easily free dried phlegm (assumed) from pharynx. Wife is running humidifiers in the home.   09/02/23: Pt c/o pain in throat 10/10. Is not doing any reps of HEP currently, and has not done any since last ST session. Pt told SLP rationale of HEP with independence. Pt took 1/2-3/4 teaspoons water  today without s/sx oral, or overt s/sx pharyngeal deficits. SLP encouraged pt to have at least 2-3 teaspoons of water  at least 2-3 times/day and increase this as pt is able, up to small sips of water  multiple times per day. SLP reminded pt about muscle disuse atrophy and that this was possible due to the time pt has not had anything PO. SLP educated pt and SO about food journal.  SLP  demonstrated each exercise of HEP (Except Shaker) with pt and SO, and described a few examples of how pt could add HEP back into his daily routine, with goal being at least 20 reps of effortful, Masako, and Mendelsohn per day. SLP reiterated pt could perform multiple reps of Shaker without the need to swallow and encouraged pt to perform this exercise 9-12 times/day starting today.  07/22/23: Research states the risk for dysphagia increases due to radiation and/or chemotherapy treatment due to a variety of factors, so SLP educated the pt about the possibility of reduced/limited ability for PO intake during rad tx. SLP also educated pt regarding possible changes to swallowing musculature after rad tx, and why adherence to dysphagia HEP provided today and PO consumption was necessary to inhibit muscle fibrosis following rad tx and to mitigate muscle disuse atrophy. SLP informed pt why this would be detrimental to their swallowing status and to their pulmonary health. Pt demonstrated understanding of these things to SLP. SLP encouraged pt to safely eat and drink as deep into their radiation/chemotherapy as possible to provide the best possible long-term swallowing outcome for pt.  SLP then developed an individualized HEP for pt involving oral and pharyngeal strengthening and ROM and pt was instructed how to perform these exercises, including SLP demonstration. After SLP demonstration, pt return demonstrated each exercise. SLP ensured pt performance was correct prior to educating pt on next exercise. Pt required min-mod cues faded to modified independent to perform HEP. Pt was instructed to complete this program 5-7 days/week, at least 20 reps a day until 6 months after his last day of rad tx, and then x2/week after that, indefinitely. Among other modifications for days when pt cannot functionally swallow, SLP also suggested pt to perform only non-swallowing tasks on the handout/HEP, and if necessary to cycle through  the swallowing portion so the full program of exercises can be completed instead of fatiguing on one of the swallowing exercises and being unable  to perform the other swallowing exercises. SLP instructed that swallowing exercises should then be added back into the regimen as pt is able to do so.   PATIENT EDUCATION: Education details: late effects head/neck radiation on swallow function, HEP procedure, and modification to HEP when difficulty experienced with swallowing during and after radiation course Person educated: Patient and Caregiver [name] Education method: Explanation, Demonstration, Verbal cues, and Handouts Education comprehension: verbalized understanding, returned demonstration, verbal cues required, and needs further education   ASSESSMENT:  CLINICAL IMPRESSION: Patient is a 73 y.o. M who was seen today for treatment of swallowing after chemoradiation therapy. See treatment date for details on today's session. There are no overt s/s aspiration PNA observed by SLP nor any reported by pt at this time. Pt continues PEG dependent and is trying some foods, dysgeusia is hindering this; SLP assisted pt today stressing to have pt fill out a food journal for foods he has tried. Data indicate that pt's swallow ability could very well decline over time following the conclusion of rad therapy due to muscle disuse atrophy and/or muscle fibrosis. Pt will cont to need to be seen by SLP in order to assess safety of PO intake, assess the need for recommending any objective swallow assessment, and ensuring pt is correctly completing the individualized HEP.  OBJECTIVE IMPAIRMENTS: include dysphagia. These impairments are limiting patient from safety when swallowing. Factors affecting potential to achieve goals and functional outcome are none noted today. Patient will benefit from skilled SLP services to address above impairments and improve overall function.  REHAB POTENTIAL: Good   GOALS: Goals  reviewed with patient? No  SHORT TERM GOALS: Target: 3rd total session   1. Pt will compelte HEP with modified independence in 2 sessions Baseline: Goal status: not met   2.  pt will tell SLP why pt is completing HEP with modified independence Baseline:  Goal status: Met   3.  pt will describe 3 overt s/s aspiration PNA with modified independence Baseline:  Goal status: moved to LTG   4.  pt will tell SLP how a food journal could hasten return to a more normalized diet Baseline:  Goal status: Met     LONG TERM GOALS: Target: 7th total session   1.  pt will complete HEP with independence over two visits Baseline:  Goal status: INITIAL   2.  pt will describe how to modify HEP over time, and the timeline associated with reduction in HEP frequency with modified independence over two sessions Baseline:  Goal status: INITIAL  3.  pt will describe 3 overt s/s aspiration PNA with modified independence Baseline:  Goal status: met     PLAN:   SLP FREQUENCY:  once approx every 4 weeks   SLP DURATION:  7 sessions   PLANNED INTERVENTIONS: Aspiration precaution training, Pharyngeal strengthening exercises, Diet toleration management , Trials of upgraded texture/liquids, SLP instruction and feedback, Compensatory strategies, and Patient/family education, [phone] (treatment of swallowing dysfunction and/or oral function for feeding)   Azula Zappia, CCC-SLP 01/04/2024, 1:43 PM

## 2024-01-04 NOTE — Therapy (Signed)
 OUTPATIENT PHYSICAL THERAPY HEAD AND NECK POST RADIATION TREATMENT   Patient Name: Isaac Carpenter MRN: [id number] DOB:1951/01/22, 73 y.o., male Today's Date: 01/04/2024  END OF SESSION:  PT End of Session - 01/04/24 1157     Visit Number 8    Number of Visits 10    Date for PT Re-Evaluation 01/11/24    Authorization Type [employer]    Authorization Time Period 8/26-9/23    Authorization - Visit Number 7    Authorization - Number of Visits 9    PT Start Time 1108    PT Stop Time 1158    PT Time Calculation (min) 50 min    Activity Tolerance Patient tolerated treatment well    Behavior During Therapy WFL for tasks assessed/performed           Past Medical History:  Diagnosis Date   Arthritis    Cancer (HCC)    History of partial thyroidectomy STATES OVERACTIVE THYROID -- NO ISSUES SINCE AGE 89 AND NO MEDS   History of radiation therapy    07/12/23-09/03/23 Right Tonsil  Dr. Lauraine Golden   Hyperlipidemia    Hypertension    Hypothyroidism    Sleep apnea    Per patient he tried CPAP but could not tolerate.   Thyroid  disease    Past Surgical History:  Procedure Laterality Date   CIRCUMCISION  01/04/2012   Procedure: CIRCUMCISION ADULT;  Surgeon: Arlena LILLETTE Gal, MD;  Location: [facility];  Service: Urology;  Laterality: N/A;   COLONOSCOPY  04/08/2020   Vito Cirigliano   CYSTOSCOPY  09/04/2022   Procedure: CYSTOSCOPY;  Surgeon: Matilda Senior, MD;  Location: [facility];  Service: Urology;;   IR GASTROSTOMY TUBE MOD SED  06/18/2023   IR IMAGING GUIDED PORT INSERTION  06/18/2023   RADIOACTIVE SEED IMPLANT N/A 09/04/2022   Procedure: RADIOACTIVE SEED IMPLANT/BRACHYTHERAPY IMPLANT;  Surgeon: Matilda Senior, MD;  Location: [facility];  Service: Urology;  Laterality: N/A;  90 MINS   SPACE OAR INSTILLATION N/A 09/04/2022   Procedure: SPACE OAR INSTILLATION;  Surgeon: Matilda Senior, MD;  Location: [facility];  Service: Urology;  Laterality: N/A;   THYROIDECTOMY, PARTIAL  AGE 89   OVERACTIVE THYROID    Patient Active Problem List   Diagnosis Date Noted   Cancer (HCC) 08/14/2023   Febrile neutropenia (HCC) 08/14/2023   S/P percutaneous endoscopic gastrostomy (PEG) tube placement (HCC) 08/14/2023   Hyponatremia 08/14/2023   Normocytic anemia 08/14/2023   Chemotherapy induced neutropenia (HCC) 07/30/2023   Fatigue 07/16/2023   Situational anxiety 07/09/2023   Encounter for antineoplastic chemotherapy 07/09/2023   Dysphagia 07/09/2023   Port-A-Cath in place 07/05/2023   Sensorineural hearing loss (SNHL) of right ear 06/23/2023   Cancer associated pain 06/11/2023   Coordination of complex care 06/11/2023   Malignant neoplasm of tonsillar fossa (HCC) 06/08/2023   Malignant neoplasm of prostate (HCC) 07/17/2022   Hyperlipidemia 05/11/2021   Morbid obesity (HCC)-BMI 37.8 with DM II,HLD,OSA,HTN 05/09/2021   Hypertension, essential, benign 07/23/2020   OSA (obstructive sleep apnea) 07/23/2020   Type 2 diabetes mellitus with other specified complication (HCC) 01/31/2020   BPH associated with nocturia 01/31/2020   Hypothyroidism, postsurgical 01/31/2020    PCP: Betty [name], MD   REFERRING PROVIDER: Lauraine Golden, MD   REFERRING DIAG: C09.0 (ICD-10-CM) - Malignant neoplasm of tonsillar fossa (HCC)   THERAPY DIAG:  Lymphedema, not elsewhere classified  Abnormal posture  Cough, unspecified type  Malignant neoplasm of tonsillar  fossa [facility])  Rationale for Evaluation and Treatment: Rehabilitation  ONSET DATE: 05/27/23  SUBJECTIVE:                                                                                                                                                                                           SUBJECTIVE STATEMENT: My wife does the self massage, but I can't do it very well. I have been able to take things in by mouth now. I have been able to drink the Ensure 1  and a half 3 times a day and don't have to use my feeding tube. Food still doesn't taste good, but the tongue and throat burning is gone. I can eat pintos and white beans pretty well. 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 I have not been able to eat. I can not even get to the point of swallowing it. In the last week I have not had a problem with the phlegm at night but when I get up it sounds like my throat is full of phlegm and now I can not get it up.   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 He presented to his PCP on 05/17/23 with complaints of right sided jaw/maxillary pain when coughing. 05/17/23 He underwent a CT neck which revealed an ulcerated enhancing mass involving the superior oropharynx and soft palate/uvula measuring 3.4 x 3.0 x 3.6 cm in the greatest extent that is concerning for malignancy. No evidence of cervical lymphadenopathy was indicated on scan. 05/27/23 He saw Dr. Soldatova. She completed a laryngoscopy with biopsies which revealed Invasive moderately differentiated SCC, p 16 +. 06/04/23 visit with Dr. Soldatova for worsening symptoms of odynophagia and dysphagia. He was unable to maintain adequate nutrition. 06/07/23 PET revealing a right oropharyngeal tumor >4cm, likely b/l level II adenopathy, no distant metastases. He will receive 35 fractions of radiation to his right Tonsil and bilateral neck with weekly chemotherapy which started on 07/12/23 and will complete 08/27/23. PEG/PAC 2/28/25MBSS 06/28/23. When he presented for consult he had severe dysphagia and PEG was placed quickly. He did recover from the dysphagia (patient thinks it was related to biopsy pain).   PATIENT GOALS:  Reassess how my recovery is going related to neck ROM, cervical pain, fatigue, and swelling.  PAIN:  Are you having pain? No  PRECAUTIONS: Recent radiation, Head and neck lymphedema risk,    OBJECTIVE:   POSTURE:  Forward head and rounded  shoulders posture  30 SEC SIT TO STAND: 12/14/23: 10 reps in 30 sec without use of UEs which is below average  for patient's age At eval 07/22/23: 10 reps in 30 sec without use of UEs which is  Below average for patient's age.   SHOULDER AROM:   Limited bilaterally but functional      CERVICAL AROM:     Percent limited 07/22/23 eval 12/14/23  Flexion WFL 25% limited  Extension 50% limited 50% limited  Right lateral flexion 75% limited 75% limited  Left lateral flexion 75% limited 75% limited  Right rotation 50% limited 50% limited  Left rotation 50% limited 50% limited                          (Blank rows=not tested)  LYMPHEDEMA ASSESSMENT:    Circumference in cm 12/30/23  4 cm superior to sternal notch around neck 47 47  6 cm superior to sternal notch around neck 48 48.4  8 cm superior to sternal notch around neck 48.5 49.5  R lateral nostril from base of nose to medial tragus    L lateral nostril from base of nose to medial tragus    R corner of mouth to where ear lobe meets face    L corner of mouth to where ear lobe meets face            (Blank rows=not tested)   OTHER SYMPTOMS: Pain No Fibrosis Yes Pitting edema Yes Infections No Decreased scar mobility No    TREATMENT TODAY 01/04/2024 Discussed theraband postural exercises; pt is compliant and felt he is doing correctly. Can increase to 2 x 10 as tolerated Read instructions and explained to pt as I performed so that he may be able to help his wife with her technique at home. Continued MLD to anterior neck in supine as follows: short neck, 5 diaphragmatic breaths, bilateral axillary nodes, bilateral pectoral nodes, anterior chest, short neck, posterior neck moving fluid towards pathway aimed at lateral neck, then lateral and anterior neck moving fluid towards pathway aimed at lateral neck then retracing all steps  Advised pt to check his neck prior to putting on his garment and when he takes it off so he can let us  know  how it is doing. He can not tolerate the straps over his head, 12/30/23: Since pt is not able to tolerate his head and neck garment with strap on top of his head due to claustrophobia, therapist cut back edges of garment around face. Pt reported he thought is claustrophobia was caused by decreased vision from the garment. Therapist trimmed and re donned but pt felt panicked. Educated pt to try again at home but if he is unable to at least just use the strap behind the neck. Circumferences remeasured - they have increased some Continued MLD to anterior neck in supine as follows: short neck, 5 diaphragmatic breaths, bilateral axillary nodes, bilateral pectoral nodes, anterior chest, short neck, posterior neck moving fluid towards pathway aimed at lateral neck, then lateral and anterior neck moving fluid towards pathway aimed at lateral neck then retracing all steps    12/28/2023 Discussed progress with pt. May try [employer]aires foam pack under garment to see if it helps Educated in postural theraband exs; Scapular retraction, shoulder extension, B. ER all with Green band. Updated HEP with pics and band Pt sitting in chair instructed in [place] anterior approach as follows: Caution with portacath, short neck, 5 diaphragmatic breaths, bilateral axillary nodes, bilateral pectoral nodes, anterior chest, short neck, posterior neck moving fluid towards pathway aimed at lateral neck, then lateral and  anterior neck moving fluid towards pathway aimed at lateral neck then retracing all steps.  Had pt return demo each step of the cervical treatment while therapist provided v/c and t/c for correct direction of stretch, sequence and skin stretch technique. Therapist then placed pt on table and therapist performed all steps in reverse.  12/23/2023 Donned pts Tribute properly and within a minute or 2 he felt claustrophobic. Removed the head strap and he was able to tolerated the neck part snugged up pretty well. He will try it at  home while awake using just the neck part, and we may be able to transition to the head part later.  [place] anterior approach as follows: Therapist performed [facility] anterior approach handout as follows: short neck, 5 diaphragmatic breaths, bilateral axillary nodes, bilateral pectoral nodes, anterior chest, short neck, posterior neck moving fluid towards pathway aimed at lateral neck, then lateral and anterior neck moving fluid towards pathway aimed at lateral neck then retracing all steps. Then had pt's spouse return demo each step of the cervical treatment while therapist provided v/c and t/c for correct direction of stretch, sequence and skin stretch technique. She watched therapist perform sequence in reverse.  12/22/23: Assessed fit of tribute head and neck garment. It was a little tight at the back of the neck so added an extender strap with Velcro and pt reports increased comfort with this.  Instructed pt's spouse in MLD technique using [place] anterior approach as follows: Instructed pt using [place] anterior approach handout as follows: short neck, 5 diaphragmatic breaths, bilateral axillary nodes, bilateral pectoral nodes, anterior chest, short neck, posterior neck moving fluid towards pathway aimed at lateral neck, then lateral and anterior neck moving fluid towards pathway aimed at lateral neck then retracing all steps. Had pt's spouse return demo each step while therapist provided v/c and t/c for correct direction of stretch, sequence and skin stretch technique. After educating pt in entire sequence therapist focused on lymphedema at anterior neck then retraced all steps.    12/16/2023  Assisted pts wife with ordering garment on [employer] since she couldn't get the site to come up on her phone; Gave print out of order Educated pt and his wife on lymphatics, and the gentle nature of technique and role of MLD in decreasing edema. Therapist performed [facility] anterior approach handout as follows with  caution on right due to portacath: short neck, 5 diaphragmatic breaths, bilateral axillary nodes, bilateral pectoral nodes, anterior chest, short neck, posterior neck moving fluid towards pathway aimed at lateral neck, then lateral and anterior neck moving fluid towards pathway aimed at lateral neck then retracing all steps. Pts wife observed and then practiced MLD to left supraclavicular region only due to portacath, axillary and pectoral LN's, chest and briefly to neck region. She was given a written handout.   PATIENT EDUCATION:  Education details: anatomy and physiology of the lymphatic system, how lymphedema is managed through self MLD and compression, head and neck tribute Radiation protection practitioner educated: Patient and Spouse Education method: Medical illustrator Education comprehension: verbalized understanding  HOME EXERCISE PROGRAM: Reviewed previously given post op HEP. Wear chip pack at least 4 to 5 hours per day  ASSESSMENT:  CLINICAL IMPRESSION: Pt is still unable to tolerate the straps of the garment over his head, but is getting it on snugly. Adivsed him to check swelling before and after donning so we know how it is doing. Reviewed manual techniques while reading instructions to pt.. Significant swelling still present at anterior neck.  EVAL Pt returns to PT after completing chemo and radiation for treatment of tonsillar cancer. He reports he has had a difficult time recovering. He is still unable to eat and is relying on his feeding tube. He has difficulty swallowing. He has lymphedema at his anterior throat with fibrosis present. His neck ROM is the same as baseline which is limited in all directions. His neck flexion was [facility] at baseline but now is limited possibly due to his lymphedema. He would benefit from skilled PT services to decrease anterior neck lymphedema to allow improved swallowing and decrease risk of infection and to improve cervical flexion ROM and posture.   Pt  will benefit from skilled therapeutic intervention to improve on the following deficits: decreased knowledge of condition, decreased knowledge of use of DME, decreased ROM, increased edema, increased fascial restrictions, and postural dysfunction  PT treatment/interventions: ADL/Self care home management, [phone]- PT Re-evaluation, 97110-Therapeutic exercises, 97530- Therapeutic activity, [id number]- Neuromuscular re-education, 97535- Self Care, [id number]- Manual therapy, [id number]- Orthotic Initial, and [id number]- Orthotic/Prosthetic subsequent     GOALS: Goals reviewed with patient? Yes  LONG TERM GOALS:  (STG=LTG)  GOALS Name Target Date  Goal status  1 Pt will demonstrate a return to baseline cervical ROM measurements and not demonstrate any signs or symptoms of lymphedema. Baseline: 01/11/24 INITIAL  2 Pt and/or spouse will be independent in self MLD for long term management of lymphedema. 01/11/24  NEW  3 Pt will obtain a compression garment for long term management of lymphedema. 01/11/24  NEW  4 Pt will demonstrate a 1 cm decrease in edema at 8 cm superior to sternal notch to decrease risk of infection. 01/11/24  NEW       PLAN:  PT FREQUENCY/DURATION: 2x/wk for 4 wks  PLAN FOR NEXT SESSION: Did he try garment without the head strap?Review MLD with pt/wife, MLD and give handout,    [facility]  [address], [address]  [place] [address]  [phone]   Scar massage You can begin gentle scar massage to you incision sites. Gently place one hand on the incision and move the skin (without sliding on the skin) in various directions. Do this for a few minutes and then you can gently massage either coconut oil or vitamin E cream into the scars.  Home exercise Program Continue doing the exercises you were given until you feel like you can do them without feeling any tightness at the end. It is best to do them for several months after completion of radiation since the effects  of radiation continue past completion.   Walking Program Studies show that 30 minutes of walking per day (fast enough to elevate your heart rate) can significantly reduce the risk of a cancer recurrence. If you can't walk due to other medical reasons, we encourage you to find another activity you could do (like a stationary bike or water  exercise).  Posture After treatment for head and neck cancer, people frequently sit with rounded shoulders and forward head posture because the front of the neck has become tight and it feels better. If you sit like this, you can become very tight and have pain in sitting or standing with good posture. Try to be aware of your posture and sit and stand up tall to heal properly.  Follow up PT: Please let you doctor know as soon as possible if you develop any swelling in your face or neck in the future. Lymphedema (swelling) can occur months after completion  of radiation. The sooner you can let the doctor know, the sooner they can refer you back to PT. It is much easier to treat the swelling early on.   Grayce JINNY Sheldon, PT 01/04/2024, 11:58 AM

## 2024-01-05 ENCOUNTER — Other Ambulatory Visit (HOSPITAL_COMMUNITY): Payer: Self-pay | Admitting: Radiation Oncology

## 2024-01-05 DIAGNOSIS — R1310 Dysphagia, unspecified: Secondary | ICD-10-CM

## 2024-01-05 DIAGNOSIS — R059 Cough, unspecified: Secondary | ICD-10-CM

## 2024-01-06 ENCOUNTER — Ambulatory Visit

## 2024-01-06 DIAGNOSIS — C090 Malignant neoplasm of tonsillar fossa: Secondary | ICD-10-CM

## 2024-01-06 DIAGNOSIS — R293 Abnormal posture: Secondary | ICD-10-CM

## 2024-01-06 DIAGNOSIS — I890 Lymphedema, not elsewhere classified: Secondary | ICD-10-CM

## 2024-01-06 DIAGNOSIS — R059 Cough, unspecified: Secondary | ICD-10-CM | POA: Diagnosis not present

## 2024-01-06 NOTE — Therapy (Signed)
 OUTPATIENT PHYSICAL THERAPY HEAD AND NECK POST RADIATION TREATMENT   Patient Name: Isaac Carpenter MRN: [id number] DOB:Aug 12, 1950, 73 y.o., male Today's Date: 01/06/2024  END OF SESSION:  PT End of Session - 01/06/24 1105     Visit Number 9    Number of Visits 11    Date for Recertification  01/27/24    Authorization Type [employer] Medicare    Authorization Time Period 8/26-9/23    Authorization - Visit Number 8    Authorization - Number of Visits 9    PT Start Time 1105    PT Stop Time 1200    PT Time Calculation (min) 55 min    Activity Tolerance Patient tolerated treatment well    Behavior During Therapy WFL for tasks assessed/performed           Past Medical History:  Diagnosis Date   Arthritis    Cancer (HCC)    History of partial thyroidectomy STATES OVERACTIVE THYROID -- NO ISSUES SINCE AGE 28 AND NO MEDS   History of radiation therapy    07/12/23-09/03/23 Right Tonsil  Dr. Lauraine Golden   Hyperlipidemia    Hypertension    Hypothyroidism    Sleep apnea    Per patient he tried CPAP but could not tolerate.   Thyroid  disease    Past Surgical History:  Procedure Laterality Date   CIRCUMCISION  01/04/2012   Procedure: CIRCUMCISION ADULT;  Surgeon: Arlena LILLETTE Gal, MD;  Location: [facility];  Service: Urology;  Laterality: N/A;   COLONOSCOPY  04/08/2020   Vito Cirigliano   CYSTOSCOPY  09/04/2022   Procedure: CYSTOSCOPY;  Surgeon: Matilda Senior, MD;  Location: [facility];  Service: Urology;;   IR GASTROSTOMY TUBE MOD SED  06/18/2023   IR IMAGING GUIDED PORT INSERTION  06/18/2023   RADIOACTIVE SEED IMPLANT N/A 09/04/2022   Procedure: RADIOACTIVE SEED IMPLANT/BRACHYTHERAPY IMPLANT;  Surgeon: Matilda Senior, MD;  Location: [facility];  Service: Urology;  Laterality: N/A;  90 MINS   SPACE OAR INSTILLATION N/A 09/04/2022   Procedure: SPACE OAR INSTILLATION;  Surgeon: Matilda Senior, MD;  Location: [facility];  Service: Urology;  Laterality: N/A;   THYROIDECTOMY, PARTIAL  AGE 28   OVERACTIVE THYROID    Patient Active Problem List   Diagnosis Date Noted   Cancer (HCC) 08/14/2023   Febrile neutropenia (HCC) 08/14/2023   S/P percutaneous endoscopic gastrostomy (PEG) tube placement (HCC) 08/14/2023   Hyponatremia 08/14/2023   Normocytic anemia 08/14/2023   Chemotherapy induced neutropenia (HCC) 07/30/2023   Fatigue 07/16/2023   Situational anxiety 07/09/2023   Encounter for antineoplastic chemotherapy 07/09/2023   Dysphagia 07/09/2023   Port-A-Cath in place 07/05/2023   Sensorineural hearing loss (SNHL) of right ear 06/23/2023   Cancer associated pain 06/11/2023   Coordination of complex care 06/11/2023   Malignant neoplasm of tonsillar fossa (HCC) 06/08/2023   Malignant neoplasm of prostate (HCC) 07/17/2022   Hyperlipidemia 05/11/2021   Morbid obesity (HCC)-BMI 37.8 with DM II,HLD,OSA,HTN 05/09/2021   Hypertension, essential, benign 07/23/2020   OSA (obstructive sleep apnea) 07/23/2020   Type 2 diabetes mellitus with other specified complication (HCC) 01/31/2020   BPH associated with nocturia 01/31/2020   Hypothyroidism, postsurgical 01/31/2020    PCP: Betty [name], MD   REFERRING PROVIDER: Lauraine Golden, MD   REFERRING DIAG: C09.0 (ICD-10-CM) - Malignant neoplasm of tonsillar fossa (HCC)   THERAPY DIAG:  Lymphedema, not elsewhere classified  Abnormal posture  Malignant neoplasm of tonsillar fossa (HCC)  Rationale  for Evaluation and Treatment: Rehabilitation  ONSET DATE: 05/27/23  SUBJECTIVE:                                                                                                                                                                                           SUBJECTIVE STATEMENT: I wear my garment 5- 6 hours a day. My wife could tell a difference in swelling when I took it off. I was able to eat a little of a ham sandwich and not have to spit it out like  I did last time. Things in general still don't taste right. [name] was not able to come today. She has been doing the Manual Lymph drainage, but she questions if she is doing it right. It would help to have her review it again. I should be able to get my feeding tube out in 2-3 weeks.  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 I have not been able to eat. I can not even get to the point of swallowing it. In the last week I have not had a problem with the phlegm at night but when I get up it sounds like my throat is full of phlegm and now I can not get it up.   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 He presented to his PCP on 05/17/23 with complaints of right sided jaw/maxillary pain when coughing. 05/17/23 He underwent a CT neck which revealed an ulcerated enhancing mass involving the superior oropharynx and soft palate/uvula measuring 3.4 x 3.0 x 3.6 cm in the greatest extent that is concerning for malignancy. No evidence of cervical lymphadenopathy was indicated on scan. 05/27/23 He saw Dr. Soldatova. She completed a laryngoscopy with biopsies which revealed Invasive moderately differentiated SCC, p 16 +. 06/04/23 visit with Dr. Soldatova for worsening symptoms of odynophagia and dysphagia. He was unable to maintain adequate nutrition. 06/07/23 PET revealing a right oropharyngeal tumor >4cm, likely b/l level II adenopathy, no distant metastases. He will receive 35 fractions of radiation to his right Tonsil and bilateral neck with weekly chemotherapy which started on 07/12/23 and will complete 08/27/23. PEG/PAC 2/28/25MBSS 06/28/23. When he presented for consult he had severe dysphagia and PEG was placed quickly. He did recover from the dysphagia (patient thinks it was related to biopsy pain).   PATIENT GOALS:  Reassess how my recovery is going related to neck ROM, cervical pain, fatigue, and swelling.  PAIN:  Are you having pain? No  PRECAUTIONS:  Recent radiation, Head and neck lymphedema risk,    OBJECTIVE:   POSTURE:  Forward  head and rounded shoulders posture  30 SEC SIT TO STAND: 12/14/23: 10 reps in 30 sec without use of UEs which is below average for patient's age At eval 07/22/23: 10 reps in 30 sec without use of UEs which is  Below average for patient's age.   SHOULDER AROM:   Limited bilaterally but functional      CERVICAL AROM:     Percent limited 07/22/23 eval 12/14/23 01/06/2024  Flexion WFL 25% limited 25 %  Extension 50% limited 50% limited 25% limit  Right lateral flexion 75% limited 75% limited 75% limited  Left lateral flexion 75% limited 75% limited 75% limited  Right rotation 50% limited 50% limited 50%  Left rotation 50% limited 50% limited 50%                          (Blank rows=not tested)  LYMPHEDEMA ASSESSMENT:    Circumference in cm 12/30/23 01/05/1014  4 cm superior to sternal notch around neck 47 47 47  6 cm superior to sternal notch around neck 48 48.4 47  8 cm superior to sternal notch around neck 48.5 49.5 48.2  R lateral nostril from base of nose to medial tragus     L lateral nostril from base of nose to medial tragus     R corner of mouth to where ear lobe meets face     L corner of mouth to where ear lobe meets face               (Blank rows=not tested)   OTHER SYMPTOMS: Pain No Fibrosis Yes Pitting edema Yes Infections No Decreased scar mobility No    TREATMENT TODAY Measured pt for recert and assesed goals Reviewed Norton anterior approach with pt in sitting. Pt performed all techniques with intermittent VC's and hand over hand cueing MLD to anterior neck in supine as follows: short neck, 5 diaphragmatic breaths, bilateral axillary nodes, bilateral pectoral nodes, anterior chest, short neck, posterior neck moving fluid towards pathway aimed at lateral neck, then lateral and anterior neck moving fluid towards pathway aimed at lateral neck then retracing all steps  Pts wife not  able to be present today due to other appt but would like to review   01/04/2024 Discussed theraband postural exercises; pt is compliant and felt he is doing correctly. Can increase to 2 x 10 as tolerated Read instructions and explained to pt as I performed so that he may be able to help his wife with her technique at home. Continued MLD to anterior neck in supine as follows: short neck, 5 diaphragmatic breaths, bilateral axillary nodes, bilateral pectoral nodes, anterior chest, short neck, posterior neck moving fluid towards pathway aimed at lateral neck, then lateral and anterior neck moving fluid towards pathway aimed at lateral neck then retracing all steps  Advised pt to check his neck prior to putting on his garment and when he takes it off so he can let us  know how it is doing. He can not tolerate the straps over his head, 12/30/23: Since pt is not able to tolerate his head and neck garment with strap on top of his head due to claustrophobia, therapist cut back edges of garment around face. Pt reported he thought is claustrophobia was caused by decreased vision from the garment. Therapist trimmed and re donned but pt felt panicked. Educated pt to try again at home but if he is unable to at least just use the strap behind  the neck. Circumferences remeasured - they have increased some Continued MLD to anterior neck in supine as follows: short neck, 5 diaphragmatic breaths, bilateral axillary nodes, bilateral pectoral nodes, anterior chest, short neck, posterior neck moving fluid towards pathway aimed at lateral neck, then lateral and anterior neck moving fluid towards pathway aimed at lateral neck then retracing all steps    12/28/2023 Discussed progress with pt. May try Blaires foam pack under garment to see if it helps Educated in postural theraband exs; Scapular retraction, shoulder extension, B. ER all with Green band. Updated HEP with pics and band Pt sitting in chair instructed in [place]  anterior approach as follows: Caution with portacath, short neck, 5 diaphragmatic breaths, bilateral axillary nodes, bilateral pectoral nodes, anterior chest, short neck, posterior neck moving fluid towards pathway aimed at lateral neck, then lateral and anterior neck moving fluid towards pathway aimed at lateral neck then retracing all steps.  Had pt return demo each step of the cervical treatment while therapist provided v/c and t/c for correct direction of stretch, sequence and skin stretch technique. Therapist then placed pt on table and therapist performed all steps in reverse.  12/23/2023 Donned pts Tribute properly and within a minute or 2 he felt claustrophobic. Removed the head strap and he was able to tolerated the neck part snugged up pretty well. He will try it at home while awake using just the neck part, and we may be able to transition to the head part later.  Norton anterior approach as follows: Therapist performed [facility] anterior approach handout as follows: short neck, 5 diaphragmatic breaths, bilateral axillary nodes, bilateral pectoral nodes, anterior chest, short neck, posterior neck moving fluid towards pathway aimed at lateral neck, then lateral and anterior neck moving fluid towards pathway aimed at lateral neck then retracing all steps. Then had pt's spouse return demo each step of the cervical treatment while therapist provided v/c and t/c for correct direction of stretch, sequence and skin stretch technique. She watched therapist perform sequence in reverse.  12/22/23: Assessed fit of tribute head and neck garment. It was a little tight at the back of the neck so added an extender strap with Velcro and pt reports increased comfort with this.  Instructed pt's spouse in MLD technique using Norton anterior approach as follows: Instructed pt using Norton anterior approach handout as follows: short neck, 5 diaphragmatic breaths, bilateral axillary nodes, bilateral pectoral nodes, anterior  chest, short neck, posterior neck moving fluid towards pathway aimed at lateral neck, then lateral and anterior neck moving fluid towards pathway aimed at lateral neck then retracing all steps. Had pt's spouse return demo each step while therapist provided v/c and t/c for correct direction of stretch, sequence and skin stretch technique. After educating pt in entire sequence therapist focused on lymphedema at anterior neck then retraced all steps.    12/16/2023  Assisted pts wife with ordering garment on [employer] since she couldn't get the site to come up on her phone; Gave print out of order Educated pt and his wife on lymphatics, and the gentle nature of technique and role of MLD in decreasing edema. Therapist performed [facility] anterior approach handout as follows with caution on right due to portacath: short neck, 5 diaphragmatic breaths, bilateral axillary nodes, bilateral pectoral nodes, anterior chest, short neck, posterior neck moving fluid towards pathway aimed at lateral neck, then lateral and anterior neck moving fluid towards pathway aimed at lateral neck then retracing all steps. Pts wife observed and then practiced MLD  to left supraclavicular region only due to portacath, axillary and pectoral LN's, chest and briefly to neck region. She was given a written handout.   PATIENT EDUCATION:  Education details: anatomy and physiology of the lymphatic system, how lymphedema is managed through self MLD and compression, head and neck tribute Radiation protection practitioner educated: Patient and Spouse Education method: Medical illustrator Education comprehension: verbalized understanding  HOME EXERCISE PROGRAM: Reviewed previously given post op HEP. Wear chip pack at least 4 to 5 hours per day  ASSESSMENT:  CLINICAL IMPRESSION: Pt has received his tribute head and neck garment to decrease edema, however he can not tolerate the straps over his head due to claustrophobia. He is able to wear it 5-6  hours a day and is getting benefit with the swelling. His wife is trying to do the manual lymphatic drainage however, she has not been able to come for the last couple of visits. Pt did quite well return demonstrating lymphatic drainage today, but does have a tendency to slide, and starts off with a little too much pressure, and improves with practice. He would benefit from several more sessions to allow he and his wife to be proficient with manual lymphatic drainage. He is very compliant with his garment and HEP for postural strengthening and will be more confident with MLD with a few sessions to practice.  EVAL Pt returns to PT after completing chemo and radiation for treatment of tonsillar cancer. He reports he has had a difficult time recovering. He is still unable to eat and is relying on his feeding tube. He has difficulty swallowing. He has lymphedema at his anterior throat with fibrosis present. His neck ROM is the same as baseline which is limited in all directions. His neck flexion was [facility] at baseline but now is limited possibly due to his lymphedema. He would benefit from skilled PT services to decrease anterior neck lymphedema to allow improved swallowing and decrease risk of infection and to improve cervical flexion ROM and posture.   Pt will benefit from skilled therapeutic intervention to improve on the following deficits: decreased knowledge of condition, decreased knowledge of use of DME, decreased ROM, increased edema, increased fascial restrictions, and postural dysfunction  PT treatment/interventions: ADL/Self care home management, [phone]- PT Re-evaluation, 97110-Therapeutic exercises, 97530- Therapeutic activity, [id number]- Neuromuscular re-education, 97535- Self Care, 02859- Manual therapy, [id number]- Orthotic Initial, and [id number]- Orthotic/Prosthetic subsequent     GOALS: Goals reviewed with patient? Yes  LONG TERM GOALS:  (STG=LTG)  GOALS Name Target Date  Goal status  1 Pt will  demonstrate a return to baseline cervical ROM measurements and not demonstrate any signs or symptoms of lymphedema. Baseline: 01/11/24 MET except flexion  2 Pt and/or spouse will be independent in self MLD for long term management of lymphedema. 01/11/24 01/27/2024  IN PROGRESS  3 Pt will obtain a compression garment for long term management of lymphedema. 01/11/24 9/18/202  MET   4 Pt will demonstrate a 1 cm decrease in edema at 8 cm superior to sternal notch to decrease risk of infection. 01/11/24 01/27/2024   In PROGRESS       PLAN:  PT FREQUENCY/DURATION: 1x/week , (2 visits in 3 weeks)  PLAN FOR NEXT SESSION: Did he try garment without the head strap?Review MLD with pt/wife, MLD and give handout,    [facility]  [address]  [place] [address]  [phone]   Scar massage You can begin gentle scar massage to you incision  sites. Gently place one hand on the incision and move the skin (without sliding on the skin) in various directions. Do this for a few minutes and then you can gently massage either coconut oil or vitamin E cream into the scars.  Home exercise Program Continue doing the exercises you were given until you feel like you can do them without feeling any tightness at the end. It is best to do them for several months after completion of radiation since the effects of radiation continue past completion.   Walking Program Studies show that 30 minutes of walking per day (fast enough to elevate your heart rate) can significantly reduce the risk of a cancer recurrence. If you can't walk due to other medical reasons, we encourage you to find another activity you could do (like a stationary bike or water  exercise).  Posture After treatment for head and neck cancer, people frequently sit with rounded shoulders and forward head posture because the front of the neck has become tight and it feels better. If you sit like this, you can become very  tight and have pain in sitting or standing with good posture. Try to be aware of your posture and sit and stand up tall to heal properly.  Follow up PT: Please let you doctor know as soon as possible if you develop any swelling in your face or neck in the future. Lymphedema (swelling) can occur months after completion of radiation. The sooner you can let the doctor know, the sooner they can refer you back to PT. It is much easier to treat the swelling early on.   Grayce JINNY Sheldon, PT 01/06/2024, 2:22 PM

## 2024-01-11 ENCOUNTER — Inpatient Hospital Stay: Attending: Nurse Practitioner | Admitting: Dietician

## 2024-01-11 ENCOUNTER — Telehealth: Payer: Self-pay | Admitting: Dietician

## 2024-01-11 NOTE — Telephone Encounter (Signed)
 Attempted to contact patient for nutrition follow-up. Unable to leave message. VM is full. Will continue efforts to contact patient as schedule allows today.

## 2024-01-11 NOTE — Telephone Encounter (Signed)
 Nutrition Follow-up:  Pt with stage II SCC of right tonsil, p16 positive. He completed concurrent chemoradiation (first chemo 3/21; final RT 5/16). Patient is under the care of Dr. Izell and Dr. Autumn.    S/p Gtube 2/28 - replaced at [facility] with non Enfit FT 5/28 DME: Alfreda Sermon with wife of patient this afternoon. [place] are currently at the beach. Wife apologizing for missing call this morning. Patient has stopped using tube. He is drinking 6 Ensure Plus. Foods continue to taste nasty but wife continues encouraging po trials. Says he tried a hotdog, however this was terrible. Planning to cook fresh flounder while at the beach. Patient asking if he could have tube out without tolerating more variety in diet.    Medications: reviewed   Labs: no new labs   Anthropometrics: Last wt 246 lb 9.6 oz on 9/9 decreased from 248 lb 4.8 oz on 8/20    NUTRITION DIAGNOSIS: Unintended wt loss - unable to assess at this time   INTERVENTION:  Discussed sole reliance on Ensure for nutrition is likely not sustainable long term (suspect he would grow tired of consuming + cost). Would prefer pt continue working to increase variety before recommending tube d/c to Dr. Autumn - wife and pt understanding of this Continue daily FWF Support and encouragement     MONITORING, EVALUATION, GOAL: wt trends, intake   NEXT VISIT: Thursday October 2 via telephone

## 2024-01-17 ENCOUNTER — Ambulatory Visit (HOSPITAL_COMMUNITY)
Admission: RE | Admit: 2024-01-17 | Discharge: 2024-01-17 | Disposition: A | Discharge status: Home / Self Care | Source: Ambulatory Visit | Attending: Radiation Oncology | Admitting: Radiation Oncology

## 2024-01-17 ENCOUNTER — Ambulatory Visit (HOSPITAL_COMMUNITY)
Admission: RE | Admit: 2024-01-17 | Discharge: 2024-01-17 | Disposition: A | Discharge status: Home / Self Care | Source: Ambulatory Visit | Attending: Family Medicine | Admitting: Family Medicine

## 2024-01-17 ENCOUNTER — Other Ambulatory Visit: Payer: Self-pay

## 2024-01-17 ENCOUNTER — Other Ambulatory Visit (HOSPITAL_COMMUNITY): Payer: Self-pay

## 2024-01-17 DIAGNOSIS — R1310 Dysphagia, unspecified: Secondary | ICD-10-CM | POA: Diagnosis not present

## 2024-01-17 DIAGNOSIS — R1313 Dysphagia, pharyngeal phase: Secondary | ICD-10-CM | POA: Insufficient documentation

## 2024-01-17 DIAGNOSIS — Z9221 Personal history of antineoplastic chemotherapy: Secondary | ICD-10-CM | POA: Diagnosis not present

## 2024-01-17 DIAGNOSIS — R059 Cough, unspecified: Secondary | ICD-10-CM | POA: Diagnosis present

## 2024-01-17 DIAGNOSIS — C090 Malignant neoplasm of tonsillar fossa: Secondary | ICD-10-CM

## 2024-01-17 DIAGNOSIS — C099 Malignant neoplasm of tonsil, unspecified: Secondary | ICD-10-CM | POA: Insufficient documentation

## 2024-01-17 DIAGNOSIS — Z923 Personal history of irradiation: Secondary | ICD-10-CM | POA: Diagnosis not present

## 2024-01-17 NOTE — Progress Notes (Addendum)
 I have reviewed and concur with this student's documentation.   Norleen IVAR Blase, MA, CCC-SLP Speech Therapy  01/17/2024 4:18 PM    Modified Barium Swallow Study  Patient Details  Name: Isaac Carpenter MRN: [id number] Date of Birth: October 18, 1950  Today's Date: 01/17/2024  Modified Barium Swallow completed.  Full report located under Chart Review in the Imaging Section.  History of Present Illness Patient is a 73 y.o. 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 He presented to his PCP on 05/17/23 with complaints of right sided jaw pain when coughing. CT neck revealed an ulcerated enhancing mass involving the superior oropharynx and soft palate/uvula in the greatest extent that is concerning for malignancy. PET on 06/07/23 revealed a right oropharyngeal tumor. Patient reported extreme pain when swallowing that prevented him from eating or swallowing on 06/08/23. Before beginning radiation, a  PEG/PAC was done on 2/28 and an MBS was done on 06/28/23. Patient began treatment on 07/12/23 which included 35 fractions of radiation to his right Tonsil and bilateral neck with weekly chemotherapy ending on 08/27/23. Patient has been seeing OP SLP. On 01/04/24, OP SLP recommended MBS due to throat clearing during session with water  and patient reported nasal regurgitation with milk. Patient brought in for OP MBS on 01/17/24.   Clinical Impression (P) Patient presents with a primary pharyngeal phase dysphagia and compared to MBS in March of this year, prior to starting radiation treatment, his swallow function has declined. He himself indicates that his primary nutrition has been 6 Ensure shakes per day but that most foods don't taste right and although he has a desire to eat, but after taking a couple bites, he typically loses all interest. Sensed aspiration occured with thin liquids (PAS 7) at the beginning of the study and silent aspiration occured at the  end of the study. Questionable aspiration with nectar thick liquids. Penetration to the vocal cords (PAS 5) that did not clear occured consistently with thin liquids. Pharyngeal impairments include, partial superior movement of the thyroid  cartilage, partial laryngeal elevation, incomplete laryngeal vestibule closure. Patient's epiglottis had bulbous appearance, a change from last MBS on 06/28/23. Chin tuck, left head turn, right head turn were all unsuccessful in preventing penetration/aspiration. Strategy of cough/throat clear and reswallow was the most effective strategy in helping patient clear penetration. Barium tablet became lodged at level of valleculae when swallowed with nectar thick liquid. Tablet fully transited when followed with puree. SLP recommending continue on current diet of regular/thin. Recommending to use strategy of performing a cough/throat clear, then reswallow after sips of liquids. In addition, SLP recommending patient refrain from large sips of liquids and/or consecutive sips of liquids. Encouraged patient to discuss further with OP SLP next scheduled visit.  DIGEST Swallow Severity Rating*  Safety: 3  Efficiency:1  Overall Pharyngeal Swallow Severity: 3 1: mild; 2: moderate; 3: severe; 4: profound  *The Dynamic Imaging Grade of Swallowing Toxicity is standardized for the head and neck cancer population, however, demonstrates promising clinical applications across populations to standardize the clinical rating of pharyngeal swallow safety and severity.   Swallow Evaluation Recommendations Recommendations: PO diet PO Diet Recommendation: Regular;Thin liquids (Level 0) Liquid Administration via: Cup;Straw Medication Administration: Whole meds with puree Supervision: Patient able to self-feed Swallowing strategies  : Slow rate;Small bites/sips;Hard cough after swallowing Postural changes: Position pt fully upright for meals;Stay upright 30-60 min after meals Oral care  recommendations: Oral care BID (2x/day)    Damien Hy  Graduate SLP Clinican

## 2024-01-18 ENCOUNTER — Ambulatory Visit: Payer: Self-pay

## 2024-01-18 DIAGNOSIS — C090 Malignant neoplasm of tonsillar fossa: Secondary | ICD-10-CM

## 2024-01-18 DIAGNOSIS — I890 Lymphedema, not elsewhere classified: Secondary | ICD-10-CM | POA: Diagnosis not present

## 2024-01-18 DIAGNOSIS — R293 Abnormal posture: Secondary | ICD-10-CM

## 2024-01-18 DIAGNOSIS — R059 Cough, unspecified: Secondary | ICD-10-CM | POA: Diagnosis not present

## 2024-01-18 NOTE — Therapy (Signed)
 OUTPATIENT PHYSICAL THERAPY HEAD AND NECK POST RADIATION TREATMENT   Patient Name: Isaac Carpenter MRN: [id number] DOB:09/25/1950, 73 y.o., male Today's Date: 01/18/2024  END OF SESSION:  PT End of Session - 01/18/24 1203     Visit Number 10    Number of Visits 11    Date for Recertification  01/27/24    Authorization Type [employer] Medicare    Authorization Time Period 01/07/2024-01/28/2024    Authorization - Visit Number 9    Authorization - Number of Visits 10    PT Start Time 1205    PT Stop Time 1253    PT Time Calculation (min) 48 min    Activity Tolerance Patient tolerated treatment well    Behavior During Therapy WFL for tasks assessed/performed           Past Medical History:  Diagnosis Date   Arthritis    Cancer (HCC)    History of partial thyroidectomy STATES OVERACTIVE THYROID -- NO ISSUES SINCE AGE 73 AND NO MEDS   History of radiation therapy    07/12/23-09/03/23 Right Tonsil  Dr. Lauraine Golden   Hyperlipidemia    Hypertension    Hypothyroidism    Sleep apnea    Per patient he tried CPAP but could not tolerate.   Thyroid  disease    Past Surgical History:  Procedure Laterality Date   CIRCUMCISION  01/04/2012   Procedure: CIRCUMCISION ADULT;  Surgeon: Arlena LILLETTE Gal, MD;  Location: [facility];  Service: Urology;  Laterality: N/A;   COLONOSCOPY  04/08/2020   Vito Cirigliano   CYSTOSCOPY  09/04/2022   Procedure: CYSTOSCOPY;  Surgeon: Matilda Senior, MD;  Location: [facility];  Service: Urology;;   IR GASTROSTOMY TUBE MOD SED  06/18/2023   IR IMAGING GUIDED PORT INSERTION  06/18/2023   RADIOACTIVE SEED IMPLANT N/A 09/04/2022   Procedure: RADIOACTIVE SEED IMPLANT/BRACHYTHERAPY IMPLANT;  Surgeon: Matilda Senior, MD;  Location: [facility];  Service: Urology;  Laterality: N/A;  90 MINS   SPACE OAR INSTILLATION N/A 09/04/2022   Procedure: SPACE OAR INSTILLATION;  Surgeon: Matilda Senior, MD;  Location: [facility];  Service: Urology;  Laterality: N/A;   THYROIDECTOMY, PARTIAL  AGE 73   OVERACTIVE THYROID    Patient Active Problem List   Diagnosis Date Noted   Cancer (HCC) 08/14/2023   Febrile neutropenia 08/14/2023   S/P percutaneous endoscopic gastrostomy (PEG) tube placement (HCC) 08/14/2023   Hyponatremia 08/14/2023   Normocytic anemia 08/14/2023   Chemotherapy induced neutropenia 07/30/2023   Fatigue 07/16/2023   Situational anxiety 07/09/2023   Encounter for antineoplastic chemotherapy 07/09/2023   Dysphagia 07/09/2023   Port-A-Cath in place 07/05/2023   Sensorineural hearing loss (SNHL) of right ear 06/23/2023   Cancer associated pain 06/11/2023   Coordination of complex care 06/11/2023   Malignant neoplasm of tonsillar fossa (HCC) 06/08/2023   Malignant neoplasm of prostate (HCC) 07/17/2022   Hyperlipidemia 05/11/2021   Morbid obesity (HCC)-BMI 37.8 with DM II,HLD,OSA,HTN 05/09/2021   Hypertension, essential, benign 07/23/2020   OSA (obstructive sleep apnea) 07/23/2020   Type 2 diabetes mellitus with other specified complication (HCC) 01/31/2020   BPH associated with nocturia 01/31/2020   Hypothyroidism, postsurgical 01/31/2020    PCP: Betty [name], MD   REFERRING PROVIDER: Lauraine Golden, MD   REFERRING DIAG: C09.0 (ICD-10-CM) - Malignant neoplasm of tonsillar fossa (HCC)   THERAPY DIAG:  Lymphedema, not elsewhere classified  Abnormal posture  Malignant neoplasm of tonsillar fossa (HCC)  Rationale for Evaluation  and Treatment: Rehabilitation  ONSET DATE: 05/27/23  SUBJECTIVE:                                                                                                                                                                                           SUBJECTIVE STATEMENT: I have a lot of thick mucous in my throat that I just can't get out. I see the MD tomorrow so I hope there is something he can do something about that. I took a swallowing  study yesterday. My wife has been doing most of the MLD and I do the anterior neck.  I wear the garment everyday some times wear it 7-8 hours. [name] said it looks smaller whenI take it off.  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 I have not been able to eat. I can not even get to the point of swallowing it. In the last week I have not had a problem with the phlegm at night but when I get up it sounds like my throat is full of phlegm and now I can not get it up.   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 He presented to his PCP on 05/17/23 with complaints of right sided jaw/maxillary pain when coughing. 05/17/23 He underwent a CT neck which revealed an ulcerated enhancing mass involving the superior oropharynx and soft palate/uvula measuring 3.4 x 3.0 x 3.6 cm in the greatest extent that is concerning for malignancy. No evidence of cervical lymphadenopathy was indicated on scan. 05/27/23 He saw Dr. Soldatova. She completed a laryngoscopy with biopsies which revealed Invasive moderately differentiated SCC, p 16 +. 06/04/23 visit with Dr. Soldatova for worsening symptoms of odynophagia and dysphagia. He was unable to maintain adequate nutrition. 06/07/23 PET revealing a right oropharyngeal tumor >4cm, likely b/l level II adenopathy, no distant metastases. He will receive 35 fractions of radiation to his right Tonsil and bilateral neck with weekly chemotherapy which started on 07/12/23 and will complete 08/27/23. PEG/PAC 2/28/25MBSS 06/28/23. When he presented for consult he had severe dysphagia and PEG was placed quickly. He did recover from the dysphagia (patient thinks it was related to biopsy pain).   PATIENT GOALS:  Reassess how my recovery is going related to neck ROM, cervical pain, fatigue, and swelling.  PAIN:  Are you having pain? No  PRECAUTIONS: Recent radiation, Head and neck lymphedema risk,    OBJECTIVE:   POSTURE:  Forward head and  rounded shoulders posture  30 SEC SIT TO STAND: 12/14/23: 10 reps in 30 sec without use of UEs which is below average  for patient's age At eval 07/22/23: 10 reps in 30 sec without use of UEs which is  Below average for patient's age.   SHOULDER AROM:   Limited bilaterally but functional      CERVICAL AROM:     Percent limited 07/22/23 eval 12/14/23 01/06/2024  Flexion WFL 25% limited 25 %  Extension 50% limited 50% limited 25% limit  Right lateral flexion 75% limited 75% limited 75% limited  Left lateral flexion 75% limited 75% limited 75% limited  Right rotation 50% limited 50% limited 50%  Left rotation 50% limited 50% limited 50%                          (Blank rows=not tested)  LYMPHEDEMA ASSESSMENT:    Circumference in cm 12/30/23 01/05/1014  4 cm superior to sternal notch around neck 47 47 47  6 cm superior to sternal notch around neck 48 48.4 47  8 cm superior to sternal notch around neck 48.5 49.5 48.2  R lateral nostril from base of nose to medial tragus     L lateral nostril from base of nose to medial tragus     R corner of mouth to where ear lobe meets face     L corner of mouth to where ear lobe meets face               (Blank rows=not tested)   OTHER SYMPTOMS: Pain No Fibrosis Yes Pitting edema Yes Infections No Decreased scar mobility No    TREATMENT TODAY 01/18/2024 Reviewed [place] anterior approach with pt in sitting. Pt performed all techniques with intermittent VC's and hand over hand cueing MLD to anterior neck in supine as follows: short neck, 5 diaphragmatic breaths, bilateral axillary nodes, bilateral pectoral nodes, anterior chest, short neck, posterior neck moving fluid towards pathway aimed at lateral neck, then lateral and anterior neck moving fluid towards pathway aimed at lateral neck then retracing all steps . Wrote on handout for pt to help him with steps. Decreased front of chest steps to 1 only due to size of his hand. Pt doing quite well with  technique/pressure but tends to slide Pts wife not able to be present today due to other appt but would like to review so will come with pt for last visit   01/06/2024 Measured pt for recert and assesed goals Reviewed [facility] anterior approach with pt in sitting. Pt performed all techniques with intermittent VC's and hand over hand cueing MLD to anterior neck in supine as follows: short neck, 5 diaphragmatic breaths, bilateral axillary nodes, bilateral pectoral nodes, anterior chest, short neck, posterior neck moving fluid towards pathway aimed at lateral neck, then lateral and anterior neck moving fluid towards pathway aimed at lateral neck then retracing all steps  Pts wife not able to be present today due to other appt but would like to review   01/04/2024 Discussed theraband postural exercises; pt is compliant and felt he is doing correctly. Can increase to 2 x 10 as tolerated Read instructions and explained to pt as I performed so that he may be able to help his wife with her technique at home. Continued MLD to anterior neck in supine as follows: short neck, 5 diaphragmatic breaths, bilateral axillary nodes, bilateral pectoral nodes, anterior chest, short neck, posterior neck moving fluid towards pathway aimed at lateral neck, then lateral and anterior neck moving fluid towards pathway aimed at lateral neck then retracing all steps  Advised pt  to check his neck prior to putting on his garment and when he takes it off so he can let us  know how it is doing. He can not tolerate the straps over his head, 12/30/23: Since pt is not able to tolerate his head and neck garment with strap on top of his head due to claustrophobia, therapist cut back edges of garment around face. Pt reported he thought is claustrophobia was caused by decreased vision from the garment. Therapist trimmed and re donned but pt felt panicked. Educated pt to try again at home but if he is unable to at least just use the strap behind  the neck. Circumferences remeasured - they have increased some Continued MLD to anterior neck in supine as follows: short neck, 5 diaphragmatic breaths, bilateral axillary nodes, bilateral pectoral nodes, anterior chest, short neck, posterior neck moving fluid towards pathway aimed at lateral neck, then lateral and anterior neck moving fluid towards pathway aimed at lateral neck then retracing all steps    12/28/2023 Discussed progress with pt. May try [employer]aires foam pack under garment to see if it helps Educated in postural theraband exs; Scapular retraction, shoulder extension, B. ER all with Green band. Updated HEP with pics and band Pt sitting in chair instructed in [place] anterior approach as follows: Caution with portacath, short neck, 5 diaphragmatic breaths, bilateral axillary nodes, bilateral pectoral nodes, anterior chest, short neck, posterior neck moving fluid towards pathway aimed at lateral neck, then lateral and anterior neck moving fluid towards pathway aimed at lateral neck then retracing all steps.  Had pt return demo each step of the cervical treatment while therapist provided v/c and t/c for correct direction of stretch, sequence and skin stretch technique. Therapist then placed pt on table and therapist performed all steps in reverse.  12/23/2023 Donned pts Tribute properly and within a minute or 2 he felt claustrophobic. Removed the head strap and he was able to tolerated the neck part snugged up pretty well. He will try it at home while awake using just the neck part, and we may be able to transition to the head part later.  [facility] anterior approach as follows: Therapist performed [facility] anterior approach handout as follows: short neck, 5 diaphragmatic breaths, bilateral axillary nodes, bilateral pectoral nodes, anterior chest, short neck, posterior neck moving fluid towards pathway aimed at lateral neck, then lateral and anterior neck moving fluid towards pathway aimed at lateral neck  then retracing all steps. Then had pt's spouse return demo each step of the cervical treatment while therapist provided v/c and t/c for correct direction of stretch, sequence and skin stretch technique. She watched therapist perform sequence in reverse.  12/22/23: Assessed fit of tribute head and neck garment. It was a little tight at the back of the neck so added an extender strap with Velcro and pt reports increased comfort with this.  Instructed pt's spouse in MLD technique using [facility] anterior approach as follows: Instructed pt using [facility] anterior approach handout as follows: short neck, 5 diaphragmatic breaths, bilateral axillary nodes, bilateral pectoral nodes, anterior chest, short neck, posterior neck moving fluid towards pathway aimed at lateral neck, then lateral and anterior neck moving fluid towards pathway aimed at lateral neck then retracing all steps. Had pt's spouse return demo each step while therapist provided v/c and t/c for correct direction of stretch, sequence and skin stretch technique. After educating pt in entire sequence therapist focused on lymphedema at anterior neck then retraced all steps.    12/16/2023  Assisted pts wife with ordering garment on [employer] since she couldn't get the site to come up on her phone; Gave print out of order Educated pt and his wife on lymphatics, and the gentle nature of technique and role of MLD in decreasing edema. Therapist performed [facility] anterior approach handout as follows with caution on right due to portacath: short neck, 5 diaphragmatic breaths, bilateral axillary nodes, bilateral pectoral nodes, anterior chest, short neck, posterior neck moving fluid towards pathway aimed at lateral neck, then lateral and anterior neck moving fluid towards pathway aimed at lateral neck then retracing all steps. Pts wife observed and then practiced MLD to left supraclavicular region only due to portacath, axillary and pectoral LN's, chest and briefly to  neck region. She was given a written handout.   PATIENT EDUCATION:  Education details: anatomy and physiology of the lymphatic system, how lymphedema is managed through self MLD and compression, head and neck tribute Radiation protection practitioner educated: Patient and Spouse Education method: Medical illustrator Education comprehension: verbalized understanding  HOME EXERCISE PROGRAM: Reviewed previously given post op HEP. Wear chip pack at least 4 to 5 hours per day  ASSESSMENT:  CLINICAL IMPRESSION: Reviewed MLD with pt in sitting;therapist demonstrated and then pt performed. He did quite well, and requires VC's and visual cues mainly to prevent the slide and get more stretch. He is now independent but we would like his wife to review 1 more time as she has not been able to be with him for last several appts. Gave acopy of handout with hints for performing written on it. EVAL Pt returns to PT after completing chemo and radiation for treatment of tonsillar cancer. He reports he has had a difficult time recovering. He is still unable to eat and is relying on his feeding tube. He has difficulty swallowing. He has lymphedema at his anterior throat with fibrosis present. His neck ROM is the same as baseline which is limited in all directions. His neck flexion was [facility] at baseline but now is limited possibly due to his lymphedema. He would benefit from skilled PT services to decrease anterior neck lymphedema to allow improved swallowing and decrease risk of infection and to improve cervical flexion ROM and posture.   Pt will benefit from skilled therapeutic intervention to improve on the following deficits: decreased knowledge of condition, decreased knowledge of use of DME, decreased ROM, increased edema, increased fascial restrictions, and postural dysfunction  PT treatment/interventions: ADL/Self care home management, [phone]- PT Re-evaluation, 97110-Therapeutic exercises, 97530- Therapeutic activity,  [id number]- Neuromuscular re-education, 97535- Self Care, 02859- Manual therapy, [id number]- Orthotic Initial, and [id number]- Orthotic/Prosthetic subsequent     GOALS: Goals reviewed with patient? Yes  LONG TERM GOALS:  (STG=LTG)  GOALS Name Target Date  Goal status  1 Pt will demonstrate a return to baseline cervical ROM measurements and not demonstrate any signs or symptoms of lymphedema. Baseline: 01/11/24 MET except flexion  2 Pt and/or spouse will be independent in self MLD for long term management of lymphedema. 01/11/24 01/27/2024   MET pt 01/18/2024  3 Pt will obtain a compression garment for long term management of lymphedema. 01/11/24 9/18/202  MET   4 Pt will demonstrate a 1 cm decrease in edema at 8 cm superior to sternal notch to decrease risk of infection. 01/11/24 01/27/2024   In PROGRESS       PLAN:  PT FREQUENCY/DURATION: 1x/week , (2 visits in 3 weeks)  PLAN FOR NEXT SESSION: Review MLD with pt/wife, MLD  [facility]  [address], [address]  [place] [address]  [phone]   Scar massage You can begin gentle scar massage to you incision sites. Gently place one hand on the incision and move the skin (without sliding on the skin) in various directions. Do this for a few minutes and then you can gently massage either coconut oil or vitamin E cream into the scars.  Home exercise Program Continue doing the exercises you were given until you feel like you can do them without feeling any tightness at the end. It is best to do them for several months after completion of radiation since the effects of radiation continue past completion.   Walking Program Studies show that 30 minutes of walking per day (fast enough to elevate your heart rate) can significantly reduce the risk of a cancer recurrence. If you can't walk due to other medical reasons, we encourage you to find another activity you could do (like a stationary bike or water   exercise).  Posture After treatment for head and neck cancer, people frequently sit with rounded shoulders and forward head posture because the front of the neck has become tight and it feels better. If you sit like this, you can become very tight and have pain in sitting or standing with good posture. Try to be aware of your posture and sit and stand up tall to heal properly.  Follow up PT: Please let you doctor know as soon as possible if you develop any swelling in your face or neck in the future. Lymphedema (swelling) can occur months after completion of radiation. The sooner you can let the doctor know, the sooner they can refer you back to PT. It is much easier to treat the swelling early on.   Grayce JINNY Sheldon, PT 01/18/2024, 12:54 PM

## 2024-01-19 ENCOUNTER — Inpatient Hospital Stay: Admitting: Oncology

## 2024-01-19 ENCOUNTER — Encounter (HOSPITAL_COMMUNITY): Payer: Self-pay | Admitting: Emergency Medicine

## 2024-01-19 ENCOUNTER — Observation Stay (HOSPITAL_BASED_OUTPATIENT_CLINIC_OR_DEPARTMENT_OTHER)
Admission: EM | Admit: 2024-01-19 | Discharge: 2024-01-19 | Disposition: A | Discharge status: Home / Self Care | Source: Home / Self Care | Attending: Emergency Medicine | Admitting: Emergency Medicine

## 2024-01-19 ENCOUNTER — Other Ambulatory Visit: Payer: Self-pay

## 2024-01-19 ENCOUNTER — Emergency Department (HOSPITAL_COMMUNITY)

## 2024-01-19 ENCOUNTER — Inpatient Hospital Stay

## 2024-01-19 DIAGNOSIS — R1010 Upper abdominal pain, unspecified: Secondary | ICD-10-CM | POA: Diagnosis not present

## 2024-01-19 DIAGNOSIS — I700 Atherosclerosis of aorta: Secondary | ICD-10-CM | POA: Diagnosis not present

## 2024-01-19 DIAGNOSIS — Z79899 Other long term (current) drug therapy: Secondary | ICD-10-CM | POA: Insufficient documentation

## 2024-01-19 DIAGNOSIS — R1110 Vomiting, unspecified: Secondary | ICD-10-CM | POA: Insufficient documentation

## 2024-01-19 DIAGNOSIS — R109 Unspecified abdominal pain: Secondary | ICD-10-CM | POA: Diagnosis present

## 2024-01-19 DIAGNOSIS — C090 Malignant neoplasm of tonsillar fossa: Secondary | ICD-10-CM

## 2024-01-19 DIAGNOSIS — I10 Essential (primary) hypertension: Secondary | ICD-10-CM | POA: Insufficient documentation

## 2024-01-19 DIAGNOSIS — R1012 Left upper quadrant pain: Secondary | ICD-10-CM

## 2024-01-19 DIAGNOSIS — R0902 Hypoxemia: Secondary | ICD-10-CM | POA: Diagnosis not present

## 2024-01-19 DIAGNOSIS — I517 Cardiomegaly: Secondary | ICD-10-CM | POA: Diagnosis not present

## 2024-01-19 DIAGNOSIS — M793 Panniculitis, unspecified: Secondary | ICD-10-CM | POA: Diagnosis not present

## 2024-01-19 DIAGNOSIS — D649 Anemia, unspecified: Secondary | ICD-10-CM

## 2024-01-19 DIAGNOSIS — K8012 Calculus of gallbladder with acute and chronic cholecystitis without obstruction: Secondary | ICD-10-CM | POA: Diagnosis not present

## 2024-01-19 DIAGNOSIS — K654 Sclerosing mesenteritis: Secondary | ICD-10-CM | POA: Diagnosis not present

## 2024-01-19 DIAGNOSIS — Z85818 Personal history of malignant neoplasm of other sites of lip, oral cavity, and pharynx: Secondary | ICD-10-CM | POA: Insufficient documentation

## 2024-01-19 DIAGNOSIS — K810 Acute cholecystitis: Secondary | ICD-10-CM | POA: Diagnosis not present

## 2024-01-19 DIAGNOSIS — Z7982 Long term (current) use of aspirin: Secondary | ICD-10-CM | POA: Insufficient documentation

## 2024-01-19 DIAGNOSIS — R10812 Left upper quadrant abdominal tenderness: Principal | ICD-10-CM | POA: Insufficient documentation

## 2024-01-19 DIAGNOSIS — E039 Hypothyroidism, unspecified: Secondary | ICD-10-CM | POA: Insufficient documentation

## 2024-01-19 DIAGNOSIS — D701 Agranulocytosis secondary to cancer chemotherapy: Secondary | ICD-10-CM

## 2024-01-19 DIAGNOSIS — C61 Malignant neoplasm of prostate: Secondary | ICD-10-CM

## 2024-01-19 LAB — CBC
HCT: 33.7 % — ABNORMAL LOW (ref 39.0–52.0)
HCT: 35.1 % — ABNORMAL LOW (ref 39.0–52.0)
Hemoglobin: 10.7 g/dL — ABNORMAL LOW (ref 13.0–17.0)
Hemoglobin: 10.9 g/dL — ABNORMAL LOW (ref 13.0–17.0)
MCH: 31.3 pg (ref 26.0–34.0)
MCH: 30.7 pg (ref 26.0–34.0)
MCHC: 31.8 g/dL (ref 30.0–36.0)
MCHC: 31.1 g/dL (ref 30.0–36.0)
MCV: 98.5 fL (ref 80.0–100.0)
MCV: 98.9 fL (ref 80.0–100.0)
Platelets: 154 K/uL (ref 150–400)
Platelets: 143 K/uL — ABNORMAL LOW (ref 150–400)
RBC: 3.42 MIL/uL — ABNORMAL LOW (ref 4.22–5.81)
RBC: 3.55 MIL/uL — ABNORMAL LOW (ref 4.22–5.81)
RDW: 13.9 % (ref 11.5–15.5)
RDW: 13.7 % (ref 11.5–15.5)
WBC: 5.3 K/uL (ref 4.0–10.5)
WBC: 5.8 K/uL (ref 4.0–10.5)
nRBC: 0 % (ref 0.0–0.2)
nRBC: 0 % (ref 0.0–0.2)

## 2024-01-19 LAB — COMPREHENSIVE METABOLIC PANEL WITH GFR
ALT: 15 U/L (ref 0–44)
AST: 26 U/L (ref 15–41)
Albumin: 3.8 g/dL (ref 3.5–5.0)
Alkaline Phosphatase: 76 U/L (ref 38–126)
Anion gap: 12 (ref 5–15)
BUN: 24 mg/dL — ABNORMAL HIGH (ref 8–23)
CO2: 26 mmol/L (ref 22–32)
Calcium: 9.4 mg/dL (ref 8.9–10.3)
Chloride: 102 mmol/L (ref 98–111)
Creatinine, Ser: 0.56 mg/dL — ABNORMAL LOW (ref 0.61–1.24)
GFR, Estimated: >60 mL/min (ref >60)
Glucose, Bld: 151 mg/dL — ABNORMAL HIGH (ref 70–99)
Potassium: 3.6 mmol/L (ref 3.5–5.1)
Sodium: 140 mmol/L (ref 135–145)
Total Bilirubin: 0.6 mg/dL (ref 0.0–1.2)
Total Protein: 6 g/dL — ABNORMAL LOW (ref 6.5–8.1)

## 2024-01-19 LAB — URINALYSIS, ROUTINE W REFLEX MICROSCOPIC
Bilirubin Urine: NEGATIVE
Glucose, UA: NEGATIVE mg/dL
Hgb urine dipstick: NEGATIVE
Ketones, ur: NEGATIVE mg/dL
Leukocytes,Ua: NEGATIVE
Nitrite: NEGATIVE
Protein, ur: NEGATIVE mg/dL
Specific Gravity, Urine: >1.046 — ABNORMAL HIGH (ref 1.005–1.030)
pH: 7 (ref 5.0–8.0)

## 2024-01-19 LAB — CREATININE, SERUM
Creatinine, Ser: 0.49 mg/dL — ABNORMAL LOW (ref 0.61–1.24)
GFR, Estimated: >60 mL/min (ref >60)

## 2024-01-19 LAB — I-STAT CHEM 8, ED
BUN: 22 mg/dL (ref 8–23)
Calcium, Ion: 1.16 mmol/L (ref 1.15–1.40)
Chloride: 101 mmol/L (ref 98–111)
Creatinine, Ser: 0.6 mg/dL — ABNORMAL LOW (ref 0.61–1.24)
Glucose, Bld: 155 mg/dL — ABNORMAL HIGH (ref 70–99)
HCT: 31 % — ABNORMAL LOW (ref 39.0–52.0)
Hemoglobin: 10.5 g/dL — ABNORMAL LOW (ref 13.0–17.0)
Potassium: 3.5 mmol/L (ref 3.5–5.1)
Sodium: 140 mmol/L (ref 135–145)
TCO2: 27 mmol/L (ref 22–32)

## 2024-01-19 LAB — I-STAT CG4 LACTIC ACID, ED: Lactic Acid, Venous: 1.3 mmol/L (ref 0.5–1.9)

## 2024-01-19 LAB — LIPASE, BLOOD: Lipase: 19 U/L (ref 11–51)

## 2024-01-19 LAB — GLUCOSE, CAPILLARY
Glucose-Capillary: 149 mg/dL — ABNORMAL HIGH (ref 70–99)
Glucose-Capillary: 144 mg/dL — ABNORMAL HIGH (ref 70–99)

## 2024-01-19 MED ORDER — INSULIN ASPART 100 UNIT/ML IJ SOLN
0.0000 [IU] | Freq: Every day | INTRAMUSCULAR | Status: DC
  Filled 2024-01-19: qty 0.05

## 2024-01-19 MED ORDER — INSULIN ASPART 100 UNIT/ML IJ SOLN
0.0000 [IU] | Freq: Three times a day (TID) | INTRAMUSCULAR | Status: DC
  Filled 2024-01-19: qty 0.09

## 2024-01-19 MED ORDER — ALPRAZOLAM 0.5 MG PO TABS
0.5000 mg | ORAL_TABLET | Freq: Two times a day (BID) | ORAL | Status: DC
Start: 2024-01-19 — End: 2024-01-19
  Administered 2024-01-19: 0.5 mg via ORAL
  Filled 2024-01-19: qty 1

## 2024-01-19 MED ORDER — IOHEXOL 300 MG/ML  SOLN
100.0000 mL | Freq: Once | INTRAMUSCULAR | Status: AC | PRN
  Administered 2024-01-19: 100 mL via INTRAVENOUS

## 2024-01-19 MED ORDER — LACTATED RINGERS IV SOLN: INTRAVENOUS | Status: DC

## 2024-01-19 MED ORDER — OXYCODONE HCL 5 MG PO TABS
5.0000 mg | ORAL_TABLET | Freq: Four times a day (QID) | ORAL | Status: DC | PRN
  Administered 2024-01-19: 5 mg via ORAL
  Filled 2024-01-19: qty 1

## 2024-01-19 MED ORDER — ENSURE PLUS HIGH PROTEIN PO LIQD
237.0000 mL | Freq: Two times a day (BID) | ORAL | Status: DC
  Administered 2024-01-19 (×2): 237 mL via ORAL

## 2024-01-19 MED ORDER — POLYETHYLENE GLYCOL 3350 17 G PO PACK: 17.0000 g | PACK | Freq: Every day | ORAL | Status: DC | PRN

## 2024-01-19 MED ORDER — ONDANSETRON HCL 4 MG/2ML IJ SOLN
4.0000 mg | Freq: Once | INTRAMUSCULAR | Status: AC
  Administered 2024-01-19: 4 mg via INTRAVENOUS
  Filled 2024-01-19: qty 2

## 2024-01-19 MED ORDER — MORPHINE SULFATE (PF) 2 MG/ML IV SOLN
2.0000 mg | INTRAVENOUS | Status: DC | PRN
  Filled 2024-01-19: qty 1

## 2024-01-19 MED ORDER — ACETAMINOPHEN 325 MG PO TABS: 650.0000 mg | ORAL_TABLET | Freq: Four times a day (QID) | ORAL | Status: DC | PRN

## 2024-01-19 MED ORDER — SODIUM CHLORIDE 0.9% FLUSH
10.0000 mL | Freq: Two times a day (BID) | INTRAVENOUS | Status: DC
  Administered 2024-01-19: 10 mL

## 2024-01-19 MED ORDER — HYDROMORPHONE HCL 1 MG/ML IJ SOLN
0.5000 mg | INTRAMUSCULAR | Status: DC | PRN
  Filled 2024-01-19: qty 1

## 2024-01-19 MED ORDER — IOHEXOL 300 MG/ML  SOLN
25.0000 mL | Freq: Once | INTRAMUSCULAR | Status: AC | PRN
  Administered 2024-01-19: 25 mL

## 2024-01-19 MED ORDER — DOXAZOSIN MESYLATE 1 MG PO TABS: 1.0000 mg | ORAL_TABLET | Freq: Every day | ORAL | Status: DC

## 2024-01-19 MED ORDER — ATORVASTATIN CALCIUM 10 MG PO TABS
20.0000 mg | ORAL_TABLET | Freq: Every day | ORAL | Status: DC
  Administered 2024-01-19: 20 mg via ORAL
  Filled 2024-01-19: qty 2

## 2024-01-19 MED ORDER — CHLORHEXIDINE GLUCONATE CLOTH 2 % EX PADS
6.0000 | MEDICATED_PAD | Freq: Every day | CUTANEOUS | Status: DC
  Administered 2024-01-19: 6 via TOPICAL

## 2024-01-19 MED ORDER — PROCHLORPERAZINE EDISYLATE 10 MG/2ML IJ SOLN: 5.0000 mg | Freq: Four times a day (QID) | INTRAMUSCULAR | Status: DC | PRN

## 2024-01-19 MED ORDER — HYDROMORPHONE HCL 1 MG/ML IJ SOLN
1.0000 mg | Freq: Once | INTRAMUSCULAR | Status: AC
  Administered 2024-01-19: 1 mg via INTRAVENOUS
  Filled 2024-01-19: qty 1

## 2024-01-19 MED ORDER — PANTOPRAZOLE SODIUM 40 MG PO TBEC
40.0000 mg | DELAYED_RELEASE_TABLET | Freq: Every day | ORAL | Status: DC
  Administered 2024-01-19: 40 mg via ORAL
  Filled 2024-01-19: qty 1

## 2024-01-19 MED ORDER — MELATONIN 5 MG PO TABS: 5.0000 mg | ORAL_TABLET | Freq: Every evening | ORAL | Status: DC | PRN

## 2024-01-19 MED ORDER — SODIUM CHLORIDE 0.9% FLUSH: 10.0000 mL | INTRAVENOUS | Status: DC | PRN

## 2024-01-19 MED ORDER — LEVOTHYROXINE SODIUM 88 MCG PO TABS
88.0000 ug | ORAL_TABLET | Freq: Every day | ORAL | Status: DC
  Administered 2024-01-19: 88 ug via ORAL
  Filled 2024-01-19 (×2): qty 1

## 2024-01-19 MED ORDER — SODIUM CHLORIDE 0.9 % IV BOLUS
500.0000 mL | Freq: Once | INTRAVENOUS | Status: AC
  Administered 2024-01-19: 500 mL via INTRAVENOUS

## 2024-01-19 MED ORDER — HEPARIN SOD (PORK) LOCK FLUSH 100 UNIT/ML IV SOLN
500.0000 [IU] | INTRAVENOUS | Status: AC | PRN
  Administered 2024-01-19: 500 [IU]

## 2024-01-19 MED ORDER — ENOXAPARIN SODIUM 60 MG/0.6ML IJ SOSY
60.0000 mg | PREFILLED_SYRINGE | INTRAMUSCULAR | Status: DC
Start: 2024-01-19 — End: 2024-01-19
  Administered 2024-01-19: 60 mg via SUBCUTANEOUS
  Filled 2024-01-19 (×2): qty 0.6

## 2024-01-19 NOTE — H&P (Addendum)
 History and Physical  Isaac Carpenter FMW:[id number] DOB: 12/11/1950 DOA: 01/19/2024  Referring physician: Dr. Griselda, EDP  PCP: [name], [name], MD  Outpatient Specialists: Oncology. Patient coming from: Home.  Chief Complaint: Abdominal pain and vomiting.  HPI: Isaac Carpenter is a 73 y.o. male with medical history significant for tonsillar cancer, status post chemoradiation, dysphagia and oral candidiasis following chemoradiation, status post PEG tube placement (initially placed in February 2025, replaced in May 2025, after accidentally pulling it out), radiation-induced lymphedema of the neck, BPH and urinary retention, hypothyroidism, anxiety disorder, GERD, who presents to the ER from home with complaints of generalized abdominal pain after drinking Ensure last night.  Associated with multiple episodes of vomiting this morning.  Denies chest pain or shortness of breath.  No subjective fevers.  Admits to chills.  His PEG tube has not been used since 12/27/2023.  Mostly drinks ensures by mouth with minimal solid food intake due to abnormal taste buds from radiation.  The PEG tube is flushed daily.  He noticed some bleeding around the PEG tube 2 days ago, the area has been tender since.    In the ER, appears uncomfortable due to the abdominal pain, tachypneic with elevated blood pressures.  CT abdomen pelvis with contrast revealed no acute abnormalities in the abdomen or pelvis.  Percutaneous gastrostomy tube appropriately positioned without leak.  Mesenteric panniculitis Misty mesentery .  Non-obstructing right nephrolithiasis.  The patient received IV opiate-based analgesics without significant improvement of his abdominal pain.  Admitted for further management.  ED Course: Temperature 98.6.  BP 164/89, pulse 90, respiration rate 23, O2 saturation 100% on room air.  Lab studies notable for hemoglobin 10.5.  Serum glucose 155.  BUN 22, creatinine 0.60.  Lactic acid 1.3.  Review of Systems: Review  of systems as noted in the HPI. All other systems reviewed and are negative.   Past Medical History:  Diagnosis Date   Arthritis    Cancer (HCC)    History of partial thyroidectomy STATES OVERACTIVE THYROID -- NO ISSUES SINCE AGE 36 AND NO MEDS   History of radiation therapy    07/12/23-09/03/23 Right Tonsil  Dr. Lauraine Golden   Hyperlipidemia    Hypertension    Hypothyroidism    Sleep apnea    Per patient he tried CPAP but could not tolerate.   Thyroid  disease    Past Surgical History:  Procedure Laterality Date   CIRCUMCISION  01/04/2012   Procedure: CIRCUMCISION ADULT;  Surgeon: Arlena LILLETTE Gal, MD;  Location: [facility];  Service: Urology;  Laterality: N/A;   COLONOSCOPY  04/08/2020   Vito Cirigliano   CYSTOSCOPY  09/04/2022   Procedure: CYSTOSCOPY;  Surgeon: Matilda Senior, MD;  Location: [facility];  Service: Urology;;   IR GASTROSTOMY TUBE MOD SED  06/18/2023   IR IMAGING GUIDED PORT INSERTION  06/18/2023   RADIOACTIVE SEED IMPLANT N/A 09/04/2022   Procedure: RADIOACTIVE SEED IMPLANT/BRACHYTHERAPY IMPLANT;  Surgeon: Matilda Senior, MD;  Location: [facility];  Service: Urology;  Laterality: N/A;  90 MINS   SPACE OAR INSTILLATION N/A 09/04/2022   Procedure: SPACE OAR INSTILLATION;  Surgeon: Matilda Senior, MD;  Location: [facility];  Service: Urology;  Laterality: N/A;   THYROIDECTOMY, PARTIAL  AGE 36   OVERACTIVE THYROID     Social History:  reports that he has never smoked. His smokeless tobacco use includes chew. He reports that he does not drink alcohol and does not use drugs.  Allergies  Allergen Reactions   Other Other (See Comments)    Tape used when putting in the port - caused welting   Testosterone Other (See Comments)    headache    Family History  Problem Relation Age of Onset   Cancer Mother    COPD Father    Heart attack Father    Cancer Sister    Cancer Brother    Cancer  Sister    Cancer Brother    Colon cancer Neg Hx    Colon polyps Neg Hx    Esophageal cancer Neg Hx    Rectal cancer Neg Hx    Stomach cancer Neg Hx       Prior to Admission medications   Medication Sig Start Date End Date Taking? Authorizing Provider  ALPRAZolam  (XANAX ) 1 MG tablet TAKE ONE-HALF TABLET BY MOUTH TWO TIMES DAILY AS NEEDED FOR ANXIETY Patient taking differently: Take 0.5 mg by mouth 2 (two) times daily. 11/16/23  Yes Burton, Lacie K, NP  aspirin 81 MG chewable tablet Chew 81 mg by mouth daily.   Yes [provider]  atorvastatin  (LIPITOR) 20 MG tablet Take 20 mg by mouth daily.   Yes [provider]  doxazosin  (CARDURA ) 1 MG tablet TAKE ONE TABLET BY MOUTH AT BEDTIME 12/01/23  Yes Pasam, Avinash, MD  levothyroxine  (SYNTHROID ) 88 MCG tablet TAKE ONE TABLET BY MOUTH DAILY. 07/12/23  Yes [name], [name], MD  pantoprazole  (PROTONIX ) 40 MG tablet Take 40 mg by mouth daily.   Yes [provider]  solifenacin (VESICARE) 5 MG tablet Take 5 mg by mouth daily. 10/12/22  Yes [provider]  amitriptyline  (ELAVIL ) 25 MG tablet Take 1 tablet (25 mg total) by mouth at bedtime. Patient not taking: Reported on 11/23/2023 10/08/23   Autumn Millman, MD  clotrimazole  (MYCELEX ) 10 MG troche Suck through 1 troche 5 times daily for 14 consecutive days Patient not taking: Reported on 11/23/2023 11/01/23   Izell Domino, MD  dexamethasone  (DEXAMETHASONE  INTENSOL) 1 MG/ML solution TAKE BY MOUTH OR G-TUBE ONCE DAILY FOR THREE DAYS AFTER CHEMO Patient not taking: Reported on 10/19/2023 08/11/23   Pasam, Millman, MD  esomeprazole  (NEXIUM ) 40 MG capsule Take 1 capsule (40 mg total) as directed daily at 12 noon. See instructions to administer through Tube. 12/03/23   Pasam, Millman, MD  fentaNYL  (DURAGESIC ) 50 MCG/HR Place 1 patch onto the skin every 3 (three) days. Patient not taking: Reported on 11/23/2023 10/01/23   Pasam, Millman, MD  fluconazole  (DIFLUCAN ) 40 MG/ML  suspension Take 5mL today, then 2.5mL daily for 20 more days. HOLD ATORVASTATIN  WHILE ON THIS. Patient not taking: Reported on 11/23/2023 10/13/23   Walisiewicz, Kaitlyn E, PA-C  hyoscyamine  (LEVSIN AMIEL) 0.125 MG SL tablet Place 2 tablets (0.25 mg total) under the tongue every 6 (six) hours as needed. 10/08/23   Pasam, Millman, MD  lidocaine  (XYLOCAINE ) 2 % solution Patient: Mix 1part 2% viscous lidocaine , 1part water . Swish and spit 10mL of diluted mixture every 2 hours as needed. 08/09/23   Izell Domino, MD  lidocaine -prilocaine  (EMLA ) cream Apply to affected area once 06/11/23   Pasam, Avinash, MD  magic mouthwash (nystatin , diphenhydrAMINE, alum & mag hydroxide) suspension mixture Take 5 mLs by mouth 3 (three) times daily as directed. 12/08/23   Pasam, Millman, MD  metoCLOPramide  (REGLAN ) 10 MG/10ML SOLN Place 10 mLs (10 mg total) into feeding tube 3 (three) times daily before meals. Patient not taking: Reported on 11/23/2023 09/08/23   Autumn Millman, MD  metoCLOPramide  (REGLAN ) 5 MG/5ML solution PLACE INTO FEEDING TUBE THREE TIMES DAILY BEFORE MEALS Patient not taking: Reported on 11/23/2023 09/08/23   Pasam, Chinita, MD  Nutritional Supplements (NUTREN 1.5) LIQD 2 cartons Nutren 1.5 (500 ml) QID via tube. Flush with 60 ml water  before/after each bolus. Provide additional 250 ml water  flush 4x/day in between feedings to meet hydration needs. Provides 3000 kcal, 136 g protein, 1528 ml free water  (3008 ml total water ) 2000 ml/day meets 100% DRI 07/09/23   Izell Domino, MD  ondansetron  (ZOFRAN ) 8 MG tablet TAKE ONE TABLET BY MOUTH EVERY 8 HOURS AS NEEDED FOR NAUSEA AND VOMITING. START ON THE THIRD DAY AFTER CISPLATIN  10/08/23   Pasam, Chinita, MD  oxyCODONE  (OXY IR/ROXICODONE ) 5 MG immediate release tablet Take 1 tablet (5 mg total) by mouth every 6 (six) hours as needed for severe pain (pain score 7-10). 11/19/23   Hanford Powell BRAVO, NP  prochlorperazine  (COMPAZINE ) 10 MG tablet Take 1 tablet (10 mg total) by  mouth every 6 (six) hours as needed (Nausea or vomiting). 06/11/23   Pasam, Chinita, MD  traZODone  (DESYREL ) 50 MG tablet Take 1 tablet (50 mg total) by mouth at bedtime. Patient not taking: Reported on 11/23/2023 09/03/23   Autumn Chinita, MD    Physical Exam: BP (!) 164/89   Pulse 90   Temp 98.6 F (37 C)   Resp (!) 21   Ht 6' 1 (1.854 m)   Wt 111.7 kg   SpO2 100%   BMI 32.49 kg/m   General: 73 y.o. year-old male well developed well nourished in no acute distress.  Alert and oriented x3. Cardiovascular: Regular rate and rhythm with no rubs or gallops.  No thyromegaly or JVD noted.  Trace lower extremity edema bilaterally.   Respiratory: Clear to auscultation with no wheezes or rales. Good inspiratory effort. Abdomen: Soft diffusely tender nondistended with normal bowel sounds x4 quadrants.  PEG tube in place, tenderness noted around PEG tube lumen. Muskuloskeletal: No cyanosis, clubbing or edema noted bilaterally Neuro: CN II-XII intact, strength, sensation, reflexes Skin: No ulcerative lesions noted or rashes Psychiatry: Judgement and insight appear normal. Mood is appropriate for condition and setting          Labs on Admission:  Basic Metabolic Panel: Recent Labs  Lab 01/19/24 0139 01/19/24 0205  NA 140 140  K 3.6 3.5  CL 102 101  CO2 26  --   GLUCOSE 151* 155*  BUN 24* 22  CREATININE 0.56* 0.60*  CALCIUM  9.4  --    Liver Function Tests: Recent Labs  Lab 01/19/24 0139  AST 26  ALT 15  ALKPHOS 76  BILITOT 0.6  PROT 6.0*  ALBUMIN 3.8   Recent Labs  Lab 01/19/24 0139  LIPASE 19   No results for input(s): AMMONIA in the last 168 hours. CBC: Recent Labs  Lab 01/19/24 0139 01/19/24 0205  WBC 5.3  --   HGB 10.7* 10.5*  HCT 33.7* 31.0*  MCV 98.5  --   PLT 154  --    Cardiac Enzymes: No results for input(s): CKTOTAL, CKMB, CKMBINDEX, TROPONINI in the last 168 hours.  BNP (last 3 results) No results for input(s): BNP in the last 8760  hours.  ProBNP (last 3 results) No results for input(s): PROBNP in the last 8760 hours.  CBG: No results for input(s): GLUCAP in the last 168 hours.  Radiological Exams on Admission: CT ABDOMEN PELVIS W CONTRAST Result Date: 01/19/2024 EXAM: CT ABDOMEN AND PELVIS WITH  CONTRAST 01/19/2024 03:16:59 AM TECHNIQUE: CT of the abdomen and pelvis was performed with the administration of 100 ml of intravenous iohexol  (OMNIPAQUE ) 300 MG/ML solution and 25 ml of iohexol  (OMNIPAQUE ) 300 MG/ML solution via G-tube. Multiplanar reformatted images are provided for review. Automated exposure control, iterative reconstruction, and/or weight-based adjustment of the mA/kV was utilized to reduce the radiation dose to as low as reasonably achievable. COMPARISON: 09/24/2022 and PET/CT 11/16/2023. CLINICAL HISTORY: Acute, nonlocalized abdominal pain, LUQ abdominal pain, and emesis starting this AM. Patient is visibly uncomfortable. History of tonsillar cancer with G-tube placement (not used since 9/8, flushed daily with bleeding from site this AM, followed by vomiting). Prior chemo and radiation treatments. WBCs 5.3, GFR > 60. Swallow function test 01/17/24. FINDINGS: LOWER CHEST: No acute abnormality. LIVER: The liver is unremarkable. GALLBLADDER AND BILE DUCTS: Gallbladder is unremarkable. No biliary ductal dilatation. SPLEEN: No acute abnormality. PANCREAS: No acute abnormality. ADRENAL GLANDS: No acute abnormality. KIDNEYS, URETERS AND BLADDER: Nonobstructing right nephrolithiasis. No stones in the left kidney or ureters. No hydronephrosis. No perinephric or periureteral stranding. Urinary bladder is unremarkable. GI AND BOWEL: Percutaneous gastrostomy tube in expected position. Contrast within the stomach. No extraluminal contrast. Additional dense contrast in the right colon and rectum. There is no bowel obstruction. PERITONEUM AND RETROPERITONEUM: No ascites. No free air. VASCULATURE: Aorta is calcified. LYMPH NODES:  Misty mesenteric fat with prominent subcentimeter lymph nodes and perinodal fat sparing compatible with mesenteric panniculitis. REPRODUCTIVE ORGANS: Metallic seeds in the prostate. BONES AND SOFT TISSUES: No acute osseous abnormality. No focal soft tissue abnormality. IMPRESSION: 1. No acute abnormalities in the abdomen or pelvis. 2. Percutaneous gastrostomy tube appropriately positioned without leak. 3. Mesenteric panniculitis. 4. Nonobstructing right nephrolithiasis. Electronically signed by: Norman Gatlin MD 01/19/2024 03:37 AM EDT RP Workstation: [id number]   DG Chest Port 1 View Result Date: 01/19/2024 CLINICAL DATA:  Upper abdominal pain and hypoxia. EXAM: PORTABLE CHEST 1 VIEW COMPARISON:  Portable chest 08/14/2023 FINDINGS: Right IJ port catheter terminates at the superior cavoatrial junction, as before. There is mild cardiomegaly. No evidence of CHF. The mediastinum is normally outlined. There is calcification of the transverse aorta. Visualized lungs clear with both CP sulci excluded from the study. Thoracic spondylosis. No new osseous findings. Thoracic cage is intact. Overlying telemetry leads. IMPRESSION: 1. No evidence of acute chest disease. 2. Mild cardiomegaly. 3. Aortic atherosclerosis. Electronically Signed   By: Francis Quam M.D.   On: 01/19/2024 02:29   DG SWALLOW FUNC OP MEDICARE SPEECH PATH Result Date: 01/17/2024 Table formatting from the original result was not included. Modified Barium Swallow Study Patient Details Name: RUSSEL MORAIN MRN: [id number] Date of Birth: 1950-09-10 Today's Date: 01/17/2024 HPI/PMH: HPI: Patient is a 73 y.o. 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 He presented to his PCP on 05/17/23 with complaints of right sided jaw pain when coughing. CT neck revealed an ulcerated enhancing mass involving the superior oropharynx and soft palate/uvula in the greatest extent that is concerning for malignancy.  PET on 06/07/23 revealed a right oropharyngeal tumor. Patient reported extreme pain when swallowing that prevented him from eating or swallowing on 06/08/23. Before beginning radiation, a  PEG/PAC was done on 2/28 and an MBS was done on 06/28/23. Patient began treatment on 07/12/23 which included 35 fractions of radiation to his right Tonsil and bilateral neck with weekly chemotherapy ending on 08/27/23. Patient has been seeing OP SLP. On 01/04/24, OP SLP recommended MBS due to  throat clearing during session with water  and patient reported nasal regurgitation with milk. Patient brought in for OP MBS on 01/17/24. Clinical Impression: Clinical Impression: Patient presents with a primary pharyngeal phase dysphagia and compared to MBS in March of this year, prior to starting radiation treatment, his swallow function has declined. He himself indicates that his primary nutrition has been 6 Ensure shakes per day but that most foods don't taste right and although he has a desire to eat, but after taking a couple bites, he typically loses all interest. Sensed aspiration occured with thin liquids (PAS 7) at the beginning of the study and silent aspiration occured at the end of the study. Questionable aspiration with nectar thick liquids. Penetration to the vocal cords (PAS 5) that did not clear occured consistently with thin liquids. Pharyngeal impairments include, partial superior movement of the thyroid  cartilage, partial laryngeal elevation, incomplete laryngeal vestibule closure. Patient's epiglottis had bulbous appearance, a change from last MBS on 06/28/23. Chin tuck, left head turn, right head turn were all unsuccessful in preventing penetration/aspiration. Strategy of cough/throat clear and reswallow was the most effective strategy in helping patient clear penetration. Barium tablet became lodged at level of valleculae when swallowed with nectar thick liquid. Tablet fully transited when followed with puree. SLP recommending  continue on current diet of regular/thin. Recommending to use strategy of performing a cough/throat clear, then reswallow after sips of liquids. In addition, SLP recommending patient refrain from large sips of liquids and/or consecutive sips of liquids. Encouraged patient to discuss further with OP SLP next scheduled visit. Recommendations/Plan: Swallowing Evaluation Recommendations Swallowing Evaluation Recommendations Recommendations: PO diet PO Diet Recommendation: Regular; Thin liquids (Level 0) Liquid Administration via: Cup; Straw Medication Administration: Whole meds with puree Supervision: Patient able to self-feed Swallowing strategies  : Slow rate; Small bites/sips; Hard cough after swallowing Postural changes: Position pt fully upright for meals; Stay upright 30-60 min after meals Oral care recommendations: Oral care BID (2x/day) Treatment Plan Treatment Plan Treatment recommendations: Defer treatment plan to SLP at other venue (see follow-up recommendations) Follow-up recommendations: Outpatient SLP Functional status assessment: Patient has had a recent decline in their functional status and demonstrates the ability to make significant improvements in function in a reasonable and predictable amount of time. Interventions: Patient/family education; Oropharyngeal exercises Recommendations Recommendations for follow up therapy are one component of a multi-disciplinary discharge planning process, led by the attending physician.  Recommendations may be updated based on patient status, additional functional criteria and insurance authorization. Assessment: Orofacial Exam: Orofacial Exam Oral Cavity: Oral Hygiene: WFL Oral Cavity - Dentition: Adequate natural dentition Orofacial Anatomy: WFL Oral Motor/Sensory Function: WFL Anatomy: Anatomy: WFL Boluses Administered: Boluses Administered Boluses Administered: Thin liquids (Level 0); Mildly thick liquids (Level 2, nectar thick); Moderately thick liquids (Level  3, honey thick); Puree; Solid  Oral Impairment Domain: Oral Impairment Domain Lip Closure: No labial escape Tongue control during bolus hold: Cohesive bolus between tongue to palatal seal Bolus preparation/mastication: Timely and efficient chewing and mashing Bolus transport/lingual motion: Brisk tongue motion Oral residue: Trace residue lining oral structures Location of oral residue : Tongue Initiation of pharyngeal swallow : Posterior angle of the ramus  Pharyngeal Impairment Domain: Pharyngeal Impairment Domain Soft palate elevation: Trace column of contrast or air between SP and PW Laryngeal elevation: Partial superior movement of thyroid  cartilage/partial approximation of arytenoids to epiglottic petiole Anterior hyoid excursion: Complete anterior movement Epiglottic movement: Complete inversion Laryngeal vestibule closure: Incomplete, narrow column air/contrast in laryngeal vestibule Pharyngeal stripping wave :  Present - complete Pharyngeal contraction (A/P view only): N/A Pharyngoesophageal segment opening: Complete distension and complete duration, no obstruction of flow Tongue base retraction: Trace column of contrast or air between tongue base and PPW Pharyngeal residue: Collection of residue within or on pharyngeal structures Location of pharyngeal residue: Valleculae  Esophageal Impairment Domain: Esophageal Impairment Domain Esophageal clearance upright position: Complete clearance, esophageal coating Pill: Pill Consistency administered: Puree; Mildly thick liquids (Level 2, nectar thick) Mildly thick liquids (Level 2, nectar thick): Impaired (see clinical impressions) Puree: WFL Penetration/Aspiration Scale Score: Penetration/Aspiration Scale Score 1.  Material does not enter airway: Puree; Solid; Moderately thick liquids (Level 3, honey thick) 8.  Material enters airway, passes BELOW cords without attempt by patient to eject out (silent aspiration) : Thin liquids (Level 0); Mildly thick liquids  (Level 2, nectar thick) Compensatory Strategies: Compensatory Strategies Compensatory strategies: Yes Chin tuck: Ineffective Ineffective Chin Tuck: Thin liquid (Level 0) Left head turn: Ineffective Ineffective Left Head Turn: Thin liquid (Level 0) Right head turn: Ineffective Ineffective Right Head Turn: Thin liquid (Level 0) Other(comment): Effective (Swallow thin liquid. Cough/clear throat. Swallow again.) Effective Other(comment): Thin liquid (Level 0)   General Information: Caregiver present: Yes  Diet Prior to this Study: Regular; Thin liquids (Level 0)   Temperature : Normal   Respiratory Status: WFL   Supplemental O2: None (Room air)   History of Recent Intubation: No  Behavior/Cognition: Alert; Cooperative; Pleasant mood Self-Feeding Abilities: Able to self-feed Baseline vocal quality/speech: Normal Volitional Cough: Able to elicit Volitional Swallow: Able to elicit Exam Limitations: No limitations Goal Planning: Prognosis for improved oropharyngeal function: Fair Barriers to Reach Goals: Overall medical prognosis Barriers/Prognosis Comment: Impact of radiation/chemotherapy Patient/Family Stated Goal: For patient to increase PO intake. Consulted and agree with results and recommendations: Patient; Family member/caregiver Pain: Pain Assessment Pain Assessment: No/denies pain End of Session: Start Time:SLP Start Time (ACUTE ONLY): 1316 Stop Time: SLP Stop Time (ACUTE ONLY): 1355 Time Calculation:SLP Time Calculation (min) (ACUTE ONLY): 39 min Charges: SLP Evaluations $ SLP Speech Visit: 1 Visit SLP Evaluations $Outpatient MBS Swallow: 1 Procedure SLP visit diagnosis: SLP Visit Diagnosis: Dysphagia, pharyngeal phase (R13.13) Past Medical History: Past Medical History: Diagnosis Date  Arthritis   Cancer (HCC)   History of partial thyroidectomy STATES OVERACTIVE THYROID -- NO ISSUES SINCE AGE 73 AND NO MEDS  History of radiation therapy   07/12/23-09/03/23 Right Tonsil  Dr. Lauraine Golden  Hyperlipidemia    Hypertension   Hypothyroidism   Sleep apnea   Per patient he tried CPAP but could not tolerate.  Thyroid  disease  Past Surgical History: Past Surgical History: Procedure Laterality Date  CIRCUMCISION  01/04/2012  Procedure: CIRCUMCISION ADULT;  Surgeon: Arlena LILLETTE Gal, MD;  Location: [facility];  Service: Urology;  Laterality: N/A;  COLONOSCOPY  04/08/2020  Vito Cirigliano  CYSTOSCOPY  09/04/2022  Procedure: CYSTOSCOPY;  Surgeon: Matilda Senior, MD;  Location: [facility];  Service: Urology;;  IR GASTROSTOMY TUBE MOD SED  06/18/2023  IR IMAGING GUIDED PORT INSERTION  06/18/2023  RADIOACTIVE SEED IMPLANT N/A 09/04/2022  Procedure: RADIOACTIVE SEED IMPLANT/BRACHYTHERAPY IMPLANT;  Surgeon: Matilda Senior, MD;  Location: [facility];  Service: Urology;  Laterality: N/A;  90 MINS  SPACE OAR INSTILLATION N/A 09/04/2022  Procedure: SPACE OAR INSTILLATION;  Surgeon: Matilda Senior, MD;  Location: [facility];  Service: Urology;  Laterality: N/A;  THYROIDECTOMY, PARTIAL  AGE 73  OVERACTIVE THYROID  Norleen IVAR Blase, MA, CCC-SLP Speech Therapy CLINICAL DATA:  Provided  history: Cough, unspecified type. Dysphagia, unspecified type. EXAM: MODIFIED BARIUM SWALLOW TECHNIQUE: Different consistencies of barium were administered orally to the patient by the speech pathologist with fluoroscopic imaging of the pharynx, as well as limited imaging of the esophagus, acquired in the lateral projection. The radiologist was present in the fluoroscopy room for this study, providing personal supervision. Cine fluoroscopy/video radiography was acquired for the study. FLUOROSCOPY: Radiation Exposure Index (as provided by the fluoroscopic device): 35.50 mGy Kerma COMPARISON:  Modified barium swallow 06/28/2023. FINDINGS: Different consistencies of barium were administered orally to the patient by the speech pathologist with fluoroscopic imaging of the pharynx, as well as  limited imaging of the esophagus, acquired in the lateral projection. The radiologist was present in the fluoroscopy room for this study, providing personal supervision. The speech pathologist observed aspiration with thin and nectar consistencies. Please refer to the speech pathologist's report for complete details. Fairly bulky ventral osteophytes at C5-C6 and C6-C7 (which do not significantly efface the pharynx or cervical esophagus). IMPRESSION: Modified barium swallow as described. Aspiration was observed with thin and nectar consistencies. Please refer to the speech pathologists report for complete details and recommendations. Electronically Signed   By: Rockey Childs D.O.   On: 01/17/2024 14:32   EKG: I independently viewed the EKG done and my findings are as followed: Sinus rhythm rate of 85.  Nonspecific ST-T changes.  QTc 500.  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 Follow urine analysis. History of morphine  induced hallucinations, morphine  added as allergy medication. Oncology consulted to assist with the management  History of tonsillar cancer status post chemoradiation Follows with oncology outpatient, Dr. Autumn.  Type 2 diabetes with hyperglycemia Last hemoglobin A1c 6.5 on 05/17/2023 Serum glucose 155 The patient has minimal oral intake.  Will hold off on insulin  coverage for now to avoid hypoglycemia.  Anemia of chronic disease Hemoglobin 10.5 Appears to be at baseline. Continue to monitor  Nonobstructing right nephrolithiasis Continue supportive care. IV fluid hydration As needed analgesics  Anxiety disorder Resume home regimen.  Hypothyroidism Resume home levothyroxine .  GERD Resume home PPI.  BPH Resume home regimen Monitor for  urinary retention and urine output   Time: 75 minutes.   DVT prophylaxis: Subcu Lovenox daily.  Code Status: Full code.  Family Communication: Updated the patient's spouse at bedside.  Disposition Plan: Admitted to MedSurg unit.  Consults called: Medical oncology, Dr. Lanny.  Admission status: Observation status.   Status is: Observation    Terry LOISE Hurst MD [facility] Pager [phone]  If 7PM-7AM, please contact night-coverage [email or web address] Password [email or web address]  01/19/2024, 5:22 AM

## 2024-01-19 NOTE — ED Triage Notes (Signed)
 Pt presents to the ED via POV with complaints of Emesis that started this AM. Pt visibly uncomfortable. Gtube was placed due to tonsillar cancer but has not been used since 9/8. Per support person, the patient has had the tube flushed daily but had some bleeding from the site this AM followed by several episodes of vomiting. A&Ox4 at this time. Denies CP or SOB.

## 2024-01-19 NOTE — Discharge Summary (Signed)
 Physician Discharge Summary  Isaac Carpenter FMW:[id number] DOB: 02-Jul-1950 DOA: 01/19/2024  PCP: [name], [name], MD  Admit date: 01/19/2024 Discharge date: 01/19/2024  Admitted From: Home Disposition: Home  Recommendations for Outpatient Follow-up:  Follow up with PCP in 1-2 weeks  Home Health: None Equipment/Devices: None  Discharge Condition: Stable CODE STATUS: Full Diet recommendation: Low-salt low-fat diet  Brief/Interim Summary: Isaac Carpenter is a 73 y.o. male with medical history significant for tonsillar cancer, status post chemoradiation, dysphagia and oral candidiasis following chemoradiation, status post PEG tube placement (initially placed in February 2025, replaced in May 2025, after accidentally pulling it out), radiation-induced lymphedema of the neck, BPH and urinary retention, hypothyroidism, anxiety disorder, GERD, who presents to the ER from home with complaints of generalized abdominal pain.  Patient presents after consuming an increased amount of insulin  per wife at home with worsening abdominal pain.  He does have chronic PEG tube but has not been used since September 8th of this year but appears to be unrelated to patient's complaints.  Imaging at intake was unremarkable for any acute findings, questionable mesenteric panniculitis was noted but without any other clear etiology.  Nonobstructive right nephrolithiasis also noted but patient indicates his pain is a left upper quadrant and likely unrelated.  At this time given patient's improvement and resolution of symptoms with supportive care he is otherwise stable and agreeable for discharge home.  We discussed with patient and his wife to decrease his mealtime volume as his pain appears to be related to his increased p.o. intake over the past 72 hours.  Discharge Diagnoses:  Principal Problem:   Abdominal pain    Discharge Instructions  Discharge Instructions     Call MD for:  difficulty breathing, headache or  visual disturbances   Complete by: As directed    Call MD for:  extreme fatigue   Complete by: As directed    Call MD for:  hives   Complete by: As directed    Call MD for:  persistant dizziness or light-headedness   Complete by: As directed    Call MD for:  persistant nausea and vomiting   Complete by: As directed    Call MD for:  severe uncontrolled pain   Complete by: As directed    Call MD for:  temperature >100.4   Complete by: As directed    Diet - low sodium heart healthy   Complete by: As directed    Increase activity slowly   Complete by: As directed       Allergies as of 01/19/2024       Reactions   Morphine     Hallucinations   Other Other (See Comments)   Tape used when putting in the port - caused welting   Testosterone Other (See Comments)   headache        Medication List     TAKE these medications    ALPRAZolam  1 MG tablet Commonly known as: XANAX  TAKE ONE-HALF TABLET BY MOUTH TWO TIMES DAILY AS NEEDED FOR ANXIETY What changed: See the new instructions.   aspirin 81 MG chewable tablet Chew 81 mg by mouth daily.   atorvastatin  20 MG tablet Commonly known as: LIPITOR Take 20 mg by mouth daily.   doxazosin  1 MG tablet Commonly known as: CARDURA  TAKE ONE TABLET BY MOUTH AT BEDTIME   levothyroxine  88 MCG tablet Commonly known as: SYNTHROID  TAKE ONE TABLET BY MOUTH DAILY.   pantoprazole  40 MG tablet Commonly known as: PROTONIX  Take  40 mg by mouth daily.   solifenacin 5 MG tablet Commonly known as: VESICARE Take 5 mg by mouth daily.        Allergies  Allergen Reactions   Morphine      Hallucinations   Other Other (See Comments)    Tape used when putting in the port - caused welting   Testosterone Other (See Comments)    headache    Consultations: None  Procedures/Studies: CT ABDOMEN PELVIS W CONTRAST Result Date: 01/19/2024 EXAM: CT ABDOMEN AND PELVIS WITH CONTRAST 01/19/2024 03:16:59 AM TECHNIQUE: CT of the abdomen and  pelvis was performed with the administration of 100 ml of intravenous iohexol  (OMNIPAQUE ) 300 MG/ML solution and 25 ml of iohexol  (OMNIPAQUE ) 300 MG/ML solution via G-tube. Multiplanar reformatted images are provided for review. Automated exposure control, iterative reconstruction, and/or weight-based adjustment of the mA/kV was utilized to reduce the radiation dose to as low as reasonably achievable. COMPARISON: 09/24/2022 and PET/CT 11/16/2023. CLINICAL HISTORY: Acute, nonlocalized abdominal pain, LUQ abdominal pain, and emesis starting this AM. Patient is visibly uncomfortable. History of tonsillar cancer with G-tube placement (not used since 9/8, flushed daily with bleeding from site this AM, followed by vomiting). Prior chemo and radiation treatments. WBCs 5.3, GFR > 60. Swallow function test 01/17/24. FINDINGS: LOWER CHEST: No acute abnormality. LIVER: The liver is unremarkable. GALLBLADDER AND BILE DUCTS: Gallbladder is unremarkable. No biliary ductal dilatation. SPLEEN: No acute abnormality. PANCREAS: No acute abnormality. ADRENAL GLANDS: No acute abnormality. KIDNEYS, URETERS AND BLADDER: Nonobstructing right nephrolithiasis. No stones in the left kidney or ureters. No hydronephrosis. No perinephric or periureteral stranding. Urinary bladder is unremarkable. GI AND BOWEL: Percutaneous gastrostomy tube in expected position. Contrast within the stomach. No extraluminal contrast. Additional dense contrast in the right colon and rectum. There is no bowel obstruction. PERITONEUM AND RETROPERITONEUM: No ascites. No free air. VASCULATURE: Aorta is calcified. LYMPH NODES: Misty mesenteric fat with prominent subcentimeter lymph nodes and perinodal fat sparing compatible with mesenteric panniculitis. REPRODUCTIVE ORGANS: Metallic seeds in the prostate. BONES AND SOFT TISSUES: No acute osseous abnormality. No focal soft tissue abnormality. IMPRESSION: 1. No acute abnormalities in the abdomen or pelvis. 2. Percutaneous  gastrostomy tube appropriately positioned without leak. 3. Mesenteric panniculitis. 4. Nonobstructing right nephrolithiasis. Electronically signed by: Norman Gatlin MD 01/19/2024 03:37 AM EDT RP Workstation: [id number]   DG Chest Port 1 View Result Date: 01/19/2024 CLINICAL DATA:  Upper abdominal pain and hypoxia. EXAM: PORTABLE CHEST 1 VIEW COMPARISON:  Portable chest 08/14/2023 FINDINGS: Right IJ port catheter terminates at the superior cavoatrial junction, as before. There is mild cardiomegaly. No evidence of CHF. The mediastinum is normally outlined. There is calcification of the transverse aorta. Visualized lungs clear with both CP sulci excluded from the study. Thoracic spondylosis. No new osseous findings. Thoracic cage is intact. Overlying telemetry leads. IMPRESSION: 1. No evidence of acute chest disease. 2. Mild cardiomegaly. 3. Aortic atherosclerosis. Electronically Signed   By: Francis Quam M.D.   On: 01/19/2024 02:29   DG SWALLOW FUNC OP MEDICARE SPEECH PATH Result Date: 01/17/2024 Table formatting from the original result was not included. Modified Barium Swallow Study Patient Details Name: BERTIL BRICKEY MRN: [id number] Date of Birth: 1950-07-30 Today's Date: 01/17/2024 HPI/PMH: HPI: Patient is a 73 y.o. 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 He presented to his PCP on 05/17/23 with complaints of right sided jaw pain when coughing. CT neck revealed an ulcerated enhancing mass involving the superior oropharynx  and soft palate/uvula in the greatest extent that is concerning for malignancy. PET on 06/07/23 revealed a right oropharyngeal tumor. Patient reported extreme pain when swallowing that prevented him from eating or swallowing on 06/08/23. Before beginning radiation, a  PEG/PAC was done on 2/28 and an MBS was done on 06/28/23. Patient began treatment on 07/12/23 which included 35 fractions of radiation to his right Tonsil and  bilateral neck with weekly chemotherapy ending on 08/27/23. Patient has been seeing OP SLP. On 01/04/24, OP SLP recommended MBS due to throat clearing during session with water  and patient reported nasal regurgitation with milk. Patient brought in for OP MBS on 01/17/24. Clinical Impression: Clinical Impression: Patient presents with a primary pharyngeal phase dysphagia and compared to MBS in March of this year, prior to starting radiation treatment, his swallow function has declined. He himself indicates that his primary nutrition has been 6 Ensure shakes per day but that most foods don't taste right and although he has a desire to eat, but after taking a couple bites, he typically loses all interest. Sensed aspiration occured with thin liquids (PAS 7) at the beginning of the study and silent aspiration occured at the end of the study. Questionable aspiration with nectar thick liquids. Penetration to the vocal cords (PAS 5) that did not clear occured consistently with thin liquids. Pharyngeal impairments include, partial superior movement of the thyroid  cartilage, partial laryngeal elevation, incomplete laryngeal vestibule closure. Patient's epiglottis had bulbous appearance, a change from last MBS on 06/28/23. Chin tuck, left head turn, right head turn were all unsuccessful in preventing penetration/aspiration. Strategy of cough/throat clear and reswallow was the most effective strategy in helping patient clear penetration. Barium tablet became lodged at level of valleculae when swallowed with nectar thick liquid. Tablet fully transited when followed with puree. SLP recommending continue on current diet of regular/thin. Recommending to use strategy of performing a cough/throat clear, then reswallow after sips of liquids. In addition, SLP recommending patient refrain from large sips of liquids and/or consecutive sips of liquids. Encouraged patient to discuss further with OP SLP next scheduled visit.  Recommendations/Plan: Swallowing Evaluation Recommendations Swallowing Evaluation Recommendations Recommendations: PO diet PO Diet Recommendation: Regular; Thin liquids (Level 0) Liquid Administration via: Cup; Straw Medication Administration: Whole meds with puree Supervision: Patient able to self-feed Swallowing strategies  : Slow rate; Small bites/sips; Hard cough after swallowing Postural changes: Position pt fully upright for meals; Stay upright 30-60 min after meals Oral care recommendations: Oral care BID (2x/day) Treatment Plan Treatment Plan Treatment recommendations: Defer treatment plan to SLP at other venue (see follow-up recommendations) Follow-up recommendations: Outpatient SLP Functional status assessment: Patient has had a recent decline in their functional status and demonstrates the ability to make significant improvements in function in a reasonable and predictable amount of time. Interventions: Patient/family education; Oropharyngeal exercises Recommendations Recommendations for follow up therapy are one component of a multi-disciplinary discharge planning process, led by the attending physician.  Recommendations may be updated based on patient status, additional functional criteria and insurance authorization. Assessment: Orofacial Exam: Orofacial Exam Oral Cavity: Oral Hygiene: WFL Oral Cavity - Dentition: Adequate natural dentition Orofacial Anatomy: WFL Oral Motor/Sensory Function: WFL Anatomy: Anatomy: WFL Boluses Administered: Boluses Administered Boluses Administered: Thin liquids (Level 0); Mildly thick liquids (Level 2, nectar thick); Moderately thick liquids (Level 3, honey thick); Puree; Solid  Oral Impairment Domain: Oral Impairment Domain Lip Closure: No labial escape Tongue control during bolus hold: Cohesive bolus between tongue to palatal seal Bolus preparation/mastication: Timely and  efficient chewing and mashing Bolus transport/lingual motion: Brisk tongue motion Oral residue:  Trace residue lining oral structures Location of oral residue : Tongue Initiation of pharyngeal swallow : Posterior angle of the ramus  Pharyngeal Impairment Domain: Pharyngeal Impairment Domain Soft palate elevation: Trace column of contrast or air between SP and PW Laryngeal elevation: Partial superior movement of thyroid  cartilage/partial approximation of arytenoids to epiglottic petiole Anterior hyoid excursion: Complete anterior movement Epiglottic movement: Complete inversion Laryngeal vestibule closure: Incomplete, narrow column air/contrast in laryngeal vestibule Pharyngeal stripping wave : Present - complete Pharyngeal contraction (A/P view only): N/A Pharyngoesophageal segment opening: Complete distension and complete duration, no obstruction of flow Tongue base retraction: Trace column of contrast or air between tongue base and PPW Pharyngeal residue: Collection of residue within or on pharyngeal structures Location of pharyngeal residue: Valleculae  Esophageal Impairment Domain: Esophageal Impairment Domain Esophageal clearance upright position: Complete clearance, esophageal coating Pill: Pill Consistency administered: Puree; Mildly thick liquids (Level 2, nectar thick) Mildly thick liquids (Level 2, nectar thick): Impaired (see clinical impressions) Puree: WFL Penetration/Aspiration Scale Score: Penetration/Aspiration Scale Score 1.  Material does not enter airway: Puree; Solid; Moderately thick liquids (Level 3, honey thick) 8.  Material enters airway, passes BELOW cords without attempt by patient to eject out (silent aspiration) : Thin liquids (Level 0); Mildly thick liquids (Level 2, nectar thick) Compensatory Strategies: Compensatory Strategies Compensatory strategies: Yes Chin tuck: Ineffective Ineffective Chin Tuck: Thin liquid (Level 0) Left head turn: Ineffective Ineffective Left Head Turn: Thin liquid (Level 0) Right head turn: Ineffective Ineffective Right Head Turn: Thin liquid (Level 0)  Other(comment): Effective (Swallow thin liquid. Cough/clear throat. Swallow again.) Effective Other(comment): Thin liquid (Level 0)   General Information: Caregiver present: Yes  Diet Prior to this Study: Regular; Thin liquids (Level 0)   Temperature : Normal   Respiratory Status: WFL   Supplemental O2: None (Room air)   History of Recent Intubation: No  Behavior/Cognition: Alert; Cooperative; Pleasant mood Self-Feeding Abilities: Able to self-feed Baseline vocal quality/speech: Normal Volitional Cough: Able to elicit Volitional Swallow: Able to elicit Exam Limitations: No limitations Goal Planning: Prognosis for improved oropharyngeal function: Fair Barriers to Reach Goals: Overall medical prognosis Barriers/Prognosis Comment: Impact of radiation/chemotherapy Patient/Family Stated Goal: For patient to increase PO intake. Consulted and agree with results and recommendations: Patient; Family member/caregiver Pain: Pain Assessment Pain Assessment: No/denies pain End of Session: Start Time:SLP Start Time (ACUTE ONLY): 1316 Stop Time: SLP Stop Time (ACUTE ONLY): 1355 Time Calculation:SLP Time Calculation (min) (ACUTE ONLY): 39 min Charges: SLP Evaluations $ SLP Speech Visit: 1 Visit SLP Evaluations $Outpatient MBS Swallow: 1 Procedure SLP visit diagnosis: SLP Visit Diagnosis: Dysphagia, pharyngeal phase (R13.13) Past Medical History: Past Medical History: Diagnosis Date  Arthritis   Cancer (HCC)   History of partial thyroidectomy STATES OVERACTIVE THYROID -- NO ISSUES SINCE AGE 47 AND NO MEDS  History of radiation therapy   07/12/23-09/03/23 Right Tonsil  Dr. Lauraine Golden  Hyperlipidemia   Hypertension   Hypothyroidism   Sleep apnea   Per patient he tried CPAP but could not tolerate.  Thyroid  disease  Past Surgical History: Past Surgical History: Procedure Laterality Date  CIRCUMCISION  01/04/2012  Procedure: CIRCUMCISION ADULT;  Surgeon: Arlena LILLETTE Gal, MD;  [facility]: [facility];  Service: Urology;   Laterality: N/A;  COLONOSCOPY  04/08/2020  Sandor Flatter  CYSTOSCOPY  09/04/2022  Procedure: CYSTOSCOPY;  Surgeon: Matilda Senior, MD;  Location: [facility];  Service: Urology;;  IR GASTROSTOMY TUBE  MOD SED  06/18/2023  IR IMAGING GUIDED PORT INSERTION  06/18/2023  RADIOACTIVE SEED IMPLANT N/A 09/04/2022  Procedure: RADIOACTIVE SEED IMPLANT/BRACHYTHERAPY IMPLANT;  Surgeon: Matilda Senior, MD;  Location: [facility];  Service: Urology;  Laterality: N/A;  90 MINS  SPACE OAR INSTILLATION N/A 09/04/2022  Procedure: SPACE OAR INSTILLATION;  Surgeon: Matilda Senior, MD;  Location: [facility];  Service: Urology;  Laterality: N/A;  THYROIDECTOMY, PARTIAL  AGE 43  OVERACTIVE THYROID  Norleen IVAR Blase, MA, CCC-SLP Speech Therapy CLINICAL DATA:  Provided history: Cough, unspecified type. Dysphagia, unspecified type. EXAM: MODIFIED BARIUM SWALLOW TECHNIQUE: Different consistencies of barium were administered orally to the patient by the speech pathologist with fluoroscopic imaging of the pharynx, as well as limited imaging of the esophagus, acquired in the lateral projection. The radiologist was present in the fluoroscopy room for this study, providing personal supervision. Cine fluoroscopy/video radiography was acquired for the study. FLUOROSCOPY: Radiation Exposure Index (as provided by the fluoroscopic device): 35.50 mGy Kerma COMPARISON:  Modified barium swallow 06/28/2023. FINDINGS: Different consistencies of barium were administered orally to the patient by the speech pathologist with fluoroscopic imaging of the pharynx, as well as limited imaging of the esophagus, acquired in the lateral projection. The radiologist was present in the fluoroscopy room for this study, providing personal supervision. The speech pathologist observed aspiration with thin and nectar consistencies. Please refer to the speech pathologist's report for complete details. Fairly bulky ventral  osteophytes at C5-C6 and C6-C7 (which do not significantly efface the pharynx or cervical esophagus). IMPRESSION: Modified barium swallow as described. Aspiration was observed with thin and nectar consistencies. Please refer to the speech pathologists report for complete details and recommendations. Electronically Signed   By: Rockey Childs D.O.   On: 01/17/2024 14:32    Subjective: No acute issues or events overnight, pain resolved denies nausea vomiting diarrhea constipation headache fevers chills chest pain   Discharge Exam: Vitals:   01/19/24 0750 01/19/24 0828  BP: 120/74 (!) 167/98  Pulse: (!) 101 (!) 107  Resp: 15 20  Temp: 97.7 F (36.5 C) 98.7 F (37.1 C)  SpO2: 99% 100%   Vitals:   01/19/24 0425 01/19/24 0750 01/19/24 0827 01/19/24 0828  BP: (!) 164/89 120/74  (!) 167/98  Pulse: 90 (!) 101  (!) 107  Resp: (!) 21 15  20   Temp: 98.6 F (37 C) 97.7 F (36.5 C)  98.7 F (37.1 C)  TempSrc:  Oral    SpO2: 100% 99%  100%  Weight:   111.8 kg   Height:   6' 1 (1.854 m)     General: Pt is alert, awake, not in acute distress Cardiovascular: RRR, S1/S2 +, no rubs, no gallops Respiratory: CTA bilaterally, no wheezing, no rhonchi Abdominal: Soft, NT, ND, PEG tube clean dry intact Extremities: no edema, no cyanosis    The results of significant diagnostics from this hospitalization (including imaging, microbiology, ancillary and laboratory) are listed below for reference.     Microbiology: No results found for this or any previous visit (from the past 240 hours).   Labs: BNP (last 3 results) No results for input(s): BNP in the last 8760 hours. Basic Metabolic Panel: Recent Labs  Lab 01/19/24 0139 01/19/24 0205 01/19/24 0521  NA 140 140  --   K 3.6 3.5  --   CL 102 101  --   CO2 26  --   --   GLUCOSE 151* 155*  --   BUN 24* 22  --  CREATININE 0.56* 0.60* 0.49*  CALCIUM  9.4  --   --    Liver Function Tests: Recent Labs  Lab 01/19/24 0139  AST 26  ALT  15  ALKPHOS 76  BILITOT 0.6  PROT 6.0*  ALBUMIN 3.8   Recent Labs  Lab 01/19/24 0139  LIPASE 19   No results for input(s): AMMONIA in the last 168 hours. CBC: Recent Labs  Lab 01/19/24 0139 01/19/24 0205 01/19/24 0521  WBC 5.3  --  5.8  HGB 10.7* 10.5* 10.9*  HCT 33.7* 31.0* 35.1*  MCV 98.5  --  98.9  PLT 154  --  143*   Cardiac Enzymes: No results for input(s): CKTOTAL, CKMB, CKMBINDEX, TROPONINI in the last 168 hours. BNP: Invalid input(s): POCBNP CBG: Recent Labs  Lab 01/19/24 0830 01/19/24 1202  GLUCAP 149* 144*   D-Dimer No results for input(s): DDIMER in the last 72 hours. Hgb A1c No results for input(s): HGBA1C in the last 72 hours. Lipid Profile No results for input(s): CHOL, HDL, LDLCALC, TRIG, CHOLHDL, LDLDIRECT in the last 72 hours. Thyroid  function studies No results for input(s): TSH, T4TOTAL, T3FREE, THYROIDAB in the last 72 hours.  Invalid input(s): FREET3 Anemia work up No results for input(s): VITAMINB12, FOLATE, FERRITIN, TIBC, IRON, RETICCTPCT in the last 72 hours. Urinalysis    Component Value Date/Time   COLORURINE YELLOW 01/19/2024 0637   APPEARANCEUR CLEAR 01/19/2024 0637   LABSPEC >1.046 (H) 01/19/2024 0637   PHURINE 7.0 01/19/2024 0637   GLUCOSEU NEGATIVE 01/19/2024 0637   HGBUR NEGATIVE 01/19/2024 0637   BILIRUBINUR NEGATIVE 01/19/2024 0637   KETONESUR NEGATIVE 01/19/2024 0637   PROTEINUR NEGATIVE 01/19/2024 0637   NITRITE NEGATIVE 01/19/2024 0637   LEUKOCYTESUR NEGATIVE 01/19/2024 0637   Sepsis Labs Recent Labs  Lab 01/19/24 0139 01/19/24 0521  WBC 5.3 5.8   Microbiology No results found for this or any previous visit (from the past 240 hours).   Time coordinating discharge: Over 30 minutes  SIGNED:   Elsie JAYSON Montclair, DO [facility] 01/19/2024, 12:39 PM Pager   If 7PM-7AM, please contact night-coverage [email or web address]

## 2024-01-19 NOTE — ED Provider Notes (Signed)
 [place] EMERGENCY DEPARTMENT AT [facility] Provider Note   CSN: [id number] Arrival date & time: 01/19/24  9896     Patient presents with: Emesis and Gtube Issue   DRACEN REIGLE is a 73 y.o. male.   The history is provided by the patient and medical records.  Emesis DEQUAN KINDRED is a 73 y.o. male who presents to the Emergency Department complaining of abdominal pain. He presents the emergency department for evaluation of severe and sudden onset abdominal pain with associated emesis that started around 930 this evening. He drank an oral ensure about 30 minutes prior to the pain beginning. No associated fever, diarrhea. He has dysuria but this is been an ongoing issue. He does have a history of throat cancer status post radiation and chemotherapy, peg tube placement. His peg tube has not been used since September 8. It was last flushed yesterday without difficulty. Note trauma to the abdomen. No additional abdominal surgeries.     Prior to Admission medications   Medication Sig Start Date End Date Taking? Authorizing Provider  ALPRAZolam  (XANAX ) 1 MG tablet TAKE ONE-HALF TABLET BY MOUTH TWO TIMES DAILY AS NEEDED FOR ANXIETY Patient taking differently: Take 0.5 mg by mouth 2 (two) times daily. 11/16/23  Yes Burton, Lacie K, NP  aspirin 81 MG chewable tablet Chew 81 mg by mouth daily.   Yes [provider]  atorvastatin  (LIPITOR) 20 MG tablet Take 20 mg by mouth daily.   Yes [provider]  doxazosin  (CARDURA ) 1 MG tablet TAKE ONE TABLET BY MOUTH AT BEDTIME 12/01/23  Yes Pasam, Avinash, MD  levothyroxine  (SYNTHROID ) 88 MCG tablet TAKE ONE TABLET BY MOUTH DAILY. 07/12/23  Yes [name], [name], MD  pantoprazole  (PROTONIX ) 40 MG tablet Take 40 mg by mouth daily.   Yes [provider]  solifenacin (VESICARE) 5 MG tablet Take 5 mg by mouth daily. 10/12/22  Yes [provider]  amitriptyline  (ELAVIL ) 25 MG tablet Take 1 tablet (25 mg total) by mouth  at bedtime. Patient not taking: Reported on 10/19/2023 10/08/23   Autumn Millman, MD  clotrimazole  (MYCELEX ) 10 MG troche Suck through 1 troche 5 times daily for 14 consecutive days Patient not taking: Reported on 11/23/2023 11/01/23   Izell Domino, MD  dexamethasone  (DEXAMETHASONE  INTENSOL) 1 MG/ML solution TAKE BY MOUTH OR G-TUBE ONCE DAILY FOR THREE DAYS AFTER CHEMO Patient not taking: No sig reported 08/11/23   Pasam, Millman, MD  esomeprazole  (NEXIUM ) 40 MG capsule Take 1 capsule (40 mg total) as directed daily at 12 noon. See instructions to administer through Tube. Patient not taking: Reported on 01/19/2024 12/03/23   Pasam, Millman, MD  fentaNYL  (DURAGESIC ) 50 MCG/HR Place 1 patch onto the skin every 3 (three) days. Patient not taking: Reported on 11/23/2023 10/01/23   Pasam, Millman, MD  fluconazole  (DIFLUCAN ) 40 MG/ML suspension Take 5mL today, then 2.5mL daily for 20 more days. HOLD ATORVASTATIN  WHILE ON THIS. Patient not taking: Reported on 11/23/2023 10/13/23   Walisiewicz, Kaitlyn E, PA-C  hyoscyamine  (LEVSIN AMIEL) 0.125 MG SL tablet Place 2 tablets (0.25 mg total) under the tongue every 6 (six) hours as needed. Patient not taking: Reported on 01/19/2024 10/08/23   Pasam, Millman, MD  lidocaine  (XYLOCAINE ) 2 % solution Patient: Mix 1part 2% viscous lidocaine , 1part water . Swish and spit 10mL of diluted mixture every 2 hours as needed. Patient not taking: Reported on 01/19/2024 08/09/23   Izell Domino, MD  lidocaine -prilocaine  (EMLA ) cream Apply to affected area once  Patient not taking: Reported on 01/19/2024 06/11/23   Autumn Millman, MD  magic mouthwash (nystatin , diphenhydrAMINE, alum & mag hydroxide) suspension mixture Take 5 mLs by mouth 3 (three) times daily as directed. Patient not taking: Reported on 01/19/2024 12/08/23   Pasam, Millman, MD  metoCLOPramide  (REGLAN ) 10 MG/10ML SOLN Place 10 mLs (10 mg total) into feeding tube 3 (three) times daily before meals. Patient not taking: Reported on  10/19/2023 09/08/23   Pasam, Millman, MD  metoCLOPramide  (REGLAN ) 5 MG/5ML solution PLACE INTO FEEDING TUBE THREE TIMES DAILY BEFORE MEALS Patient not taking: Reported on 10/19/2023 09/08/23   Pasam, Millman, MD  Nutritional Supplements (NUTREN 1.5) LIQD 2 cartons Nutren 1.5 (500 ml) QID via tube. Flush with 60 ml water  before/after each bolus. Provide additional 250 ml water  flush 4x/day in between feedings to meet hydration needs. Provides 3000 kcal, 136 g protein, 1528 ml free water  (3008 ml total water ) 2000 ml/day meets 100% DRI Patient not taking: Reported on 01/19/2024 07/09/23   Izell Domino, MD  ondansetron  (ZOFRAN ) 8 MG tablet TAKE ONE TABLET BY MOUTH EVERY 8 HOURS AS NEEDED FOR NAUSEA AND VOMITING. START ON THE THIRD DAY AFTER CISPLATIN  Patient not taking: Reported on 01/19/2024 10/08/23   Pasam, Millman, MD  oxyCODONE  (OXY IR/ROXICODONE ) 5 MG immediate release tablet Take 1 tablet (5 mg total) by mouth every 6 (six) hours as needed for severe pain (pain score 7-10). Patient not taking: Reported on 01/19/2024 11/19/23   Boscia, Heather E, NP  prochlorperazine  (COMPAZINE ) 10 MG tablet Take 1 tablet (10 mg total) by mouth every 6 (six) hours as needed (Nausea or vomiting). Patient not taking: Reported on 01/19/2024 06/11/23   Pasam, Millman, MD  traZODone  (DESYREL ) 50 MG tablet Take 1 tablet (50 mg total) by mouth at bedtime. Patient not taking: Reported on 11/23/2023 09/03/23   Autumn Millman, MD    Allergies: Morphine , Other, and Testosterone    Review of Systems  Gastrointestinal:  Positive for vomiting.  All other systems reviewed and are negative.   Updated Vital Signs BP (!) 164/89   Pulse 90   Temp 98.6 F (37 C)   Resp (!) 21   Ht 6' 1 (1.854 m)   Wt 111.7 kg   SpO2 100%   BMI 32.49 kg/m   Physical Exam Vitals and nursing note reviewed.  Constitutional:      General: He is in acute distress.     Appearance: He is well-developed. He is ill-appearing.  HENT:     Head:  Normocephalic and atraumatic.  Cardiovascular:     Rate and Rhythm: Normal rate and regular rhythm.     Heart sounds: No murmur heard. Pulmonary:     Effort: Pulmonary effort is normal. No respiratory distress.     Breath sounds: Normal breath sounds.  Abdominal:     Palpations: Abdomen is soft.     Tenderness: There is no guarding or rebound.     Comments: Moderate left upper quadrant tenderness with peg tube in place.  Musculoskeletal:        General: No tenderness.  Skin:    General: Skin is warm and dry.  Neurological:     Mental Status: He is alert and oriented to person, place, and time.  Psychiatric:        Behavior: Behavior normal.     (all labs ordered are listed, but only abnormal results are displayed) Labs Reviewed  COMPREHENSIVE METABOLIC PANEL WITH GFR - Abnormal; Notable for the following  components:      Result Value   Glucose, Bld 151 (*)    BUN 24 (*)    Creatinine, Ser 0.56 (*)    Total Protein 6.0 (*)    All other components within normal limits  CBC - Abnormal; Notable for the following components:   RBC 3.42 (*)    Hemoglobin 10.7 (*)    HCT 33.7 (*)    All other components within normal limits  URINALYSIS, ROUTINE W REFLEX MICROSCOPIC - Abnormal; Notable for the following components:   Specific Gravity, Urine >1.046 (*)    All other components within normal limits  CBC - Abnormal; Notable for the following components:   RBC 3.55 (*)    Hemoglobin 10.9 (*)    HCT 35.1 (*)    Platelets 143 (*)    All other components within normal limits  CREATININE, SERUM - Abnormal; Notable for the following components:   Creatinine, Ser 0.49 (*)    All other components within normal limits  I-STAT CHEM 8, ED - Abnormal; Notable for the following components:   Creatinine, Ser 0.60 (*)    Glucose, Bld 155 (*)    Hemoglobin 10.5 (*)    HCT 31.0 (*)    All other components within normal limits  LIPASE, BLOOD  I-STAT CG4 LACTIC ACID, ED  I-STAT CG4 LACTIC  ACID, ED    EKG: EKG Interpretation Date/Time:  Wednesday January 19 2024 03:39:36 EDT Ventricular Rate:  85 PR Interval:  184 QRS Duration:  100 QT Interval:  420 QTC Calculation: 500 R Axis:   97  Text Interpretation: Sinus rhythm Right axis deviation Low voltage, extremity leads Borderline prolonged QT interval Confirmed by Griselda Norris [phone]) on 01/19/2024 4:35:38 AM  Radiology: CT ABDOMEN PELVIS W CONTRAST Result Date: 01/19/2024 EXAM: CT ABDOMEN AND PELVIS WITH CONTRAST 01/19/2024 03:16:59 AM TECHNIQUE: CT of the abdomen and pelvis was performed with the administration of 100 ml of intravenous iohexol  (OMNIPAQUE ) 300 MG/ML solution and 25 ml of iohexol  (OMNIPAQUE ) 300 MG/ML solution via G-tube. Multiplanar reformatted images are provided for review. Automated exposure control, iterative reconstruction, and/or weight-based adjustment of the mA/kV was utilized to reduce the radiation dose to as low as reasonably achievable. COMPARISON: 09/24/2022 and PET/CT 11/16/2023. CLINICAL HISTORY: Acute, nonlocalized abdominal pain, LUQ abdominal pain, and emesis starting this AM. Patient is visibly uncomfortable. History of tonsillar cancer with G-tube placement (not used since 9/8, flushed daily with bleeding from site this AM, followed by vomiting). Prior chemo and radiation treatments. WBCs 5.3, GFR > 60. Swallow function test 01/17/24. FINDINGS: LOWER CHEST: No acute abnormality. LIVER: The liver is unremarkable. GALLBLADDER AND BILE DUCTS: Gallbladder is unremarkable. No biliary ductal dilatation. SPLEEN: No acute abnormality. PANCREAS: No acute abnormality. ADRENAL GLANDS: No acute abnormality. KIDNEYS, URETERS AND BLADDER: Nonobstructing right nephrolithiasis. No stones in the left kidney or ureters. No hydronephrosis. No perinephric or periureteral stranding. Urinary bladder is unremarkable. GI AND BOWEL: Percutaneous gastrostomy tube in expected position. Contrast within the stomach. No  extraluminal contrast. Additional dense contrast in the right colon and rectum. There is no bowel obstruction. PERITONEUM AND RETROPERITONEUM: No ascites. No free air. VASCULATURE: Aorta is calcified. LYMPH NODES: Misty mesenteric fat with prominent subcentimeter lymph nodes and perinodal fat sparing compatible with mesenteric panniculitis. REPRODUCTIVE ORGANS: Metallic seeds in the prostate. BONES AND SOFT TISSUES: No acute osseous abnormality. No focal soft tissue abnormality. IMPRESSION: 1. No acute abnormalities in the abdomen or pelvis. 2. Percutaneous gastrostomy tube appropriately positioned without  leak. 3. Mesenteric panniculitis. 4. Nonobstructing right nephrolithiasis. Electronically signed by: Norman Gatlin MD 01/19/2024 03:37 AM EDT RP Workstation: [id number]   DG Chest Port 1 View Result Date: 01/19/2024 CLINICAL DATA:  Upper abdominal pain and hypoxia. EXAM: PORTABLE CHEST 1 VIEW COMPARISON:  Portable chest 08/14/2023 FINDINGS: Right IJ port catheter terminates at the superior cavoatrial junction, as before. There is mild cardiomegaly. No evidence of CHF. The mediastinum is normally outlined. There is calcification of the transverse aorta. Visualized lungs clear with both CP sulci excluded from the study. Thoracic spondylosis. No new osseous findings. Thoracic cage is intact. Overlying telemetry leads. IMPRESSION: 1. No evidence of acute chest disease. 2. Mild cardiomegaly. 3. Aortic atherosclerosis. Electronically Signed   By: Francis Quam M.D.   On: 01/19/2024 02:29   DG SWALLOW FUNC OP MEDICARE SPEECH PATH Result Date: 01/17/2024 Table formatting from the original result was not included. Modified Barium Swallow Study Patient Details Name: QUAMAINE WEBB MRN: [id number] Date of Birth: 07-Aug-1950 Today's Date: 01/17/2024 HPI/PMH: HPI: Patient is a 73 y.o. 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 He presented to his PCP on 05/17/23  with complaints of right sided jaw pain when coughing. CT neck revealed an ulcerated enhancing mass involving the superior oropharynx and soft palate/uvula in the greatest extent that is concerning for malignancy. PET on 06/07/23 revealed a right oropharyngeal tumor. Patient reported extreme pain when swallowing that prevented him from eating or swallowing on 06/08/23. Before beginning radiation, a  PEG/PAC was done on 2/28 and an MBS was done on 06/28/23. Patient began treatment on 07/12/23 which included 35 fractions of radiation to his right Tonsil and bilateral neck with weekly chemotherapy ending on 08/27/23. Patient has been seeing OP SLP. On 01/04/24, OP SLP recommended MBS due to throat clearing during session with water  and patient reported nasal regurgitation with milk. Patient brought in for OP MBS on 01/17/24. Clinical Impression: Clinical Impression: Patient presents with a primary pharyngeal phase dysphagia and compared to MBS in March of this year, prior to starting radiation treatment, his swallow function has declined. He himself indicates that his primary nutrition has been 6 Ensure shakes per day but that most foods don't taste right and although he has a desire to eat, but after taking a couple bites, he typically loses all interest. Sensed aspiration occured with thin liquids (PAS 7) at the beginning of the study and silent aspiration occured at the end of the study. Questionable aspiration with nectar thick liquids. Penetration to the vocal cords (PAS 5) that did not clear occured consistently with thin liquids. Pharyngeal impairments include, partial superior movement of the thyroid  cartilage, partial laryngeal elevation, incomplete laryngeal vestibule closure. Patient's epiglottis had bulbous appearance, a change from last MBS on 06/28/23. Chin tuck, left head turn, right head turn were all unsuccessful in preventing penetration/aspiration. Strategy of cough/throat clear and reswallow was the most  effective strategy in helping patient clear penetration. Barium tablet became lodged at level of valleculae when swallowed with nectar thick liquid. Tablet fully transited when followed with puree. SLP recommending continue on current diet of regular/thin. Recommending to use strategy of performing a cough/throat clear, then reswallow after sips of liquids. In addition, SLP recommending patient refrain from large sips of liquids and/or consecutive sips of liquids. Encouraged patient to discuss further with OP SLP next scheduled visit. Recommendations/Plan: Swallowing Evaluation Recommendations Swallowing Evaluation Recommendations Recommendations: PO diet PO Diet Recommendation: Regular; Thin liquids (Level  0) Liquid Administration via: Cup; Straw Medication Administration: Whole meds with puree Supervision: Patient able to self-feed Swallowing strategies  : Slow rate; Small bites/sips; Hard cough after swallowing Postural changes: Position pt fully upright for meals; Stay upright 30-60 min after meals Oral care recommendations: Oral care BID (2x/day) Treatment Plan Treatment Plan Treatment recommendations: Defer treatment plan to SLP at other venue (see follow-up recommendations) Follow-up recommendations: Outpatient SLP Functional status assessment: Patient has had a recent decline in their functional status and demonstrates the ability to make significant improvements in function in a reasonable and predictable amount of time. Interventions: Patient/family education; Oropharyngeal exercises Recommendations Recommendations for follow up therapy are one component of a multi-disciplinary discharge planning process, led by the attending physician.  Recommendations may be updated based on patient status, additional functional criteria and insurance authorization. Assessment: Orofacial Exam: Orofacial Exam Oral Cavity: Oral Hygiene: WFL Oral Cavity - Dentition: Adequate natural dentition Orofacial Anatomy: WFL Oral  Motor/Sensory Function: WFL Anatomy: Anatomy: WFL Boluses Administered: Boluses Administered Boluses Administered: Thin liquids (Level 0); Mildly thick liquids (Level 2, nectar thick); Moderately thick liquids (Level 3, honey thick); Puree; Solid  Oral Impairment Domain: Oral Impairment Domain Lip Closure: No labial escape Tongue control during bolus hold: Cohesive bolus between tongue to palatal seal Bolus preparation/mastication: Timely and efficient chewing and mashing Bolus transport/lingual motion: Brisk tongue motion Oral residue: Trace residue lining oral structures Location of oral residue : Tongue Initiation of pharyngeal swallow : Posterior angle of the ramus  Pharyngeal Impairment Domain: Pharyngeal Impairment Domain Soft palate elevation: Trace column of contrast or air between SP and PW Laryngeal elevation: Partial superior movement of thyroid  cartilage/partial approximation of arytenoids to epiglottic petiole Anterior hyoid excursion: Complete anterior movement Epiglottic movement: Complete inversion Laryngeal vestibule closure: Incomplete, narrow column air/contrast in laryngeal vestibule Pharyngeal stripping wave : Present - complete Pharyngeal contraction (A/P view only): N/A Pharyngoesophageal segment opening: Complete distension and complete duration, no obstruction of flow Tongue base retraction: Trace column of contrast or air between tongue base and PPW Pharyngeal residue: Collection of residue within or on pharyngeal structures Location of pharyngeal residue: Valleculae  Esophageal Impairment Domain: Esophageal Impairment Domain Esophageal clearance upright position: Complete clearance, esophageal coating Pill: Pill Consistency administered: Puree; Mildly thick liquids (Level 2, nectar thick) Mildly thick liquids (Level 2, nectar thick): Impaired (see clinical impressions) Puree: WFL Penetration/Aspiration Scale Score: Penetration/Aspiration Scale Score 1.  Material does not enter airway:  Puree; Solid; Moderately thick liquids (Level 3, honey thick) 8.  Material enters airway, passes BELOW cords without attempt by patient to eject out (silent aspiration) : Thin liquids (Level 0); Mildly thick liquids (Level 2, nectar thick) Compensatory Strategies: Compensatory Strategies Compensatory strategies: Yes Chin tuck: Ineffective Ineffective Chin Tuck: Thin liquid (Level 0) Left head turn: Ineffective Ineffective Left Head Turn: Thin liquid (Level 0) Right head turn: Ineffective Ineffective Right Head Turn: Thin liquid (Level 0) Other(comment): Effective (Swallow thin liquid. Cough/clear throat. Swallow again.) Effective Other(comment): Thin liquid (Level 0)   General Information: Caregiver present: Yes  Diet Prior to this Study: Regular; Thin liquids (Level 0)   Temperature : Normal   Respiratory Status: WFL   Supplemental O2: None (Room air)   History of Recent Intubation: No  Behavior/Cognition: Alert; Cooperative; Pleasant mood Self-Feeding Abilities: Able to self-feed Baseline vocal quality/speech: Normal Volitional Cough: Able to elicit Volitional Swallow: Able to elicit Exam Limitations: No limitations Goal Planning: Prognosis for improved oropharyngeal function: Fair Barriers to Reach Goals: Overall medical prognosis Barriers/Prognosis  Comment: Impact of radiation/chemotherapy Patient/Family Stated Goal: For patient to increase PO intake. Consulted and agree with results and recommendations: Patient; Family member/caregiver Pain: Pain Assessment Pain Assessment: No/denies pain End of Session: Start Time:SLP Start Time (ACUTE ONLY): 1316 Stop Time: SLP Stop Time (ACUTE ONLY): 1355 Time Calculation:SLP Time Calculation (min) (ACUTE ONLY): 39 min Charges: SLP Evaluations $ SLP Speech Visit: 1 Visit SLP Evaluations $Outpatient MBS Swallow: 1 Procedure SLP visit diagnosis: SLP Visit Diagnosis: Dysphagia, pharyngeal phase (R13.13) Past Medical History: Past Medical History: Diagnosis Date  Arthritis    Cancer (HCC)   History of partial thyroidectomy STATES OVERACTIVE THYROID -- NO ISSUES SINCE AGE 59 AND NO MEDS  History of radiation therapy   07/12/23-09/03/23 Right Tonsil  Dr. Lauraine Golden  Hyperlipidemia   Hypertension   Hypothyroidism   Sleep apnea   Per patient he tried CPAP but could not tolerate.  Thyroid  disease  Past Surgical History: Past Surgical History: Procedure Laterality Date  CIRCUMCISION  01/04/2012  Procedure: CIRCUMCISION ADULT;  Surgeon: Arlena LILLETTE Gal, MD;  Location: [facility];  Service: Urology;  Laterality: N/A;  COLONOSCOPY  04/08/2020  Vito Cirigliano  CYSTOSCOPY  09/04/2022  Procedure: CYSTOSCOPY;  Surgeon: Matilda Senior, MD;  Location: [facility];  Service: Urology;;  IR GASTROSTOMY TUBE MOD SED  06/18/2023  IR IMAGING GUIDED PORT INSERTION  06/18/2023  RADIOACTIVE SEED IMPLANT N/A 09/04/2022  Procedure: RADIOACTIVE SEED IMPLANT/BRACHYTHERAPY IMPLANT;  Surgeon: Matilda Senior, MD;  Location: [facility];  Service: Urology;  Laterality: N/A;  90 MINS  SPACE OAR INSTILLATION N/A 09/04/2022  Procedure: SPACE OAR INSTILLATION;  Surgeon: Matilda Senior, MD;  Location[facility];  Service: Urology;  Laterality: N/A;  THYROIDECTOMY, PARTIAL  AGE 59  OVERACTIVE THYROID  Norleen IVAR Blase, MA, CCC-SLP Speech Therapy CLINICAL DATA:  Provided history: Cough, unspecified type. Dysphagia, unspecified type. EXAM: MODIFIED BARIUM SWALLOW TECHNIQUE: Different consistencies of barium were administered orally to the patient by the speech pathologist with fluoroscopic imaging of the pharynx, as well as limited imaging of the esophagus, acquired in the lateral projection. The radiologist was present in the fluoroscopy room for this study, providing personal supervision. Cine fluoroscopy/video radiography was acquired for the study. FLUOROSCOPY: Radiation Exposure Index (as provided by the fluoroscopic device): 35.50 mGy Kerma  COMPARISON:  Modified barium swallow 06/28/2023. FINDINGS: Different consistencies of barium were administered orally to the patient by the speech pathologist with fluoroscopic imaging of the pharynx, as well as limited imaging of the esophagus, acquired in the lateral projection. The radiologist was present in the fluoroscopy room for this study, providing personal supervision. The speech pathologist observed aspiration with thin and nectar consistencies. Please refer to the speech pathologist's report for complete details. Fairly bulky ventral osteophytes at C5-C6 and C6-C7 (which do not significantly efface the pharynx or cervical esophagus). IMPRESSION: Modified barium swallow as described. Aspiration was observed with thin and nectar consistencies. Please refer to the speech pathologists report for complete details and recommendations. Electronically Signed   By: Rockey Childs D.O.   On: 01/17/2024 14:32    Procedures   Medications Ordered in the ED  enoxaparin (LOVENOX) injection 60 mg (has no administration in time range)  acetaminophen  (TYLENOL ) tablet 650 mg (has no administration in time range)  prochlorperazine  (COMPAZINE ) injection 5 mg (has no administration in time range)  melatonin tablet 5 mg (has no administration in time range)  polyethylene glycol (MIRALAX  / GLYCOLAX ) packet 17 g (has no administration in time  range)  oxyCODONE  (Oxy IR/ROXICODONE ) immediate release tablet 5 mg (5 mg Oral Given 01/19/24 0530)  lactated ringers  infusion (has no administration in time range)  HYDROmorphone (DILAUDID) injection 0.5 mg (has no administration in time range)  ALPRAZolam  (XANAX ) tablet 0.5 mg (has no administration in time range)  atorvastatin  (LIPITOR) tablet 20 mg (has no administration in time range)  levothyroxine  (SYNTHROID ) tablet 88 mcg (has no administration in time range)  pantoprazole  (PROTONIX ) EC tablet 40 mg (has no administration in time range)  doxazosin  (CARDURA ) tablet 1  mg (has no administration in time range)  HYDROmorphone (DILAUDID) injection 1 mg (1 mg Intravenous Given 01/19/24 0157)  ondansetron  (ZOFRAN ) injection 4 mg (4 mg Intravenous Given 01/19/24 0157)  iohexol  (OMNIPAQUE ) 300 MG/ML solution 100 mL (100 mLs Intravenous Contrast Given 01/19/24 0300)  iohexol  (OMNIPAQUE ) 300 MG/ML solution 25 mL (25 mLs Per Tube Contrast Given 01/19/24 0300)  sodium chloride  0.9 % bolus 500 mL (500 mLs Intravenous New Bag/Given 01/19/24 0439)                                    Medical Decision Making Amount and/or Complexity of Data Reviewed Labs: ordered. Radiology: ordered.  Risk Prescription drug management. Decision regarding hospitalization.   Patient with history of throat cancer in remission with peg tube in place here for evaluation of acute left upper quadrant abdominal pain. Patient in distress at time of ED arrival, appears very uncomfortable. He is significantly tender on examination. Lactic acid is within normal limits. He did have partial improvement in his pain after hydromorphone administration. CT abdomen pelvis was obtained, which demonstrates mesenteric panicky lightest, no additional findings. Given patient's persistent ongoing pain plan to admit for ongoing management.      Final diagnoses:  None    ED Discharge Orders     None          Griselda Norris, MD 01/19/24 [phone]

## 2024-01-20 ENCOUNTER — Telehealth: Payer: Self-pay

## 2024-01-20 ENCOUNTER — Telehealth: Payer: Self-pay | Admitting: Oncology

## 2024-01-20 ENCOUNTER — Encounter: Admitting: Dietician

## 2024-01-20 NOTE — Telephone Encounter (Signed)
 [name] has been scheduled to see Dr. Autumn on 10/3 for his hospital follow up. Isaac Carpenter

## 2024-01-20 NOTE — Telephone Encounter (Signed)
 Followed up on patient's status since he returned home from the ED yesterday due to abdominal pain around his G-Tube site. Wife reported that he had no pain right now unless the G-tube insertion site was pressed on. Also, Isaac Carpenter is taking in adequate nutrition. Denied nausea or vomiting.   Scan on chart verify placement during ED visit.   Message sent to scheduler to reschedule patient.

## 2024-01-21 ENCOUNTER — Inpatient Hospital Stay: Attending: Nurse Practitioner

## 2024-01-21 ENCOUNTER — Other Ambulatory Visit: Payer: Self-pay

## 2024-01-21 ENCOUNTER — Inpatient Hospital Stay (HOSPITAL_BASED_OUTPATIENT_CLINIC_OR_DEPARTMENT_OTHER): Admitting: Oncology

## 2024-01-21 ENCOUNTER — Encounter: Payer: Self-pay | Admitting: Oncology

## 2024-01-21 VITALS — BP 113/53 | HR 88 | Temp 97.3°F | Resp 18 | Ht 73.0 in | Wt 246.0 lb

## 2024-01-21 DIAGNOSIS — C090 Malignant neoplasm of tonsillar fossa: Secondary | ICD-10-CM | POA: Insufficient documentation

## 2024-01-21 DIAGNOSIS — R432 Parageusia: Secondary | ICD-10-CM | POA: Insufficient documentation

## 2024-01-21 DIAGNOSIS — Z9221 Personal history of antineoplastic chemotherapy: Secondary | ICD-10-CM | POA: Insufficient documentation

## 2024-01-21 DIAGNOSIS — Z79899 Other long term (current) drug therapy: Secondary | ICD-10-CM | POA: Insufficient documentation

## 2024-01-21 DIAGNOSIS — Z7982 Long term (current) use of aspirin: Secondary | ICD-10-CM | POA: Insufficient documentation

## 2024-01-21 DIAGNOSIS — C61 Malignant neoplasm of prostate: Secondary | ICD-10-CM

## 2024-01-21 DIAGNOSIS — Z923 Personal history of irradiation: Secondary | ICD-10-CM | POA: Insufficient documentation

## 2024-01-21 DIAGNOSIS — T451X5A Adverse effect of antineoplastic and immunosuppressive drugs, initial encounter: Secondary | ICD-10-CM

## 2024-01-21 DIAGNOSIS — Z8546 Personal history of malignant neoplasm of prostate: Secondary | ICD-10-CM | POA: Insufficient documentation

## 2024-01-21 DIAGNOSIS — D701 Agranulocytosis secondary to cancer chemotherapy: Secondary | ICD-10-CM

## 2024-01-21 DIAGNOSIS — D649 Anemia, unspecified: Secondary | ICD-10-CM | POA: Insufficient documentation

## 2024-01-21 DIAGNOSIS — R059 Cough, unspecified: Secondary | ICD-10-CM | POA: Insufficient documentation

## 2024-01-21 DIAGNOSIS — R6884 Jaw pain: Secondary | ICD-10-CM | POA: Insufficient documentation

## 2024-01-21 LAB — CMP (CANCER CENTER ONLY)
ALT: 11 U/L (ref 0–44)
AST: 16 U/L (ref 15–41)
Albumin: 3.6 g/dL (ref 3.5–5.0)
Alkaline Phosphatase: 71 U/L (ref 38–126)
Anion gap: 4 — ABNORMAL LOW (ref 5–15)
BUN: 20 mg/dL (ref 8–23)
CO2: 32 mmol/L (ref 22–32)
Calcium: 9.3 mg/dL (ref 8.9–10.3)
Chloride: 102 mmol/L (ref 98–111)
Creatinine: 0.59 mg/dL — ABNORMAL LOW (ref 0.61–1.24)
GFR, Estimated: >60 mL/min (ref >60)
Glucose, Bld: 144 mg/dL — ABNORMAL HIGH (ref 70–99)
Potassium: 3.7 mmol/L (ref 3.5–5.1)
Sodium: 138 mmol/L (ref 135–145)
Total Bilirubin: 1.2 mg/dL (ref 0.0–1.2)
Total Protein: 6.5 g/dL (ref 6.5–8.1)

## 2024-01-21 LAB — CBC WITH DIFFERENTIAL (CANCER CENTER ONLY)
Abs Immature Granulocytes: 0.02 K/uL (ref 0.00–0.07)
Basophils Absolute: 0 K/uL (ref 0.0–0.1)
Basophils Relative: 0 %
Eosinophils Absolute: 0.2 K/uL (ref 0.0–0.5)
Eosinophils Relative: 3 %
HCT: 32.4 % — ABNORMAL LOW (ref 39.0–52.0)
Hemoglobin: 10.9 g/dL — ABNORMAL LOW (ref 13.0–17.0)
Immature Granulocytes: 0 %
Lymphocytes Relative: 8 %
Lymphs Abs: 0.5 K/uL — ABNORMAL LOW (ref 0.7–4.0)
MCH: 31.9 pg (ref 26.0–34.0)
MCHC: 33.6 g/dL (ref 30.0–36.0)
MCV: 94.7 fL (ref 80.0–100.0)
Monocytes Absolute: 0.6 K/uL (ref 0.1–1.0)
Monocytes Relative: 10 %
Neutro Abs: 4.7 K/uL (ref 1.7–7.7)
Neutrophils Relative %: 79 %
Platelet Count: 165 K/uL (ref 150–400)
RBC: 3.42 MIL/uL — ABNORMAL LOW (ref 4.22–5.81)
RDW: 13.7 % (ref 11.5–15.5)
WBC Count: 5.9 K/uL (ref 4.0–10.5)
nRBC: 0 % (ref 0.0–0.2)

## 2024-01-21 LAB — MAGNESIUM: Magnesium: 2 mg/dL (ref 1.7–2.4)

## 2024-01-21 NOTE — Progress Notes (Signed)
 [facility]  ONCOLOGY CLINIC PROGRESS NOTE   Patient Care Team: [name], [name], MD as PCP - General (Family Medicine) Vertell Pont, RN as Oncology Nurse Navigator Octavia Charleston, MD as Referring Physician (Ophthalmology) Matilda Senior, MD as Consulting Physician (Urology) Patrcia Cough, MD as Consulting Physician (Radiation Oncology) Starla Wendelyn BIRCH, RN as Registered Nurse Crawford, Morna Pickle, NP as Nurse Practitioner (Hematology and Oncology) Izell Domino, MD as Attending Physician (Radiation Oncology) Okey Burns, MD as Consulting Physician (Otolaryngology) Malmfelt, Delon CROME, RN as Oncology Nurse Navigator Autumn Millman, MD as Consulting Physician (Oncology)  PATIENT NAME: Isaac Carpenter   MR#: [id number] DOB: 1950/05/13  Date of visit: 01/21/2024   ASSESSMENT & PLAN:   Isaac Carpenter is a 73 y.o. gentleman with a past medical history of prostate cancer diagnosed in March 2024, S/P radioactive seed implant/brachytherapy, hypertension, dyslipidemia, hypothyroidism, obstructive sleep apnea. He presented for follow up of recently diagnosed squamous cell carcinoma of the right tonsil, clinical stage II disease (cT3,cN2,cM0,p16+).   Malignant neoplasm of tonsillar fossa (HCC) Please review oncology history for additional details and timeline of events.  Symptoms include severe pain and dysphagia since mid-November, progressively worsening.   cT3,cN2,cM0,p16+ tumor, Stage II disease.   His case was discussed in tumor conference on 06/09/2023.  Given the extent of disease, consensus opinion is to proceed with concurrent chemoradiation. We have discussed about role of cisplatin  being a radiosensitizer in the treatment of head and neck cancer.  We have discussed about the curative intent of chemoradiation for this patient.  Patient was willing to proceed with weekly cisplatin  after discussing risk versus benefits and side effect profile.  He started  cycle 1 of cisplatin  at a dose of 40 mg/m on 07/09/2023.  Plan was to continue cisplatin  weekly during the course of radiation. He began radiation treatments from 07/12/2023.  During week 2, chemotherapy had to be held because of severe fatigue with ECOG performance status of 3.  He received total of 4 cycles of Cisplatin . Last cisplatin  was on 08/06/23. Stopped early because of side effects including neutropenia/ leukopenia, fatigue, nausea, weight loss.   Restaging PET scan on 11/16/2023 showed overall improvement in disease without evidence of disease progression.  His case was discussed in tumor conference earlier today and consensus opinion is that there is no evidence of disease.  Will continue surveillance as per NCCN guidelines.  Will obtain CT neck, chest in the first week of December 2025 and see him in clinic with results.  He is scheduled to see Dr. Soldatova on 04/06/2024 for ENT follow-up.  Anemia likely secondary to cancer treatment Anemia likely secondary to cancer treatment, with hemoglobin at 10.9. Nutritional status is expected to improve, which should help increase hemoglobin levels. Other blood work, including white count, platelets, kidney, liver numbers, and electrolytes, are within normal limits. - Monitor hemoglobin levels as nutritional status improves  Dysgeusia (altered taste) Persistent dysgeusia following chemotherapy, lasting longer than typical. Reports most foods taste unpleasant, particularly meats. This is expected to improve over time, although it is taking longer in this case. - Encourage trying mints or hard candies to help improve taste sensation  Mucous retention in throat Mucous retention in the throat causing difficulty in expectoration, especially in the morning. Not associated with dry mouth. Previously used Mucinex but not recently. - Recommend using Mucinex or Robitussin with guaifenesin 400 mg - Advise inhaling steam once a day, particularly in the  morning, to help loosen mucus  PEG tube status, planned removal PEG tube has not been used since September 8th. Now able to eat and drink by mouth, although taste is altered. Some bleeding around the tube site, likely due to debris, and the tube is no longer necessary for nutritional support. - Arrange for removal of PEG tube  Port-a-cath status, planned removal after December Port-a-cath is currently in place. Scheduled to see the ENT doctor in December, and it is planned to remove the port after this appointment. - Plan for removal of port-a-cath after December ENT appointment  I reviewed lab results and outside records for this visit and discussed relevant results with the patient. Diagnosis, plan of care and treatment options were also discussed in detail with the patient. Opportunity provided to ask questions and answers provided to his apparent satisfaction. Provided instructions to call our clinic with any problems, questions or concerns prior to return visit. I recommended to continue follow-up with PCP and sub-specialists. He verbalized understanding and agreed with the plan.   NCCN guidelines have been consulted in the planning of this patient's care.  I spent a total of 30 minutes during this encounter with the patient including review of chart and various tests results, discussions about plan of care and coordination of care plan.   Chinita Patten, MD  01/21/2024 7:09 PM  [facility] - A DEPT OF [facility] [address] [place] [address] Dept: [phone] Dept Fax: [phone]    CHIEF COMPLAINT/ REASON FOR VISIT:   Follow-up for squamous cell carcinoma of the right tonsil, stage II disease (cT3,cN2,cM0,p16+)  Current Treatment: Concurrent chemoradiation with weekly cisplatin  started from 07/09/2023.  INTERVAL HISTORY:    Discussed the use of AI scribe software for clinical note transcription with  the patient, who gave verbal consent to proceed.   History of Present Illness  Isaac Carpenter is a 73 year old male who presents for follow-up regarding his recovery and nutritional status post-cancer treatment.  He has not used his feeding tube since September 8 and is now consuming Ensure by mouth. Most foods taste unpleasant, and his last chemotherapy treatment was in April. He has tried lemon drops and other candies to improve taste, but finds them ineffective.  There is some dark discharge around the site of his feeding tube, which is a new occurrence.  He experienced significant pain during a recent hospital visit, which was alleviated with Dilaudid and a heparin  shot. He initially suspected a kidney stone due to past experiences but did not confirm this. The pain was localized to his stomach and was tender the following day but has since resolved.  He mentions an increase in the growth rate of his fingernails and mustache, requiring more frequent trimming.  He reports persistent phlegm in his throat, which is difficult to clear, especially in the mornings. He has previously used Mucinex but not recently, and finds that drinking fluids helps to some extent.  His caregiver notes that he received good care during his recent hospital stay.    I have reviewed the past medical history, past surgical history, social history and family history with the patient and they are unchanged from previous note.  HISTORY OF PRESENT ILLNESS:   ONCOLOGY HISTORY:   He presented to his PCP Dr Betty [name] on 05-17-23 with complains of right-sided jaw/maxillary pain that tends to occur when coughing. Pain had persisted for several months at that time following  an upper respiratory infection.    Subsequently, he underwent a CT soft tissue neck on 05-17-23 which revealed an ulcerated enhancing mass involving the superior oropharynx and soft palate/uvula measuring 3.4 x 3.0 x 3.6 cm in the greatest  extent that is concerning for malignancy. No evidence of cervical lymphadenopathy was indicated on scan    In light of findings, the patient saw Dr. Elena Larry on 05-27-23 with worsening symptoms. During his visit, he underwent a flexible fiberoptic laryngoscopy with several biopsies of the friable tissue from the right oropharyngeal mass.     Biopsy of right oropharynx mass on 05-27-23 revealed: Invasive moderately differentiated squamous cell carcinoma. Immunohistochemistry for p16 shows diffuse strong positivity.    During most recent visit with Dr. Soldatova on 06-04-23, patient reported constant worsening symptoms of odynophagia and dysphagia. Stating that he's unable to maintain adequate nutrition as food typically consists of few bites of soft food and liquids. Pain does not seem to be managed by pain relievers including oxycodone . To further evaluate the extent of the disease, he underwent a PET scan performed on 06-07-23 revealing a right  oropharyngeal tumor >4cm, likely b/l level II adenopathy, no distant metastases.    cT3,cN2,cM0,p16+ tumor, Stage II disease.    Plan made for concurrent chemoradiation with weekly cisplatin . Started Cisplatin  from 07/09/2023 and radiation from 07/12/23.   He received total of 4 cycles of Cisplatin . Last cisplatin  was on 08/06/23. Stopped early because of side effects including neutropenia/ leukopenia, fatigue, nausea, weight loss.   Restaging PET scan on 11/16/2023 showed overall improvement in disease without evidence of disease progression.  Oncology History  Malignant neoplasm of prostate (HCC)  05/13/2022 Cancer Staging   Staging form: Prostate, AJCC 8th Edition - Clinical stage from 05/13/2022: Stage IIC (cT1c, cN0, cM0, PSA: 13.2, Grade Group: 3) - Signed by Sherwood Rise, PA-C on 07/17/2022 Histopathologic type: Adenocarcinoma, NOS Stage prefix: Initial diagnosis Prostate specific antigen (PSA) range: 10 to 19 Gleason primary pattern: 4 Gleason  secondary pattern: 3 Gleason score: 7 Histologic grading system: 5 grade system Number of biopsy cores examined: 12 Number of biopsy cores positive: 4 Location of positive needle core biopsies: Both sides   07/17/2022 Initial Diagnosis   Malignant neoplasm of prostate (HCC)   Malignant neoplasm of tonsillar fossa (HCC)  06/08/2023 Initial Diagnosis   Malignant neoplasm of tonsillar fossa (HCC)   06/08/2023 Cancer Staging   Staging form: Pharynx - HPV-Mediated Oropharynx, AJCC 8th Edition - Clinical stage from 06/08/2023: Stage II (cT3, cN2, cM0, p16+) - Signed by Izell Domino, MD on 06/08/2023 Stage prefix: Initial diagnosis   07/09/2023 - 09/09/2023 Chemotherapy   Patient is on Treatment Plan : HEAD/NECK Cisplatin  (40) q7d         REVIEW OF SYSTEMS:   Review of Systems - Oncology  All other pertinent systems were reviewed with the patient and are negative.  ALLERGIES: He is allergic to morphine , other, and testosterone.  MEDICATIONS:  Current Outpatient Medications  Medication Sig Dispense Refill   ALPRAZolam  (XANAX ) 1 MG tablet TAKE ONE-HALF TABLET BY MOUTH TWO TIMES DAILY AS NEEDED FOR ANXIETY (Patient taking differently: Take 0.5 mg by mouth 2 (two) times daily.) 60 tablet 0   aspirin 81 MG chewable tablet Chew 81 mg by mouth daily.     atorvastatin  (LIPITOR) 20 MG tablet Take 20 mg by mouth daily.     doxazosin  (CARDURA ) 1 MG tablet TAKE ONE TABLET BY MOUTH AT BEDTIME 30 tablet 1   levothyroxine  (SYNTHROID )  88 MCG tablet TAKE ONE TABLET BY MOUTH DAILY. 90 tablet 2   pantoprazole  (PROTONIX ) 40 MG tablet Take 40 mg by mouth daily.     solifenacin (VESICARE) 5 MG tablet Take 5 mg by mouth daily.     No current facility-administered medications for this visit.     VITALS:   Blood pressure (!) 113/53, pulse 88, temperature (!) 97.3 F (36.3 C), temperature source Temporal, resp. rate 18, height 6' 1 (1.854 m), weight 246 lb (111.6 kg), SpO2 100%.  Wt Readings from Last  3 Encounters:  01/21/24 246 lb (111.6 kg)  01/19/24 246 lb 7.6 oz (111.8 kg)  12/28/23 246 lb 9.6 oz (111.9 kg)    Body mass index is 32.46 kg/m.    Onc Performance Status - 01/21/24 0951       ECOG Perf Status   ECOG Perf Status Ambulatory and capable of all selfcare but unable to carry out any work activities.  Up and about more than 50% of waking hours      KPS SCALE   KPS % SCORE Normal activity with effort, some s/s of disease           PHYSICAL EXAM:   Physical Exam Constitutional:      General: He is not in acute distress.    Comments: Looking a lot better clinically  HENT:     Head: Normocephalic and atraumatic.     Mouth/Throat:     Comments: No evidence of thrush currently.  No trismus. Eyes:     General: No scleral icterus.    Conjunctiva/sclera: Conjunctivae normal.  Cardiovascular:     Rate and Rhythm: Normal rate and regular rhythm.     Heart sounds: Normal heart sounds.  Pulmonary:     Effort: Pulmonary effort is normal.     Breath sounds: Normal breath sounds.  Chest:     Comments: Right sided Port-A-Cath on place without any signs of infection Abdominal:     General: There is no distension.     Comments: Feeding tube in place  Musculoskeletal:     Right lower leg: No edema.     Left lower leg: No edema.  Neurological:     General: No focal deficit present.     Mental Status: He is alert and oriented to person, place, and time.      LABORATORY DATA:   I have reviewed the data as listed.  Results for orders placed or performed in visit on 01/21/24  CMP (Cancer Center only)  Result Value Ref Range   Sodium 138 135 - 145 mmol/L   Potassium 3.7 3.5 - 5.1 mmol/L   Chloride 102 98 - 111 mmol/L   CO2 32 22 - 32 mmol/L   Glucose, Bld 144 (H) 70 - 99 mg/dL   BUN 20 8 - 23 mg/dL   Creatinine 9.40 (L) 9.38 - 1.24 mg/dL   Calcium  9.3 8.9 - 10.3 mg/dL   Total Protein 6.5 6.5 - 8.1 g/dL   Albumin 3.6 3.5 - 5.0 g/dL   AST 16 15 - 41 U/L   ALT  11 0 - 44 U/L   Alkaline Phosphatase 71 38 - 126 U/L   Total Bilirubin 1.2 0.0 - 1.2 mg/dL   GFR, Estimated >39 >39 mL/min   Anion gap 4 (L) 5 - 15  CBC with Differential (Cancer Center Only)  Result Value Ref Range   WBC Count 5.9 4.0 - 10.5 K/uL   RBC 3.42 (L) 4.22 -  5.81 MIL/uL   Hemoglobin 10.9 (L) 13.0 - 17.0 g/dL   HCT 67.5 (L) 60.9 - 47.9 %   MCV 94.7 80.0 - 100.0 fL   MCH 31.9 26.0 - 34.0 pg   MCHC 33.6 30.0 - 36.0 g/dL   RDW 86.2 88.4 - 84.4 %   Platelet Count 165 150 - 400 K/uL   nRBC 0.0 0.0 - 0.2 %   Neutrophils Relative % 79 %   Neutro Abs 4.7 1.7 - 7.7 K/uL   Lymphocytes Relative 8 %   Lymphs Abs 0.5 (L) 0.7 - 4.0 K/uL   Monocytes Relative 10 %   Monocytes Absolute 0.6 0.1 - 1.0 K/uL   Eosinophils Relative 3 %   Eosinophils Absolute 0.2 0.0 - 0.5 K/uL   Basophils Relative 0 %   Basophils Absolute 0.0 0.0 - 0.1 K/uL   Immature Granulocytes 0 %   Abs Immature Granulocytes 0.02 0.00 - 0.07 K/uL  Magnesium   Result Value Ref Range   Magnesium  2.0 1.7 - 2.4 mg/dL    RADIOGRAPHIC STUDIES:  I have personally reviewed the radiological images as listed and agree with the findings in the report.  CT ABDOMEN PELVIS W CONTRAST Result Date: 01/19/2024 EXAM: CT ABDOMEN AND PELVIS WITH CONTRAST 01/19/2024 03:16:59 AM TECHNIQUE: CT of the abdomen and pelvis was performed with the administration of 100 ml of intravenous iohexol  (OMNIPAQUE ) 300 MG/ML solution and 25 ml of iohexol  (OMNIPAQUE ) 300 MG/ML solution via G-tube. Multiplanar reformatted images are provided for review. Automated exposure control, iterative reconstruction, and/or weight-based adjustment of the mA/kV was utilized to reduce the radiation dose to as low as reasonably achievable. COMPARISON: 09/24/2022 and PET/CT 11/16/2023. CLINICAL HISTORY: Acute, nonlocalized abdominal pain, LUQ abdominal pain, and emesis starting this AM. Patient is visibly uncomfortable. History of tonsillar cancer with G-tube placement (not  used since 9/8, flushed daily with bleeding from site this AM, followed by vomiting). Prior chemo and radiation treatments. WBCs 5.3, GFR > 60. Swallow function test 01/17/24. FINDINGS: LOWER CHEST: No acute abnormality. LIVER: The liver is unremarkable. GALLBLADDER AND BILE DUCTS: Gallbladder is unremarkable. No biliary ductal dilatation. SPLEEN: No acute abnormality. PANCREAS: No acute abnormality. ADRENAL GLANDS: No acute abnormality. KIDNEYS, URETERS AND BLADDER: Nonobstructing right nephrolithiasis. No stones in the left kidney or ureters. No hydronephrosis. No perinephric or periureteral stranding. Urinary bladder is unremarkable. GI AND BOWEL: Percutaneous gastrostomy tube in expected position. Contrast within the stomach. No extraluminal contrast. Additional dense contrast in the right colon and rectum. There is no bowel obstruction. PERITONEUM AND RETROPERITONEUM: No ascites. No free air. VASCULATURE: Aorta is calcified. LYMPH NODES: Misty mesenteric fat with prominent subcentimeter lymph nodes and perinodal fat sparing compatible with mesenteric panniculitis. REPRODUCTIVE ORGANS: Metallic seeds in the prostate. BONES AND SOFT TISSUES: No acute osseous abnormality. No focal soft tissue abnormality. IMPRESSION: 1. No acute abnormalities in the abdomen or pelvis. 2. Percutaneous gastrostomy tube appropriately positioned without leak. 3. Mesenteric panniculitis. 4. Nonobstructing right nephrolithiasis. Electronically signed by: Norman Gatlin MD 01/19/2024 03:37 AM EDT RP Workstation: [id number]   DG Chest Port 1 View Result Date: 01/19/2024 CLINICAL DATA:  Upper abdominal pain and hypoxia. EXAM: PORTABLE CHEST 1 VIEW COMPARISON:  Portable chest 08/14/2023 FINDINGS: Right IJ port catheter terminates at the superior cavoatrial junction, as before. There is mild cardiomegaly. No evidence of CHF. The mediastinum is normally outlined. There is calcification of the transverse aorta. Visualized lungs clear with  both CP sulci excluded from the study. Thoracic spondylosis. No new osseous  findings. Thoracic cage is intact. Overlying telemetry leads. IMPRESSION: 1. No evidence of acute chest disease. 2. Mild cardiomegaly. 3. Aortic atherosclerosis. Electronically Signed   By: Francis Quam M.D.   On: 01/19/2024 02:29   DG SWALLOW FUNC OP MEDICARE SPEECH PATH Result Date: 01/17/2024 Table formatting from the original result was not included. Modified Barium Swallow Study Patient Details Name: ATSUSHI YOM MRN: [id number] Date of Birth: 08/06/1950 Today's Date: 01/17/2024 HPI/PMH: HPI: Patient is a 73 y.o. 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 He presented to his PCP on 05/17/23 with complaints of right sided jaw pain when coughing. CT neck revealed an ulcerated enhancing mass involving the superior oropharynx and soft palate/uvula in the greatest extent that is concerning for malignancy. PET on 06/07/23 revealed a right oropharyngeal tumor. Patient reported extreme pain when swallowing that prevented him from eating or swallowing on 06/08/23. Before beginning radiation, a  PEG/PAC was done on 2/28 and an MBS was done on 06/28/23. Patient began treatment on 07/12/23 which included 35 fractions of radiation to his right Tonsil and bilateral neck with weekly chemotherapy ending on 08/27/23. Patient has been seeing OP SLP. On 01/04/24, OP SLP recommended MBS due to throat clearing during session with water  and patient reported nasal regurgitation with milk. Patient brought in for OP MBS on 01/17/24. Clinical Impression: Clinical Impression: Patient presents with a primary pharyngeal phase dysphagia and compared to MBS in March of this year, prior to starting radiation treatment, his swallow function has declined. He himself indicates that his primary nutrition has been 6 Ensure shakes per day but that most foods don't taste right and although he has a desire to eat, but  after taking a couple bites, he typically loses all interest. Sensed aspiration occured with thin liquids (PAS 7) at the beginning of the study and silent aspiration occured at the end of the study. Questionable aspiration with nectar thick liquids. Penetration to the vocal cords (PAS 5) that did not clear occured consistently with thin liquids. Pharyngeal impairments include, partial superior movement of the thyroid  cartilage, partial laryngeal elevation, incomplete laryngeal vestibule closure. Patient's epiglottis had bulbous appearance, a change from last MBS on 06/28/23. Chin tuck, left head turn, right head turn were all unsuccessful in preventing penetration/aspiration. Strategy of cough/throat clear and reswallow was the most effective strategy in helping patient clear penetration. Barium tablet became lodged at level of valleculae when swallowed with nectar thick liquid. Tablet fully transited when followed with puree. SLP recommending continue on current diet of regular/thin. Recommending to use strategy of performing a cough/throat clear, then reswallow after sips of liquids. In addition, SLP recommending patient refrain from large sips of liquids and/or consecutive sips of liquids. Encouraged patient to discuss further with OP SLP next scheduled visit. Recommendations/Plan: Swallowing Evaluation Recommendations Swallowing Evaluation Recommendations Recommendations: PO diet PO Diet Recommendation: Regular; Thin liquids (Level 0) Liquid Administration via: Cup; Straw Medication Administration: Whole meds with puree Supervision: Patient able to self-feed Swallowing strategies  : Slow rate; Small bites/sips; Hard cough after swallowing Postural changes: Position pt fully upright for meals; Stay upright 30-60 min after meals Oral care recommendations: Oral care BID (2x/day) Treatment Plan Treatment Plan Treatment recommendations: Defer treatment plan to SLP at other venue (see follow-up recommendations)  Follow-up recommendations: Outpatient SLP Functional status assessment: Patient has had a recent decline in their functional status and demonstrates the ability to make significant improvements in function in a reasonable and  predictable amount of time. Interventions: Patient/family education; Oropharyngeal exercises Recommendations Recommendations for follow up therapy are one component of a multi-disciplinary discharge planning process, led by the attending physician.  Recommendations may be updated based on patient status, additional functional criteria and insurance authorization. Assessment: Orofacial Exam: Orofacial Exam Oral Cavity: Oral Hygiene: WFL Oral Cavity - Dentition: Adequate natural dentition Orofacial Anatomy: WFL Oral Motor/Sensory Function: WFL Anatomy: Anatomy: WFL Boluses Administered: Boluses Administered Boluses Administered: Thin liquids (Level 0); Mildly thick liquids (Level 2, nectar thick); Moderately thick liquids (Level 3, honey thick); Puree; Solid  Oral Impairment Domain: Oral Impairment Domain Lip Closure: No labial escape Tongue control during bolus hold: Cohesive bolus between tongue to palatal seal Bolus preparation/mastication: Timely and efficient chewing and mashing Bolus transport/lingual motion: Brisk tongue motion Oral residue: Trace residue lining oral structures Location of oral residue : Tongue Initiation of pharyngeal swallow : Posterior angle of the ramus  Pharyngeal Impairment Domain: Pharyngeal Impairment Domain Soft palate elevation: Trace column of contrast or air between SP and PW Laryngeal elevation: Partial superior movement of thyroid  cartilage/partial approximation of arytenoids to epiglottic petiole Anterior hyoid excursion: Complete anterior movement Epiglottic movement: Complete inversion Laryngeal vestibule closure: Incomplete, narrow column air/contrast in laryngeal vestibule Pharyngeal stripping wave : Present - complete Pharyngeal contraction (A/P view  only): N/A Pharyngoesophageal segment opening: Complete distension and complete duration, no obstruction of flow Tongue base retraction: Trace column of contrast or air between tongue base and PPW Pharyngeal residue: Collection of residue within or on pharyngeal structures Location of pharyngeal residue: Valleculae  Esophageal Impairment Domain: Esophageal Impairment Domain Esophageal clearance upright position: Complete clearance, esophageal coating Pill: Pill Consistency administered: Puree; Mildly thick liquids (Level 2, nectar thick) Mildly thick liquids (Level 2, nectar thick): Impaired (see clinical impressions) Puree: WFL Penetration/Aspiration Scale Score: Penetration/Aspiration Scale Score 1.  Material does not enter airway: Puree; Solid; Moderately thick liquids (Level 3, honey thick) 8.  Material enters airway, passes BELOW cords without attempt by patient to eject out (silent aspiration) : Thin liquids (Level 0); Mildly thick liquids (Level 2, nectar thick) Compensatory Strategies: Compensatory Strategies Compensatory strategies: Yes Chin tuck: Ineffective Ineffective Chin Tuck: Thin liquid (Level 0) Left head turn: Ineffective Ineffective Left Head Turn: Thin liquid (Level 0) Right head turn: Ineffective Ineffective Right Head Turn: Thin liquid (Level 0) Other(comment): Effective (Swallow thin liquid. Cough/clear throat. Swallow again.) Effective Other(comment): Thin liquid (Level 0)   General Information: Caregiver present: Yes  Diet Prior to this Study: Regular; Thin liquids (Level 0)   Temperature : Normal   Respiratory Status: WFL   Supplemental O2: None (Room air)   History of Recent Intubation: No  Behavior/Cognition: Alert; Cooperative; Pleasant mood Self-Feeding Abilities: Able to self-feed Baseline vocal quality/speech: Normal Volitional Cough: Able to elicit Volitional Swallow: Able to elicit Exam Limitations: No limitations Goal Planning: Prognosis for improved oropharyngeal function: Fair  Barriers to Reach Goals: Overall medical prognosis Barriers/Prognosis Comment: Impact of radiation/chemotherapy Patient/Family Stated Goal: For patient to increase PO intake. Consulted and agree with results and recommendations: Patient; Family member/caregiver Pain: Pain Assessment Pain Assessment: No/denies pain End of Session: Start Time:SLP Start Time (ACUTE ONLY): 1316 Stop Time: SLP Stop Time (ACUTE ONLY): 1355 Time Calculation:SLP Time Calculation (min) (ACUTE ONLY): 39 min Charges: SLP Evaluations $ SLP Speech Visit: 1 Visit SLP Evaluations $Outpatient MBS Swallow: 1 Procedure SLP visit diagnosis: SLP Visit Diagnosis: Dysphagia, pharyngeal phase (R13.13) Past Medical History: Past Medical History: Diagnosis Date  Arthritis   Cancer (HCC)  History of partial thyroidectomy STATES OVERACTIVE THYROID -- NO ISSUES SINCE AGE 37 AND NO MEDS  History of radiation therapy   07/12/23-09/03/23 Right Tonsil  Dr. Lauraine Golden  Hyperlipidemia   Hypertension   Hypothyroidism   Sleep apnea   Per patient he tried CPAP but could not tolerate.  Thyroid  disease  Past Surgical History: Past Surgical History: Procedure Laterality Date  CIRCUMCISION  01/04/2012  Procedure: CIRCUMCISION ADULT;  Surgeon: Arlena LILLETTE Gal, MD;  Location: [facility];  Service: Urology;  Laterality: N/A;  COLONOSCOPY  04/08/2020  Vito Cirigliano  CYSTOSCOPY  09/04/2022  Procedure: CYSTOSCOPY;  Surgeon: Matilda Senior, MD;  Location: [facility];  Service: Urology;;  IR GASTROSTOMY TUBE MOD SED  06/18/2023  IR IMAGING GUIDED PORT INSERTION  06/18/2023  RADIOACTIVE SEED IMPLANT N/A 09/04/2022  Procedure: RADIOACTIVE SEED IMPLANT/BRACHYTHERAPY IMPLANT;  Surgeon: Matilda Senior, MD;  Location: [facility];  Service: Urology;  Laterality: N/A;  90 MINS  SPACE OAR INSTILLATION N/A 09/04/2022  Procedure: SPACE OAR INSTILLATION;  Surgeon: Matilda Senior, MD;  Location: [facility];   Service: Urology;  Laterality: N/A;  THYROIDECTOMY, PARTIAL  AGE 37  OVERACTIVE THYROID  Norleen IVAR Blase, MA, CCC-SLP Speech Therapy CLINICAL DATA:  Provided history: Cough, unspecified type. Dysphagia, unspecified type. EXAM: MODIFIED BARIUM SWALLOW TECHNIQUE: Different consistencies of barium were administered orally to the patient by the speech pathologist with fluoroscopic imaging of the pharynx, as well as limited imaging of the esophagus, acquired in the lateral projection. The radiologist was present in the fluoroscopy room for this study, providing personal supervision. Cine fluoroscopy/video radiography was acquired for the study. FLUOROSCOPY: Radiation Exposure Index (as provided by the fluoroscopic device): 35.50 mGy Kerma COMPARISON:  Modified barium swallow 06/28/2023. FINDINGS: Different consistencies of barium were administered orally to the patient by the speech pathologist with fluoroscopic imaging of the pharynx, as well as limited imaging of the esophagus, acquired in the lateral projection. The radiologist was present in the fluoroscopy room for this study, providing personal supervision. The speech pathologist observed aspiration with thin and nectar consistencies. Please refer to the speech pathologist's report for complete details. Fairly bulky ventral osteophytes at C5-C6 and C6-C7 (which do not significantly efface the pharynx or cervical esophagus). IMPRESSION: Modified barium swallow as described. Aspiration was observed with thin and nectar consistencies. Please refer to the speech pathologists report for complete details and recommendations. Electronically Signed   By: Rockey Childs D.O.   On: 01/17/2024 14:32    CODE STATUS:  Code Status History     Date Active Date Inactive Code Status Order ID Comments User Context   06/18/2023 1400 06/19/2023 0508 Full Code [id number]  Philip Cornet, MD HOV    Questions for Most Recent Historical Code Status (Order [id number])     Question Answer    By: Consent: discussion documented in EHR            Orders Placed This Encounter  Procedures   CT Soft Tissue Neck W Contrast    Standing Status:   Future    Expected Date:   03/20/2024    Expiration Date:   01/20/2025    If indicated for the ordered procedure, I authorize the administration of contrast media per Radiology protocol:   Yes    Does the patient have a contrast media/X-ray dye allergy?:   No    Preferred imaging location?:   [facility]   CT Chest W Contrast    Standing  Status:   Future    Expected Date:   03/20/2024    Expiration Date:   01/20/2025    If indicated for the ordered procedure, I authorize the administration of contrast media per Radiology protocol:   Yes    Does the patient have a contrast media/X-ray dye allergy?:   No    Preferred imaging location?:   [facility]   CBC with Differential (Cancer Center Only)    Standing Status:   Future    Expected Date:   03/29/2024    Expiration Date:   06/27/2024   CMP (Cancer Center only)    Standing Status:   Future    Expected Date:   03/29/2024    Expiration Date:   06/27/2024   TSH    Standing Status:   Future    Expected Date:   03/29/2024    Expiration Date:   06/27/2024   Magnesium     Standing Status:   Future    Expected Date:   03/29/2024    Expiration Date:   06/27/2024     Future Appointments  Date Time Provider Department Center  01/24/2024 12:00 PM Canary Grayce PARAS, PT [facility] None  01/28/2024  9:00 AM Ivonne Harlene RAMAN, RD [facility] None  02/08/2024  9:30 AM Jacelyn Lupita NOVAK, CCC-SLP [facility]  03/29/2024  9:00 AM [facility] FLUSH [facility] None  03/29/2024  9:30 AM Autumn Millman, MD [facility] None  04/06/2024  9:45 AM Okey Burns, MD [facility] None  07/04/2024  2:20 PM Izell Domino, MD [facility] None     This document was completed utilizing speech recognition software. Grammatical errors, random word insertions, pronoun errors, and incomplete  sentences are an occasional consequence of this system due to software limitations, ambient noise, and hardware issues. Any formal questions or concerns about the content, text or information contained within the body of this dictation should be directly addressed to the provider for clarification.

## 2024-01-21 NOTE — Assessment & Plan Note (Addendum)
 Please review oncology history for additional details and timeline of events.  Symptoms include severe pain and dysphagia since mid-November, progressively worsening.   cT3,cN2,cM0,p16+ tumor, Stage II disease.   His case was discussed in tumor conference on 06/09/2023.  Given the extent of disease, consensus opinion is to proceed with concurrent chemoradiation. We have discussed about role of cisplatin  being a radiosensitizer in the treatment of head and neck cancer.  We have discussed about the curative intent of chemoradiation for this patient.  Patient was willing to proceed with weekly cisplatin  after discussing risk versus benefits and side effect profile.  He started cycle 1 of cisplatin  at a dose of 40 mg/m on 07/09/2023.  Plan was to continue cisplatin  weekly during the course of radiation. He began radiation treatments from 07/12/2023.  During week 2, chemotherapy had to be held because of severe fatigue with ECOG performance status of 3.  He received total of 4 cycles of Cisplatin . Last cisplatin  was on 08/06/23. Stopped early because of side effects including neutropenia/ leukopenia, fatigue, nausea, weight loss.   Restaging PET scan on 11/16/2023 showed overall improvement in disease without evidence of disease progression.  His case was discussed in tumor conference earlier today and consensus opinion is that there is no evidence of disease.  Will continue surveillance as per NCCN guidelines.  Will obtain CT neck, chest in the first week of December 2025 and see him in clinic with results.  He is scheduled to see Dr. Soldatova on 04/06/2024 for ENT follow-up.

## 2024-01-22 ENCOUNTER — Encounter (HOSPITAL_COMMUNITY): Payer: Self-pay | Admitting: Internal Medicine

## 2024-01-22 ENCOUNTER — Inpatient Hospital Stay (HOSPITAL_COMMUNITY)
Admission: EM | Admit: 2024-01-22 | Discharge: 2024-01-26 | DRG: 419 | Disposition: A | Discharge status: Home / Self Care | Attending: Internal Medicine | Admitting: Internal Medicine

## 2024-01-22 ENCOUNTER — Emergency Department (HOSPITAL_COMMUNITY)

## 2024-01-22 ENCOUNTER — Inpatient Hospital Stay (HOSPITAL_COMMUNITY)

## 2024-01-22 ENCOUNTER — Other Ambulatory Visit: Payer: Self-pay

## 2024-01-22 DIAGNOSIS — Z79890 Hormone replacement therapy (postmenopausal): Secondary | ICD-10-CM | POA: Diagnosis not present

## 2024-01-22 DIAGNOSIS — N200 Calculus of kidney: Secondary | ICD-10-CM | POA: Diagnosis present

## 2024-01-22 DIAGNOSIS — F419 Anxiety disorder, unspecified: Secondary | ICD-10-CM | POA: Diagnosis present

## 2024-01-22 DIAGNOSIS — R1084 Generalized abdominal pain: Secondary | ICD-10-CM | POA: Diagnosis not present

## 2024-01-22 DIAGNOSIS — G4733 Obstructive sleep apnea (adult) (pediatric): Secondary | ICD-10-CM | POA: Diagnosis not present

## 2024-01-22 DIAGNOSIS — E119 Type 2 diabetes mellitus without complications: Secondary | ICD-10-CM

## 2024-01-22 DIAGNOSIS — C090 Malignant neoplasm of tonsillar fossa: Secondary | ICD-10-CM | POA: Diagnosis present

## 2024-01-22 DIAGNOSIS — Z8546 Personal history of malignant neoplasm of prostate: Secondary | ICD-10-CM | POA: Diagnosis not present

## 2024-01-22 DIAGNOSIS — R112 Nausea with vomiting, unspecified: Secondary | ICD-10-CM

## 2024-01-22 DIAGNOSIS — K8000 Calculus of gallbladder with acute cholecystitis without obstruction: Secondary | ICD-10-CM | POA: Diagnosis not present

## 2024-01-22 DIAGNOSIS — K219 Gastro-esophageal reflux disease without esophagitis: Secondary | ICD-10-CM | POA: Diagnosis present

## 2024-01-22 DIAGNOSIS — T451X5A Adverse effect of antineoplastic and immunosuppressive drugs, initial encounter: Secondary | ICD-10-CM | POA: Diagnosis present

## 2024-01-22 DIAGNOSIS — K8012 Calculus of gallbladder with acute and chronic cholecystitis without obstruction: Principal | ICD-10-CM | POA: Diagnosis present

## 2024-01-22 DIAGNOSIS — C61 Malignant neoplasm of prostate: Secondary | ICD-10-CM | POA: Diagnosis present

## 2024-01-22 DIAGNOSIS — N401 Enlarged prostate with lower urinary tract symptoms: Secondary | ICD-10-CM | POA: Diagnosis present

## 2024-01-22 DIAGNOSIS — K810 Acute cholecystitis: Secondary | ICD-10-CM | POA: Diagnosis present

## 2024-01-22 DIAGNOSIS — Z825 Family history of asthma and other chronic lower respiratory diseases: Secondary | ICD-10-CM

## 2024-01-22 DIAGNOSIS — D638 Anemia in other chronic diseases classified elsewhere: Secondary | ICD-10-CM | POA: Diagnosis present

## 2024-01-22 DIAGNOSIS — K8018 Calculus of gallbladder with other cholecystitis without obstruction: Secondary | ICD-10-CM | POA: Diagnosis not present

## 2024-01-22 DIAGNOSIS — K5900 Constipation, unspecified: Secondary | ICD-10-CM | POA: Diagnosis present

## 2024-01-22 DIAGNOSIS — D701 Agranulocytosis secondary to cancer chemotherapy: Secondary | ICD-10-CM | POA: Diagnosis present

## 2024-01-22 DIAGNOSIS — D649 Anemia, unspecified: Secondary | ICD-10-CM | POA: Diagnosis not present

## 2024-01-22 DIAGNOSIS — F17220 Nicotine dependence, chewing tobacco, uncomplicated: Secondary | ICD-10-CM | POA: Diagnosis present

## 2024-01-22 DIAGNOSIS — Z79899 Other long term (current) drug therapy: Secondary | ICD-10-CM | POA: Diagnosis not present

## 2024-01-22 DIAGNOSIS — K8010 Calculus of gallbladder with chronic cholecystitis without obstruction: Secondary | ICD-10-CM | POA: Diagnosis not present

## 2024-01-22 DIAGNOSIS — R932 Abnormal findings on diagnostic imaging of liver and biliary tract: Secondary | ICD-10-CM | POA: Diagnosis not present

## 2024-01-22 DIAGNOSIS — Z923 Personal history of irradiation: Secondary | ICD-10-CM

## 2024-01-22 DIAGNOSIS — I10 Essential (primary) hypertension: Secondary | ICD-10-CM | POA: Diagnosis present

## 2024-01-22 DIAGNOSIS — E890 Postprocedural hypothyroidism: Secondary | ICD-10-CM | POA: Diagnosis not present

## 2024-01-22 DIAGNOSIS — K819 Cholecystitis, unspecified: Secondary | ICD-10-CM | POA: Diagnosis not present

## 2024-01-22 DIAGNOSIS — K828 Other specified diseases of gallbladder: Secondary | ICD-10-CM | POA: Diagnosis present

## 2024-01-22 DIAGNOSIS — E1169 Type 2 diabetes mellitus with other specified complication: Secondary | ICD-10-CM | POA: Diagnosis present

## 2024-01-22 DIAGNOSIS — Z95828 Presence of other vascular implants and grafts: Secondary | ICD-10-CM | POA: Diagnosis not present

## 2024-01-22 DIAGNOSIS — R1085 Abdominal pain of multiple sites: Principal | ICD-10-CM

## 2024-01-22 DIAGNOSIS — C329 Malignant neoplasm of larynx, unspecified: Secondary | ICD-10-CM | POA: Diagnosis present

## 2024-01-22 DIAGNOSIS — Z931 Gastrostomy status: Secondary | ICD-10-CM | POA: Diagnosis not present

## 2024-01-22 DIAGNOSIS — J9811 Atelectasis: Secondary | ICD-10-CM | POA: Diagnosis not present

## 2024-01-22 DIAGNOSIS — Z8249 Family history of ischemic heart disease and other diseases of the circulatory system: Secondary | ICD-10-CM | POA: Diagnosis not present

## 2024-01-22 DIAGNOSIS — E785 Hyperlipidemia, unspecified: Secondary | ICD-10-CM | POA: Diagnosis present

## 2024-01-22 DIAGNOSIS — E66811 Obesity, class 1: Secondary | ICD-10-CM | POA: Diagnosis present

## 2024-01-22 DIAGNOSIS — R7303 Prediabetes: Secondary | ICD-10-CM | POA: Insufficient documentation

## 2024-01-22 DIAGNOSIS — I517 Cardiomegaly: Secondary | ICD-10-CM | POA: Diagnosis not present

## 2024-01-22 DIAGNOSIS — Z6832 Body mass index (BMI) 32.0-32.9, adult: Secondary | ICD-10-CM

## 2024-01-22 DIAGNOSIS — K8020 Calculus of gallbladder without cholecystitis without obstruction: Secondary | ICD-10-CM | POA: Diagnosis not present

## 2024-01-22 DIAGNOSIS — Z7982 Long term (current) use of aspirin: Secondary | ICD-10-CM

## 2024-01-22 DIAGNOSIS — R109 Unspecified abdominal pain: Secondary | ICD-10-CM | POA: Diagnosis not present

## 2024-01-22 DIAGNOSIS — N3289 Other specified disorders of bladder: Secondary | ICD-10-CM | POA: Diagnosis not present

## 2024-01-22 LAB — COMPREHENSIVE METABOLIC PANEL WITH GFR
ALT: 12 U/L (ref 0–44)
AST: 22 U/L (ref 15–41)
Albumin: 3.6 g/dL (ref 3.5–5.0)
Alkaline Phosphatase: 80 U/L (ref 38–126)
Anion gap: 10 (ref 5–15)
BUN: 20 mg/dL (ref 8–23)
CO2: 27 mmol/L (ref 22–32)
Calcium: 9.1 mg/dL (ref 8.9–10.3)
Chloride: 101 mmol/L (ref 98–111)
Creatinine, Ser: 0.48 mg/dL — ABNORMAL LOW (ref 0.61–1.24)
GFR, Estimated: >60 mL/min (ref >60)
Glucose, Bld: 144 mg/dL — ABNORMAL HIGH (ref 70–99)
Potassium: 3.6 mmol/L (ref 3.5–5.1)
Sodium: 137 mmol/L (ref 135–145)
Total Bilirubin: 1 mg/dL (ref 0.0–1.2)
Total Protein: 6.1 g/dL — ABNORMAL LOW (ref 6.5–8.1)

## 2024-01-22 LAB — URINALYSIS, ROUTINE W REFLEX MICROSCOPIC
Bilirubin Urine: NEGATIVE
Glucose, UA: NEGATIVE mg/dL
Hgb urine dipstick: NEGATIVE
Ketones, ur: NEGATIVE mg/dL
Leukocytes,Ua: NEGATIVE
Nitrite: NEGATIVE
Protein, ur: NEGATIVE mg/dL
Specific Gravity, Urine: >1.046 — ABNORMAL HIGH (ref 1.005–1.030)
pH: 6 (ref 5.0–8.0)

## 2024-01-22 LAB — CBC
HCT: 32.3 % — ABNORMAL LOW (ref 39.0–52.0)
Hemoglobin: 10.2 g/dL — ABNORMAL LOW (ref 13.0–17.0)
MCH: 30.8 pg (ref 26.0–34.0)
MCHC: 31.6 g/dL (ref 30.0–36.0)
MCV: 97.6 fL (ref 80.0–100.0)
Platelets: 150 K/uL (ref 150–400)
RBC: 3.31 MIL/uL — ABNORMAL LOW (ref 4.22–5.81)
RDW: 13.5 % (ref 11.5–15.5)
WBC: 5.8 K/uL (ref 4.0–10.5)
nRBC: 0 % (ref 0.0–0.2)

## 2024-01-22 LAB — GLUCOSE, CAPILLARY
Glucose-Capillary: 129 mg/dL — ABNORMAL HIGH (ref 70–99)
Glucose-Capillary: 136 mg/dL — ABNORMAL HIGH (ref 70–99)

## 2024-01-22 LAB — I-STAT CG4 LACTIC ACID, ED: Lactic Acid, Venous: 1 mmol/L (ref 0.5–1.9)

## 2024-01-22 LAB — LIPASE, BLOOD: Lipase: <10 U/L — ABNORMAL LOW (ref 11–51)

## 2024-01-22 MED ORDER — SODIUM CHLORIDE 0.9 % IV BOLUS
1000.0000 mL | Freq: Once | INTRAVENOUS | Status: AC
Start: 2024-01-22 — End: 2024-01-22
  Administered 2024-01-22: 1000 mL via INTRAVENOUS

## 2024-01-22 MED ORDER — ONDANSETRON HCL 4 MG PO TABS: 4.0000 mg | ORAL_TABLET | Freq: Four times a day (QID) | ORAL | Status: DC | PRN

## 2024-01-22 MED ORDER — SODIUM CHLORIDE 0.9% FLUSH: 10.0000 mL | INTRAVENOUS | Status: DC | PRN

## 2024-01-22 MED ORDER — ALPRAZOLAM 0.5 MG PO TABS
0.5000 mg | ORAL_TABLET | Freq: Two times a day (BID) | ORAL | Status: DC
  Administered 2024-01-22 – 2024-01-26 (×7): 0.5 mg via ORAL
  Filled 2024-01-22 (×8): qty 1

## 2024-01-22 MED ORDER — LEVOTHYROXINE SODIUM 88 MCG PO TABS
88.0000 ug | ORAL_TABLET | Freq: Every day | ORAL | Status: DC
  Administered 2024-01-22 – 2024-01-26 (×5): 88 ug via ORAL
  Filled 2024-01-22 (×5): qty 1

## 2024-01-22 MED ORDER — PANTOPRAZOLE SODIUM 40 MG PO TBEC
40.0000 mg | DELAYED_RELEASE_TABLET | Freq: Every day | ORAL | Status: DC
Start: 2024-01-22 — End: 2024-01-26
  Administered 2024-01-22 – 2024-01-26 (×4): 40 mg via ORAL
  Filled 2024-01-22 (×5): qty 1

## 2024-01-22 MED ORDER — PIPERACILLIN-TAZOBACTAM 3.375 G IVPB 30 MIN: 3.3750 g | Freq: Three times a day (TID) | INTRAVENOUS | Status: DC

## 2024-01-22 MED ORDER — PIPERACILLIN-TAZOBACTAM 3.375 G IVPB
3.3750 g | Freq: Three times a day (TID) | INTRAVENOUS | Status: DC
  Administered 2024-01-22 – 2024-01-25 (×8): 3.375 g via INTRAVENOUS
  Filled 2024-01-22 (×9): qty 50

## 2024-01-22 MED ORDER — SODIUM CHLORIDE 0.9 % IV SOLN: INTRAVENOUS | Status: DC

## 2024-01-22 MED ORDER — ACETAMINOPHEN 650 MG RE SUPP: 650.0000 mg | Freq: Four times a day (QID) | RECTAL | Status: DC | PRN

## 2024-01-22 MED ORDER — ATORVASTATIN CALCIUM 20 MG PO TABS
20.0000 mg | ORAL_TABLET | Freq: Every day | ORAL | Status: DC
Start: 2024-01-22 — End: 2024-01-26
  Administered 2024-01-22 – 2024-01-26 (×4): 20 mg via ORAL
  Filled 2024-01-22 (×6): qty 1

## 2024-01-22 MED ORDER — HYDROMORPHONE HCL 1 MG/ML IJ SOLN
1.0000 mg | INTRAMUSCULAR | Status: DC | PRN
  Administered 2024-01-22 – 2024-01-25 (×6): 1 mg via INTRAVENOUS
  Filled 2024-01-22 (×6): qty 1

## 2024-01-22 MED ORDER — HYDROMORPHONE HCL 1 MG/ML IJ SOLN
1.0000 mg | Freq: Once | INTRAMUSCULAR | Status: AC
  Administered 2024-01-22: 1 mg via INTRAVENOUS
  Filled 2024-01-22: qty 1

## 2024-01-22 MED ORDER — IOHEXOL 350 MG/ML SOLN
100.0000 mL | Freq: Once | INTRAVENOUS | Status: AC | PRN
  Administered 2024-01-22: 100 mL via INTRAVENOUS

## 2024-01-22 MED ORDER — DOXAZOSIN MESYLATE 1 MG PO TABS
1.0000 mg | ORAL_TABLET | Freq: Every day | ORAL | Status: DC
  Administered 2024-01-22 – 2024-01-25 (×4): 1 mg via ORAL
  Filled 2024-01-22 (×4): qty 1

## 2024-01-22 MED ORDER — ONDANSETRON HCL 4 MG/2ML IJ SOLN
4.0000 mg | Freq: Four times a day (QID) | INTRAMUSCULAR | Status: DC | PRN
  Administered 2024-01-23 – 2024-01-24 (×2): 4 mg via INTRAVENOUS
  Filled 2024-01-22 (×2): qty 2

## 2024-01-22 MED ORDER — ACETAMINOPHEN 325 MG PO TABS
650.0000 mg | ORAL_TABLET | Freq: Four times a day (QID) | ORAL | Status: DC | PRN
  Administered 2024-01-24: 650 mg via ORAL
  Filled 2024-01-22: qty 2

## 2024-01-22 MED ORDER — PIPERACILLIN-TAZOBACTAM 3.375 G IVPB 30 MIN
3.3750 g | Freq: Once | INTRAVENOUS | Status: AC
  Administered 2024-01-22: 3.375 g via INTRAVENOUS
  Filled 2024-01-22: qty 50

## 2024-01-22 MED ORDER — SODIUM CHLORIDE 0.9% FLUSH
10.0000 mL | Freq: Two times a day (BID) | INTRAVENOUS | Status: DC
  Administered 2024-01-22 – 2024-01-26 (×8): 10 mL

## 2024-01-22 MED ORDER — CHLORHEXIDINE GLUCONATE CLOTH 2 % EX PADS
6.0000 | MEDICATED_PAD | Freq: Every day | CUTANEOUS | Status: DC
  Administered 2024-01-22 – 2024-01-26 (×5): 6 via TOPICAL

## 2024-01-22 MED ORDER — OXYCODONE HCL 5 MG PO TABS: 5.0000 mg | ORAL_TABLET | ORAL | Status: DC | PRN

## 2024-01-22 NOTE — Consult Note (Signed)
 Reason for Consult:possible cholecystitis Referring Physician: Dr. Alm Celinda Ozell Isaac Carpenter is an 73 y.o. male.  HPI: This is a 73 year old gentleman with a recent history of tonsillar cancer status post radiation therapy as well as chemotherapy and PEG tube placement who has been having intermittent abdominal pain off and on for about a week.  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  He was discharged home and saw his oncologist yesterday.  Because of his ongoing discomfort in the upper abdomen which he describes as an intermittent sharp pain persisted and because of 2 episodes of emesis, he came to the emergency room today.  He underwent a CTA which was unremarkable except for a distended gallbladder with questionable wall thickening and potential stranding with pericholecystic fluid.  His PEG tube is still in place.  He has been mostly on tube feeds as he still has difficulty swallowing and limited taste so he does not really report any fatty food intolerance.  Bowel movements have been normal.  He has also had brachytherapy for prostate cancer  Past Medical History:  Diagnosis Date   Arthritis    Cancer (HCC)    History of partial thyroidectomy STATES OVERACTIVE THYROID -- NO ISSUES SINCE AGE 58 AND NO MEDS   History of radiation therapy    07/12/23-09/03/23 Right Tonsil  Dr. Lauraine Golden   Hyperlipidemia    Hypertension    Hypothyroidism    Sleep apnea    Per patient he tried CPAP but could not tolerate.   Thyroid  disease     Past Surgical History:  Procedure Laterality Date   CIRCUMCISION  01/04/2012   Procedure: CIRCUMCISION ADULT;  Surgeon: Arlena LILLETTE Gal, MD;  Location: [facility];  Service: Urology;  Laterality: N/A;   COLONOSCOPY  04/08/2020   Vito Cirigliano   CYSTOSCOPY  09/04/2022   Procedure: CYSTOSCOPY;  Surgeon: Matilda Senior, MD;  Location: [facility];  Service: Urology;;   IR  GASTROSTOMY TUBE MOD SED  06/18/2023   IR IMAGING GUIDED PORT INSERTION  06/18/2023   RADIOACTIVE SEED IMPLANT N/A 09/04/2022   Procedure: RADIOACTIVE SEED IMPLANT/BRACHYTHERAPY IMPLANT;  Surgeon: Matilda Senior, MD;  Location: [facility];  Service: Urology;  Laterality: N/A;  90 MINS   SPACE OAR INSTILLATION N/A 09/04/2022   Procedure: SPACE OAR INSTILLATION;  Surgeon: Matilda Senior, MD;  Location: [facility];  Service: Urology;  Laterality: N/A;   THYROIDECTOMY, PARTIAL  AGE 58   OVERACTIVE THYROID     Family History  Problem Relation Age of Onset   Cancer Mother    COPD Father    Heart attack Father    Cancer Sister    Cancer Brother    Cancer Sister    Cancer Brother    Colon cancer Neg Hx    Colon polyps Neg Hx    Esophageal cancer Neg Hx    Rectal cancer Neg Hx    Stomach cancer Neg Hx     Social History:  reports that he has never smoked. His smokeless tobacco use includes chew. He reports that he does not drink alcohol and does not use drugs.  Allergies:  Allergies  Allergen Reactions   Morphine      Hallucinations   Other Other (See Comments)    Tape used when putting in the port - caused welting   Testosterone Other (See Comments)    headache    Medications: I have reviewed  the patient's current medications.  Results for orders placed or performed during the hospital encounter of 01/22/24 (from the past 48 hours)  Lipase, blood     Status: Abnormal   Collection Time: 01/22/24 12:01 PM  Result Value Ref Range   Lipase <10 (L) 11 - 51 U/L    Comment: Performed at [facility], [address] [place], [place] [address]  Comprehensive metabolic panel     Status: Abnormal   Collection Time: 01/22/24 12:01 PM  Result Value Ref Range   Sodium 137 135 - 145 mmol/L   Potassium 3.6 3.5 - 5.1 mmol/L   Chloride 101 98 - 111 mmol/L   CO2 27 22 - 32 mmol/L   Glucose, Bld 144 (H) 70 - 99 mg/dL    Comment: Glucose  reference range applies only to samples taken after fasting for at least 8 hours.   BUN 20 8 - 23 mg/dL   Creatinine, Ser 9.51 (L) 0.61 - 1.24 mg/dL   Calcium  9.1 8.9 - 10.3 mg/dL   Total Protein 6.1 (L) 6.5 - 8.1 g/dL   Albumin 3.6 3.5 - 5.0 g/dL   AST 22 15 - 41 U/L   ALT 12 0 - 44 U/L   Alkaline Phosphatase 80 38 - 126 U/L   Total Bilirubin 1.0 0.0 - 1.2 mg/dL   GFR, Estimated >39 >39 mL/min    Comment: (NOTE) Calculated using the CKD-EPI Creatinine Equation (2021)    Anion gap 10 5 - 15    Comment: Performed at [facility], [address] [place], [place] [address]  CBC     Status: Abnormal   Collection Time: 01/22/24 12:01 PM  Result Value Ref Range   WBC 5.8 4.0 - 10.5 K/uL   RBC 3.31 (L) 4.22 - 5.81 MIL/uL   Hemoglobin 10.2 (L) 13.0 - 17.0 g/dL   HCT 67.6 (L) 60.9 - 47.9 %   MCV 97.6 80.0 - 100.0 fL   MCH 30.8 26.0 - 34.0 pg   MCHC 31.6 30.0 - 36.0 g/dL   RDW 86.4 88.4 - 84.4 %   Platelets 150 150 - 400 K/uL   nRBC 0.0 0.0 - 0.2 %    Comment: Performed at [facility], [address] [place], [place] [address]  Urinalysis, Routine w reflex microscopic -Urine, Clean Catch     Status: Abnormal   Collection Time: 01/22/24  1:34 PM  Result Value Ref Range   Color, Urine YELLOW YELLOW   APPearance CLEAR CLEAR   Specific Gravity, Urine >1.046 (H) 1.005 - 1.030   pH 6.0 5.0 - 8.0   Glucose, UA NEGATIVE NEGATIVE mg/dL   Hgb urine dipstick NEGATIVE NEGATIVE   Bilirubin Urine NEGATIVE NEGATIVE   Ketones, ur NEGATIVE NEGATIVE mg/dL   Protein, ur NEGATIVE NEGATIVE mg/dL   Nitrite NEGATIVE NEGATIVE   Leukocytes,Ua NEGATIVE NEGATIVE    Comment: Performed at [facility], [address], [place], [place] [address]  I-Stat CG4 Lactic Acid     Status: None   Collection Time: 01/22/24  1:47 PM  Result Value Ref Range   Lactic Acid, Venous 1.0 0.5 - 1.9 mmol/L    CT Angio Abd/Pel W and/or Wo Contrast Result Date:  01/22/2024 CLINICAL DATA:  eval for significant stenosis leading to ischemia. Recent ct abd venous showed panniculitis EXAM: CTA ABDOMEN AND PELVIS WITHOUT AND WITH CONTRAST TECHNIQUE: Multidetector CT imaging of the abdomen and pelvis was performed using the standard  protocol during bolus administration of intravenous contrast. Multiplanar reconstructed images and MIPs were obtained and reviewed to evaluate the vascular anatomy. RADIATION DOSE REDUCTION: This exam was performed according to the departmental dose-optimization program which includes automated exposure control, adjustment of the mA and/or kV according to patient size and/or use of iterative reconstruction technique. CONTRAST:  OMNIPAQUE  IOHEXOL  350 MG/ML SOLN COMPARISON:  01/19/2024 FINDINGS: VASCULAR Aorta: No aortic aneurysm or dissection. Scattered calcified aortic atherosclerosis. No hemodynamically significant stenosis. Celiac: Patent without acute thrombus, aneurysm, or dissection.Mild narrowing of the proximal celiac artery, possibly due to extrinsic compression. SMA: Patent without acute thrombus, aneurysm, or dissection.No hemodynamically significant stenosis. Renals: Patent without acute thrombus, aneurysm, or dissection.No hemodynamically significant stenosis. IMA: Patent without acute thrombus, aneurysm, or dissection.Moderate narrowing of the IMA ostium from calcified plaque. Inflow: Patent without acute thrombus, aneurysm, or dissection.No hemodynamically significant stenosis. Proximal Outflow: The bilateral common femoral and visualized portions of the superficial and profunda femoral arteries are patent without acute thrombus, aneurysm, or dissection.No hemodynamically significant stenosis. Veins: No obvious venous abnormality within the limitations of this arterial phase study. Review of the MIP images confirms the above findings. NON-VASCULAR Lower chest: No focal airspace consolidation or pleural effusion.Posterior bibasilar  dependent atelectasis. Mild cardiomegaly. Multi-vessel coronary atherosclerosis. Hepatobiliary: No mass.The gallbladder is distended and fluid-filled without radiopaque stones. Subtle pericholecystic inflammation of the fat in the porta hepatis. There is also asymmetric enhancement within the adjacent hepatic parenchyma, suggesting hyperemia.No intrahepatic or extrahepatic biliary ductal dilation. Pancreas: Diffuse fatty atrophy of the pancreatic parenchyma. No mass or ductal dilation.No peripancreatic inflammation or fluid collection. Spleen: Normal size. No mass. Adrenals/Urinary Tract: No adrenal masses. No renal mass. A couple of small nonobstructive right-sided calyceal calculi are present. No hydronephrosis. The urinary bladder is distended without focal abnormality. Stomach/Bowel:Well-positioned percutaneous gastrostomy tube in place. The stomach contains ingested material without focal abnormality. No small bowel wall thickening or inflammation. No small bowel obstruction.Normal appendix. Lymphatic: No intraabdominal or pelvic lymphadenopathy. Reproductive: No prostatomegaly. Brachytherapy seeds filling the prostate parenchyma. No free pelvic fluid. Other: Similar findings of mild mesenteric panniculitis. No pneumoperitoneum or ascites. Musculoskeletal: No acute fracture or destructive lesion.Multilevel degenerative disc disease of the spine. Thoracic DISH. IMPRESSION: VASCULAR 1. No aortic aneurysm or aortic dissection. 2. No acute thrombus within the mesenteric or renal vessels. Moderate narrowing of the IMA ostium from chunky calcified plaque. Otherwise, the celiac and superior mesenteric arteries are widely patent. NON-VASCULAR 1. Fluid-filled, distended gallbladder with pericholecystic inflammation. Asymmetric enhancement is also noted in the adjacent hepatic parenchyma, suggesting hyperemia. These findings are concerning for acute cholecystitis and a nuclear medicine hepatobiliary scan is recommended  for further characterization. 2. Couple of small nonobstructive calyceal calculi present in the right kidney. No hydronephrosis. 3. Well-positioned percutaneous gastrostomy tube. Aortic Atherosclerosis (ICD10-I70.0). Electronically Signed   By: Rogelia Myers M.D.   On: 01/22/2024 13:17    Review of Systems  Constitutional:  Negative for chills and fever.  Cardiovascular:  Negative for chest pain.  Genitourinary:  Negative for dysuria.  All other systems reviewed and are negative.  Blood pressure (!) 144/73, pulse 76, temperature (!) 97.5 F (36.4 C), temperature source Oral, resp. rate 18, height 6' 1 (1.854 m), weight 112 kg, SpO2 98%. Physical Exam Constitutional:      Appearance: He is obese. He is not ill-appearing.  HENT:     Head: Normocephalic and atraumatic.  Cardiovascular:     Rate and Rhythm: Normal rate and regular rhythm.  Pulmonary:  Effort: Pulmonary effort is normal. No respiratory distress.  Abdominal:     Comments: Abdomen is obese.  There is a PEG tube in the left side of the abdomen.  There is some tenderness near the epigastrium and right upper quadrant to deep palpation.  The rest of the abdomen is soft and nontender.  Skin:    General: Skin is warm and dry.  Neurological:     General: No focal deficit present.     Mental Status: He is alert.  Psychiatric:        Mood and Affect: Mood is not anxious.        Behavior: Behavior normal.     Assessment/Plan: Abdominal pain with possible acalculous cholecystitis  I have reviewed his CT scan of the abdomen pelvis.  I believe the findings are very subtle regarding cholecystitis and recommend an ultrasound of his abdomen which has been ordered and I will also order a HIDA scan.  I discussed the diagnosis with the patient and his wife.  We would absolutely want to try to confirm whether there are gallstones or cholecystitis prior to considering surgery as it could be slightly difficult given the peg tube in  place and intubation could be difficult given his recent radiation to the tonsillar region.  The patient and his wife understand and agree with the plans.  Complex medical decision making  Isaac Carpenter 01/22/2024, 2:16 PM

## 2024-01-22 NOTE — Plan of Care (Signed)

## 2024-01-22 NOTE — Progress Notes (Signed)
   01/22/24 2300  BiPAP/CPAP/SIPAP  Reason BIPAP/CPAP not in use Non-compliant (PLACED PATIENT ON CPAP WITH NASAL MASK BECAUSE HE DID NOT WANT FULL FACE MASK, AND HE STILL REFUSED TO WEAR.)

## 2024-01-22 NOTE — H&P (Signed)
 History and Physical    Patient: Isaac Carpenter FMW:[id number] DOB: 1950/06/27 DOA: 01/22/2024 DOS: the patient was seen and examined on 01/22/2024 PCP: [name], [name], MD  Patient coming from: Home  Chief Complaint:  Chief Complaint  Patient presents with   Abdominal Pain   HPI: Isaac Carpenter is a 73 y.o. male with medical history significant of type 2 diabetes, anxiety, class I obesity, osteoarthritis, BPH, prostate cancer, tonsillar fossa cancer, history of PEG placement, chemotherapy-induced neutropenia, febrile neutropenia who was recently seen in the hospital due to abdominal pain 3 days ago for what was thought to be mesenteric panniculitis and is now returning due to persistent pain despite his constipation having resolved after he took magnesium  citrate.  No emesis, diarrhea, melena or hematochezia.  No flank pain, dysuria, frequency or hematuria.  His appetite is decreased. He denied fever, chills, rhinorrhea, sore throat, wheezing or hemoptysis.  No chest pain, palpitations, diaphoresis, PND, orthopnea or pitting edema of the lower extremities.  No polyuria, polydipsia, polyphagia or blurred vision.   Labwork: Urinalysis shows specific gravity greater than 1.046, but was otherwise normal.  CBC showed a white count of 5.8, hemoglobin 10.2 g/dL and platelets 849.  CMP showed normal electrolytes, normal LFTs with the exception of total protein of 6.1 g/dL, glucose 855, BUN 20 and creatinine 0.48 mg/dL.  Lactic acid was normal.  Imaging: CTA abdomen/pelvis showing a fluid-filled, distended gallbladder with Abran cholecystic inflammation.  There is a symmetric enhancement of the adjacent hepatic parenchyma, suggesting hyperemia.  Findings concerning for acute cholecystitis.  HIDA scan recommended.  Nonobstructive calyceal calculi in the right kidney.  Posterior bibasilar dependent atelectasis.  Mild cardiomegaly.  Multivessel coronary and aortic arthrosclerosis.  Well-positioned PEG.   ED  course: Initial vital signs were temperature 98.7 F, pulse 87, respiration 18, BP 105/78 mmHg O2 sat 97% on room air.  The patient received hydromorphone 1 mg IVP, NS 1000 mL liter bolus and Zosyn  3.375 g IVPB x 1.  Review of Systems: As mentioned in the history of present illness. All other systems reviewed and are negative. Past Medical History:  Diagnosis Date   Arthritis    Cancer (HCC)    History of partial thyroidectomy STATES OVERACTIVE THYROID -- NO ISSUES SINCE AGE 63 AND NO MEDS   History of radiation therapy    07/12/23-09/03/23 Right Tonsil  Dr. Lauraine Golden   Hyperlipidemia    Hypertension    Hypothyroidism    Sleep apnea    Per patient he tried CPAP but could not tolerate.   Thyroid  disease    Past Surgical History:  Procedure Laterality Date   CIRCUMCISION  01/04/2012   Procedure: CIRCUMCISION ADULT;  Surgeon: Arlena LILLETTE Gal, MD;  Location[facility];  Service: Urology;  Laterality: N/A;   COLONOSCOPY  04/08/2020   Vito Cirigliano   CYSTOSCOPY  09/04/2022   Procedure: CYSTOSCOPY;  Surgeon: Matilda Senior, MD;  Location: [facility];  Service: Urology;;   IR GASTROSTOMY TUBE MOD SED  06/18/2023   IR IMAGING GUIDED PORT INSERTION  06/18/2023   RADIOACTIVE SEED IMPLANT N/A 09/04/2022   Procedure: RADIOACTIVE SEED IMPLANT/BRACHYTHERAPY IMPLANT;  Surgeon: Matilda Senior, MD;  Location: [facility];  Service: Urology;  Laterality: N/A;  90 MINS   SPACE OAR INSTILLATION N/A 09/04/2022   Procedure: SPACE OAR INSTILLATION;  Surgeon: Matilda Senior, MD;  Location: [facility];  Service: Urology;  Laterality: N/A;   THYROIDECTOMY, PARTIAL  AGE 63  OVERACTIVE THYROID    Social History:  reports that he has never smoked. His smokeless tobacco use includes chew. He reports that he does not drink alcohol and does not use drugs.  Allergies  Allergen Reactions   Morphine      Hallucinations   Other Other  (See Comments)    Tape used when putting in the port - caused welting   Testosterone Other (See Comments)    headache    Family History  Problem Relation Age of Onset   Cancer Mother    COPD Father    Heart attack Father    Cancer Sister    Cancer Brother    Cancer Sister    Cancer Brother    Colon cancer Neg Hx    Colon polyps Neg Hx    Esophageal cancer Neg Hx    Rectal cancer Neg Hx    Stomach cancer Neg Hx     Prior to Admission medications   Medication Sig Start Date End Date Taking? Authorizing Provider  ALPRAZolam  (XANAX ) 1 MG tablet TAKE ONE-HALF TABLET BY MOUTH TWO TIMES DAILY AS NEEDED FOR ANXIETY Patient taking differently: Take 0.5 mg by mouth 2 (two) times daily. 11/16/23   Burton, Lacie K, NP  aspirin 81 MG chewable tablet Chew 81 mg by mouth daily.    [provider]  atorvastatin  (LIPITOR) 20 MG tablet Take 20 mg by mouth daily.    [provider]  doxazosin  (CARDURA ) 1 MG tablet TAKE ONE TABLET BY MOUTH AT BEDTIME 12/01/23   Pasam, Avinash, MD  levothyroxine  (SYNTHROID ) 88 MCG tablet TAKE ONE TABLET BY MOUTH DAILY. 07/12/23   [name], [name], MD  pantoprazole  (PROTONIX ) 40 MG tablet Take 40 mg by mouth daily.    [provider]  solifenacin (VESICARE) 5 MG tablet Take 5 mg by mouth daily. 10/12/22   [provider]    Physical Exam: Vitals:   01/22/24 1018 01/22/24 1324  BP: 105/78 (!) 144/73  Pulse: 87 76  Resp: 18 18  Temp: 98.7 F (37.1 C) (!) 97.5 F (36.4 C)  TempSrc: Oral Oral  SpO2: 97% 98%  Weight: 112 kg   Height: 6' 1 (1.854 m)    Physical Exam Vitals and nursing note reviewed.  Constitutional:      General: He is awake. He is not in acute distress.    Appearance: He is obese. He is ill-appearing.  HENT:     Head: Normocephalic.     Nose: No rhinorrhea.     Mouth/Throat:     Mouth: Mucous membranes are dry.  Eyes:     General: No scleral icterus.    Pupils: Pupils are equal, round, and reactive to  light.  Neck:     Vascular: No JVD.  Cardiovascular:     Rate and Rhythm: Normal rate and regular rhythm.     Heart sounds: S1 normal and S2 normal.  Pulmonary:     Effort: Pulmonary effort is normal.     Breath sounds: Normal breath sounds. No wheezing, rhonchi or rales.  Abdominal:     General: Abdomen is protuberant. The ostomy site is clean. Bowel sounds are normal. There is no distension.     Palpations: Abdomen is soft.     Tenderness: There is no abdominal tenderness. There is no right CVA tenderness or left CVA tenderness.  Musculoskeletal:     Cervical back: Neck supple.     Right lower leg: No edema.     Left  lower leg: No edema.  Skin:    General: Skin is warm and dry.  Neurological:     General: No focal deficit present.     Mental Status: He is alert and oriented to person, place, and time.  Psychiatric:        Mood and Affect: Mood normal. Mood is not anxious or depressed.        Behavior: Behavior normal. Behavior is cooperative.     Data Reviewed:  Results are pending, will review when available.  EKG: Vent. rate 75 BPM PR interval 170 ms QRS duration 88 ms QT/QTcB 405/453 ms P-R-T axes 72 71 61 Sinus rhythm Low voltage, extremity leads  Assessment and Plan: Principal Problem:   Acute cholecystitis Admit to MedSurg/inpatient. Continue IV fluids. Keep n.p.o. for now. Analgesics as needed. Antiemetics as needed. Continue Zosyn  3.375 g IVPB every 8 hours. Follow CBC, CMP in AM. General surgery input appreciated.  Active Problems:   Normocytic anemia Monitor hematocrit and hemoglobin.    S/P percutaneous endoscopic gastrostomy    (PEG) tube placement (HCC) Scheduled for removal. If needing cholecystectomy, patient wants that removed as well.    Hypothyroidism, postsurgical Continue levothyroxine  88 mcg p.o. daily.    Hypertension, essential, benign Continue Cardura  1 mg p.o. bedtime. Monitor blood pressure closely.     Hyperlipidemia Continue atorvastatin  20 mg p.o. daily.    OSA (obstructive sleep apnea) May use CPAP at bedtime.    Type 2 diabetes mellitus (HCC) Currently NPO. CBG monitoring every 6 hours.    Advance Care Planning:   Code Status: Full Code   Consults: [facility].  Family Communication: His spouse was at bedside.  Severity of Illness: The appropriate patient status for this patient is INPATIENT. Inpatient status is judged to be reasonable and necessary in order to provide the required intensity of service to ensure the patient's safety. The patient's presenting symptoms, physical exam findings, and initial radiographic and laboratory data in the context of their chronic comorbidities is felt to place them at high risk for further clinical deterioration. Furthermore, it is not anticipated that the patient will be medically stable for discharge from the hospital within 2 midnights of admission.   * I certify that at the point of admission it is my clinical judgment that the patient will require inpatient hospital care spanning beyond 2 midnights from the point of admission due to high intensity of service, high risk for further deterioration and high frequency of surveillance required.*  Author: Alm Dorn Castor, MD 01/22/2024 1:54 PM  For on call review [email or web address].   This document was prepared using Dragon voice recognition software and may contain some unintended transcription errors.

## 2024-01-22 NOTE — ED Provider Notes (Signed)
 [place] EMERGENCY DEPARTMENT AT [facility] Provider Note   CSN: [id number] Arrival date & time: 01/22/24  1014     Patient presents with: Abdominal Pain   Isaac Carpenter is a 73 y.o. male.    Abdominal Pain   Patient presents due to abdominal pain.  Patient states that he went home Saturday after being discharged home here and started having worsening abdominal pain starting last night and into this morning.  Last bowel movement was yesterday.  Has since been having decreased flatulence.  Had episodes of emesis x 2 today.  Endorses generalized abdominal pain.  No obvious significant postprandial pain.  No hemoptysis no chest pain.  No shortness of breath.  Just generalized abdominal pain.   Previous medical history reviewed : medical history significant for tonsillar cancer, status post chemoradiation, dysphagia and oral candidiasis following chemoradiation, status post PEG tube placement (initially placed in February 2025, replaced in May 2025, after accidentally pulling it out), radiation-induced lymphedema of the neck, BPH and urinary retention, hypothyroidism, anxiety disorder, GERD.  This admission, questionable mesenteric panniculitis.  Patient symptoms improved and subsequent was discharged home.     Prior to Admission medications   Medication Sig Start Date End Date Taking? Authorizing Provider  ALPRAZolam  (XANAX ) 1 MG tablet TAKE ONE-HALF TABLET BY MOUTH TWO TIMES DAILY AS NEEDED FOR ANXIETY Patient taking differently: Take 0.5 mg by mouth 2 (two) times daily. 11/16/23   Burton, Lacie K, NP  aspirin 81 MG chewable tablet Chew 81 mg by mouth daily.    [provider]  atorvastatin  (LIPITOR) 20 MG tablet Take 20 mg by mouth daily.    [provider]  doxazosin  (CARDURA ) 1 MG tablet TAKE ONE TABLET BY MOUTH AT BEDTIME 12/01/23   Pasam, Avinash, MD  levothyroxine  (SYNTHROID ) 88 MCG tablet TAKE ONE TABLET BY MOUTH DAILY. 07/12/23   [name], [name], MD   pantoprazole  (PROTONIX ) 40 MG tablet Take 40 mg by mouth daily.    [provider]  solifenacin (VESICARE) 5 MG tablet Take 5 mg by mouth daily. 10/12/22   [provider]    Allergies: Morphine , Other, and Testosterone    Review of Systems  Gastrointestinal:  Positive for abdominal pain.    Updated Vital Signs BP 131/60 (BP Location: Left Arm)   Pulse 69   Temp 97.7 F (36.5 C) (Oral)   Resp 14   Ht 6' 1 (1.854 m)   Wt 112 kg   SpO2 99%   BMI 32.59 kg/m   Physical Exam Vitals and nursing note reviewed.  Constitutional:      General: He is not in acute distress.    Appearance: He is well-developed.  HENT:     Head: Normocephalic and atraumatic.  Eyes:     Conjunctiva/sclera: Conjunctivae normal.  Cardiovascular:     Rate and Rhythm: Normal rate and regular rhythm.     Heart sounds: No murmur heard. Pulmonary:     Effort: Pulmonary effort is normal. No respiratory distress.     Breath sounds: Normal breath sounds.  Abdominal:     Palpations: Abdomen is soft.     Tenderness: There is no abdominal tenderness.     Comments: Generalized pain to palpation   Musculoskeletal:        General: No swelling.     Cervical back: Neck supple.  Skin:    General: Skin is warm and dry.     Capillary Refill: Capillary refill takes less than 2  seconds.  Neurological:     Mental Status: He is alert.  Psychiatric:        Mood and Affect: Mood normal.     (all labs ordered are listed, but only abnormal results are displayed) Labs Reviewed  LIPASE, BLOOD - Abnormal; Notable for the following components:      Result Value   Lipase <10 (*)    All other components within normal limits  COMPREHENSIVE METABOLIC PANEL WITH GFR - Abnormal; Notable for the following components:   Glucose, Bld 144 (*)    Creatinine, Ser 0.48 (*)    Total Protein 6.1 (*)    All other components within normal limits  CBC - Abnormal; Notable for the following components:   RBC 3.31  (*)    Hemoglobin 10.2 (*)    HCT 32.3 (*)    All other components within normal limits  URINALYSIS, ROUTINE W REFLEX MICROSCOPIC - Abnormal; Notable for the following components:   Specific Gravity, Urine >1.046 (*)    All other components within normal limits  I-STAT CG4 LACTIC ACID, ED  I-STAT CG4 LACTIC ACID, ED    EKG: EKG Interpretation Date/Time:  Saturday January 22 2024 11:54:56 EDT Ventricular Rate:  75 PR Interval:  170 QRS Duration:  88 QT Interval:  405 QTC Calculation: 453 R Axis:   71  Text Interpretation: Sinus rhythm Low voltage, extremity leads Confirmed by Simon Rea [phone]) on 01/22/2024 12:56:28 PM  Radiology: CT Angio Abd/Pel W and/or Wo Contrast Result Date: 01/22/2024 CLINICAL DATA:  eval for significant stenosis leading to ischemia. Recent ct abd venous showed panniculitis EXAM: CTA ABDOMEN AND PELVIS WITHOUT AND WITH CONTRAST TECHNIQUE: Multidetector CT imaging of the abdomen and pelvis was performed using the standard protocol during bolus administration of intravenous contrast. Multiplanar reconstructed images and MIPs were obtained and reviewed to evaluate the vascular anatomy. RADIATION DOSE REDUCTION: This exam was performed according to the departmental dose-optimization program which includes automated exposure control, adjustment of the mA and/or kV according to patient size and/or use of iterative reconstruction technique. CONTRAST:  OMNIPAQUE  IOHEXOL  350 MG/ML SOLN COMPARISON:  01/19/2024 FINDINGS: VASCULAR Aorta: No aortic aneurysm or dissection. Scattered calcified aortic atherosclerosis. No hemodynamically significant stenosis. Celiac: Patent without acute thrombus, aneurysm, or dissection.Mild narrowing of the proximal celiac artery, possibly due to extrinsic compression. SMA: Patent without acute thrombus, aneurysm, or dissection.No hemodynamically significant stenosis. Renals: Patent without acute thrombus, aneurysm, or dissection.No  hemodynamically significant stenosis. IMA: Patent without acute thrombus, aneurysm, or dissection.Moderate narrowing of the IMA ostium from calcified plaque. Inflow: Patent without acute thrombus, aneurysm, or dissection.No hemodynamically significant stenosis. Proximal Outflow: The bilateral common femoral and visualized portions of the superficial and profunda femoral arteries are patent without acute thrombus, aneurysm, or dissection.No hemodynamically significant stenosis. Veins: No obvious venous abnormality within the limitations of this arterial phase study. Review of the MIP images confirms the above findings. NON-VASCULAR Lower chest: No focal airspace consolidation or pleural effusion.Posterior bibasilar dependent atelectasis. Mild cardiomegaly. Multi-vessel coronary atherosclerosis. Hepatobiliary: No mass.The gallbladder is distended and fluid-filled without radiopaque stones. Subtle pericholecystic inflammation of the fat in the porta hepatis. There is also asymmetric enhancement within the adjacent hepatic parenchyma, suggesting hyperemia.No intrahepatic or extrahepatic biliary ductal dilation. Pancreas: Diffuse fatty atrophy of the pancreatic parenchyma. No mass or ductal dilation.No peripancreatic inflammation or fluid collection. Spleen: Normal size. No mass. Adrenals/Urinary Tract: No adrenal masses. No renal mass. A couple of small nonobstructive right-sided calyceal calculi are present. No hydronephrosis.  The urinary bladder is distended without focal abnormality. Stomach/Bowel:Well-positioned percutaneous gastrostomy tube in place. The stomach contains ingested material without focal abnormality. No small bowel wall thickening or inflammation. No small bowel obstruction.Normal appendix. Lymphatic: No intraabdominal or pelvic lymphadenopathy. Reproductive: No prostatomegaly. Brachytherapy seeds filling the prostate parenchyma. No free pelvic fluid. Other: Similar findings of mild mesenteric  panniculitis. No pneumoperitoneum or ascites. Musculoskeletal: No acute fracture or destructive lesion.Multilevel degenerative disc disease of the spine. Thoracic DISH. IMPRESSION: VASCULAR 1. No aortic aneurysm or aortic dissection. 2. No acute thrombus within the mesenteric or renal vessels. Moderate narrowing of the IMA ostium from chunky calcified plaque. Otherwise, the celiac and superior mesenteric arteries are widely patent. NON-VASCULAR 1. Fluid-filled, distended gallbladder with pericholecystic inflammation. Asymmetric enhancement is also noted in the adjacent hepatic parenchyma, suggesting hyperemia. These findings are concerning for acute cholecystitis and a nuclear medicine hepatobiliary scan is recommended for further characterization. 2. Couple of small nonobstructive calyceal calculi present in the right kidney. No hydronephrosis. 3. Well-positioned percutaneous gastrostomy tube. Aortic Atherosclerosis (ICD10-I70.0). Electronically Signed   By: Rogelia Myers M.D.   On: 01/22/2024 13:17     Procedures   Medications Ordered in the ED  acetaminophen  (TYLENOL ) tablet 650 mg (has no administration in time range)    Or  acetaminophen  (TYLENOL ) suppository 650 mg (has no administration in time range)  ondansetron  (ZOFRAN ) tablet 4 mg (has no administration in time range)    Or  ondansetron  (ZOFRAN ) injection 4 mg (has no administration in time range)  HYDROmorphone (DILAUDID) injection 1 mg (has no administration in time range)  piperacillin -tazobactam (ZOSYN ) IVPB 3.375 g (has no administration in time range)  sodium chloride  flush (NS) 0.9 % injection 10-40 mL (10 mLs Intracatheter Given 01/22/24 1454)  sodium chloride  flush (NS) 0.9 % injection 10-40 mL (has no administration in time range)  Chlorhexidine  Gluconate Cloth 2 % PADS 6 each (6 each Topical Given 01/22/24 1454)  sodium chloride  0.9 % bolus 1,000 mL (1,000 mLs Intravenous New Bag/Given 01/22/24 1202)  HYDROmorphone (DILAUDID)  injection 1 mg (1 mg Intravenous Given 01/22/24 1159)  iohexol  (OMNIPAQUE ) 350 MG/ML injection 100 mL (100 mLs Intravenous Contrast Given 01/22/24 1228)  piperacillin -tazobactam (ZOSYN ) IVPB 3.375 g (3.375 g Intravenous New Bag/Given 01/22/24 1354)                                    Medical Decision Making Amount and/or Complexity of Data Reviewed Labs: ordered. Radiology: ordered.  Risk Prescription drug management. Decision regarding hospitalization.      Patient presents due to abdominal pain.  Patient states that he went home Saturday after being discharged home here and started having worsening abdominal pain starting last night and into this morning.  Last bowel movement was yesterday.  Has since been having decreased flatulence.  Had episodes of emesis x 2 today.  Endorses generalized abdominal pain.  No obvious significant postprandial pain.  No hemoptysis no chest pain.  No shortness of breath.  Just generalized abdominal pain.   Previous medical history reviewed : medical history significant for tonsillar cancer, status post chemoradiation, dysphagia and oral candidiasis following chemoradiation, status post PEG tube placement (initially placed in February 2025, replaced in May 2025, after accidentally pulling it out), radiation-induced lymphedema of the neck, BPH and urinary reten    Exam, patient in stable.  ANO x 3 GCS 15.  Maps appropriate.  No tachycardia or tachypnea.  Patient had generalized pain to palpation.   Reviewed patient's prior imaging.  Showed mesenteric panniculitis.  Given unclear etiology of this, did repeat CT imaging but obtain CTA of the abdomen and make sure there is no obvious ischemic disease process going on.  Seem like he has low risk for any kind of ischemic process given no significant postprandial pain but patient does have certain risk factors that would preclude him to this.  Given this, did proceed with a CTA of the abdomen.  CTA of the  abdomen showed no significant high risk stenosis.  Supported by no anion gap that be concerning for lactic acidosis.  No leukocytosis or left shift.  Interestingly, CT scan did show possible changes associated with acute cholecystitis.  Recommended HIDA scan.  Consulted surgery Dr. Vernetta.  They recommended medicine admission with ongoing IV antibiotics and they will see the patient for further care.  I have ordered ultrasound to further evaluate the gallbladder this point time.  Admitted for further care.        Final diagnoses:  Abdominal pain of multiple sites  Nausea and vomiting, unspecified vomiting type    ED Discharge Orders     None          Simon Lavonia SAILOR, MD 01/22/24 1501

## 2024-01-22 NOTE — ED Triage Notes (Signed)
 Patient to ED by POV with c/o ABD pain states he was admitted Tuesday for Mesenteric panniculitis. Per his wife he went home and pain never really improved she gave him Mag Citrate to help with BM which helped but not with pain, he has had decreases in appetite.

## 2024-01-23 ENCOUNTER — Encounter (HOSPITAL_COMMUNITY): Payer: Self-pay | Admitting: Emergency Medicine

## 2024-01-23 ENCOUNTER — Inpatient Hospital Stay (HOSPITAL_COMMUNITY)

## 2024-01-23 DIAGNOSIS — K819 Cholecystitis, unspecified: Secondary | ICD-10-CM | POA: Diagnosis not present

## 2024-01-23 DIAGNOSIS — R932 Abnormal findings on diagnostic imaging of liver and biliary tract: Secondary | ICD-10-CM | POA: Diagnosis not present

## 2024-01-23 DIAGNOSIS — Z95828 Presence of other vascular implants and grafts: Secondary | ICD-10-CM | POA: Diagnosis not present

## 2024-01-23 DIAGNOSIS — K810 Acute cholecystitis: Secondary | ICD-10-CM | POA: Diagnosis not present

## 2024-01-23 DIAGNOSIS — I517 Cardiomegaly: Secondary | ICD-10-CM | POA: Diagnosis not present

## 2024-01-23 LAB — CBC
HCT: 31 % — ABNORMAL LOW (ref 39.0–52.0)
Hemoglobin: 9.8 g/dL — ABNORMAL LOW (ref 13.0–17.0)
MCH: 31.1 pg (ref 26.0–34.0)
MCHC: 31.6 g/dL (ref 30.0–36.0)
MCV: 98.4 fL (ref 80.0–100.0)
Platelets: 138 K/uL — ABNORMAL LOW (ref 150–400)
RBC: 3.15 MIL/uL — ABNORMAL LOW (ref 4.22–5.81)
RDW: 13.4 % (ref 11.5–15.5)
WBC: 6.5 K/uL (ref 4.0–10.5)
nRBC: 0 % (ref 0.0–0.2)

## 2024-01-23 LAB — COMPREHENSIVE METABOLIC PANEL WITH GFR
ALT: 16 U/L (ref 0–44)
AST: 30 U/L (ref 15–41)
Albumin: 3.3 g/dL — ABNORMAL LOW (ref 3.5–5.0)
Alkaline Phosphatase: 105 U/L (ref 38–126)
Anion gap: 10 (ref 5–15)
BUN: 15 mg/dL (ref 8–23)
CO2: 26 mmol/L (ref 22–32)
Calcium: 8.8 mg/dL — ABNORMAL LOW (ref 8.9–10.3)
Chloride: 101 mmol/L (ref 98–111)
Creatinine, Ser: 0.44 mg/dL — ABNORMAL LOW (ref 0.61–1.24)
GFR, Estimated: >60 mL/min (ref >60)
Glucose, Bld: 142 mg/dL — ABNORMAL HIGH (ref 70–99)
Potassium: 3.7 mmol/L (ref 3.5–5.1)
Sodium: 137 mmol/L (ref 135–145)
Total Bilirubin: 1 mg/dL (ref 0.0–1.2)
Total Protein: 5.7 g/dL — ABNORMAL LOW (ref 6.5–8.1)

## 2024-01-23 LAB — GLUCOSE, CAPILLARY
Glucose-Capillary: 149 mg/dL — ABNORMAL HIGH (ref 70–99)
Glucose-Capillary: 131 mg/dL — ABNORMAL HIGH (ref 70–99)
Glucose-Capillary: 117 mg/dL — ABNORMAL HIGH (ref 70–99)

## 2024-01-23 MED ORDER — HYDRALAZINE HCL 20 MG/ML IJ SOLN: 5.0000 mg | INTRAMUSCULAR | Status: DC | PRN

## 2024-01-23 MED ORDER — TECHNETIUM TC 99M MEBROFENIN IV KIT
5.1900 | PACK | Freq: Once | INTRAVENOUS | Status: AC | PRN
  Administered 2024-01-23: 5.19 via INTRAVENOUS

## 2024-01-23 NOTE — Progress Notes (Addendum)
 Patient ID: Isaac Carpenter, male   DOB: 1951-03-06, 73 y.o.   MRN: [id number]   Acute Care Surgery Service Progress Note:    Chief Complaint/Subjective: Feels better No n/v Not much abd pain  Objective: Vital signs in last 24 hours: Temp:  [97.5 F (36.4 C)-100 F (37.8 C)] 97.9 F (36.6 C) (10/05 0507) Pulse Rate:  [69-99] 88 (10/05 0507) Resp:  [14-18] 15 (10/05 0507) BP: (105-144)/(55-78) 135/57 (10/05 0507) SpO2:  [87 %-100 %] 100 % (10/05 0507) Weight:  [887 kg] 112 kg (10/04 1018) Last BM Date : 01/21/24  Intake/Output from previous day: 10/04 0701 - 10/05 0700 In: 3393 [I.V.:287.8; IV Piggyback:3105.2] Out: -  Intake/Output this shift: No intake/output data recorded.  Lungs:  nonlabored  Cardiovascular: reg  Abd: obese, soft, min TTP, PEG intact  Extremities: no edema, +SCDs  Neuro: alert, nonfocal  Lab Results: CBC  Recent Labs    01/22/24 1201 01/23/24 0253  WBC 5.8 6.5  HGB 10.2* 9.8*  HCT 32.3* 31.0*  PLT 150 138*   BMET Recent Labs    01/22/24 1201 01/23/24 0253  NA 137 137  K 3.6 3.7  CL 101 101  CO2 27 26  GLUCOSE 144* 142*  BUN 20 15  CREATININE 0.48* 0.44*  CALCIUM  9.1 8.8*   LFT    Latest Ref Rng & Units 01/23/2024    2:53 AM 01/22/2024   12:01 PM 01/21/2024    8:21 AM  Hepatic Function  Total Protein 6.5 - 8.1 g/dL 5.7  6.1  6.5   Albumin 3.5 - 5.0 g/dL 3.3  3.6  3.6   AST 15 - 41 U/L 30  22  16    ALT 0 - 44 U/L 16  12  11    Alk Phosphatase 38 - 126 U/L 105  80  71   Total Bilirubin 0.0 - 1.2 mg/dL 1.0  1.0  1.2    PT/INR No results for input(s): LABPROT, INR in the last 72 hours. ABG No results for input(s): PHART, HCO3 in the last 72 hours.  Invalid input(s): PCO2, PO2  Studies/Results:  Anti-infectives: Anti-infectives (From admission, onward)    Start     Dose/Rate Route Frequency Ordered Stop   01/22/24 2200  piperacillin -tazobactam (ZOSYN ) IVPB 3.375 g  Status:  Discontinued        3.375 g 100  mL/hr over 30 Minutes Intravenous Every 8 hours 01/22/24 1406 01/22/24 1408   01/22/24 2200  piperacillin -tazobactam (ZOSYN ) IVPB 3.375 g        3.375 g 12.5 mL/hr over 240 Minutes Intravenous Every 8 hours 01/22/24 1408     01/22/24 1330  piperacillin -tazobactam (ZOSYN ) IVPB 3.375 g        3.375 g 100 mL/hr over 30 Minutes Intravenous  Once 01/22/24 1321 01/22/24 1424       Medications: Scheduled Meds:  ALPRAZolam   0.5 mg Oral BID   atorvastatin   20 mg Oral Daily   Chlorhexidine  Gluconate Cloth  6 each Topical Daily   doxazosin   1 mg Oral QHS   levothyroxine   88 mcg Oral Daily   pantoprazole   40 mg Oral Daily   sodium chloride  flush  10-40 mL Intracatheter Q12H   Continuous Infusions:  sodium chloride  125 mL/hr at 01/23/24 0815   piperacillin -tazobactam (ZOSYN )  IV 3.375 g (01/23/24 0626)   PRN Meds:.acetaminophen  **OR** acetaminophen , HYDROmorphone (DILAUDID) injection, ondansetron  **OR** ondansetron  (ZOFRAN ) IV, oxyCODONE , sodium chloride  flush  Assessment/Plan: Patient Active Problem List   Diagnosis Date Noted  Acute cholecystitis 01/22/2024   Prediabetes 01/22/2024   Type 2 diabetes mellitus (HCC) 01/22/2024   Abdominal pain 01/19/2024   Cancer (HCC) 08/14/2023   Febrile neutropenia 08/14/2023   S/P percutaneous endoscopic gastrostomy (PEG) tube placement (HCC) 08/14/2023   Hyponatremia 08/14/2023   Normocytic anemia 08/14/2023   Chemotherapy induced neutropenia 07/30/2023   Fatigue 07/16/2023   Situational anxiety 07/09/2023   Encounter for antineoplastic chemotherapy 07/09/2023   Dysphagia 07/09/2023   Port-A-Cath in place 07/05/2023   Sensorineural hearing loss (SNHL) of right ear 06/23/2023   Cancer associated pain 06/11/2023   Coordination of complex care 06/11/2023   Malignant neoplasm of tonsillar fossa (HCC) 06/08/2023   Malignant neoplasm of prostate (HCC) 07/17/2022   Hyperlipidemia 05/11/2021   Morbid obesity (HCC)-BMI 37.8 with DM II,HLD,OSA,HTN  05/09/2021   Hypertension, essential, benign 07/23/2020   OSA (obstructive sleep apnea) 07/23/2020   Type 2 diabetes mellitus with other specified complication (HCC) 01/31/2020   BPH associated with nocturia 01/31/2020   Hypothyroidism, postsurgical 01/31/2020   Abd pain -possibly calculous cholecystitis -CT 10/4 -distended gallbladder with pericholecystic inflammation -Ultrasound 10/4 -multiple gallstones, 1.4 x 1 cm probable gallbladder polyp versus sludge -HIDA pending - Zosyn   VTE prophylaxis: SCDs, okay for Lovenox/heparin  from our standpoint  Class I obesity History of tonsillar cancer status post XRT Anemia likely secondary to cancer treatment   Disposition: HIDA pending.  Can have a diet after HIDA today, n.p.o. after midnight except meds final plan depends on results of HIDA scan  Data reviewed: Reviewed vitals since admission, ins and outs for 24 hours, labs since admission, CT abdomen, ultrasound, outpatient oncology note, [facility] H&P  Moderate medical decision making  LOS: 1 day    Isaac HERO. Tanda, MD, FACS General, Bariatric, & Minimally Invasive Surgery [phone] [facility], [facility]

## 2024-01-23 NOTE — H&P (View-Only) (Signed)
 Patient ID: Isaac Carpenter, male   DOB: 1951-03-06, 73 y.o.   MRN: [id number]   Acute Care Surgery Service Progress Note:    Chief Complaint/Subjective: Feels better No n/v Not much abd pain  Objective: Vital signs in last 24 hours: Temp:  [97.5 F (36.4 C)-100 F (37.8 C)] 97.9 F (36.6 C) (10/05 0507) Pulse Rate:  [69-99] 88 (10/05 0507) Resp:  [14-18] 15 (10/05 0507) BP: (105-144)/(55-78) 135/57 (10/05 0507) SpO2:  [87 %-100 %] 100 % (10/05 0507) Weight:  [887 kg] 112 kg (10/04 1018) Last BM Date : 01/21/24  Intake/Output from previous day: 10/04 0701 - 10/05 0700 In: 3393 [I.V.:287.8; IV Piggyback:3105.2] Out: -  Intake/Output this shift: No intake/output data recorded.  Lungs:  nonlabored  Cardiovascular: reg  Abd: obese, soft, min TTP, PEG intact  Extremities: no edema, +SCDs  Neuro: alert, nonfocal  Lab Results: CBC  Recent Labs    01/22/24 1201 01/23/24 0253  WBC 5.8 6.5  HGB 10.2* 9.8*  HCT 32.3* 31.0*  PLT 150 138*   BMET Recent Labs    01/22/24 1201 01/23/24 0253  NA 137 137  K 3.6 3.7  CL 101 101  CO2 27 26  GLUCOSE 144* 142*  BUN 20 15  CREATININE 0.48* 0.44*  CALCIUM  9.1 8.8*   LFT    Latest Ref Rng & Units 01/23/2024    2:53 AM 01/22/2024   12:01 PM 01/21/2024    8:21 AM  Hepatic Function  Total Protein 6.5 - 8.1 g/dL 5.7  6.1  6.5   Albumin 3.5 - 5.0 g/dL 3.3  3.6  3.6   AST 15 - 41 U/L 30  22  16    ALT 0 - 44 U/L 16  12  11    Alk Phosphatase 38 - 126 U/L 105  80  71   Total Bilirubin 0.0 - 1.2 mg/dL 1.0  1.0  1.2    PT/INR No results for input(s): LABPROT, INR in the last 72 hours. ABG No results for input(s): PHART, HCO3 in the last 72 hours.  Invalid input(s): PCO2, PO2  Studies/Results:  Anti-infectives: Anti-infectives (From admission, onward)    Start     Dose/Rate Route Frequency Ordered Stop   01/22/24 2200  piperacillin -tazobactam (ZOSYN ) IVPB 3.375 g  Status:  Discontinued        3.375 g 100  mL/hr over 30 Minutes Intravenous Every 8 hours 01/22/24 1406 01/22/24 1408   01/22/24 2200  piperacillin -tazobactam (ZOSYN ) IVPB 3.375 g        3.375 g 12.5 mL/hr over 240 Minutes Intravenous Every 8 hours 01/22/24 1408     01/22/24 1330  piperacillin -tazobactam (ZOSYN ) IVPB 3.375 g        3.375 g 100 mL/hr over 30 Minutes Intravenous  Once 01/22/24 1321 01/22/24 1424       Medications: Scheduled Meds:  ALPRAZolam   0.5 mg Oral BID   atorvastatin   20 mg Oral Daily   Chlorhexidine  Gluconate Cloth  6 each Topical Daily   doxazosin   1 mg Oral QHS   levothyroxine   88 mcg Oral Daily   pantoprazole   40 mg Oral Daily   sodium chloride  flush  10-40 mL Intracatheter Q12H   Continuous Infusions:  sodium chloride  125 mL/hr at 01/23/24 0815   piperacillin -tazobactam (ZOSYN )  IV 3.375 g (01/23/24 0626)   PRN Meds:.acetaminophen  **OR** acetaminophen , HYDROmorphone (DILAUDID) injection, ondansetron  **OR** ondansetron  (ZOFRAN ) IV, oxyCODONE , sodium chloride  flush  Assessment/Plan: Patient Active Problem List   Diagnosis Date Noted  Acute cholecystitis 01/22/2024   Prediabetes 01/22/2024   Type 2 diabetes mellitus (HCC) 01/22/2024   Abdominal pain 01/19/2024   Cancer (HCC) 08/14/2023   Febrile neutropenia 08/14/2023   S/P percutaneous endoscopic gastrostomy (PEG) tube placement (HCC) 08/14/2023   Hyponatremia 08/14/2023   Normocytic anemia 08/14/2023   Chemotherapy induced neutropenia 07/30/2023   Fatigue 07/16/2023   Situational anxiety 07/09/2023   Encounter for antineoplastic chemotherapy 07/09/2023   Dysphagia 07/09/2023   Port-A-Cath in place 07/05/2023   Sensorineural hearing loss (SNHL) of right ear 06/23/2023   Cancer associated pain 06/11/2023   Coordination of complex care 06/11/2023   Malignant neoplasm of tonsillar fossa (HCC) 06/08/2023   Malignant neoplasm of prostate (HCC) 07/17/2022   Hyperlipidemia 05/11/2021   Morbid obesity (HCC)-BMI 37.8 with DM II,HLD,OSA,HTN  05/09/2021   Hypertension, essential, benign 07/23/2020   OSA (obstructive sleep apnea) 07/23/2020   Type 2 diabetes mellitus with other specified complication (HCC) 01/31/2020   BPH associated with nocturia 01/31/2020   Hypothyroidism, postsurgical 01/31/2020   Abd pain -possibly calculous cholecystitis -CT 10/4 -distended gallbladder with pericholecystic inflammation -Ultrasound 10/4 -multiple gallstones, 1.4 x 1 cm probable gallbladder polyp versus sludge -HIDA pending - Zosyn   VTE prophylaxis: SCDs, okay for Lovenox/heparin  from our standpoint  Class I obesity History of tonsillar cancer status post XRT Anemia likely secondary to cancer treatment   Disposition: HIDA pending.  Can have a diet after HIDA today, n.p.o. after midnight except meds final plan depends on results of HIDA scan  Data reviewed: Reviewed vitals since admission, ins and outs for 24 hours, labs since admission, CT abdomen, ultrasound, outpatient oncology note, [facility] H&P  Moderate medical decision making  LOS: 1 day    Camellia HERO. Tanda, MD, FACS General, Bariatric, & Minimally Invasive Surgery [phone] [facility], [facility]

## 2024-01-23 NOTE — Progress Notes (Signed)
 PROGRESS NOTE    Isaac Carpenter  FMW:[id number] DOB: [birth year] DOA: 01/22/2024 PCP: [name], [name], MD   Brief Narrative:  Isaac Carpenter is a 73 y.o. male with medical history significant of type 2 diabetes, anxiety, class I obesity, osteoarthritis, BPH, prostate cancer, tonsillar fossa cancer, history of PEG placement(currently being recommended for removal), chemotherapy-induced neutropenia, febrile neutropenia who presents with worsening abdominal pain and constipation.  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 based on imaging but discharged given resolution of symptoms at that time  Assessment & Plan:   Principal Problem:   Acute cholecystitis Active Problems:   Normocytic anemia   S/P percutaneous endoscopic gastrostomy (PEG) tube placement (HCC)   Hypothyroidism, postsurgical   Hypertension, essential, benign   Hyperlipidemia   OSA (obstructive sleep apnea)   Type 2 diabetes mellitus (HCC)   Intractable abdominal pain  Rule out cholecystitis, possibly calculus given history -Ultrasound notable for multiple gallstones without sonographic evidence of cholecystitis  - HIDA scan pending -General Surgery following, appreciate insight and recommendations -Currently n.p.o., holding all nonessential medications at this time given patient requires medications to be given with moderate amount of Ensure/similar liquid which defeats the purpose of being n.p.o. given possible need for procedure as above  Normocytic anemia -stable, currently at baseline.  S/P percutaneous endoscopic gastrostomy  (PEG) tube placement (HCC) Recommended for removal in the outpatient setting, patient asking for PEG tube removal if he requires surgery, defer to general surgery   Hypothyroidism, postsurgical Continue levothyroxine , if unable to take p.o. safely will administer IV.   Hypertension, essential, benign Hold scheduled doxazosin   at this time, as needed hydralazine ordered   Hyperlipidemia Hold statin OSA (obstructive sleep apnea) May use CPAP at bedtime. Type 2 diabetes mellitus (HCC) Currently NPO. CBG monitoring every 6 hours.  DVT prophylaxis: SCDs Start: 01/22/24 1406 Code Status:   Code Status: Full Code Family Communication: Wife at bedside  Status is: Patient  Dispo: The patient is from: Home              Anticipated d/c is to: Home              Anticipated d/c date is: 48 to 72 hours              Patient currently not medically stable for discharge  Consultants:  General Surgery  Procedures:  Pending above  Antimicrobials:  Zosyn   Subjective: No acute issues or events overnight, difficulty taking p.o. medications today with sips, as such we will hold any nonessential medications at this time.  Otherwise denies headache fever chills shortness of breath nausea vomiting or diarrhea  Objective: Vitals:   01/22/24 1438 01/22/24 2103 01/22/24 2238 01/23/24 0507  BP: 131/60 (!) 136/55  (!) 135/57  Pulse: 69 99 94 88  Resp: 14 18  15   Temp: 97.7 F (36.5 C) 100 F (37.8 C)  97.9 F (36.6 C)  TempSrc: Oral Oral  Oral  SpO2: 99% (!) 87% 99% 100%  Weight:      Height:        Intake/Output Summary (Last 24 hours) at 01/23/2024 0719 Last data filed at 01/23/2024 0606 Gross per 24 hour  Intake 3392.98 ml  Output --  Net 3392.98 ml   Filed Weights   01/22/24 1018  Weight: 112 kg    Examination:  General:  Pleasantly resting in bed, No acute distress. HEENT:  Normocephalic atraumatic.  Sclerae nonicteric,  noninjected.  Extraocular movements intact bilaterally. Neck:  Without mass or deformity.  Trachea is midline. Lungs/chest:  Clear to auscultate bilaterally without rhonchi, wheeze, or rales.  Right port noted Heart:  Regular rate and rhythm.  Without murmurs, rubs, or gallops. Abdomen:  Soft, nontender, nondistended.  PEG tube noted. Extremities: Without cyanosis, clubbing, edema, or  obvious deformity. Skin:  Warm and dry, no erythema.  Data Reviewed: I have personally reviewed following labs and imaging studies  CBC: Recent Labs  Lab 01/19/24 0139 01/19/24 0205 01/19/24 0521 01/21/24 0821 01/22/24 1201 01/23/24 0253  WBC 5.3  --  5.8 5.9 5.8 6.5  NEUTROABS  --   --   --  4.7  --   --   HGB 10.7* 10.5* 10.9* 10.9* 10.2* 9.8*  HCT 33.7* 31.0* 35.1* 32.4* 32.3* 31.0*  MCV 98.5  --  98.9 94.7 97.6 98.4  PLT 154  --  143* 165 150 138*   Basic Metabolic Panel: Recent Labs  Lab 01/19/24 0139 01/19/24 0205 01/19/24 0521 01/21/24 0821 01/22/24 1201 01/23/24 0253  NA 140 140  --  138 137 137  K 3.6 3.5  --  3.7 3.6 3.7  CL 102 101  --  102 101 101  CO2 26  --   --  32 27 26  GLUCOSE 151* 155*  --  144* 144* 142*  BUN 24* 22  --  20 20 15   CREATININE 0.56* 0.60* 0.49* 0.59* 0.48* 0.44*  CALCIUM  9.4  --   --  9.3 9.1 8.8*  MG  --   --   --  2.0  --   --    GFR: Estimated Creatinine Clearance: 109.4 mL/min (A) (by C-G formula based on SCr of 0.44 mg/dL (L)). Liver Function Tests: Recent Labs  Lab 01/19/24 0139 01/21/24 0821 01/22/24 1201 01/23/24 0253  AST 26 16 22 30   ALT 15 11 12 16   ALKPHOS 76 71 80 105  BILITOT 0.6 1.2 1.0 1.0  PROT 6.0* 6.5 6.1* 5.7*  ALBUMIN 3.8 3.6 3.6 3.3*   Recent Labs  Lab 01/19/24 0139 01/22/24 1201  LIPASE 19 <10*   CBG: Recent Labs  Lab 01/19/24 0830 01/19/24 1202 01/22/24 1823 01/22/24 2304 01/23/24 0509  GLUCAP 149* 144* 129* 136* 149*   Sepsis Labs: Recent Labs  Lab 01/19/24 0206 01/22/24 1347  LATICACIDVEN 1.3 1.0   No results found for this or any previous visit (from the past 240 hours).   Radiology Studies: US  Abdomen Limited RUQ (LIVER/GB) Result Date: 01/22/2024 CLINICAL DATA:  [id number] Pain [id number] EXAM: ULTRASOUND ABDOMEN LIMITED RIGHT UPPER QUADRANT COMPARISON:  01/22/2024 CTA of the abdomen and pelvis FINDINGS: Gallbladder: Multiple small gallstones. 1.4 x 1 cm echogenicity along the  nondependent gallbladder wall near the fundus. No wall thickening or pericholecystic fluid. No sonographic Murphy's sign noted by sonographer. Common bile duct: Diameter: 4 mm Liver: Normal echogenicity. No focal lesion identified. No intrahepatic biliary ductal dilation. Portal vein is patent on color Doppler imaging with normal direction of blood flow towards the liver. Right Kidney: Partially visualized. No mass. No hydronephrosis or nephrolithiasis. Other: None. IMPRESSION: 1. Multiple small gallstones. No sonographic findings of acute cholecystitis. If concern persists for acute cholecystitis, given the findings on the recent CT, a nuclear medicine hepatobiliary scan would be recommended for further characterization. 2. Along the nondependent gallbladder wall near the fundus, there is a nonmobile echogenicity measuring 1.4 x 1 cm, possibly adherent biliary sludge or a gallbladder polyp. Follow-up should  be considered, as documented below. Polyp measuring equal to or greater than 1.0 cm: -Increased risk of malignancy. Nonemergent surgical consultation recommended for further management. RECOMMENDATIONS:As per the 2021 joint European Society guidelines, suggested management of gallbladder polyp(s) are as follows: *NB: Risk factors for gallbladder malignancy include: > 67 years old, history of underlying primary sclerosing cholangitis, Asian ethnicity, sessile polyp (including focal wall thickening > 0.4 cm). Electronically Signed   By: Rogelia Myers M.D.   On: 01/22/2024 16:33   CT Angio Abd/Pel W and/or Wo Contrast Result Date: 01/22/2024 CLINICAL DATA:  eval for significant stenosis leading to ischemia. Recent ct abd venous showed panniculitis EXAM: CTA ABDOMEN AND PELVIS WITHOUT AND WITH CONTRAST TECHNIQUE: Multidetector CT imaging of the abdomen and pelvis was performed using the standard protocol during bolus administration of intravenous contrast. Multiplanar reconstructed images and MIPs were obtained  and reviewed to evaluate the vascular anatomy. RADIATION DOSE REDUCTION: This exam was performed according to the departmental dose-optimization program which includes automated exposure control, adjustment of the mA and/or kV according to patient size and/or use of iterative reconstruction technique. CONTRAST:  OMNIPAQUE  IOHEXOL  350 MG/ML SOLN COMPARISON:  01/19/2024 FINDINGS: VASCULAR Aorta: No aortic aneurysm or dissection. Scattered calcified aortic atherosclerosis. No hemodynamically significant stenosis. Celiac: Patent without acute thrombus, aneurysm, or dissection.Mild narrowing of the proximal celiac artery, possibly due to extrinsic compression. SMA: Patent without acute thrombus, aneurysm, or dissection.No hemodynamically significant stenosis. Renals: Patent without acute thrombus, aneurysm, or dissection.No hemodynamically significant stenosis. IMA: Patent without acute thrombus, aneurysm, or dissection.Moderate narrowing of the IMA ostium from calcified plaque. Inflow: Patent without acute thrombus, aneurysm, or dissection.No hemodynamically significant stenosis. Proximal Outflow: The bilateral common femoral and visualized portions of the superficial and profunda femoral arteries are patent without acute thrombus, aneurysm, or dissection.No hemodynamically significant stenosis. Veins: No obvious venous abnormality within the limitations of this arterial phase study. Review of the MIP images confirms the above findings. NON-VASCULAR Lower chest: No focal airspace consolidation or pleural effusion.Posterior bibasilar dependent atelectasis. Mild cardiomegaly. Multi-vessel coronary atherosclerosis. Hepatobiliary: No mass.The gallbladder is distended and fluid-filled without radiopaque stones. Subtle pericholecystic inflammation of the fat in the porta hepatis. There is also asymmetric enhancement within the adjacent hepatic parenchyma, suggesting hyperemia.No intrahepatic or extrahepatic biliary  ductal dilation. Pancreas: Diffuse fatty atrophy of the pancreatic parenchyma. No mass or ductal dilation.No peripancreatic inflammation or fluid collection. Spleen: Normal size. No mass. Adrenals/Urinary Tract: No adrenal masses. No renal mass. A couple of small nonobstructive right-sided calyceal calculi are present. No hydronephrosis. The urinary bladder is distended without focal abnormality. Stomach/Bowel:Well-positioned percutaneous gastrostomy tube in place. The stomach contains ingested material without focal abnormality. No small bowel wall thickening or inflammation. No small bowel obstruction.Normal appendix. Lymphatic: No intraabdominal or pelvic lymphadenopathy. Reproductive: No prostatomegaly. Brachytherapy seeds filling the prostate parenchyma. No free pelvic fluid. Other: Similar findings of mild mesenteric panniculitis. No pneumoperitoneum or ascites. Musculoskeletal: No acute fracture or destructive lesion.Multilevel degenerative disc disease of the spine. Thoracic DISH. IMPRESSION: VASCULAR 1. No aortic aneurysm or aortic dissection. 2. No acute thrombus within the mesenteric or renal vessels. Moderate narrowing of the IMA ostium from chunky calcified plaque. Otherwise, the celiac and superior mesenteric arteries are widely patent. NON-VASCULAR 1. Fluid-filled, distended gallbladder with pericholecystic inflammation. Asymmetric enhancement is also noted in the adjacent hepatic parenchyma, suggesting hyperemia. These findings are concerning for acute cholecystitis and a nuclear medicine hepatobiliary scan is recommended for further characterization. 2. Couple of small nonobstructive calyceal calculi present in  the right kidney. No hydronephrosis. 3. Well-positioned percutaneous gastrostomy tube. Aortic Atherosclerosis (ICD10-I70.0). Electronically Signed   By: Rogelia Myers M.D.   On: 01/22/2024 13:17   Scheduled Meds:  ALPRAZolam   0.5 mg Oral BID   atorvastatin   20 mg Oral Daily    Chlorhexidine  Gluconate Cloth  6 each Topical Daily   doxazosin   1 mg Oral QHS   levothyroxine   88 mcg Oral Daily   pantoprazole   40 mg Oral Daily   sodium chloride  flush  10-40 mL Intracatheter Q12H   Continuous Infusions:  sodium chloride  125 mL/hr at 01/22/24 2122   piperacillin -tazobactam (ZOSYN )  IV 3.375 g (01/23/24 0626)     LOS: 1 day   Time spent:  Elsie JAYSON [place], DO [facility]  If 7PM-7AM, please contact night-coverage [email or web address]  01/23/2024, 7:19 AM

## 2024-01-23 NOTE — Plan of Care (Signed)
   Problem: Pain Managment: Goal: General experience of comfort will improve and/or be controlled Outcome: Progressing   Problem: Skin Integrity: Goal: Risk for impaired skin integrity will decrease Outcome: Progressing

## 2024-01-24 ENCOUNTER — Encounter (HOSPITAL_COMMUNITY)
Admission: EM | Disposition: A | Discharge status: Home / Self Care | Payer: Self-pay | Source: Home / Self Care | Attending: Internal Medicine

## 2024-01-24 ENCOUNTER — Inpatient Hospital Stay (HOSPITAL_COMMUNITY): Admitting: Certified Registered Nurse Anesthetist

## 2024-01-24 ENCOUNTER — Encounter (HOSPITAL_COMMUNITY): Payer: Self-pay | Admitting: Internal Medicine

## 2024-01-24 ENCOUNTER — Ambulatory Visit

## 2024-01-24 DIAGNOSIS — K810 Acute cholecystitis: Secondary | ICD-10-CM | POA: Diagnosis not present

## 2024-01-24 DIAGNOSIS — K8010 Calculus of gallbladder with chronic cholecystitis without obstruction: Secondary | ICD-10-CM

## 2024-01-24 HISTORY — PX: CHOLECYSTECTOMY: SHX55

## 2024-01-24 LAB — COMPREHENSIVE METABOLIC PANEL WITH GFR
ALT: 15 U/L (ref 0–44)
AST: 28 U/L (ref 15–41)
Albumin: 3.1 g/dL — ABNORMAL LOW (ref 3.5–5.0)
Alkaline Phosphatase: 100 U/L (ref 38–126)
Anion gap: 9 (ref 5–15)
BUN: 14 mg/dL (ref 8–23)
CO2: 25 mmol/L (ref 22–32)
Calcium: 8.6 mg/dL — ABNORMAL LOW (ref 8.9–10.3)
Chloride: 101 mmol/L (ref 98–111)
Creatinine, Ser: 0.47 mg/dL — ABNORMAL LOW (ref 0.61–1.24)
GFR, Estimated: >60 mL/min (ref >60)
Glucose, Bld: 108 mg/dL — ABNORMAL HIGH (ref 70–99)
Potassium: 3.4 mmol/L — ABNORMAL LOW (ref 3.5–5.1)
Sodium: 135 mmol/L (ref 135–145)
Total Bilirubin: 1.2 mg/dL (ref 0.0–1.2)
Total Protein: 5.5 g/dL — ABNORMAL LOW (ref 6.5–8.1)

## 2024-01-24 LAB — CBC
HCT: 29 % — ABNORMAL LOW (ref 39.0–52.0)
Hemoglobin: 9.5 g/dL — ABNORMAL LOW (ref 13.0–17.0)
MCH: 31.8 pg (ref 26.0–34.0)
MCHC: 32.8 g/dL (ref 30.0–36.0)
MCV: 97 fL (ref 80.0–100.0)
Platelets: 144 K/uL — ABNORMAL LOW (ref 150–400)
RBC: 2.99 MIL/uL — ABNORMAL LOW (ref 4.22–5.81)
RDW: 13.5 % (ref 11.5–15.5)
WBC: 7.8 K/uL (ref 4.0–10.5)
nRBC: 0 % (ref 0.0–0.2)

## 2024-01-24 LAB — GLUCOSE, CAPILLARY
Glucose-Capillary: 100 mg/dL — ABNORMAL HIGH (ref 70–99)
Glucose-Capillary: 117 mg/dL — ABNORMAL HIGH (ref 70–99)
Glucose-Capillary: 121 mg/dL — ABNORMAL HIGH (ref 70–99)
Glucose-Capillary: 137 mg/dL — ABNORMAL HIGH (ref 70–99)
Glucose-Capillary: 154 mg/dL — ABNORMAL HIGH (ref 70–99)

## 2024-01-24 SURGERY — LAPAROSCOPIC CHOLECYSTECTOMY WITH INTRAOPERATIVE CHOLANGIOGRAM
Anesthesia: General | Site: Abdomen

## 2024-01-24 MED ORDER — DEXAMETHASONE SODIUM PHOSPHATE 10 MG/ML IJ SOLN
INTRAMUSCULAR | Status: AC
  Filled 2024-01-24: qty 1

## 2024-01-24 MED ORDER — SODIUM CHLORIDE 0.9 % IR SOLN
Status: DC | PRN
  Administered 2024-01-24: 1000 mL

## 2024-01-24 MED ORDER — BUPIVACAINE-EPINEPHRINE (PF) 0.25% -1:200000 IJ SOLN
INTRAMUSCULAR | Status: AC
  Filled 2024-01-24: qty 30

## 2024-01-24 MED ORDER — PROPOFOL 10 MG/ML IV BOLUS
INTRAVENOUS | Status: AC
  Filled 2024-01-24: qty 20

## 2024-01-24 MED ORDER — EPHEDRINE SULFATE-NACL 50-0.9 MG/10ML-% IV SOSY
PREFILLED_SYRINGE | INTRAVENOUS | Status: DC | PRN
  Administered 2024-01-24 (×2): 5 mg via INTRAVENOUS

## 2024-01-24 MED ORDER — FENTANYL CITRATE (PF) 100 MCG/2ML IJ SOLN
INTRAMUSCULAR | Status: AC
  Filled 2024-01-24: qty 2

## 2024-01-24 MED ORDER — ARTIFICIAL TEARS OPHTHALMIC OINT
TOPICAL_OINTMENT | OPHTHALMIC | Status: AC
Start: 2024-01-24 — End: 2024-01-24
  Filled 2024-01-24: qty 3.5

## 2024-01-24 MED ORDER — DROPERIDOL 2.5 MG/ML IJ SOLN: 0.6250 mg | Freq: Once | INTRAMUSCULAR | Status: DC | PRN

## 2024-01-24 MED ORDER — LIDOCAINE HCL (PF) 2 % IJ SOLN
INTRAMUSCULAR | Status: AC
  Filled 2024-01-24: qty 5

## 2024-01-24 MED ORDER — OXYCODONE HCL 5 MG/5ML PO SOLN: 5.0000 mg | Freq: Once | ORAL | Status: DC | PRN

## 2024-01-24 MED ORDER — ONDANSETRON HCL 4 MG/2ML IJ SOLN
INTRAMUSCULAR | Status: AC
  Filled 2024-01-24: qty 2

## 2024-01-24 MED ORDER — ROCURONIUM BROMIDE 10 MG/ML (PF) SYRINGE
PREFILLED_SYRINGE | INTRAVENOUS | Status: DC | PRN
  Administered 2024-01-24: 60 mg via INTRAVENOUS
  Administered 2024-01-24: 10 mg via INTRAVENOUS

## 2024-01-24 MED ORDER — HEMOSTATIC AGENTS (NO CHARGE) OPTIME
TOPICAL | Status: DC | PRN
  Administered 2024-01-24: 1

## 2024-01-24 MED ORDER — SUGAMMADEX SODIUM 200 MG/2ML IV SOLN
INTRAVENOUS | Status: DC | PRN
  Administered 2024-01-24: 200 mg via INTRAVENOUS

## 2024-01-24 MED ORDER — FENTANYL CITRATE (PF) 100 MCG/2ML IJ SOLN
INTRAMUSCULAR | Status: DC | PRN
  Administered 2024-01-24 (×2): 25 ug via INTRAVENOUS
  Administered 2024-01-24: 100 ug via INTRAVENOUS

## 2024-01-24 MED ORDER — LIDOCAINE 2% (20 MG/ML) 5 ML SYRINGE: INTRAMUSCULAR | Status: DC | PRN

## 2024-01-24 MED ORDER — EPHEDRINE 5 MG/ML INJ
INTRAVENOUS | Status: AC
Start: 2024-01-24 — End: 2024-01-24
  Filled 2024-01-24: qty 5

## 2024-01-24 MED ORDER — CHLORHEXIDINE GLUCONATE 0.12 % MT SOLN: 15.0000 mL | Freq: Once | OROMUCOSAL | Status: DC

## 2024-01-24 MED ORDER — ONDANSETRON HCL 4 MG/2ML IJ SOLN
INTRAMUSCULAR | Status: DC | PRN
  Administered 2024-01-24: 4 mg via INTRAVENOUS

## 2024-01-24 MED ORDER — LIDOCAINE HCL (PF) 2 % IJ SOLN
INTRAMUSCULAR | Status: DC | PRN
  Administered 2024-01-24: 100 mg via INTRADERMAL

## 2024-01-24 MED ORDER — BUPIVACAINE-EPINEPHRINE 0.25% -1:200000 IJ SOLN
INTRAMUSCULAR | Status: DC | PRN
  Administered 2024-01-24: 25 mL

## 2024-01-24 MED ORDER — PHENYLEPHRINE HCL-NACL 20-0.9 MG/250ML-% IV SOLN
INTRAVENOUS | Status: DC | PRN
  Administered 2024-01-24: 35 ug/min via INTRAVENOUS

## 2024-01-24 MED ORDER — SODIUM CHLORIDE (PF) 0.9 % IJ SOLN
INTRAMUSCULAR | Status: AC
Start: 2024-01-24 — End: 2024-01-24
  Filled 2024-01-24: qty 20

## 2024-01-24 MED ORDER — DEXAMETHASONE SODIUM PHOSPHATE 10 MG/ML IJ SOLN
INTRAMUSCULAR | Status: DC | PRN
  Administered 2024-01-24: 5 mg via INTRAVENOUS

## 2024-01-24 MED ORDER — INSULIN ASPART 100 UNIT/ML IJ SOLN: 0.0000 [IU] | INTRAMUSCULAR | Status: DC | PRN

## 2024-01-24 MED ORDER — ACETAMINOPHEN 500 MG PO TABS: 1000.0000 mg | ORAL_TABLET | Freq: Once | ORAL | Status: DC

## 2024-01-24 MED ORDER — LACTATED RINGERS IR SOLN
Status: DC | PRN
  Administered 2024-01-24: 1000 mL

## 2024-01-24 MED ORDER — OXYCODONE HCL 5 MG PO TABS: 5.0000 mg | ORAL_TABLET | Freq: Once | ORAL | Status: DC | PRN

## 2024-01-24 MED ORDER — FENTANYL CITRATE PF 50 MCG/ML IJ SOSY: 25.0000 ug | PREFILLED_SYRINGE | INTRAMUSCULAR | Status: DC | PRN

## 2024-01-24 MED ORDER — ROCURONIUM BROMIDE 10 MG/ML (PF) SYRINGE
PREFILLED_SYRINGE | INTRAVENOUS | Status: AC
  Filled 2024-01-24: qty 10

## 2024-01-24 MED ORDER — PROPOFOL 10 MG/ML IV BOLUS
INTRAVENOUS | Status: DC | PRN
  Administered 2024-01-24: 180 mg via INTRAVENOUS

## 2024-01-24 MED ORDER — EPHEDRINE 5 MG/ML INJ
INTRAVENOUS | Status: AC
  Filled 2024-01-24: qty 5

## 2024-01-24 MED ORDER — LACTATED RINGERS IV SOLN: INTRAVENOUS | Status: DC

## 2024-01-24 MED ORDER — SUGAMMADEX SODIUM 200 MG/2ML IV SOLN
INTRAVENOUS | Status: AC
  Filled 2024-01-24: qty 2

## 2024-01-24 SURGICAL SUPPLY — 36 items
APPLICATOR ARISTA FLEXITIP XL (MISCELLANEOUS) IMPLANT
BAG COUNTER SPONGE SURGICOUNT (BAG) IMPLANT
CABLE HIGH FREQUENCY MONO STRZ (ELECTRODE) ×1 IMPLANT
CATH REDDICK CHOLANGI 4FR 50CM (CATHETERS) ×1 IMPLANT
CHLORAPREP W/TINT 26 (MISCELLANEOUS) ×1 IMPLANT
CLIP APPLIE 5 13 M/L LIGAMAX5 (MISCELLANEOUS) ×1 IMPLANT
COVER MAYO STAND XLG (MISCELLANEOUS) ×1 IMPLANT
COVER SURGICAL LIGHT HANDLE (MISCELLANEOUS) ×1 IMPLANT
DERMABOND ADVANCED .7 DNX12 (GAUZE/BANDAGES/DRESSINGS) ×1 IMPLANT
DRAPE C-ARM 42X120 X-RAY (DRAPES) ×1 IMPLANT
DRSG TEGADERM 4X4.75 (GAUZE/BANDAGES/DRESSINGS) IMPLANT
ELECT REM PT RETURN 15FT ADLT (MISCELLANEOUS) ×1 IMPLANT
EVACUATOR SILICONE 100CC (DRAIN) IMPLANT
GLOVE BIO SURGEON STRL SZ7.5 (GLOVE) ×1 IMPLANT
GOWN STRL REUS W/ TWL LRG LVL3 (GOWN DISPOSABLE) IMPLANT
HEMOSTAT ARISTA ABSORB 3G PWDR (HEMOSTASIS) IMPLANT
HEMOSTAT SNOW SURGICEL 2X4 (HEMOSTASIS) IMPLANT
IRRIGATION SUCT STRKRFLW 2 WTP (MISCELLANEOUS) ×1 IMPLANT
IV CATH 14GX2 1/4 (CATHETERS) ×1 IMPLANT
KIT BASIN OR (CUSTOM PROCEDURE TRAY) ×1 IMPLANT
KIT TURNOVER KIT A (KITS) ×1 IMPLANT
PENCIL SMOKE EVACUATOR (MISCELLANEOUS) IMPLANT
SCISSORS LAP 5X35 DISP (ENDOMECHANICALS) ×1 IMPLANT
SET TUBE SMOKE EVAC HIGH FLOW (TUBING) ×1 IMPLANT
SLEEVE Z-THREAD 5X100MM (TROCAR) ×2 IMPLANT
SOL PREP POV-IOD 4OZ 10% (MISCELLANEOUS) IMPLANT
SPIKE FLUID TRANSFER (MISCELLANEOUS) ×1 IMPLANT
SPONGE DRAIN TRACH 4X4 STRL 2S (GAUZE/BANDAGES/DRESSINGS) IMPLANT
SUT ETHILON 2 0 PS N (SUTURE) IMPLANT
SUT MNCRL AB 4-0 PS2 18 (SUTURE) ×1 IMPLANT
SUT VICRYL 0 UR6 27IN ABS (SUTURE) ×2 IMPLANT
SYSTEM BAG RETRIEVAL 10MM (BASKET) ×1 IMPLANT
TOWEL OR 17X26 10 PK STRL BLUE (TOWEL DISPOSABLE) ×1 IMPLANT
TRAY LAPAROSCOPIC (CUSTOM PROCEDURE TRAY) ×1 IMPLANT
TROCAR BALLN 12MMX100 BLUNT (TROCAR) ×1 IMPLANT
TROCAR Z-THREAD OPTICAL 5X100M (TROCAR) ×1 IMPLANT

## 2024-01-24 NOTE — Anesthesia Preprocedure Evaluation (Addendum)
 Anesthesia Evaluation  Patient identified by MRN, date of birth, ID band Patient awake    Reviewed: Allergy & Precautions, NPO status , Patient's Chart, lab work & pertinent test results  History of Anesthesia Complications Negative for: history of anesthetic complications  Airway Mallampati: IV  TM Distance: >3 FB Neck ROM: Full    Dental  (+) Poor Dentition, Missing, Chipped   Pulmonary sleep apnea    Pulmonary exam normal        Cardiovascular hypertension, Pt. on medications Normal cardiovascular exam     Neuro/Psych   Anxiety     negative neurological ROS     GI/Hepatic Neg liver ROS,GERD  Medicated,,  Endo/Other  diabetes, Type 2Hypothyroidism    Renal/GU negative Renal ROS     Musculoskeletal  (+) Arthritis ,    Abdominal   Peds  Hematology  (+) Blood dyscrasia (Hgb 9.5, Plt 144k), anemia   Anesthesia Other Findings   Reproductive/Obstetrics                              Anesthesia Physical Anesthesia Plan  ASA: 2  Anesthesia Plan: General   Post-op Pain Management: Tylenol  PO (pre-op)*   Induction: Intravenous  PONV Risk Score and Plan: 2 and Treatment may vary due to age or medical condition, Ondansetron , Dexamethasone  and Midazolam   Airway Management Planned: Oral ETT  Additional Equipment: None  Intra-op Plan:   Post-operative Plan: Extubation in OR  Informed Consent: I have reviewed the patients History and Physical, chart, labs and discussed the procedure including the risks, benefits and alternatives for the proposed anesthesia with the patient or authorized representative who has indicated his/her understanding and acceptance.     Dental advisory given  Plan Discussed with: CRNA  Anesthesia Plan Comments:          Anesthesia Quick Evaluation

## 2024-01-24 NOTE — Anesthesia Procedure Notes (Signed)
 Procedure Name: Intubation Date/Time: 01/24/2024 9:22 AM  Performed by: Zulema [name] PARAS, CRNAPre-anesthesia Checklist: Patient identified, Emergency Drugs available, Suction available and Patient being monitored Patient Re-evaluated:Patient Re-evaluated prior to induction Oxygen Delivery Method: Circle system utilized Preoxygenation: Pre-oxygenation with 100% oxygen Induction Type: IV induction Ventilation: Mask ventilation without difficulty and Oral airway inserted - appropriate to patient size Laryngoscope Size: 4 and Glidescope Grade View: Grade I Tube type: Oral Tube size: 7.5 mm Number of attempts: 1 Airway Equipment and Method: Stylet and Oral airway Placement Confirmation: ETT inserted through vocal cords under direct vision, positive ETCO2 and breath sounds checked- equal and bilateral Secured at: 21 cm Tube secured with: Tape Dental Injury: Teeth and Oropharynx as per pre-operative assessment  Difficulty Due To: Difficulty was anticipated Comments: Glidescope utilized for intubation based on prior anesthesia records and patient history. Easy mask with oral airway utilized. DL with Glidescope 4 and ETT 7.5 gently placed. ETCO2+ and BBS =.

## 2024-01-24 NOTE — Progress Notes (Signed)
 PROGRESS NOTE    Isaac Carpenter  FMW:[id number] DOB: [birth year] DOA: 01/22/2024 PCP: [name], [name], MD   Brief Narrative:  Isaac Carpenter is a 73 y.o. male with medical history significant of type 2 diabetes, anxiety, class I obesity, osteoarthritis, BPH, prostate cancer, tonsillar fossa cancer, history of PEG placement(currently being recommended for removal), chemotherapy-induced neutropenia, febrile neutropenia who presents with worsening abdominal pain and constipation.  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 based on imaging but discharged given resolution of symptoms at that time  Assessment & Plan:   Principal Problem:   Acute cholecystitis Active Problems:   Normocytic anemia   S/P percutaneous endoscopic gastrostomy (PEG) tube placement (HCC)   Hypothyroidism, postsurgical   Hypertension, essential, benign   Hyperlipidemia   OSA (obstructive sleep apnea)   Type 2 diabetes mellitus (HCC)   Intractable abdominal pain  Rule out cholecystitis, possibly calculus given history - Ultrasound notable for multiple gallstones without sonographic evidence of cholecystitis  - HIDA scan consistent with cholecystitis, plan for lap chole later today with general surgery - General Surgery following, appreciate insight and recommendations - Postoperatively defer to general surgery for reinitiation of diet - Postoperative pain control per general surgery (Dilaudid/oxycodone )  Normocytic anemia -stable, currently at baseline.  S/P percutaneous endoscopic gastrostomy  (PEG) tube placement (HCC) Recommended for removal in the outpatient setting, patient asking for PEG tube removal if he requires surgery, defer to general surgery   Hypothyroidism, postsurgical Continue levothyroxine , if unable to take p.o. safely will administer IV.   Hypertension, essential, benign Hold scheduled doxazosin  at this time, as needed  hydralazine ordered   Hyperlipidemia Hold statin OSA (obstructive sleep apnea)  CPAP at bedtime. Type 2 diabetes mellitus (HCC) Currently NPO -once diet is resumed will transition to ACHS glucose checks  DVT prophylaxis: SCDs Start: 01/22/24 1406 Code Status:   Code Status: Full Code Family Communication: Wife at bedside  Status is: Patient  Dispo: The patient is from: Home              Anticipated d/c is to: Home              Anticipated d/c date is: 48 to 72 hours              Patient currently not medically stable for discharge  Consultants:  General Surgery  Procedures:  Pending above  Antimicrobials:  Zosyn   Subjective: No acute issues or events overnight, denies nausea vomiting diarrhea constipation headache fevers chills or chest pain.  Objective: Vitals:   01/23/24 0507 01/23/24 1430 01/23/24 2024 01/24/24 0536  BP: (!) 135/57 (!) 125/48 (!) 124/56 (!) 134/56  Pulse: 88 89 92 96  Resp: 15 18 19 19   Temp: 97.9 F (36.6 C) (!) 97.5 F (36.4 C) (!) 97.4 F (36.3 C) (!) 97.4 F (36.3 C)  TempSrc: Oral  Oral Oral  SpO2: 100% 100% 97% 99%  Weight:      Height:        Intake/Output Summary (Last 24 hours) at 01/24/2024 0731 Last data filed at 01/24/2024 0535 Gross per 24 hour  Intake 2192.29 ml  Output --  Net 2192.29 ml   Filed Weights   01/22/24 1018  Weight: 112 kg    Examination:  General:  Pleasantly resting in bed, No acute distress. HEENT:  Normocephalic atraumatic.  Sclerae nonicteric, noninjected.  Extraocular movements intact bilaterally. Neck:  Without mass or deformity.  Trachea  is midline. Lungs/chest:  Clear to auscultate bilaterally without rhonchi, wheeze, or rales.  Right port noted Heart:  Regular rate and rhythm.  Without murmurs, rubs, or gallops. Abdomen:  Soft, nontender, nondistended.  PEG tube noted. Extremities: Without cyanosis, clubbing, edema, or obvious deformity. Skin:  Warm and dry, no erythema.  Data Reviewed: I have  personally reviewed following labs and imaging studies  CBC: Recent Labs  Lab 01/19/24 0521 01/21/24 0821 01/22/24 1201 01/23/24 0253 01/24/24 0423  WBC 5.8 5.9 5.8 6.5 7.8  NEUTROABS  --  4.7  --   --   --   HGB 10.9* 10.9* 10.2* 9.8* 9.5*  HCT 35.1* 32.4* 32.3* 31.0* 29.0*  MCV 98.9 94.7 97.6 98.4 97.0  PLT 143* 165 150 138* 144*   Basic Metabolic Panel: Recent Labs  Lab 01/19/24 0139 01/19/24 0205 01/19/24 0521 01/21/24 0821 01/22/24 1201 01/23/24 0253 01/24/24 0423  NA 140 140  --  138 137 137 135  K 3.6 3.5  --  3.7 3.6 3.7 3.4*  CL 102 101  --  102 101 101 101  CO2 26  --   --  32 27 26 25   GLUCOSE 151* 155*  --  144* 144* 142* 108*  BUN 24* 22  --  20 20 15 14   CREATININE 0.56* 0.60* 0.49* 0.59* 0.48* 0.44* 0.47*  CALCIUM  9.4  --   --  9.3 9.1 8.8* 8.6*  MG  --   --   --  2.0  --   --   --    GFR: Estimated Creatinine Clearance: 109.4 mL/min (A) (by C-G formula based on SCr of 0.47 mg/dL (L)). Liver Function Tests: Recent Labs  Lab 01/19/24 0139 01/21/24 0821 01/22/24 1201 01/23/24 0253 01/24/24 0423  AST 26 16 22 30 28   ALT 15 11 12 16 15   ALKPHOS 76 71 80 105 100  BILITOT 0.6 1.2 1.0 1.0 1.2  PROT 6.0* 6.5 6.1* 5.7* 5.5*  ALBUMIN 3.8 3.6 3.6 3.3* 3.1*   Recent Labs  Lab 01/19/24 0139 01/22/24 1201  LIPASE 19 <10*   CBG: Recent Labs  Lab 01/23/24 0509 01/23/24 1204 01/23/24 1807 01/24/24 0004 01/24/24 0537  GLUCAP 149* 131* 117* 117* 100*   Sepsis Labs: Recent Labs  Lab 01/19/24 0206 01/22/24 1347  LATICACIDVEN 1.3 1.0   No results found for this or any previous visit (from the past 240 hours).   Radiology Studies: NM Hepatobiliary Liver Func Result Date: 01/24/2024 EXAM: NM HEPATOBILLARY SCAN 01/23/2024 05:41:32 PM TECHNIQUE: RADIOPHARMACEUTICAL: 5.19 mCi Tc-76m mebrofenin Dynamic images of the abdomen and pelvis were obtained in the anterior projection for 1 hour after intravenous administration of radiopharmaceutical.  COMPARISON: None available. CLINICAL HISTORY: Cholecystitis; has PEG, recent radiaition and chemotherapy for tonsil FINDINGS: Homogenous uptake within the liver. Normal clearance of the blood pool. Appropriate excretion into the biliary system. Activity is visualized in the small bowel. After 1 hour of imaging, there was no gallbladder activity visualized. Imaging was performed for an additional 1 hour without gallbladder activity. IMPRESSION: 1. No gallbladder activity visualized through 2 hours. Imaging findings are compatible with cholecystitis. Electronically signed by: Waddell Calk MD 01/24/2024 05:54 AM EDT RP Workstation: [id number]   DG CHEST PORT 1 VIEW Result Date: 01/24/2024 EXAM: 1 VIEW(S) XRAY OF THE CHEST 01/23/2024 11:44:00 PM COMPARISON: 01/19/2024 CLINICAL HISTORY: Port-A-Cath in place [id number]. Difficulty with Port-A-Cath use today. FINDINGS: LINES, TUBES AND DEVICES: Right CT Port-A-Cath in place with tip at cavoatrial junction, unchanged. LUNGS AND  PLEURA: No focal pulmonary opacity. No pulmonary edema. No pleural effusion. No pneumothorax. HEART AND MEDIASTINUM: Stable mild cardiomegaly. No acute abnormality of the mediastinal silhouette. BONES AND SOFT TISSUES: No acute osseous abnormality. IMPRESSION: 1. Right CT Port-A-Cath in place with tip at the cavoatrial junction. 2. Stable mild cardiomegaly. Electronically signed by: Franky Stanford MD 01/24/2024 12:11 AM EDT RP Workstation: [id number]   US  Abdomen Limited RUQ (LIVER/GB) Result Date: 01/22/2024 CLINICAL DATA:  [id number] Pain [id number] EXAM: ULTRASOUND ABDOMEN LIMITED RIGHT UPPER QUADRANT COMPARISON:  01/22/2024 CTA of the abdomen and pelvis FINDINGS: Gallbladder: Multiple small gallstones. 1.4 x 1 cm echogenicity along the nondependent gallbladder wall near the fundus. No wall thickening or pericholecystic fluid. No sonographic Murphy's sign noted by sonographer. Common bile duct: Diameter: 4 mm Liver: Normal echogenicity. No focal lesion  identified. No intrahepatic biliary ductal dilation. Portal vein is patent on color Doppler imaging with normal direction of blood flow towards the liver. Right Kidney: Partially visualized. No mass. No hydronephrosis or nephrolithiasis. Other: None. IMPRESSION: 1. Multiple small gallstones. No sonographic findings of acute cholecystitis. If concern persists for acute cholecystitis, given the findings on the recent CT, a nuclear medicine hepatobiliary scan would be recommended for further characterization. 2. Along the nondependent gallbladder wall near the fundus, there is a nonmobile echogenicity measuring 1.4 x 1 cm, possibly adherent biliary sludge or a gallbladder polyp. Follow-up should be considered, as documented below. Polyp measuring equal to or greater than 1.0 cm: -Increased risk of malignancy. Nonemergent surgical consultation recommended for further management. RECOMMENDATIONS:As per the 2021 joint European Society guidelines, suggested management of gallbladder polyp(s) are as follows: *NB: Risk factors for gallbladder malignancy include: > 43 years old, history of underlying primary sclerosing cholangitis, Asian ethnicity, sessile polyp (including focal wall thickening > 0.4 cm). Electronically Signed   By: Rogelia Myers M.D.   On: 01/22/2024 16:33   CT Angio Abd/Pel W and/or Wo Contrast Result Date: 01/22/2024 CLINICAL DATA:  eval for significant stenosis leading to ischemia. Recent ct abd venous showed panniculitis EXAM: CTA ABDOMEN AND PELVIS WITHOUT AND WITH CONTRAST TECHNIQUE: Multidetector CT imaging of the abdomen and pelvis was performed using the standard protocol during bolus administration of intravenous contrast. Multiplanar reconstructed images and MIPs were obtained and reviewed to evaluate the vascular anatomy. RADIATION DOSE REDUCTION: This exam was performed according to the departmental dose-optimization program which includes automated exposure control, adjustment of the mA  and/or kV according to patient size and/or use of iterative reconstruction technique. CONTRAST:  OMNIPAQUE  IOHEXOL  350 MG/ML SOLN COMPARISON:  01/19/2024 FINDINGS: VASCULAR Aorta: No aortic aneurysm or dissection. Scattered calcified aortic atherosclerosis. No hemodynamically significant stenosis. Celiac: Patent without acute thrombus, aneurysm, or dissection.Mild narrowing of the proximal celiac artery, possibly due to extrinsic compression. SMA: Patent without acute thrombus, aneurysm, or dissection.No hemodynamically significant stenosis. Renals: Patent without acute thrombus, aneurysm, or dissection.No hemodynamically significant stenosis. IMA: Patent without acute thrombus, aneurysm, or dissection.Moderate narrowing of the IMA ostium from calcified plaque. Inflow: Patent without acute thrombus, aneurysm, or dissection.No hemodynamically significant stenosis. Proximal Outflow: The bilateral common femoral and visualized portions of the superficial and profunda femoral arteries are patent without acute thrombus, aneurysm, or dissection.No hemodynamically significant stenosis. Veins: No obvious venous abnormality within the limitations of this arterial phase study. Review of the MIP images confirms the above findings. NON-VASCULAR Lower chest: No focal airspace consolidation or pleural effusion.Posterior bibasilar dependent atelectasis. Mild cardiomegaly. Multi-vessel coronary atherosclerosis. Hepatobiliary: No mass.The gallbladder is distended and fluid-filled without  radiopaque stones. Subtle pericholecystic inflammation of the fat in the porta hepatis. There is also asymmetric enhancement within the adjacent hepatic parenchyma, suggesting hyperemia.No intrahepatic or extrahepatic biliary ductal dilation. Pancreas: Diffuse fatty atrophy of the pancreatic parenchyma. No mass or ductal dilation.No peripancreatic inflammation or fluid collection. Spleen: Normal size. No mass. Adrenals/Urinary Tract: No  adrenal masses. No renal mass. A couple of small nonobstructive right-sided calyceal calculi are present. No hydronephrosis. The urinary bladder is distended without focal abnormality. Stomach/Bowel:Well-positioned percutaneous gastrostomy tube in place. The stomach contains ingested material without focal abnormality. No small bowel wall thickening or inflammation. No small bowel obstruction.Normal appendix. Lymphatic: No intraabdominal or pelvic lymphadenopathy. Reproductive: No prostatomegaly. Brachytherapy seeds filling the prostate parenchyma. No free pelvic fluid. Other: Similar findings of mild mesenteric panniculitis. No pneumoperitoneum or ascites. Musculoskeletal: No acute fracture or destructive lesion.Multilevel degenerative disc disease of the spine. Thoracic DISH. IMPRESSION: VASCULAR 1. No aortic aneurysm or aortic dissection. 2. No acute thrombus within the mesenteric or renal vessels. Moderate narrowing of the IMA ostium from chunky calcified plaque. Otherwise, the celiac and superior mesenteric arteries are widely patent. NON-VASCULAR 1. Fluid-filled, distended gallbladder with pericholecystic inflammation. Asymmetric enhancement is also noted in the adjacent hepatic parenchyma, suggesting hyperemia. These findings are concerning for acute cholecystitis and a nuclear medicine hepatobiliary scan is recommended for further characterization. 2. Couple of small nonobstructive calyceal calculi present in the right kidney. No hydronephrosis. 3. Well-positioned percutaneous gastrostomy tube. Aortic Atherosclerosis (ICD10-I70.0). Electronically Signed   By: Rogelia Myers M.D.   On: 01/22/2024 13:17   Scheduled Meds:  ALPRAZolam   0.5 mg Oral BID   atorvastatin   20 mg Oral Daily   Chlorhexidine  Gluconate Cloth  6 each Topical Daily   doxazosin   1 mg Oral QHS   levothyroxine   88 mcg Oral Daily   pantoprazole   40 mg Oral Daily   sodium chloride  flush  10-40 mL Intracatheter Q12H   Continuous  Infusions:  sodium chloride  125 mL/hr at 01/24/24 0006   piperacillin -tazobactam (ZOSYN )  IV 3.375 g (01/24/24 0556)     LOS: 2 days   Time spent:  Elsie JAYSON Mont[place], DO [facility]  If 7PM-7AM, please contact night-coverage [email or web address]  01/24/2024, 7:31 AM

## 2024-01-24 NOTE — Plan of Care (Signed)

## 2024-01-24 NOTE — Progress Notes (Signed)
   01/24/24 2129  BiPAP/CPAP/SIPAP  BiPAP/CPAP/SIPAP Pt Type Adult  Reason BIPAP/CPAP not in use Non-compliant (Patient continues to refuse CPAP qhs.  Patient encouraged to contact RT should he change his mind.)

## 2024-01-24 NOTE — Transfer of Care (Signed)
 Immediate Anesthesia Transfer of Care Note  Patient: Isaac Carpenter  Procedure(s) Performed: LAPAROSCOPIC CHOLECYSTECTOMY (Abdomen)  Patient Location: PACU  Anesthesia Type:General  Level of Consciousness: drowsy  Airway & Oxygen Therapy: Patient Spontanous Breathing and Patient connected to face mask oxygen  Post-op Assessment: Report given to RN and Post -op Vital signs reviewed and stable  Post vital signs: Reviewed and stable  Last Vitals:  Vitals Value Taken Time  BP 150/65 01/24/24 10:51  Temp 37.2 C 01/24/24 10:51  Pulse 93 01/24/24 10:54  Resp 11 01/24/24 10:54  SpO2 100 % 01/24/24 10:54  Vitals shown include unfiled device data.  Last Pain:  Vitals:   01/24/24 1051  TempSrc:   PainSc: 0-No pain      Patients Stated Pain Goal: 0 (01/22/24 1028)  Complications: No notable events documented.

## 2024-01-24 NOTE — Interval H&P Note (Signed)
 History and Physical Interval Note:  01/24/2024 8:32 AM  Isaac Carpenter  has presented today for surgery, with the diagnosis of Cholecystitis.  The various methods of treatment have been discussed with the patient and family. After consideration of risks, benefits and other options for treatment, the patient has consented to  Procedure(s) with comments: LAPAROSCOPIC CHOLECYSTECTOMY WITH INTRAOPERATIVE CHOLANGIOGRAM (N/A) - Possible Cholangiogram, Possible PEG removal as a surgical intervention.  The patient's history has been reviewed, patient examined, no change in status, stable for surgery.  I have reviewed the patient's chart and labs.  Questions were answered to the patient's satisfaction.     Deward Null III

## 2024-01-24 NOTE — Anesthesia Postprocedure Evaluation (Signed)
 Anesthesia Post Note  Patient: Isaac Carpenter  Procedure(s) Performed: LAPAROSCOPIC CHOLECYSTECTOMY (Abdomen)     Patient location during evaluation: PACU Anesthesia Type: General Level of consciousness: awake and alert Pain management: pain level controlled Vital Signs Assessment: post-procedure vital signs reviewed and stable Respiratory status: spontaneous breathing, nonlabored ventilation and respiratory function stable Cardiovascular status: blood pressure returned to baseline Postop Assessment: no apparent nausea or vomiting Anesthetic complications: no   No notable events documented.  Last Vitals:  Vitals:   01/24/24 1115 01/24/24 1120  BP: 124/63 135/63  Pulse: 94 92  Resp: 19 13  Temp:  36.5 C  SpO2: 100% 100%                Vertell Row

## 2024-01-24 NOTE — Op Note (Signed)
 01/22/2024 - 01/24/2024  10:39 AM  PATIENT:  Isaac Carpenter  73 y.o. male  PRE-OPERATIVE DIAGNOSIS:  Cholecystitis with cholelithiasis  POST-OPERATIVE DIAGNOSIS:  Cholecystitis with cholelithiasis  PROCEDURE:  Procedure(s): LAPAROSCOPIC CHOLECYSTECTOMY (N/A)  SURGEON:  Surgeons and Role:    DEWAINE Curvin Deward DOUGLAS, MD - Primary  PHYSICIAN ASSISTANT:   ASSISTANTS: none   ANESTHESIA:   local and general  EBL:  50 mL   BLOOD ADMINISTERED:none  DRAINS: (1) Blake drain(s) in the gallbladder bed of liver   LOCAL MEDICATIONS USED:  MARCAINE      SPECIMEN:  Source of Specimen:  gallbladder  DISPOSITION OF SPECIMEN:  PATHOLOGY  COUNTS:  YES  TOURNIQUET:  * No tourniquets in log *  DICTATION: .Dragon Dictation    Procedure: After informed consent was obtained the patient was brought to the operating room and placed in the supine position on the operating room table. After adequate induction of general anesthesia the patient's abdomen was prepped with ChloraPrep allowed to dry and draped in usual sterile manner. An appropriate timeout was performed. The area above the umbilicus was infiltrated with quarter percent  Marcaine . A small incision was made with a 15 blade knife. The incision was carried down through the subcutaneous tissue bluntly with a hemostat and [employer] retractors. The linea alba was identified. The linea alba was incised with a 15 blade knife and each side was grasped with Coker clamps. The preperitoneal space was then probed with a hemostat until the peritoneum was opened and access was gained to the abdominal cavity. A 0 Vicryl pursestring stitch was placed in the fascia surrounding the opening. A Hassan cannula was then placed through the opening and anchored in place with the previously placed Vicryl purse string stitch. The abdomen was insufflated with carbon dioxide without difficulty. A laparoscope was inserted through the [facility] cannula in the right upper quadrant was  inspected. Next the epigastric region was infiltrated with % Marcaine . A small incision was made with a 15 blade knife. A 5 mm port was placed bluntly through this incision into the abdominal cavity under direct vision. Next 2 sites were chosen laterally on the right side of the abdomen for placement of 5 mm ports. Each of these areas was infiltrated with quarter percent Marcaine . Small stab incisions were made with a 15 blade knife. 5 mm ports were then placed bluntly through these incisions into the abdominal cavity under direct vision without difficulty.  The gallbladder was too tense and inflamed to grasp.  I was able to aspirate the gallbladder and then grabbed the dome with the lateralmost 5 mm port.  The gallbladder was elevated anteriorly and superiorly. Another blunt grasper was placed through the other 5 mm port and used to retract the body and neck of the gallbladder. A dissector was placed through the epigastric port and using the electrocautery the peritoneal reflection at the gallbladder neck was opened. Blunt dissection was then carried out in this area until the gallbladder neck-cystic duct junction was readily identified and a good critical window was created.  The wall of the gallbladder was necrotic. 2 clips were placed proximally on the cystic duct and 1 distally and the duct was divided between the 2 sets of clips. Posterior to this the cystic artery was identified and again dissected bluntly in a circumferential manner until a good critical window  was created. 2 clips were placed proximally and one distally on the artery and the artery was divided between the 2 sets  of clips. Next a laparoscopic hook cautery device was used to separate the gallbladder from the liver bed. Prior to completely detaching the gallbladder from the liver bed the liver bed was inspected and several small bleeding points were coagulated with the electrocautery until the area was completely hemostatic. The gallbladder  was then detached the rest of it from the liver bed without difficulty. A laparoscopic bag was inserted through the hassan port. The laparoscope was moved to the epigastric port. The gallbladder was placed within the bag and the bag was sealed.  The bag with the gallbladder was then removed with the [employer] cannula through the infraumbilical port without difficulty.  The port was replaced.  The liver bed was coated with Arista.  A 19 French round Blake drain was then brought into the abdominal cavity and out the lateralmost 5 mm port.  The drain was placed in the gallbladder bed of the liver.  The drain was anchored to the skin with a 3-0 nylon stitch.  The Hassan cannula was then removed.  The fascial defect was then closed with the previously placed Vicryl pursestring stitch as well as with another figure-of-eight 0 Vicryl stitch. The liver bed was inspected again and found to be hemostatic. The abdomen was irrigated with copious amounts of saline until the effluent was clear. The ports were then removed under direct vision without difficulty and were found to be hemostatic. The gas was allowed to escape. No other abnormalities were noted on general inspection of the abdomen. The skin incisions were all closed with interrupted 4-0 Monocryl subcuticular stitches. Dermabond dressings and drain dressings were applied. The patient tolerated the procedure well. At the end of the case all needle sponge and instrument counts were correct. The patient was then awakened and taken to recovery in stable condition.  The gastrostomy tube was able to be avoided during the operation  PLAN OF CARE: Admit for overnight observation  PATIENT DISPOSITION:  PACU - hemodynamically stable.   Delay start of Pharmacological VTE agent (>24hrs) due to surgical blood loss or risk of bleeding: no

## 2024-01-25 ENCOUNTER — Encounter (HOSPITAL_COMMUNITY): Payer: Self-pay | Admitting: General Surgery

## 2024-01-25 DIAGNOSIS — K810 Acute cholecystitis: Secondary | ICD-10-CM | POA: Diagnosis not present

## 2024-01-25 LAB — COMPREHENSIVE METABOLIC PANEL WITH GFR
ALT: 27 U/L (ref 0–44)
AST: 57 U/L — ABNORMAL HIGH (ref 15–41)
Albumin: 3.1 g/dL — ABNORMAL LOW (ref 3.5–5.0)
Alkaline Phosphatase: 92 U/L (ref 38–126)
Anion gap: 10 (ref 5–15)
BUN: 18 mg/dL (ref 8–23)
CO2: 25 mmol/L (ref 22–32)
Calcium: 8.7 mg/dL — ABNORMAL LOW (ref 8.9–10.3)
Chloride: 102 mmol/L (ref 98–111)
Creatinine, Ser: 0.45 mg/dL — ABNORMAL LOW (ref 0.61–1.24)
GFR, Estimated: >60 mL/min (ref >60)
Glucose, Bld: 131 mg/dL — ABNORMAL HIGH (ref 70–99)
Potassium: 3.9 mmol/L (ref 3.5–5.1)
Sodium: 137 mmol/L (ref 135–145)
Total Bilirubin: 0.7 mg/dL (ref 0.0–1.2)
Total Protein: 5.5 g/dL — ABNORMAL LOW (ref 6.5–8.1)

## 2024-01-25 LAB — GLUCOSE, CAPILLARY
Glucose-Capillary: 124 mg/dL — ABNORMAL HIGH (ref 70–99)
Glucose-Capillary: 127 mg/dL — ABNORMAL HIGH (ref 70–99)
Glucose-Capillary: 133 mg/dL — ABNORMAL HIGH (ref 70–99)
Glucose-Capillary: 143 mg/dL — ABNORMAL HIGH (ref 70–99)

## 2024-01-25 LAB — SURGICAL PATHOLOGY

## 2024-01-25 MED ORDER — FESOTERODINE FUMARATE ER 4 MG PO TB24
4.0000 mg | ORAL_TABLET | Freq: Every day | ORAL | Status: DC
  Administered 2024-01-25 – 2024-01-26 (×2): 4 mg via ORAL
  Filled 2024-01-25 (×2): qty 1

## 2024-01-25 MED ORDER — ENSURE PLUS HIGH PROTEIN PO LIQD
237.0000 mL | Freq: Two times a day (BID) | ORAL | Status: DC
  Administered 2024-01-25 – 2024-01-26 (×2): 237 mL via ORAL

## 2024-01-25 NOTE — Addendum Note (Signed)
 Addendum  created 01/25/24 1056 by Paul Lamarr BRAVO, MD   Intraprocedure Event edited, Intraprocedure Meds edited, Intraprocedure Staff edited

## 2024-01-25 NOTE — Progress Notes (Signed)
   01/25/24 1221  TOC Brief Assessment  Insurance and Status Reviewed  Patient has primary care physician Yes  Home environment has been reviewed resides in private residence  Prior level of function: Independent  Prior/Current Home Services No current home services  Social Drivers of Health Review SDOH reviewed no interventions necessary  Readmission risk has been reviewed Yes  Transition of care needs no transition of care needs at this time

## 2024-01-25 NOTE — Progress Notes (Signed)
 Progress Note  1 Day Post-Op  Subjective: Pt has not had much to eat/drink yet. Pain control improving. Wife at bedside.   Objective: Vital signs in last 24 hours: Temp:  [97.3 F (36.3 C)-98.6 F (37 C)] 97.5 F (36.4 C) (10/07 0636) Pulse Rate:  [62-81] 62 (10/07 0636) Resp:  [16-18] 16 (10/06 2041) BP: (101-138)/(47-70) 104/47 (10/07 0636) SpO2:  [97 %-100 %] 98 % (10/07 0636) Last BM Date : 01/21/24  Intake/Output from previous day: 10/06 0701 - 10/07 0700 In: 3157.8 [P.O.:180; I.V.:2837.5; IV Piggyback:140.3] Out: 620 [Urine:500; Drains:70; Blood:50] Intake/Output this shift: No intake/output data recorded.  PE: General: pleasant, WD, obese male who is laying in bed in NAD HEENT: sclera anicteric  Heart: regular, rate, and rhythm.   Lungs: Respiratory effort nonlabored Abd: soft, mild distention, appropriately ttp, PEG present, incisions C/D/I, JP with SS fluid   Lab Results:  Recent Labs    01/23/24 0253 01/24/24 0423  WBC 6.5 7.8  HGB 9.8* 9.5*  HCT 31.0* 29.0*  PLT 138* 144*   BMET Recent Labs    01/24/24 0423 01/25/24 0535  NA 135 137  K 3.4* 3.9  CL 101 102  CO2 25 25  GLUCOSE 108* 131*  BUN 14 18  CREATININE 0.47* 0.45*  CALCIUM  8.6* 8.7*   PT/INR No results for input(s): LABPROT, INR in the last 72 hours. CMP     Component Value Date/Time   NA 137 01/25/2024 0535   K 3.9 01/25/2024 0535   CL 102 01/25/2024 0535   CO2 25 01/25/2024 0535   GLUCOSE 131 (H) 01/25/2024 0535   BUN 18 01/25/2024 0535   CREATININE 0.45 (L) 01/25/2024 0535   CREATININE 0.59 (L) 01/21/2024 0821   CREATININE 0.72 01/31/2020 1129   CALCIUM  8.7 (L) 01/25/2024 0535   PROT 5.5 (L) 01/25/2024 0535   ALBUMIN 3.1 (L) 01/25/2024 0535   AST 57 (H) 01/25/2024 0535   AST 16 01/21/2024 0821   ALT 27 01/25/2024 0535   ALT 11 01/21/2024 0821   ALKPHOS 92 01/25/2024 0535   BILITOT 0.7 01/25/2024 0535   BILITOT 1.2 01/21/2024 0821   GFRNONAA >60 01/25/2024  0535   GFRNONAA >60 01/21/2024 0821   GFRNONAA 96 01/31/2020 1129   GFRAA 111 01/31/2020 1129   Lipase     Component Value Date/Time   LIPASE <10 (L) 01/22/2024 1201       Studies/Results: NM Hepatobiliary Liver Func Result Date: 01/24/2024 EXAM: NM HEPATOBILLARY SCAN 01/23/2024 05:41:32 PM TECHNIQUE: RADIOPHARMACEUTICAL: 5.19 mCi Tc-62m mebrofenin Dynamic images of the abdomen and pelvis were obtained in the anterior projection for 1 hour after intravenous administration of radiopharmaceutical. COMPARISON: None available. CLINICAL HISTORY: Cholecystitis; has PEG, recent radiaition and chemotherapy for tonsil FINDINGS: Homogenous uptake within the liver. Normal clearance of the blood pool. Appropriate excretion into the biliary system. Activity is visualized in the small bowel. After 1 hour of imaging, there was no gallbladder activity visualized. Imaging was performed for an additional 1 hour without gallbladder activity. IMPRESSION: 1. No gallbladder activity visualized through 2 hours. Imaging findings are compatible with cholecystitis. Electronically signed by: Waddell Calk MD 01/24/2024 05:54 AM EDT RP Workstation: [id number]   DG CHEST PORT 1 VIEW Result Date: 01/24/2024 EXAM: 1 VIEW(S) XRAY OF THE CHEST 01/23/2024 11:44:00 PM COMPARISON: 01/19/2024 CLINICAL HISTORY: Port-A-Cath in place [id number]. Difficulty with Port-A-Cath use today. FINDINGS: LINES, TUBES AND DEVICES: Right CT Port-A-Cath in place with tip at cavoatrial junction, unchanged. LUNGS AND PLEURA: No focal  pulmonary opacity. No pulmonary edema. No pleural effusion. No pneumothorax. HEART AND MEDIASTINUM: Stable mild cardiomegaly. No acute abnormality of the mediastinal silhouette. BONES AND SOFT TISSUES: No acute osseous abnormality. IMPRESSION: 1. Right CT Port-A-Cath in place with tip at the cavoatrial junction. 2. Stable mild cardiomegaly. Electronically signed by: Franky Stanford MD 01/24/2024 12:11 AM EDT RP Workstation:  [id number]    Anti-infectives: Anti-infectives (From admission, onward)    Start     Dose/Rate Route Frequency Ordered Stop   01/22/24 2200  piperacillin -tazobactam (ZOSYN ) IVPB 3.375 g  Status:  Discontinued        3.375 g 100 mL/hr over 30 Minutes Intravenous Every 8 hours 01/22/24 1406 01/22/24 1408   01/22/24 2200  piperacillin -tazobactam (ZOSYN ) IVPB 3.375 g        3.375 g 12.5 mL/hr over 240 Minutes Intravenous Every 8 hours 01/22/24 1408     01/22/24 1330  piperacillin -tazobactam (ZOSYN ) IVPB 3.375 g        3.375 g 100 mL/hr over 30 Minutes Intravenous  Once 01/22/24 1321 01/22/24 1424        Assessment/Plan Acute cholecystitis  POD1 s/p laparoscopic cholecystectomy Dr. Curvin  - ok for diet as tolerated, ordered ensure - continue drain - SS currently  - will confirm with MD if further abx needed - should be fine to transition to PO if so - mobilize  - ok for home later today vs tomorrow pending PO intake  - would hold on PEG removal for now but can remove in office at post-op follow up if continuing to do well from PO standpoint   FEN: soft diet VTE: ok for LMWH or SQH ID: Zosyn  10/4>>  - per TRH -  T2DM Class I obesity Anxiety OA BPH Prostate cancer Laryngeal cancer s/p PEG placement  Chemo induced neutropenia HTN Hypothyroidism  OSA    LOS: 3 days      Burnard JONELLE Louder, [facility] 01/25/2024, 12:47 PM Please see Amion for pager number during day hours 7:00am-4:30pm

## 2024-01-25 NOTE — Progress Notes (Addendum)
 PROGRESS NOTE    TYRIS ELIOT  FMW:[id number] DOB: [birth year] DOA: 01/22/2024 PCP: [name], [name], MD   Brief Narrative:  Isaac Carpenter is a 72 y.o. male with medical history significant of type 2 diabetes, anxiety, class I obesity, osteoarthritis, BPH, prostate cancer, tonsillar fossa cancer, history of PEG placement(currently being recommended for removal), chemotherapy-induced neutropenia, febrile neutropenia who presents with worsening abdominal pain and constipation.  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 based on imaging but discharged given resolution of symptoms at that time  Assessment & Plan:   Principal Problem:   Acute cholecystitis Active Problems:   Normocytic anemia   S/P percutaneous endoscopic gastrostomy (PEG) tube placement (HCC)   Hypothyroidism, postsurgical   Hypertension, essential, benign   Hyperlipidemia   OSA (obstructive sleep apnea)   Type 2 diabetes mellitus (HCC)   Intractable abdominal pain  Acute cholecystitis, questionably secondary to gallstone/obstruction - Ultrasound notable for multiple gallstones without sonographic evidence of cholecystitis  - HIDA scan consistent with cholecystitis, tolerated lap chole 01/24/2024 well without complication - General Surgery following, appreciate insight and recommendations - Stop antibiotics given source control (lap chole) - Advance diet postoperatively, currently on soft diet, JP drain serosanguineous fluid, incision sites appear well-healed - Postoperative pain control per general surgery  Normocytic anemia -stable, currently at baseline. -Continue to follow, no signs symptoms of bleeding at this time  S/P percutaneous endoscopic gastrostomy tube placement (HCC) Recommended for removal in the outpatient setting   Hypothyroidism, postsurgical Continue levothyroxine , if unable to take p.o. safely will administer IV.   Hypertension,  essential, benign Hold scheduled doxazosin  at this time, as needed hydralazine ordered   Hyperlipidemia Hold statin until PO intake improves OSA (obstructive sleep apnea)  CPAP at bedtime. Type 2 diabetes mellitus, diet controlled - Advance diet as above -no indication for sliding scale insulin  at this time given well-controlled glucose  DVT prophylaxis: SCDs Start: 01/22/24 1406 Code Status:   Code Status: Full Code Family Communication: Wife at bedside  Status is: Patient  Dispo: The patient is from: Home              Anticipated d/c is to: Home              Anticipated d/c date is: 24-48 hours              Patient currently not medically stable for discharge until he can tolerate PO more appropriately  Consultants:  General Surgery  Procedures:  Pending above  Antimicrobials:  Zosyn   Subjective: No acute issues or events overnight, denies nausea vomiting diarrhea constipation headache fevers chills or chest pain.  Objective: Vitals:   01/24/24 1736 01/24/24 2041 01/25/24 0230 01/25/24 0636  BP: (!) 101/57 135/70 119/68 (!) 104/47  Pulse: 68 71 78 62  Resp: 18 16    Temp: 98.6 F (37 C) 97.8 F (36.6 C) (!) 97.3 F (36.3 C) (!) 97.5 F (36.4 C)  TempSrc: Oral Oral Oral Oral  SpO2: 99% 100% 97% 98%  Weight:      Height:        Intake/Output Summary (Last 24 hours) at 01/25/2024 0737 Last data filed at 01/25/2024 0600 Gross per 24 hour  Intake 3157.84 ml  Output 620 ml  Net 2537.84 ml   Filed Weights   01/22/24 1018  Weight: 112 kg    Examination:  General:  Pleasantly resting in bed, No acute distress. HEENT:  Normocephalic  atraumatic.  Sclerae nonicteric, noninjected.  Extraocular movements intact bilaterally. Neck:  Without mass or deformity.  Trachea is midline. Lungs/chest:  Clear to auscultate bilaterally without rhonchi, wheeze, or rales.  Right port noted Heart:  Regular rate and rhythm.  Without murmurs, rubs, or gallops. Abdomen:  Soft,  nontender, nondistended.  PEG tube noted. Extremities: Without cyanosis, clubbing, edema, or obvious deformity. Skin:  Warm and dry, no erythema.  Data Reviewed: I have personally reviewed following labs and imaging studies  CBC: Recent Labs  Lab 01/19/24 0521 01/21/24 0821 01/22/24 1201 01/23/24 0253 01/24/24 0423  WBC 5.8 5.9 5.8 6.5 7.8  NEUTROABS  --  4.7  --   --   --   HGB 10.9* 10.9* 10.2* 9.8* 9.5*  HCT 35.1* 32.4* 32.3* 31.0* 29.0*  MCV 98.9 94.7 97.6 98.4 97.0  PLT 143* 165 150 138* 144*   Basic Metabolic Panel: Recent Labs  Lab 01/21/24 0821 01/22/24 1201 01/23/24 0253 01/24/24 0423 01/25/24 0535  NA 138 137 137 135 137  K 3.7 3.6 3.7 3.4* 3.9  CL 102 101 101 101 102  CO2 32 27 26 25 25   GLUCOSE 144* 144* 142* 108* 131*  BUN 20 20 15 14 18   CREATININE 0.59* 0.48* 0.44* 0.47* 0.45*  CALCIUM  9.3 9.1 8.8* 8.6* 8.7*  MG 2.0  --   --   --   --    GFR: Estimated Creatinine Clearance: 109.4 mL/min (A) (by C-G formula based on SCr of 0.45 mg/dL (L)). Liver Function Tests: Recent Labs  Lab 01/21/24 0821 01/22/24 1201 01/23/24 0253 01/24/24 0423 01/25/24 0535  AST 16 22 30 28  57*  ALT 11 12 16 15 27   ALKPHOS 71 80 105 100 92  BILITOT 1.2 1.0 1.0 1.2 0.7  PROT 6.5 6.1* 5.7* 5.5* 5.5*  ALBUMIN 3.6 3.6 3.3* 3.1* 3.1*   Recent Labs  Lab 01/19/24 0139 01/22/24 1201  LIPASE 19 <10*   CBG: Recent Labs  Lab 01/24/24 0909 01/24/24 1054 01/24/24 1731 01/25/24 0006 01/25/24 0634  GLUCAP 121* 137* 154* 143* 133*   Sepsis Labs: Recent Labs  Lab 01/19/24 0206 01/22/24 1347  LATICACIDVEN 1.3 1.0   No results found for this or any previous visit (from the past 240 hours).   Radiology Studies: NM Hepatobiliary Liver Func Result Date: 01/24/2024 EXAM: NM HEPATOBILLARY SCAN 01/23/2024 05:41:32 PM TECHNIQUE: RADIOPHARMACEUTICAL: 5.19 mCi Tc-1m mebrofenin Dynamic images of the abdomen and pelvis were obtained in the anterior projection for 1 hour after  intravenous administration of radiopharmaceutical. COMPARISON: None available. CLINICAL HISTORY: Cholecystitis; has PEG, recent radiaition and chemotherapy for tonsil FINDINGS: Homogenous uptake within the liver. Normal clearance of the blood pool. Appropriate excretion into the biliary system. Activity is visualized in the small bowel. After 1 hour of imaging, there was no gallbladder activity visualized. Imaging was performed for an additional 1 hour without gallbladder activity. IMPRESSION: 1. No gallbladder activity visualized through 2 hours. Imaging findings are compatible with cholecystitis. Electronically signed by: Waddell Calk MD 01/24/2024 05:54 AM EDT RP Workstation: [id number]   DG CHEST PORT 1 VIEW Result Date: 01/24/2024 EXAM: 1 VIEW(S) XRAY OF THE CHEST 01/23/2024 11:44:00 PM COMPARISON: 01/19/2024 CLINICAL HISTORY: Port-A-Cath in place [id number]. Difficulty with Port-A-Cath use today. FINDINGS: LINES, TUBES AND DEVICES: Right CT Port-A-Cath in place with tip at cavoatrial junction, unchanged. LUNGS AND PLEURA: No focal pulmonary opacity. No pulmonary edema. No pleural effusion. No pneumothorax. HEART AND MEDIASTINUM: Stable mild cardiomegaly. No acute abnormality of the mediastinal silhouette.  BONES AND SOFT TISSUES: No acute osseous abnormality. IMPRESSION: 1. Right CT Port-A-Cath in place with tip at the cavoatrial junction. 2. Stable mild cardiomegaly. Electronically signed by: Franky Stanford MD 01/24/2024 12:11 AM EDT RP Workstation: [id number]   Scheduled Meds:  ALPRAZolam   0.5 mg Oral BID   atorvastatin   20 mg Oral Daily   Chlorhexidine  Gluconate Cloth  6 each Topical Daily   doxazosin   1 mg Oral QHS   levothyroxine   88 mcg Oral Daily   pantoprazole   40 mg Oral Daily   sodium chloride  flush  10-40 mL Intracatheter Q12H   Continuous Infusions:  sodium chloride  125 mL/hr at 01/24/24 1327   piperacillin -tazobactam (ZOSYN )  IV 3.375 g (01/25/24 0452)     LOS: 3 days   Time spent:   Elsie JAYSON Montclair, DO [facility]  If 7PM-7AM, please contact night-coverage [email or web address]  01/25/2024, 7:37 AM

## 2024-01-25 NOTE — Progress Notes (Signed)
   01/25/24 2125  BiPAP/CPAP/SIPAP  BiPAP/CPAP/SIPAP Pt Type Adult  Reason BIPAP/CPAP not in use Non-compliant (Patient continues to refuse CPAP qhs.)

## 2024-01-25 NOTE — Discharge Instructions (Signed)

## 2024-01-26 ENCOUNTER — Encounter (HOSPITAL_COMMUNITY): Payer: Self-pay | Admitting: Internal Medicine

## 2024-01-26 DIAGNOSIS — E119 Type 2 diabetes mellitus without complications: Secondary | ICD-10-CM

## 2024-01-26 DIAGNOSIS — E890 Postprocedural hypothyroidism: Secondary | ICD-10-CM

## 2024-01-26 DIAGNOSIS — I10 Essential (primary) hypertension: Secondary | ICD-10-CM

## 2024-01-26 DIAGNOSIS — Z931 Gastrostomy status: Secondary | ICD-10-CM

## 2024-01-26 DIAGNOSIS — G4733 Obstructive sleep apnea (adult) (pediatric): Secondary | ICD-10-CM | POA: Diagnosis not present

## 2024-01-26 DIAGNOSIS — E785 Hyperlipidemia, unspecified: Secondary | ICD-10-CM

## 2024-01-26 DIAGNOSIS — K810 Acute cholecystitis: Secondary | ICD-10-CM | POA: Diagnosis not present

## 2024-01-26 DIAGNOSIS — D649 Anemia, unspecified: Secondary | ICD-10-CM

## 2024-01-26 LAB — COMPREHENSIVE METABOLIC PANEL WITH GFR
ALT: 30 U/L (ref 0–44)
AST: 55 U/L — ABNORMAL HIGH (ref 15–41)
Albumin: 2.8 g/dL — ABNORMAL LOW (ref 3.5–5.0)
Alkaline Phosphatase: 89 U/L (ref 38–126)
Anion gap: 7 (ref 5–15)
BUN: 18 mg/dL (ref 8–23)
CO2: 27 mmol/L (ref 22–32)
Calcium: 8.3 mg/dL — ABNORMAL LOW (ref 8.9–10.3)
Chloride: 105 mmol/L (ref 98–111)
Creatinine, Ser: 0.47 mg/dL — ABNORMAL LOW (ref 0.61–1.24)
GFR, Estimated: >60 mL/min (ref >60)
Glucose, Bld: 114 mg/dL — ABNORMAL HIGH (ref 70–99)
Potassium: 3.6 mmol/L (ref 3.5–5.1)
Sodium: 139 mmol/L (ref 135–145)
Total Bilirubin: 0.3 mg/dL (ref 0.0–1.2)
Total Protein: 5.2 g/dL — ABNORMAL LOW (ref 6.5–8.1)

## 2024-01-26 LAB — CBC
HCT: 27.9 % — ABNORMAL LOW (ref 39.0–52.0)
Hemoglobin: 8.8 g/dL — ABNORMAL LOW (ref 13.0–17.0)
MCH: 31.2 pg (ref 26.0–34.0)
MCHC: 31.5 g/dL (ref 30.0–36.0)
MCV: 98.9 fL (ref 80.0–100.0)
Platelets: 171 K/uL (ref 150–400)
RBC: 2.82 MIL/uL — ABNORMAL LOW (ref 4.22–5.81)
RDW: 13.2 % (ref 11.5–15.5)
WBC: 4.1 K/uL (ref 4.0–10.5)
nRBC: 0 % (ref 0.0–0.2)

## 2024-01-26 LAB — GLUCOSE, CAPILLARY
Glucose-Capillary: 103 mg/dL — ABNORMAL HIGH (ref 70–99)
Glucose-Capillary: 103 mg/dL — ABNORMAL HIGH (ref 70–99)

## 2024-01-26 MED ORDER — ONDANSETRON HCL 4 MG PO TABS: 4.0000 mg | ORAL_TABLET | Freq: Four times a day (QID) | ORAL | 0 refills | Status: AC | PRN

## 2024-01-26 MED ORDER — ASPIRIN 81 MG PO CHEW: 81.0000 mg | CHEWABLE_TABLET | Freq: Every day | ORAL | Status: AC

## 2024-01-26 MED ORDER — HYDROMORPHONE HCL 1 MG/ML IJ SOLN: 0.5000 mg | INTRAMUSCULAR | Status: DC | PRN

## 2024-01-26 MED ORDER — HEPARIN SOD (PORK) LOCK FLUSH 100 UNIT/ML IV SOLN
500.0000 [IU] | Freq: Once | INTRAVENOUS | Status: AC
  Administered 2024-01-26: 500 [IU] via INTRAVENOUS
  Filled 2024-01-26: qty 5

## 2024-01-26 MED ORDER — OXYCODONE HCL 5 MG PO TABS
5.0000 mg | ORAL_TABLET | ORAL | Status: DC | PRN
  Administered 2024-01-26: 5 mg via ORAL
  Filled 2024-01-26: qty 1

## 2024-01-26 MED ORDER — ACETAMINOPHEN 325 MG PO TABS: 650.0000 mg | ORAL_TABLET | Freq: Four times a day (QID) | ORAL | Status: AC | PRN

## 2024-01-26 MED ORDER — OXYCODONE HCL 5 MG PO TABS: 2.5000 mg | ORAL_TABLET | ORAL | 0 refills | Status: AC | PRN

## 2024-01-26 MED ORDER — ENSURE PLUS HIGH PROTEIN PO LIQD: 237.0000 mL | Freq: Two times a day (BID) | ORAL | Status: AC

## 2024-01-26 NOTE — Progress Notes (Signed)
 Progress Note  2 Days Post-Op  Subjective: Pt taking in a little more PO, drank some ensure yesterday. Still having some RUQ soreness but overall improving. Discussed plans for drain with wife and she is in agreement  Objective: Vital signs in last 24 hours: Temp:  [97.6 F (36.4 C)-97.7 F (36.5 C)] 97.6 F (36.4 C) (10/08 0618) Pulse Rate:  [61-64] 64 (10/08 0618) Resp:  [14-16] 16 (10/08 0618) BP: (117-119)/(55-57) 119/57 (10/08 0618) SpO2:  [95 %-97 %] 96 % (10/08 0618) Last BM Date : 01/19/24  Intake/Output from previous day: 10/07 0701 - 10/08 0700 In: 1122.2 [P.O.:250; I.V.:790.4; IV Piggyback:81.8] Out: 360 [Urine:300; Drains:60] Intake/Output this shift: Total I/O In: -  Out: 150 [Urine:150]  PE: General: pleasant, WD, obese male who is laying in bed in NAD HEENT: sclera anicteric  Heart: regular, rate, and rhythm.   Lungs: Respiratory effort nonlabored Abd: soft, mild distention, appropriately ttp, PEG present, incisions C/D/I, JP with SS fluid   Lab Results:  Recent Labs    01/24/24 0423 01/26/24 0408  WBC 7.8 4.1  HGB 9.5* 8.8*  HCT 29.0* 27.9*  PLT 144* 171   BMET Recent Labs    01/25/24 0535 01/26/24 0408  NA 137 139  K 3.9 3.6  CL 102 105  CO2 25 27  GLUCOSE 131* 114*  BUN 18 18  CREATININE 0.45* 0.47*  CALCIUM  8.7* 8.3*   PT/INR No results for input(s): LABPROT, INR in the last 72 hours. CMP     Component Value Date/Time   NA 139 01/26/2024 0408   K 3.6 01/26/2024 0408   CL 105 01/26/2024 0408   CO2 27 01/26/2024 0408   GLUCOSE 114 (H) 01/26/2024 0408   BUN 18 01/26/2024 0408   CREATININE 0.47 (L) 01/26/2024 0408   CREATININE 0.59 (L) 01/21/2024 0821   CREATININE 0.72 01/31/2020 1129   CALCIUM  8.3 (L) 01/26/2024 0408   PROT 5.2 (L) 01/26/2024 0408   ALBUMIN 2.8 (L) 01/26/2024 0408   AST 55 (H) 01/26/2024 0408   AST 16 01/21/2024 0821   ALT 30 01/26/2024 0408   ALT 11 01/21/2024 0821   ALKPHOS 89 01/26/2024 0408    BILITOT 0.3 01/26/2024 0408   BILITOT 1.2 01/21/2024 0821   GFRNONAA >60 01/26/2024 0408   GFRNONAA >60 01/21/2024 0821   GFRNONAA 96 01/31/2020 1129   GFRAA 111 01/31/2020 1129   Lipase     Component Value Date/Time   LIPASE <10 (L) 01/22/2024 1201       Studies/Results: No results found.   Anti-infectives: Anti-infectives (From admission, onward)    Start     Dose/Rate Route Frequency Ordered Stop   01/22/24 2200  piperacillin -tazobactam (ZOSYN ) IVPB 3.375 g  Status:  Discontinued        3.375 g 100 mL/hr over 30 Minutes Intravenous Every 8 hours 01/22/24 1406 01/22/24 1408   01/22/24 2200  piperacillin -tazobactam (ZOSYN ) IVPB 3.375 g  Status:  Discontinued        3.375 g 12.5 mL/hr over 240 Minutes Intravenous Every 8 hours 01/22/24 1408 01/25/24 1345   01/22/24 1330  piperacillin -tazobactam (ZOSYN ) IVPB 3.375 g        3.375 g 100 mL/hr over 30 Minutes Intravenous  Once 01/22/24 1321 01/22/24 1424        Assessment/Plan Acute cholecystitis  POD2 s/p laparoscopic cholecystectomy Dr. Curvin  - ok for diet as tolerated, ordered ensure - continue drain - SS currently  - no further PO abx needed from  a surgical standpoint - mobilize  - stable for DC from surgery perspective  - would hold on PEG removal for now but can remove in office at post-op follow up if continuing to do well from PO standpoint   FEN: soft diet VTE: ok for LMWH or SQH ID: Zosyn  10/4>10/7  - per TRH -  T2DM Class I obesity Anxiety OA BPH Prostate cancer Laryngeal cancer s/p PEG placement  Chemo induced neutropenia HTN Hypothyroidism  OSA    LOS: 4 days      Burnard JONELLE Louder, [facility] 01/26/2024, 8:57 AM Please see Amion for pager number during day hours 7:00am-4:30pm

## 2024-01-26 NOTE — Progress Notes (Signed)
 Discharge instructions given to patient and wife, including JP drain care. Questions asked and answered.

## 2024-01-26 NOTE — Plan of Care (Signed)

## 2024-01-26 NOTE — Discharge Summary (Signed)
 Physician Discharge Summary  Isaac Carpenter FMW:[id number] DOB: 02-07-1951 DOA: 01/22/2024  PCP: Carpenter, Isaac G, MD  Admit date: 01/22/2024 Discharge date: 01/26/2024  Time spent: 60 minutes  Recommendations for Outpatient Follow-up:  Follow-up with [facility] on 02/01/2024, RN visit for drain removal. Follow-up with Dr. Curvin III on 02/17/2024 for postop follow-up and probable PEG removal. Follow-up with Carpenter, Isaac G, MD in 3 to 4 weeks.  On follow-up patient will need a basic metabolic profile done to follow-up on electrolytes and renal function.  Patient will need a CBC done to follow-up on counts.   Discharge Diagnoses:  Principal Problem:   Acute cholecystitis Active Problems:   Normocytic anemia   S/P percutaneous endoscopic gastrostomy (PEG) tube placement (HCC)   Hypothyroidism, postsurgical   Hypertension, essential, benign   Hyperlipidemia   OSA (obstructive sleep apnea)   Type 2 diabetes mellitus (HCC)   Discharge Condition: Stable and improved.  Diet recommendation: Heart healthy  Filed Weights   01/22/24 1018  Weight: 112 kg    History of present illness:  HPI per Dr. Celinda Ozell [name] is a 73 y.o. male with medical history significant of type 2 diabetes, anxiety, class I obesity, osteoarthritis, BPH, prostate cancer, tonsillar fossa cancer, history of PEG placement, chemotherapy-induced neutropenia, febrile neutropenia who was recently seen in the hospital due to abdominal pain 3 days ago for what was thought to be mesenteric panniculitis and is now returning due to persistent pain despite his constipation having resolved after he took magnesium  citrate.  No emesis, diarrhea, melena or hematochezia.  No flank pain, dysuria, frequency or hematuria.  His appetite is decreased. He denied fever, chills, rhinorrhea, sore throat, wheezing or hemoptysis.  No chest pain, palpitations, diaphoresis, PND, orthopnea or pitting edema of the lower extremities.   No polyuria, polydipsia, polyphagia or blurred vision.    Labwork: Urinalysis shows specific gravity greater than 1.046, but was otherwise normal.  CBC showed a white count of 5.8, hemoglobin 10.2 g/dL and platelets 849.  CMP showed normal electrolytes, normal LFTs with the exception of total protein of 6.1 g/dL, glucose 855, BUN 20 and creatinine 0.48 mg/dL.  Lactic acid was normal.   Imaging: CTA abdomen/pelvis showing a fluid-filled, distended gallbladder with pericholecystic inflammation.  There is a symmetric enhancement of the adjacent hepatic parenchyma, suggesting hyperemia.  Findings concerning for acute cholecystitis.  HIDA scan recommended.  Nonobstructive calyceal calculi in the right kidney.  Posterior bibasilar dependent atelectasis.  Mild cardiomegaly.  Multivessel coronary and aortic arthrosclerosis.  Well-positioned PEG.   ED course: Initial vital signs were temperature 98.7 F, pulse 87, respiration 18, BP 105/78 mmHg O2 sat 97% on room air.  The patient received hydromorphone 1 mg IVP, NS 1000 mL liter bolus and Zosyn  3.375 g IVPB x 1.   Hospital Course:  #1 acute on chronic cholecystitis with cholelithiasis -Patient noted to have presented with persistent right upper quadrant abdominal pain. - CT angiogram abdomen and pelvis showed a fluid-filled distended gallbladder with pericholeystic inflammation with systemic enhancement of adjacent hepatic parenchyma suggesting hyperemia.  Findings concerning for acute cholecystitis. -Right upper quadrant ultrasound done with multiple gallstones without sonographic evidence of cholecystitis. - HIDA scan done consistent with cholecystitis. - Patient seen in consultation by general surgery and patient subsequently underwent a laparoscopic cholecystectomy 01/24/2024 with drain placement without any complications. - Patient initially was empirically on IV antibiotics which was discontinued.  General surgery recommendations post cholecystectomy. -  Patient was started on  a diet diet advanced to a soft diet which she tolerated. - Patient's pain was controlled during the hospitalization and patient cleared by general surgery for discharge. - Outpatient follow-up with general surgery.  2.  Normocytic anemia -Remained stable. - Outpatient follow-up.  3.  Status post percutaneous endoscopic gastrostomy tube placement -Outpatient follow-up with general surgery to determine timing of removal if oral intake continues to improve which will be done in the outpatient setting.  4.  Hypothyroidism -Patient maintained on home regimen levothyroxine .  5.  Hypertension -Remained stable during the hospitalization.  6.  Hyperlipidemia -Statin held during the hospitalization will be resumed on discharge.  7.  OSA -Patient maintained on CPAP nightly.  8.  Diabetes mellitus type 2, diet controlled -Blood glucose levels were well-controlled during the hospitalization. - Outpatient follow-up with PCP.  Procedures: CT angiogram abdomen and pelvis 01/22/2024 Chest x-ray 01/23/2024 Right upper quadrant ultrasound 01/22/2024 HIDA scan 01/23/2024. Laparoscopic cholecystectomy with drain placement.  General surgery: Dr. Curvin III  Consultations: General Surgery: Dr. Vernetta 01/22/2024  Discharge Exam: Vitals:   01/26/24 0618 01/26/24 1420  BP: (!) 119/57 136/73  Pulse: 64 67  Resp: 16 18  Temp: 97.6 F (36.4 C) 97.6 F (36.4 C)  SpO2: 96% 97%    General: NAD Cardiovascular: Regular rate rhythm no murmurs rubs or gallops.  No JVD.  No lower extremity edema. Respiratory: Clear to auscultation bilaterally.  No wheezes, no crackles, no rhonchi.  Fair air movement.  Speaking in full sentences. GI: Abdomen is obese, soft, nontender to palpation, positive bowel sounds.  No rebound.  No guarding.  JP drain in place with sanguinous drainage.  PEG tube in place.  Discharge Instructions   Discharge Instructions     Diet - low sodium heart healthy    Complete by: As directed    Increase activity slowly   Complete by: As directed    No wound care   Complete by: As directed       Allergies as of 01/26/2024       Reactions   Morphine     Hallucinations   Other Other (See Comments)   Tape used when putting in the port - caused welting   Testosterone Other (See Comments)   headache        Medication List     TAKE these medications    acetaminophen  325 MG tablet Commonly known as: TYLENOL  Take 2 tablets (650 mg total) by mouth every 6 (six) hours as needed for mild pain (pain score 1-3) or fever (or Fever >/= 101).   ALPRAZolam  1 MG tablet Commonly known as: XANAX  TAKE ONE-HALF TABLET BY MOUTH TWO TIMES DAILY AS NEEDED FOR ANXIETY What changed: See the new instructions.   aspirin 81 MG chewable tablet Chew 1 tablet (81 mg total) by mouth daily. Start taking on: February 02, 2024 What changed: These instructions start on February 02, 2024. If you are unsure what to do until then, ask your doctor or other care provider.   atorvastatin  20 MG tablet Commonly known as: LIPITOR Take 20 mg by mouth daily.   doxazosin  1 MG tablet Commonly known as: CARDURA  TAKE ONE TABLET BY MOUTH AT BEDTIME   feeding supplement Liqd Take 237 mLs by mouth 2 (two) times daily between meals.   levothyroxine  88 MCG tablet Commonly known as: SYNTHROID  TAKE ONE TABLET BY MOUTH DAILY.   ondansetron  4 MG tablet Commonly known as: ZOFRAN  Take 1 tablet (4 mg total) by mouth every 6 (  six) hours as needed for nausea.   oxyCODONE  5 MG immediate release tablet Commonly known as: Oxy IR/ROXICODONE  Take 0.5-1 tablets (2.5-5 mg total) by mouth every 4 (four) hours as needed for severe pain (pain score 7-10) or moderate pain (pain score 4-6). What changed:  how much to take reasons to take this   pantoprazole  40 MG tablet Commonly known as: PROTONIX  Take 40 mg by mouth daily.   solifenacin 5 MG tablet Commonly known as: VESICARE Take 5 mg  by mouth daily.        Allergies  Allergen Reactions   Morphine      Hallucinations   Other Other (See Comments)    Tape used when putting in the port - caused welting   Testosterone Other (See Comments)    headache    Follow-up Information     [facility], [facility]. Go on 02/01/2024.   Specialty: General Surgery Why: RN visit for drain removal at 2:00 PM, please arrive 30 min prior to appointment time to check in. Contact information: [address] [place] [address] [phone]         Curvin Deward MOULD, MD. Go on 02/17/2024.   Specialty: General Surgery Why: 10:40 AM for post op follow up and probable PEG removal. Please arrive 15 min prior to appointment time to check in. Contact information: [address] [place] [address] [phone]         Carpenter, Isaac G, MD. Schedule an appointment as soon as possible for a visit in 3 week(s).   Specialty: Family Medicine Why: Follow-up in 3 to 4 weeks. Contact information: [address] [place] [address] [phone]                  The results of significant diagnostics from this hospitalization (including imaging, microbiology, ancillary and laboratory) are listed below for reference.    Significant Diagnostic Studies: NM Hepatobiliary Liver Func Result Date: 01/24/2024 EXAM: NM HEPATOBILLARY SCAN 01/23/2024 05:41:32 PM TECHNIQUE: RADIOPHARMACEUTICAL: 5.19 mCi Tc-74m mebrofenin Dynamic images of the abdomen and pelvis were obtained in the anterior projection for 1 hour after intravenous administration of radiopharmaceutical. COMPARISON: None available. CLINICAL HISTORY: Cholecystitis; has PEG, recent radiaition and chemotherapy for tonsil FINDINGS: Homogenous uptake within the liver. Normal clearance of the blood pool. Appropriate excretion into the biliary system. Activity is visualized in the small bowel. After 1 hour of imaging, there was no gallbladder  activity visualized. Imaging was performed for an additional 1 hour without gallbladder activity. IMPRESSION: 1. No gallbladder activity visualized through 2 hours. Imaging findings are compatible with cholecystitis. Electronically signed by: Waddell Calk MD 01/24/2024 05:54 AM EDT RP Workstation: [id number]   DG CHEST PORT 1 VIEW Result Date: 01/24/2024 EXAM: 1 VIEW(S) XRAY OF THE CHEST 01/23/2024 11:44:00 PM COMPARISON: 01/19/2024 CLINICAL HISTORY: Port-A-Cath in place [id number]. Difficulty with Port-A-Cath use today. FINDINGS: LINES, TUBES AND DEVICES: Right CT Port-A-Cath in place with tip at cavoatrial junction, unchanged. LUNGS AND PLEURA: No focal pulmonary opacity. No pulmonary edema. No pleural effusion. No pneumothorax. HEART AND MEDIASTINUM: Stable mild cardiomegaly. No acute abnormality of the mediastinal silhouette. BONES AND SOFT TISSUES: No acute osseous abnormality. IMPRESSION: 1. Right CT Port-A-Cath in place with tip at the cavoatrial junction. 2. Stable mild cardiomegaly. Electronically signed by: Franky Stanford MD 01/24/2024 12:11 AM EDT RP Workstation: [id number]   US  Abdomen Limited RUQ (LIVER/GB) Result Date: 01/22/2024 CLINICAL DATA:  [id number] Pain [id number] EXAM: ULTRASOUND ABDOMEN LIMITED RIGHT  UPPER QUADRANT COMPARISON:  01/22/2024 CTA of the abdomen and pelvis FINDINGS: Gallbladder: Multiple small gallstones. 1.4 x 1 cm echogenicity along the nondependent gallbladder wall near the fundus. No wall thickening or pericholecystic fluid. No sonographic Murphy's sign noted by sonographer. Common bile duct: Diameter: 4 mm Liver: Normal echogenicity. No focal lesion identified. No intrahepatic biliary ductal dilation. Portal vein is patent on color Doppler imaging with normal direction of blood flow towards the liver. Right Kidney: Partially visualized. No mass. No hydronephrosis or nephrolithiasis. Other: None. IMPRESSION: 1. Multiple small gallstones. No sonographic findings of acute cholecystitis.  If concern persists for acute cholecystitis, given the findings on the recent CT, a nuclear medicine hepatobiliary scan would be recommended for further characterization. 2. Along the nondependent gallbladder wall near the fundus, there is a nonmobile echogenicity measuring 1.4 x 1 cm, possibly adherent biliary sludge or a gallbladder polyp. Follow-up should be considered, as documented below. Polyp measuring equal to or greater than 1.0 cm: -Increased risk of malignancy. Nonemergent surgical consultation recommended for further management. RECOMMENDATIONS:As per the 2021 joint European Society guidelines, suggested management of gallbladder polyp(s) are as follows: *NB: Risk factors for gallbladder malignancy include: > 31 years old, history of underlying primary sclerosing cholangitis, Asian ethnicity, sessile polyp (including focal wall thickening > 0.4 cm). Electronically Signed   By: Rogelia Myers M.D.   On: 01/22/2024 16:33   CT Angio Abd/Pel W and/or Wo Contrast Result Date: 01/22/2024 CLINICAL DATA:  eval for significant stenosis leading to ischemia. Recent ct abd venous showed panniculitis EXAM: CTA ABDOMEN AND PELVIS WITHOUT AND WITH CONTRAST TECHNIQUE: Multidetector CT imaging of the abdomen and pelvis was performed using the standard protocol during bolus administration of intravenous contrast. Multiplanar reconstructed images and MIPs were obtained and reviewed to evaluate the vascular anatomy. RADIATION DOSE REDUCTION: This exam was performed according to the departmental dose-optimization program which includes automated exposure control, adjustment of the mA and/or kV according to patient size and/or use of iterative reconstruction technique. CONTRAST:  OMNIPAQUE  IOHEXOL  350 MG/ML SOLN COMPARISON:  01/19/2024 FINDINGS: VASCULAR Aorta: No aortic aneurysm or dissection. Scattered calcified aortic atherosclerosis. No hemodynamically significant stenosis. Celiac: Patent without acute thrombus,  aneurysm, or dissection.Mild narrowing of the proximal celiac artery, possibly due to extrinsic compression. SMA: Patent without acute thrombus, aneurysm, or dissection.No hemodynamically significant stenosis. Renals: Patent without acute thrombus, aneurysm, or dissection.No hemodynamically significant stenosis. IMA: Patent without acute thrombus, aneurysm, or dissection.Moderate narrowing of the IMA ostium from calcified plaque. Inflow: Patent without acute thrombus, aneurysm, or dissection.No hemodynamically significant stenosis. Proximal Outflow: The bilateral common femoral and visualized portions of the superficial and profunda femoral arteries are patent without acute thrombus, aneurysm, or dissection.No hemodynamically significant stenosis. Veins: No obvious venous abnormality within the limitations of this arterial phase study. Review of the MIP images confirms the above findings. NON-VASCULAR Lower chest: No focal airspace consolidation or pleural effusion.Posterior bibasilar dependent atelectasis. Mild cardiomegaly. Multi-vessel coronary atherosclerosis. Hepatobiliary: No mass.The gallbladder is distended and fluid-filled without radiopaque stones. Subtle pericholecystic inflammation of the fat in the porta hepatis. There is also asymmetric enhancement within the adjacent hepatic parenchyma, suggesting hyperemia.No intrahepatic or extrahepatic biliary ductal dilation. Pancreas: Diffuse fatty atrophy of the pancreatic parenchyma. No mass or ductal dilation.No peripancreatic inflammation or fluid collection. Spleen: Normal size. No mass. Adrenals/Urinary Tract: No adrenal masses. No renal mass. A couple of small nonobstructive right-sided calyceal calculi are present. No hydronephrosis. The urinary bladder is distended without focal abnormality. Stomach/Bowel:Well-positioned percutaneous gastrostomy tube  in place. The stomach contains ingested material without focal abnormality. No small bowel wall  thickening or inflammation. No small bowel obstruction.Normal appendix. Lymphatic: No intraabdominal or pelvic lymphadenopathy. Reproductive: No prostatomegaly. Brachytherapy seeds filling the prostate parenchyma. No free pelvic fluid. Other: Similar findings of mild mesenteric panniculitis. No pneumoperitoneum or ascites. Musculoskeletal: No acute fracture or destructive lesion.Multilevel degenerative disc disease of the spine. Thoracic DISH. IMPRESSION: VASCULAR 1. No aortic aneurysm or aortic dissection. 2. No acute thrombus within the mesenteric or renal vessels. Moderate narrowing of the IMA ostium from chunky calcified plaque. Otherwise, the celiac and superior mesenteric arteries are widely patent. NON-VASCULAR 1. Fluid-filled, distended gallbladder with pericholecystic inflammation. Asymmetric enhancement is also noted in the adjacent hepatic parenchyma, suggesting hyperemia. These findings are concerning for acute cholecystitis and a nuclear medicine hepatobiliary scan is recommended for further characterization. 2. Couple of small nonobstructive calyceal calculi present in the right kidney. No hydronephrosis. 3. Well-positioned percutaneous gastrostomy tube. Aortic Atherosclerosis (ICD10-I70.0). Electronically Signed   By: Rogelia Myers M.D.   On: 01/22/2024 13:17   CT ABDOMEN PELVIS W CONTRAST Result Date: 01/19/2024 EXAM: CT ABDOMEN AND PELVIS WITH CONTRAST 01/19/2024 03:16:59 AM TECHNIQUE: CT of the abdomen and pelvis was performed with the administration of 100 ml of intravenous iohexol  (OMNIPAQUE ) 300 MG/ML solution and 25 ml of iohexol  (OMNIPAQUE ) 300 MG/ML solution via G-tube. Multiplanar reformatted images are provided for review. Automated exposure control, iterative reconstruction, and/or weight-based adjustment of the mA/kV was utilized to reduce the radiation dose to as low as reasonably achievable. COMPARISON: 09/24/2022 and PET/CT 11/16/2023. CLINICAL HISTORY: Acute, nonlocalized  abdominal pain, LUQ abdominal pain, and emesis starting this AM. Patient is visibly uncomfortable. History of tonsillar cancer with G-tube placement (not used since 9/8, flushed daily with bleeding from site this AM, followed by vomiting). Prior chemo and radiation treatments. WBCs 5.3, GFR > 60. Swallow function test 01/17/24. FINDINGS: LOWER CHEST: No acute abnormality. LIVER: The liver is unremarkable. GALLBLADDER AND BILE DUCTS: Gallbladder is unremarkable. No biliary ductal dilatation. SPLEEN: No acute abnormality. PANCREAS: No acute abnormality. ADRENAL GLANDS: No acute abnormality. KIDNEYS, URETERS AND BLADDER: Nonobstructing right nephrolithiasis. No stones in the left kidney or ureters. No hydronephrosis. No perinephric or periureteral stranding. Urinary bladder is unremarkable. GI AND BOWEL: Percutaneous gastrostomy tube in expected position. Contrast within the stomach. No extraluminal contrast. Additional dense contrast in the right colon and rectum. There is no bowel obstruction. PERITONEUM AND RETROPERITONEUM: No ascites. No free air. VASCULATURE: Aorta is calcified. LYMPH NODES: Misty mesenteric fat with prominent subcentimeter lymph nodes and perinodal fat sparing compatible with mesenteric panniculitis. REPRODUCTIVE ORGANS: Metallic seeds in the prostate. BONES AND SOFT TISSUES: No acute osseous abnormality. No focal soft tissue abnormality. IMPRESSION: 1. No acute abnormalities in the abdomen or pelvis. 2. Percutaneous gastrostomy tube appropriately positioned without leak. 3. Mesenteric panniculitis. 4. Nonobstructing right nephrolithiasis. Electronically signed by: Norman Gatlin MD 01/19/2024 03:37 AM EDT RP Workstation: [id number]   DG Chest Port 1 View Result Date: 01/19/2024 CLINICAL DATA:  Upper abdominal pain and hypoxia. EXAM: PORTABLE CHEST 1 VIEW COMPARISON:  Portable chest 08/14/2023 FINDINGS: Right IJ port catheter terminates at the superior cavoatrial junction, as before. There is  mild cardiomegaly. No evidence of CHF. The mediastinum is normally outlined. There is calcification of the transverse aorta. Visualized lungs clear with both CP sulci excluded from the study. Thoracic spondylosis. No new osseous findings. Thoracic cage is intact. Overlying telemetry leads. IMPRESSION: 1. No evidence of acute chest disease. 2. Mild  cardiomegaly. 3. Aortic atherosclerosis. Electronically Signed   By: Francis Quam M.D.   On: 01/19/2024 02:29   DG SWALLOW FUNC OP MEDICARE SPEECH PATH Result Date: 01/17/2024 Table formatting from the original result was not included. Modified Barium Swallow Study Patient Details Name: DEVEAN SKOCZYLAS MRN: [id number] Date of Birth: Apr 25, 1950 Today's Date: 01/17/2024 HPI/PMH: HPI: Patient is a 73 y.o. 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 He presented to his PCP on 05/17/23 with complaints of right sided jaw pain when coughing. CT neck revealed an ulcerated enhancing mass involving the superior oropharynx and soft palate/uvula in the greatest extent that is concerning for malignancy. PET on 06/07/23 revealed a right oropharyngeal tumor. Patient reported extreme pain when swallowing that prevented him from eating or swallowing on 06/08/23. Before beginning radiation, a  PEG/PAC was done on 2/28 and an MBS was done on 06/28/23. Patient began treatment on 07/12/23 which included 35 fractions of radiation to his right Tonsil and bilateral neck with weekly chemotherapy ending on 08/27/23. Patient has been seeing OP SLP. On 01/04/24, OP SLP recommended MBS due to throat clearing during session with water  and patient reported nasal regurgitation with milk. Patient brought in for OP MBS on 01/17/24. Clinical Impression: Clinical Impression: Patient presents with a primary pharyngeal phase dysphagia and compared to MBS in March of this year, prior to starting radiation treatment, his swallow function has declined. He  himself indicates that his primary nutrition has been 6 Ensure shakes per day but that most foods don't taste right and although he has a desire to eat, but after taking a couple bites, he typically loses all interest. Sensed aspiration occured with thin liquids (PAS 7) at the beginning of the study and silent aspiration occured at the end of the study. Questionable aspiration with nectar thick liquids. Penetration to the vocal cords (PAS 5) that did not clear occured consistently with thin liquids. Pharyngeal impairments include, partial superior movement of the thyroid  cartilage, partial laryngeal elevation, incomplete laryngeal vestibule closure. Patient's epiglottis had bulbous appearance, a change from last MBS on 06/28/23. Chin tuck, left head turn, right head turn were all unsuccessful in preventing penetration/aspiration. Strategy of cough/throat clear and reswallow was the most effective strategy in helping patient clear penetration. Barium tablet became lodged at level of valleculae when swallowed with nectar thick liquid. Tablet fully transited when followed with puree. SLP recommending continue on current diet of regular/thin. Recommending to use strategy of performing a cough/throat clear, then reswallow after sips of liquids. In addition, SLP recommending patient refrain from large sips of liquids and/or consecutive sips of liquids. Encouraged patient to discuss further with OP SLP next scheduled visit. Recommendations/Plan: Swallowing Evaluation Recommendations Swallowing Evaluation Recommendations Recommendations: PO diet PO Diet Recommendation: Regular; Thin liquids (Level 0) Liquid Administration via: Cup; Straw Medication Administration: Whole meds with puree Supervision: Patient able to self-feed Swallowing strategies  : Slow rate; Small bites/sips; Hard cough after swallowing Postural changes: Position pt fully upright for meals; Stay upright 30-60 min after meals Oral care recommendations: Oral  care BID (2x/day) Treatment Plan Treatment Plan Treatment recommendations: Defer treatment plan to SLP at other venue (see follow-up recommendations) Follow-up recommendations: Outpatient SLP Functional status assessment: Patient has had a recent decline in their functional status and demonstrates the ability to make significant improvements in function in a reasonable and predictable amount of time. Interventions: Patient/family education; Oropharyngeal exercises Recommendations Recommendations for follow up therapy are one component  of a multi-disciplinary discharge planning process, led by the attending physician.  Recommendations may be updated based on patient status, additional functional criteria and insurance authorization. Assessment: Orofacial Exam: Orofacial Exam Oral Cavity: Oral Hygiene: WFL Oral Cavity - Dentition: Adequate natural dentition Orofacial Anatomy: WFL Oral Motor/Sensory Function: WFL Anatomy: Anatomy: WFL Boluses Administered: Boluses Administered Boluses Administered: Thin liquids (Level 0); Mildly thick liquids (Level 2, nectar thick); Moderately thick liquids (Level 3, honey thick); Puree; Solid  Oral Impairment Domain: Oral Impairment Domain Lip Closure: No labial escape Tongue control during bolus hold: Cohesive bolus between tongue to palatal seal Bolus preparation/mastication: Timely and efficient chewing and mashing Bolus transport/lingual motion: Brisk tongue motion Oral residue: Trace residue lining oral structures Location of oral residue : Tongue Initiation of pharyngeal swallow : Posterior angle of the ramus  Pharyngeal Impairment Domain: Pharyngeal Impairment Domain Soft palate elevation: Trace column of contrast or air between SP and PW Laryngeal elevation: Partial superior movement of thyroid  cartilage/partial approximation of arytenoids to epiglottic petiole Anterior hyoid excursion: Complete anterior movement Epiglottic movement: Complete inversion Laryngeal vestibule  closure: Incomplete, narrow column air/contrast in laryngeal vestibule Pharyngeal stripping wave : Present - complete Pharyngeal contraction (A/P view only): N/A Pharyngoesophageal segment opening: Complete distension and complete duration, no obstruction of flow Tongue base retraction: Trace column of contrast or air between tongue base and PPW Pharyngeal residue: Collection of residue within or on pharyngeal structures Location of pharyngeal residue: Valleculae  Esophageal Impairment Domain: Esophageal Impairment Domain Esophageal clearance upright position: Complete clearance, esophageal coating Pill: Pill Consistency administered: Puree; Mildly thick liquids (Level 2, nectar thick) Mildly thick liquids (Level 2, nectar thick): Impaired (see clinical impressions) Puree: WFL Penetration/Aspiration Scale Score: Penetration/Aspiration Scale Score 1.  Material does not enter airway: Puree; Solid; Moderately thick liquids (Level 3, honey thick) 8.  Material enters airway, passes BELOW cords without attempt by patient to eject out (silent aspiration) : Thin liquids (Level 0); Mildly thick liquids (Level 2, nectar thick) Compensatory Strategies: Compensatory Strategies Compensatory strategies: Yes Chin tuck: Ineffective Ineffective Chin Tuck: Thin liquid (Level 0) Left head turn: Ineffective Ineffective Left Head Turn: Thin liquid (Level 0) Right head turn: Ineffective Ineffective Right Head Turn: Thin liquid (Level 0) Other(comment): Effective (Swallow thin liquid. Cough/clear throat. Swallow again.) Effective Other(comment): Thin liquid (Level 0)   General Information: Caregiver present: Yes  Diet Prior to this Study: Regular; Thin liquids (Level 0)   Temperature : Normal   Respiratory Status: WFL   Supplemental O2: None (Room air)   History of Recent Intubation: No  Behavior/Cognition: Alert; Cooperative; Pleasant mood Self-Feeding Abilities: Able to self-feed Baseline vocal quality/speech: Normal Volitional Cough:  Able to elicit Volitional Swallow: Able to elicit Exam Limitations: No limitations Goal Planning: Prognosis for improved oropharyngeal function: Fair Barriers to Reach Goals: Overall medical prognosis Barriers/Prognosis Comment: Impact of radiation/chemotherapy Patient/Family Stated Goal: For patient to increase PO intake. Consulted and agree with results and recommendations: Patient; Family member/caregiver Pain: Pain Assessment Pain Assessment: No/denies pain End of Session: Start Time:SLP Start Time (ACUTE ONLY): 1316 Stop Time: SLP Stop Time (ACUTE ONLY): 1355 Time Calculation:SLP Time Calculation (min) (ACUTE ONLY): 39 min Charges: SLP Evaluations $ SLP Speech Visit: 1 Visit SLP Evaluations $Outpatient MBS Swallow: 1 Procedure SLP visit diagnosis: SLP Visit Diagnosis: Dysphagia, pharyngeal phase (R13.13) Past Medical History: Past Medical History: Diagnosis Date  Arthritis   Cancer (HCC)   History of partial thyroidectomy STATES OVERACTIVE THYROID -- NO ISSUES SINCE AGE 30 AND NO MEDS  History  of radiation therapy   07/12/23-09/03/23 Right Tonsil  Dr. Lauraine Golden  Hyperlipidemia   Hypertension   Hypothyroidism   Sleep apnea   Per patient he tried CPAP but could not tolerate.  Thyroid  disease  Past Surgical History: Past Surgical History: Procedure Laterality Date  CIRCUMCISION  01/04/2012  Procedure: CIRCUMCISION ADULT;  Surgeon: Arlena LILLETTE Gal, MD;  Location: [facility];  Service: Urology;  Laterality: N/A;  COLONOSCOPY  04/08/2020  Vito Cirigliano  CYSTOSCOPY  09/04/2022  Procedure: CYSTOSCOPY;  Surgeon: Matilda Senior, MD;  Location: [facility];  Service: Urology;;  IR GASTROSTOMY TUBE MOD SED  06/18/2023  IR IMAGING GUIDED PORT INSERTION  06/18/2023  RADIOACTIVE SEED IMPLANT N/A 09/04/2022  Procedure: RADIOACTIVE SEED IMPLANT/BRACHYTHERAPY IMPLANT;  Surgeon: Matilda Senior, MD;  Location: [facility];  Service: Urology;  Laterality: N/A;  90 MINS   SPACE OAR INSTILLATION N/A 09/04/2022  Procedure: SPACE OAR INSTILLATION;  Surgeon: Matilda Senior, MD;  Location: [facility];  Service: Urology;  Laterality: N/A;  THYROIDECTOMY, PARTIAL  AGE 51  OVERACTIVE THYROID  Norleen IVAR Blase, MA, CCC-SLP Speech Therapy CLINICAL DATA:  Provided history: Cough, unspecified type. Dysphagia, unspecified type. EXAM: MODIFIED BARIUM SWALLOW TECHNIQUE: Different consistencies of barium were administered orally to the patient by the speech pathologist with fluoroscopic imaging of the pharynx, as well as limited imaging of the esophagus, acquired in the lateral projection. The radiologist was present in the fluoroscopy room for this study, providing personal supervision. Cine fluoroscopy/video radiography was acquired for the study. FLUOROSCOPY: Radiation Exposure Index (as provided by the fluoroscopic device): 35.50 mGy Kerma COMPARISON:  Modified barium swallow 06/28/2023. FINDINGS: Different consistencies of barium were administered orally to the patient by the speech pathologist with fluoroscopic imaging of the pharynx, as well as limited imaging of the esophagus, acquired in the lateral projection. The radiologist was present in the fluoroscopy room for this study, providing personal supervision. The speech pathologist observed aspiration with thin and nectar consistencies. Please refer to the speech pathologist's report for complete details. Fairly bulky ventral osteophytes at C5-C6 and C6-C7 (which do not significantly efface the pharynx or cervical esophagus). IMPRESSION: Modified barium swallow as described. Aspiration was observed with thin and nectar consistencies. Please refer to the speech pathologists report for complete details and recommendations. Electronically Signed   By: Rockey Childs D.O.   On: 01/17/2024 14:32   Microbiology: No results found for this or any previous visit (from the past 240 hours).   Labs: Basic Metabolic Panel: Recent  Labs  Lab 01/21/24 0821 01/22/24 1201 01/23/24 0253 01/24/24 0423 01/25/24 0535 01/26/24 0408  NA 138 137 137 135 137 139  K 3.7 3.6 3.7 3.4* 3.9 3.6  CL 102 101 101 101 102 105  CO2 32 27 26 25 25 27   GLUCOSE 144* 144* 142* 108* 131* 114*  BUN 20 20 15 14 18 18   CREATININE 0.59* 0.48* 0.44* 0.47* 0.45* 0.47*  CALCIUM  9.3 9.1 8.8* 8.6* 8.7* 8.3*  MG 2.0  --   --   --   --   --    Liver Function Tests: Recent Labs  Lab 01/22/24 1201 01/23/24 0253 01/24/24 0423 01/25/24 0535 01/26/24 0408  AST 22 30 28  57* 55*  ALT 12 16 15 27 30   ALKPHOS 80 105 100 92 89  BILITOT 1.0 1.0 1.2 0.7 0.3  PROT 6.1* 5.7* 5.5* 5.5* 5.2*  ALBUMIN 3.6 3.3* 3.1* 3.1* 2.8*   Recent Labs  Lab  01/22/24 1201  LIPASE <10*   No results for input(s): AMMONIA in the last 168 hours. CBC: Recent Labs  Lab 01/21/24 0821 01/22/24 1201 01/23/24 0253 01/24/24 0423 01/26/24 0408  WBC 5.9 5.8 6.5 7.8 4.1  NEUTROABS 4.7  --   --   --   --   HGB 10.9* 10.2* 9.8* 9.5* 8.8*  HCT 32.4* 32.3* 31.0* 29.0* 27.9*  MCV 94.7 97.6 98.4 97.0 98.9  PLT 165 150 138* 144* 171   Cardiac Enzymes: No results for input(s): CKTOTAL, CKMB, CKMBINDEX, TROPONINI in the last 168 hours. BNP: BNP (last 3 results) No results for input(s): BNP in the last 8760 hours.  ProBNP (last 3 results) No results for input(s): PROBNP in the last 8760 hours.  CBG: Recent Labs  Lab 01/25/24 0634 01/25/24 1315 01/25/24 2348 01/26/24 0614 01/26/24 1151  GLUCAP 133* 127* 124* 103* 103*       Signed:  Toribio Hummer MD.  [facility] 01/26/2024, 3:05 PM

## 2024-01-26 NOTE — Plan of Care (Signed)

## 2024-01-26 NOTE — Plan of Care (Signed)
  Problem: Education: Goal: Knowledge of General Education information will improve Description: Including pain rating scale, medication(s)/side effects and non-pharmacologic comfort measures 01/26/2024 1411 by Delores Kirsch, RN Outcome: Adequate for Discharge 01/26/2024 1231 by Delores Kirsch, RN Outcome: Progressing   Problem: Health Behavior/Discharge Planning: Goal: Ability to manage health-related needs will improve 01/26/2024 1411 by Delores Kirsch, RN Outcome: Adequate for Discharge 01/26/2024 1231 by Delores Kirsch, RN Outcome: Progressing   Problem: Clinical Measurements: Goal: Ability to maintain clinical measurements within normal limits will improve 01/26/2024 1411 by Delores Kirsch, RN Outcome: Adequate for Discharge 01/26/2024 1231 by Delores Kirsch, RN Outcome: Progressing Goal: Will remain free from infection Outcome: Adequate for Discharge Goal: Diagnostic test results will improve Outcome: Adequate for Discharge Goal: Respiratory complications will improve Outcome: Adequate for Discharge Goal: Cardiovascular complication will be avoided Outcome: Adequate for Discharge   Problem: Activity: Goal: Risk for activity intolerance will decrease Outcome: Adequate for Discharge   Problem: Nutrition: Goal: Adequate nutrition will be maintained Outcome: Adequate for Discharge   Problem: Coping: Goal: Level of anxiety will decrease Outcome: Adequate for Discharge   Problem: Elimination: Goal: Will not experience complications related to bowel motility Outcome: Adequate for Discharge Goal: Will not experience complications related to urinary retention Outcome: Adequate for Discharge   Problem: Pain Managment: Goal: General experience of comfort will improve and/or be controlled Outcome: Adequate for Discharge   Problem: Safety: Goal: Ability to remain free from injury will improve Outcome: Adequate for Discharge   Problem: Skin Integrity: Goal: Risk for  impaired skin integrity will decrease Outcome: Adequate for Discharge

## 2024-01-27 ENCOUNTER — Telehealth: Payer: Self-pay

## 2024-01-27 NOTE — Transitions of Care (Post Inpatient/ED Visit) (Signed)
   01/27/2024  Name: Isaac Carpenter MRN: [id number] DOB: 1950-09-17  Today's TOC FU Call Status: Today's TOC FU Call Status:: Unsuccessful Call (1st Attempt) Unsuccessful Call (1st Attempt) Date: 01/27/24  Attempted to reach the patient regarding the most recent Inpatient/ED visit.  Follow Up Plan: Additional outreach attempts will be made to reach the patient to complete the Transitions of Care (Post Inpatient/ED visit) call.   Alan Ee, RN, BSN, CEN [employer]- Transition of Care Team.  [facility] [phone]

## 2024-01-28 ENCOUNTER — Inpatient Hospital Stay: Admitting: Dietician

## 2024-01-28 ENCOUNTER — Telehealth: Payer: Self-pay | Admitting: Dietician

## 2024-01-28 ENCOUNTER — Telehealth: Payer: Self-pay

## 2024-01-28 ENCOUNTER — Ambulatory Visit

## 2024-01-28 NOTE — Telephone Encounter (Signed)
 Nutrition Follow-up:   Pt with stage II SCC of right tonsil, p16 positive. He completed concurrent chemoradiation (first chemo 3/21; final RT 5/16). Patient is under the care of Dr. Izell and Dr. Autumn.    S/p Gtube 2/28 - replaced at [facility] with non Enfit FT 5/28 DME: [employer]  10/4-10/8 - admission with acute cholecystitis s/p cholecystectomy 10/6  Spoke with patient wife Francie) via telephone. They are really tired. Patient is sore s/p surgery. Drain still in place. Output is more clear. Trying to eat orally despite altered taste. He is supplementing with Ensure. She denies nausea, vomiting, diarrhea, constipation.   Medications: reviewed   Labs: 10/8 hospital labs reviewed   Anthropometrics: Wt 246 lb 7.6 oz on 10/1  9/9 - 246 lb 9.6 oz  8/20 - 248 lb 4.8 oz   NUTRITION DIAGNOSIS: Unintended wt loss - stable   INTERVENTION:  Educated on importance of calories and protein to support post op healing Continue Ensure Complete 3-4/day Continue working to increase oral intake - encourage baking soda salt water  gargles Continue FWF    MONITORING, EVALUATION, GOAL: wt trends, intake   NEXT VISIT: Wednesday December 10 after MD

## 2024-01-28 NOTE — Transitions of Care (Post Inpatient/ED Visit) (Signed)
   01/28/2024  Name: Isaac Carpenter MRN: [id number] DOB: Oct 30, 1950  Today's TOC FU Call Status: Today's TOC FU Call Status:: Unsuccessful Call (2nd Attempt) Unsuccessful Call (2nd Attempt) Date: 01/28/24  Attempted to reach the patient regarding the most recent Inpatient/ED visit.  Follow Up Plan: Additional outreach attempts will be made to reach the patient to complete the Transitions of Care (Post Inpatient/ED visit) call.   Alan Ee, RN, BSN, CEN [employer]- Transition of Care Team.  [facility] [phone]

## 2024-01-31 ENCOUNTER — Encounter: Payer: Self-pay | Admitting: Radiation Oncology

## 2024-01-31 ENCOUNTER — Telehealth: Payer: Self-pay

## 2024-01-31 NOTE — Transitions of Care (Post Inpatient/ED Visit) (Signed)
   01/31/2024  Name: Isaac Carpenter MRN: [id number] DOB: [birth year]  Today's TOC FU Call Status: Today's TOC FU Call Status:: Unsuccessful Call (3rd Attempt) Unsuccessful Call (3rd Attempt) Date: 01/31/24  Attempted to reach the patient regarding the most recent Inpatient/ED visit.  Follow Up Plan: No further outreach attempts will be made at this time. We have been unable to contact the patient.  Alan Ee, RN, BSN, CEN [employer]- Transition of Care Team.  [facility] [phone]

## 2024-02-05 ENCOUNTER — Emergency Department (HOSPITAL_COMMUNITY)
Admission: EM | Admit: 2024-02-05 | Discharge: 2024-02-05 | Disposition: A | Discharge status: Home / Self Care | Attending: Emergency Medicine | Admitting: Emergency Medicine

## 2024-02-05 DIAGNOSIS — I10 Essential (primary) hypertension: Secondary | ICD-10-CM | POA: Insufficient documentation

## 2024-02-05 DIAGNOSIS — Y732 Prosthetic and other implants, materials and accessory gastroenterology and urology devices associated with adverse incidents: Secondary | ICD-10-CM | POA: Insufficient documentation

## 2024-02-05 DIAGNOSIS — T85528A Displacement of other gastrointestinal prosthetic devices, implants and grafts, initial encounter: Secondary | ICD-10-CM | POA: Diagnosis not present

## 2024-02-05 DIAGNOSIS — Z7982 Long term (current) use of aspirin: Secondary | ICD-10-CM | POA: Diagnosis not present

## 2024-02-05 DIAGNOSIS — K9423 Gastrostomy malfunction: Secondary | ICD-10-CM | POA: Diagnosis not present

## 2024-02-05 DIAGNOSIS — E039 Hypothyroidism, unspecified: Secondary | ICD-10-CM | POA: Insufficient documentation

## 2024-02-05 NOTE — Discharge Instructions (Signed)
 It looks as if the tube came out well.  There is some mild redness at the site and is to be followed.  Follow-up with your doctors as needed.

## 2024-02-05 NOTE — ED Triage Notes (Signed)
 Patient in today reporting that feeding tube fell out and a small piece is still stuck inside. Reports feeding tube was scheduled to be removed soon.

## 2024-02-05 NOTE — ED Provider Notes (Signed)
 [place] EMERGENCY DEPARTMENT AT [facility] Provider Note   CSN: [id number] Arrival date & time: 02/05/24  1106     Patient presents with: Foreign Body   Isaac Carpenter is a 73 y.o. male.    Foreign Body Patient with PEG tube that is come out.  Family and patient are worried about retained piece.  Patient states it was due to come out later this week anyway.  No complaints.    Past Medical History:  Diagnosis Date   Arthritis    Cancer (HCC)    History of partial thyroidectomy STATES OVERACTIVE THYROID -- NO ISSUES SINCE AGE 27 AND NO MEDS   History of radiation therapy    07/12/23-09/03/23 Right Tonsil  Dr. Lauraine Golden   Hyperlipidemia    Hypertension    Hypothyroidism    Sleep apnea    Per patient he tried CPAP but could not tolerate.   Thyroid  disease     Prior to Admission medications   Medication Sig Start Date End Date Taking? Authorizing Provider  acetaminophen  (TYLENOL ) 325 MG tablet Take 2 tablets (650 mg total) by mouth every 6 (six) hours as needed for mild pain (pain score 1-3) or fever (or Fever >/= 101). 01/26/24   Vicci Burnard SAUNDERS, PA-C  ALPRAZolam  (XANAX ) 1 MG tablet TAKE ONE-HALF TABLET BY MOUTH TWO TIMES DAILY AS NEEDED FOR ANXIETY Patient taking differently: Take 0.5 mg by mouth 2 (two) times daily. 11/16/23   Burton, Lacie K, NP  aspirin 81 MG chewable tablet Chew 1 tablet (81 mg total) by mouth daily. 02/02/24   Sebastian Toribio GAILS, MD  atorvastatin  (LIPITOR) 20 MG tablet Take 20 mg by mouth daily.    [provider]  doxazosin  (CARDURA ) 1 MG tablet TAKE ONE TABLET BY MOUTH AT BEDTIME 12/01/23   Pasam, Avinash, MD  feeding supplement (ENSURE PLUS HIGH PROTEIN) LIQD Take 237 mLs by mouth 2 (two) times daily between meals. 01/26/24   Sebastian Toribio GAILS, MD  levothyroxine  (SYNTHROID ) 88 MCG tablet TAKE ONE TABLET BY MOUTH DAILY. 07/12/23   [name], [name], MD  ondansetron  (ZOFRAN ) 4 MG tablet Take 1 tablet (4 mg total) by mouth every 6  (six) hours as needed for nausea. 01/26/24   Sebastian Toribio GAILS, MD  oxyCODONE  (OXY IR/ROXICODONE ) 5 MG immediate release tablet Take 0.5-1 tablets (2.5-5 mg total) by mouth every 4 (four) hours as needed for severe pain (pain score 7-10) or moderate pain (pain score 4-6). 01/26/24   Vicci Burnard SAUNDERS, PA-C  pantoprazole  (PROTONIX ) 40 MG tablet Take 40 mg by mouth daily.    [provider]  solifenacin (VESICARE) 5 MG tablet Take 5 mg by mouth daily. 10/12/22   [provider]    Allergies: Morphine , Other, and Testosterone    Review of Systems  Updated Vital Signs BP 132/67 (BP Location: Right Arm)   Pulse 70   Temp 98 F (36.7 C) (Oral)   Resp 18   SpO2 100%   Physical Exam Vitals and nursing note reviewed.  Cardiovascular:     Rate and Rhythm: Normal rate.  Abdominal:     Comments: PEG tube site with mild erythema.  No induration.  Musculoskeletal:     Cervical back: Neck supple.  Skin:    General: Skin is warm.  Neurological:     Mental Status: He is alert. Mental status is at baseline.     (all labs ordered are listed, but only abnormal results are displayed) Labs Reviewed -  No data to display  EKG: None  Radiology: No results found.   Procedures   Medications Ordered in the ED - No data to display                                  Medical Decision Making  Patient with accidental removal of PEG tube.  It appears that the balloon had burst.  Otherwise PEG tube appears intact.  Was due to come out anyway.  Patient does not want tube replaced.  Mild erythema around the site needs to be followed but is not appear necessarily infectious at this time.  Discharge home     Final diagnoses:  Gastrojejunostomy tube dislodgement    ED Discharge Orders     None          Patsey Lot, MD 02/05/24 1530

## 2024-02-07 NOTE — Therapy (Signed)
 OUTPATIENT SPEECH LANGUAGE PATHOLOGY ONCOLOGY TREATMENT/RECERTIFICATION   Patient Name: Isaac Carpenter MRN: [id number] DOB:[birth year], 73 y.o., male Today's Date: 02/08/2024  PCP: [name], Betty, MD REFERRING PROVIDER: Izell Domino, MD  END OF SESSION:  End of Session - 02/08/24 1318     Visit Number 7    Number of Visits 9    Date for Recertification  05/08/24    Authorization Type [employer] - pt needs to fill out form when necessary    SLP Start Time 0932    SLP Stop Time  1015    SLP Time Calculation (min) 43 min    Activity Tolerance Patient tolerated treatment well               Past Medical History:  Diagnosis Date   Arthritis    Cancer (HCC)    History of partial thyroidectomy STATES OVERACTIVE THYROID -- NO ISSUES SINCE AGE 3 AND NO MEDS   History of radiation therapy    07/12/23-09/03/23 Right Tonsil  Dr. Domino Izell   Hyperlipidemia    Hypertension    Hypothyroidism    Sleep apnea    Per patient he tried CPAP but could not tolerate.   Thyroid  disease    Past Surgical History:  Procedure Laterality Date   CHOLECYSTECTOMY N/A 01/24/2024   Procedure: LAPAROSCOPIC CHOLECYSTECTOMY;  Surgeon: Curvin Deward MOULD, MD;  Location: [facility];  Service: General;  Laterality: N/A;   CIRCUMCISION  01/04/2012   Procedure: CIRCUMCISION ADULT;  Surgeon: Arlena LILLETTE Gal, MD;  Location: [facility];  Service: Urology;  Laterality: N/A;   COLONOSCOPY  04/08/2020   Vito Cirigliano   CYSTOSCOPY  09/04/2022   Procedure: CYSTOSCOPY;  Surgeon: Matilda Senior, MD;  Location: [facility];  Service: Urology;;   IR GASTROSTOMY TUBE MOD SED  06/18/2023   IR IMAGING GUIDED PORT INSERTION  06/18/2023   RADIOACTIVE SEED IMPLANT N/A 09/04/2022   Procedure: RADIOACTIVE SEED IMPLANT/BRACHYTHERAPY IMPLANT;  Surgeon: Matilda Senior, MD;  Location: [facility];  Service: Urology;  Laterality: N/A;  90 MINS   SPACE OAR INSTILLATION N/A 09/04/2022    Procedure: SPACE OAR INSTILLATION;  Surgeon: Matilda Senior, MD;  Location: [facility];  Service: Urology;  Laterality: N/A;   THYROIDECTOMY, PARTIAL  AGE 3   OVERACTIVE THYROID    Patient Active Problem List   Diagnosis Date Noted   Acute cholecystitis 01/22/2024   Prediabetes 01/22/2024   Type 2 diabetes mellitus (HCC) 01/22/2024   Abdominal pain 01/19/2024   Cancer (HCC) 08/14/2023   Febrile neutropenia 08/14/2023   S/P percutaneous endoscopic gastrostomy (PEG) tube placement (HCC) 08/14/2023   Hyponatremia 08/14/2023   Normocytic anemia 08/14/2023   Chemotherapy induced neutropenia 07/30/2023   Fatigue 07/16/2023   Situational anxiety 07/09/2023   Encounter for antineoplastic chemotherapy 07/09/2023   Dysphagia 07/09/2023   Port-A-Cath in place 07/05/2023   Sensorineural hearing loss (SNHL) of right ear 06/23/2023   Cancer associated pain 06/11/2023   Coordination of complex care 06/11/2023   Malignant neoplasm of tonsillar fossa (HCC) 06/08/2023   Malignant neoplasm of prostate (HCC) 07/17/2022   Hyperlipidemia 05/11/2021   Morbid obesity (HCC)-BMI 37.8 with DM II,HLD,OSA,HTN 05/09/2021   Hypertension, essential, benign 07/23/2020   OSA (obstructive sleep apnea) 07/23/2020   Type 2 diabetes mellitus with other specified complication (HCC) 01/31/2020   BPH associated with nocturia 01/31/2020   Hypothyroidism, postsurgical 01/31/2020   .Speech Therapy Progress Note  Dates of Reporting Period: 12/02/23 to present  Subjective Statement:  Pt has been seen for 7 ST visits focusing on swallowing after ChRT.  Objective: See below.  Goal Update: See below.  Plan: Se pt 1-2 more sessions  Reason Skilled Services are Required: Ensure pt has success over time.    ONSET DATE: see pertinent history   REFERRING DIAG: Malignant Neoplasm of tonsillar fossa  THERAPY DIAG:  Dysphagia, unspecified type  Rationale for Evaluation and Treatment:  Rehabilitation  SUBJECTIVE:   SUBJECTIVE STATEMENT: I've been having a hard time the last two weeks. I had my gall bladder out about a week and a half ago.  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 He presented to his PCP on 05/17/23 with complaints of right sided jaw/maxillary pain when coughing. 05/17/23 He underwent a CT neck which revealed an ulcerated enhancing mass involving the superior oropharynx and soft palate/uvula measuring 3.4 x 3.0 x 3.6 cm in the greatest extent that is concerning for malignancy. No evidence of cervical lymphadenopathy was indicated on scan. 05/27/23 He saw Dr. Soldatova. She completed a laryngoscopy with biopsies which revealed Invasive moderately differentiated SCC, p 16 +. 06/04/23 visit with Dr. Soldatova for worsening symptoms of odynophagia and dysphagia. He was unable to maintain adequate nutrition. 06/07/23 PET revealing a right oropharyngeal tumor >4cm, likely b/l level II adenopathy, no distant metastases. Consult with Dr. Izell 06/08/23, patient reported extreme pain when swallowing that prevents him from eating or swallowing, PEG tube scheduled. 2/20 consult with Dr. Autumn. He will receive chemotherapy/radiation. Treatment plan:  He will receive 35 fractions of radiation to his right Tonsil and bilateral neck with weekly chemotherapy which started on 07/12/23 and will complete 08/27/23. Pretreatment procedures: PEG/PAC 06/18/23. MBSS 06/28/23. When he presented for consult he had severe dysphagia and PEG was placed quickly. He did recover from the dysphagia (patient thinks it was related to biopsy pain).  PAIN:  Are you having pain? No   FALLS: Has patient fallen in last 6 months?  No  PATIENT GOALS: Maintain WNL swallowing  OBJECTIVE:  Note: Objective measures were completed at Evaluation unless otherwise noted. DIAGNOSTIC FINDINGS: See Pertinent history  above  INSTRUMENTAL SWALLOW STUDY FINDINGS (MBSS)  01/17/24: Clinical Impression (P) Patient presents with a primary pharyngeal phase dysphagia and compared to MBS in March of this year, prior to starting radiation treatment, his swallow function has declined. He himself indicates that his primary nutrition has been 6 Ensure shakes per day but that most foods don't taste right and although he has a desire to eat, but after taking a couple bites, he typically loses all interest. Sensed aspiration occured with thin liquids (PAS 7) at the beginning of the study and silent aspiration occured at the end of the study. Questionable aspiration with nectar thick liquids. Penetration to the vocal cords (PAS 5) that did not clear occured consistently with thin liquids. Pharyngeal impairments include, partial superior movement of the thyroid  cartilage, partial laryngeal elevation, incomplete laryngeal vestibule closure. Patient's epiglottis had bulbous appearance, a change from last MBS on 06/28/23. Chin tuck, left head turn, right head turn were all unsuccessful in preventing penetration/aspiration. Strategy of cough/throat clear and reswallow was the most effective strategy in helping patient clear penetration. Barium tablet became lodged at level of valleculae when swallowed with nectar thick liquid. Tablet fully transited when followed with puree. SLP recommending continue on current diet of regular/thin. Recommending to use strategy of performing a  cough/throat clear, then reswallow after sips of liquids. In addition, SLP recommending patient refrain from large sips of liquids and/or consecutive sips of liquids. Encouraged patient to discuss further with OP SLP next scheduled visit. Swallow Evaluation Recommendations Recommendations: PO diet PO Diet Recommendation: Regular;Thin liquids (Level 0) Liquid Administration via: Cup;Straw Medication Administration: Whole meds with puree Supervision: Patient able to  self-feed Swallowing strategies  : Slow rate;Small bites/sips;Hard cough after swallowing Postural changes: Position pt fully upright for meals;Stay upright 30-60 min after meals Oral care recommendations: Oral care BID (2x/day)  06/28/23 Clinical Impression: Patient presents with an oropharyngeal swallow that is largely [facility]. No penetration or aspiration occured during any phase of the swallow with any of the tested liquid or solid consistencies. Anterior hyoid excursion appeared partial in completion but patient with full epiglottic inversion and laryngeal vestibule closure. With solids, he did exhibit increased amount of vallecular residuals but with clearance with subsequent swallows and sips of liquids. PES opening appeared [facility] and no retrograde flow of barium observed in upper esophagus. Trace amount of barium residue remained in oropharynx at level of known mass, however barium fully cleared with subsequent swallows. Barium tablet transit appeared to stall in distal esophagus.    SLP recommends referral to OP SLP secondary to planned oropharyngeal chemoradiation treatment.   Recommendations/Plan: Swallowing Evaluation Recommendations PO Diet Recommendation: Regular; Thin liquids (Level 0) Liquid Administration via: Cup; Straw Medication Administration: Whole meds with liquid Supervision: Patient able to self-feed Swallowing strategies  : Follow solids with liquids Postural changes: Position pt fully upright for meals Oral care recommendations: Oral care BID (2x/day)                                                                                                                            TREATMENT DATE:   02/08/24: Pt relates difficulty in middle of the night and in mornings with phlegm. Pt drinking Ensure and Boost right now for caloric and nutrient needs. SLP shared again with pt about trying different foods and keeping a food journal. Today pt had 4 teaspoons of applesauce, 2 bites of  Nutri-Grain bar, and 8 drinks of water  without any overt s/sx oral dysphagia and rare wet voice after boluses- pt used his precautions with initial cues but was consistent with these throughout the remainder of the session. SLP explained the rationale for these. HEP completed with independence. SLP reiterated pt must perform >20 reps each (except Shaker), 5 days a week.   01/04/24: Suggest MBS due to throat clearing today with H2O and reported nasal regurgitation with milk. No overt s/sx aspiration PNA - pt has been drinking Ensure regularly since 12/28/23 and has cont to try different foods but dysgeusia makes this difficult. Today pt had applesauce and water  and consistently cleared throat (4/5 sips) with water . Applesauce appeared to clear better/WNL(?) through pharynx.  SLP thickened water  to consistency of Ensure and pt without throat clearing. Told pt/wife to thicken all liquids to  Ensure consistency. Suggested they run humidifier at night as pt has thick mucous every morning that is very difficult to clear. SLP also suggested warm liquids (non-caffeinated) to assist with this in mornings, thickeded to nectar/Ensure thick. Lastly SLP encouraged pt to keep a food journal and provided examples, verbally.  With exercises pt req'd occasional min A for procedure. Has not been doing exercises - SLP told pt to compele 30/day of effortful, Masako, and Mendelson, and 6 of shaker. Pt was provided rationale for this.   12/02/23: Pt has tried pinto beans, hash browns and fried egg, pancakes, and turnip greens. Likes Gatorade but not water . Does not have appetite - SLP suggested holding 1/2 of a feeding or two during the day to increase hunger level as pt stated if he were hungry he thinks he would try to eat more frequently and amount. Today he ate applesauce and drank H2O without any overt s/sx pharyngeal deficits, nor oral deficits. SLP reminded pt about food journal and suggested pt start one. SLP assisted pt  making one with food he has tried thus far. Pt told SLP benefit of filling one out. SLP strongly encouraged pt to complete HEP and reminded pt of rationale.  11/02/23: Yesterday Dr. Izell prescribed Chloraseptic lozenges as well as spray. Pt and SO to pick up ASAP. [name] stated Dr. Izell told pt to discharge med for thrush.  Pt c/o pain as above but was willing to participate with SLP today. He has not completed HEP due to pain, minimal POs since ~ two weeks ago when thrush began. Prior to this was doing more liquids during the day. Pt with occasional coughing prior to POs. Pt took four 1/3 teaspoons water  today with delayed cough (15-30 seconds later) with 3/4 of them. Pt stated cough due to tickle and was unsure if a wrong pipe situation was felt with each of these. SLP suspects pt not swallowing with full ROM due to anticipation of pain increasing to 10/10. No overt s/sx aspiration PNA but SLP gave these to pt today with explanation if having these s/sx discontinue all PO asap and inform/notify MDs. Recommended pt have at least 10 ice chips at least 8 times/day to inhibit muscle disuse atrophy, encourage swallowing musculature. SLP told pt to perform HEP reps as he is able and provided rationale. SLP does not think MBS necessary at this time as pt's pain would skew results (suspect pt is not using full ROM), but would recommend when pt's pain is better managed, or pt has a more immediate cough response to POs, or pt has s/sx aspiration PNA.   10/04/23: Pt with rough voice today. [name] reports he was very groggy and verbally hallucinating last night on Fentanyl  patch. She removed the patch and is picking up a lower dose today after pt obtains fluids at [facility]. He has had minimal PO daily. Today pt had multiple teaspoons of water  without overt s/sx aspiration. When he took one of three cup sips, pt had violent coughing. SLP told pt to have multiple teaspoons water  (preferably warm), 8-10 times a day and  explained rationale for this. SLP ascertained through having pt take different SIZE TEASPOON SIPS that pt's odynophagia is ASSOCIATED WITH SIP SIZE. SLP told pt to have teaspoon sips and pain did not incr greater than 3/10. With HEP, pt has not been completing as directed. SLP again reinforced rationale for HEP and pt's performance was WNL with rare min A for tongue protrusion. SLP told pt to perform  with drops of water  as he needs. Breathing sounded slightly labored today but after pt coughed, breathing was not sounding as labored. Sixty seconds later pt's breathing became more labored again, but not as labored as originally. [name] reports that when he coughs and frees phlegm, it is hardened. SLP strongly suggested pt drink teaspoons of warm water  to more easily free dried phlegm (assumed) from pharynx. Wife is running humidifiers in the home.   09/02/23: Pt c/o pain in throat 10/10. Is not doing any reps of HEP currently, and has not done any since last ST session. Pt told SLP rationale of HEP with independence. Pt took 1/2-3/4 teaspoons water  today without s/sx oral, or overt s/sx pharyngeal deficits. SLP encouraged pt to have at least 2-3 teaspoons of water  at least 2-3 times/day and increase this as pt is able, up to small sips of water  multiple times per day. SLP reminded pt about muscle disuse atrophy and that this was possible due to the time pt has not had anything PO. SLP educated pt and SO about food journal.  SLP demonstrated each exercise of HEP (Except Shaker) with pt and SO, and described a few examples of how pt could add HEP back into his daily routine, with goal being at least 20 reps of effortful, Masako, and Mendelsohn per day. SLP reiterated pt could perform multiple reps of Shaker without the need to swallow and encouraged pt to perform this exercise 9-12 times/day starting today.  07/22/23: Research states the risk for dysphagia increases due to radiation and/or chemotherapy treatment due  to a variety of factors, so SLP educated the pt about the possibility of reduced/limited ability for PO intake during rad tx. SLP also educated pt regarding possible changes to swallowing musculature after rad tx, and why adherence to dysphagia HEP provided today and PO consumption was necessary to inhibit muscle fibrosis following rad tx and to mitigate muscle disuse atrophy. SLP informed pt why this would be detrimental to their swallowing status and to their pulmonary health. Pt demonstrated understanding of these things to SLP. SLP encouraged pt to safely eat and drink as deep into their radiation/chemotherapy as possible to provide the best possible long-term swallowing outcome for pt.  SLP then developed an individualized HEP for pt involving oral and pharyngeal strengthening and ROM and pt was instructed how to perform these exercises, including SLP demonstration. After SLP demonstration, pt return demonstrated each exercise. SLP ensured pt performance was correct prior to educating pt on next exercise. Pt required min-mod cues faded to modified independent to perform HEP. Pt was instructed to complete this program 5-7 days/week, at least 20 reps a day until 6 months after his last day of rad tx, and then x2/week after that, indefinitely. Among other modifications for days when pt cannot functionally swallow, SLP also suggested pt to perform only non-swallowing tasks on the handout/HEP, and if necessary to cycle through the swallowing portion so the full program of exercises can be completed instead of fatiguing on one of the swallowing exercises and being unable to perform the other swallowing exercises. SLP instructed that swallowing exercises should then be added back into the regimen as pt is able to do so.   PATIENT EDUCATION: Education details: late effects head/neck radiation on swallow function, HEP procedure, and modification to HEP when difficulty experienced with swallowing during and after  radiation course Person educated: Patient and Caregiver [name] Education method: Explanation, Demonstration, Verbal cues, and Handouts Education comprehension: verbalized understanding, returned demonstration, verbal  cues required, and needs further education   ASSESSMENT:  CLINICAL IMPRESSION: RECERT TODAY. Patient is a 72 y.o. M who was seen today for treatment of swallowing after chemoradiation therapy. See treatment date for details on today's session. There are no overt s/s aspiration PNA observed by SLP nor any reported by pt at this time. Pt had PEG removed in the last two weeks, via an accident. Dysgeusia is hindering a more robust attempt at POs. Data indicate that pt's swallow ability could very well decline over time following the conclusion of rad therapy due to muscle disuse atrophy and/or muscle fibrosis. Pt will cont to need to be seen by SLP in order to assess safety of PO intake, assess the need for recommending any objective swallow assessment, and ensuring pt is correctly completing the individualized HEP.  OBJECTIVE IMPAIRMENTS: include dysphagia. These impairments are limiting patient from safety when swallowing. Factors affecting potential to achieve goals and functional outcome are none noted today. Patient will benefit from skilled SLP services to address above impairments and improve overall function.  REHAB POTENTIAL: Good   GOALS: Goals reviewed with patient? No  SHORT TERM GOALS: Target: 3rd total session   1. Pt will compelte HEP with modified independence in 2 sessions Baseline: Goal status: not met   2.  pt will tell SLP why pt is completing HEP with modified independence Baseline:  Goal status: Met   3.  pt will describe 3 overt s/s aspiration PNA with modified independence Baseline:  Goal status: moved to LTG   4.  pt will tell SLP how a food journal could hasten return to a more normalized diet Baseline:  Goal status: Met     LONG TERM GOALS:  Target:  9th total session   1.  pt will complete HEP with independence over two visits Baseline: 02/08/24 Goal status: partially met and cont   2.  pt will describe how to modify HEP over time, and the timeline associated with reduction in HEP frequency with modified independence over two sessions Baseline:  Goal status: not met and cont  3.  pt will describe 3 overt s/s aspiration PNA with modified independence Baseline:  Goal status: met     PLAN:   SLP FREQUENCY:  once approx every 4 weeks   SLP DURATION:  7 sessions   PLANNED INTERVENTIONS: Aspiration precaution training, Pharyngeal strengthening exercises, Diet toleration management , Trials of upgraded texture/liquids, SLP instruction and feedback, Compensatory strategies, and Patient/family education, [phone] (treatment of swallowing dysfunction and/or oral function for feeding)   Clea Dubach, CCC-SLP 02/08/2024, 1:19 PM

## 2024-02-08 ENCOUNTER — Ambulatory Visit: Attending: Radiology

## 2024-02-08 DIAGNOSIS — R1310 Dysphagia, unspecified: Secondary | ICD-10-CM | POA: Insufficient documentation

## 2024-02-09 ENCOUNTER — Other Ambulatory Visit: Payer: Self-pay | Admitting: Oncology

## 2024-02-09 DIAGNOSIS — N401 Enlarged prostate with lower urinary tract symptoms: Secondary | ICD-10-CM

## 2024-02-09 NOTE — Progress Notes (Signed)
 Isaac Carpenter                                          MRN: [id number]   02/09/2024   The [facility] Quality Team Specialist reviewed this patient medical record for the purposes of chart review for care gap closure. The following were reviewed: chart review for care gap closure-diabetic eye exam.    [facility] Quality Team

## 2024-02-11 ENCOUNTER — Other Ambulatory Visit (HOSPITAL_COMMUNITY)

## 2024-02-11 ENCOUNTER — Other Ambulatory Visit: Payer: Self-pay | Admitting: Nurse Practitioner

## 2024-02-11 DIAGNOSIS — F418 Other specified anxiety disorders: Secondary | ICD-10-CM

## 2024-02-15 ENCOUNTER — Encounter: Payer: Self-pay | Admitting: Radiation Oncology

## 2024-02-23 ENCOUNTER — Inpatient Hospital Stay: Attending: Nurse Practitioner | Admitting: Dietician

## 2024-02-23 ENCOUNTER — Telehealth: Payer: Self-pay | Admitting: Dietician

## 2024-02-23 NOTE — Telephone Encounter (Signed)
 Attempted to contact patient via telephone for scheduled nutrition follow-up. VM box is full. Unable to leave message. Will continue efforts to reach patient as able.

## 2024-03-13 ENCOUNTER — Ambulatory Visit

## 2024-03-20 ENCOUNTER — Ambulatory Visit (HOSPITAL_COMMUNITY)
Admission: RE | Admit: 2024-03-20 | Discharge: 2024-03-20 | Disposition: A | Discharge status: Home / Self Care | Source: Ambulatory Visit | Attending: Oncology | Admitting: Oncology

## 2024-03-20 DIAGNOSIS — C090 Malignant neoplasm of tonsillar fossa: Secondary | ICD-10-CM | POA: Insufficient documentation

## 2024-03-20 MED ORDER — IOHEXOL 300 MG/ML  SOLN
75.0000 mL | Freq: Once | INTRAMUSCULAR | Status: AC | PRN
  Administered 2024-03-20: 75 mL via INTRAVENOUS

## 2024-03-20 MED ORDER — SODIUM CHLORIDE (PF) 0.9 % IJ SOLN
INTRAMUSCULAR | Status: AC
  Filled 2024-03-20: qty 50

## 2024-03-20 MED ORDER — HEPARIN SOD (PORK) LOCK FLUSH 100 UNIT/ML IV SOLN
500.0000 [IU] | Freq: Once | INTRAVENOUS | Status: AC
  Administered 2024-03-20: 500 [IU] via INTRAVENOUS

## 2024-03-29 ENCOUNTER — Inpatient Hospital Stay: Admitting: Dietician

## 2024-03-29 ENCOUNTER — Inpatient Hospital Stay: Attending: Nurse Practitioner

## 2024-03-29 ENCOUNTER — Inpatient Hospital Stay: Admitting: Oncology

## 2024-03-29 ENCOUNTER — Other Ambulatory Visit: Payer: Self-pay

## 2024-03-29 VITALS — BP 127/66 | HR 73 | Temp 97.3°F | Resp 17 | Ht 73.0 in | Wt 244.0 lb

## 2024-03-29 DIAGNOSIS — R432 Parageusia: Secondary | ICD-10-CM | POA: Insufficient documentation

## 2024-03-29 DIAGNOSIS — D72819 Decreased white blood cell count, unspecified: Secondary | ICD-10-CM | POA: Insufficient documentation

## 2024-03-29 DIAGNOSIS — Z7982 Long term (current) use of aspirin: Secondary | ICD-10-CM | POA: Insufficient documentation

## 2024-03-29 DIAGNOSIS — Z9221 Personal history of antineoplastic chemotherapy: Secondary | ICD-10-CM | POA: Diagnosis not present

## 2024-03-29 DIAGNOSIS — C090 Malignant neoplasm of tonsillar fossa: Secondary | ICD-10-CM | POA: Diagnosis not present

## 2024-03-29 DIAGNOSIS — Z79899 Other long term (current) drug therapy: Secondary | ICD-10-CM | POA: Insufficient documentation

## 2024-03-29 DIAGNOSIS — Z8546 Personal history of malignant neoplasm of prostate: Secondary | ICD-10-CM | POA: Insufficient documentation

## 2024-03-29 DIAGNOSIS — E039 Hypothyroidism, unspecified: Secondary | ICD-10-CM | POA: Insufficient documentation

## 2024-03-29 DIAGNOSIS — K117 Disturbances of salivary secretion: Secondary | ICD-10-CM | POA: Diagnosis not present

## 2024-03-29 DIAGNOSIS — Z923 Personal history of irradiation: Secondary | ICD-10-CM | POA: Diagnosis not present

## 2024-03-29 LAB — CBC WITH DIFFERENTIAL (CANCER CENTER ONLY)
Abs Immature Granulocytes: 0 K/uL (ref 0.00–0.07)
Basophils Absolute: 0 K/uL (ref 0.0–0.1)
Basophils Relative: 0 %
Eosinophils Absolute: 0.1 K/uL (ref 0.0–0.5)
Eosinophils Relative: 3 %
HCT: 35.7 % — ABNORMAL LOW (ref 39.0–52.0)
Hemoglobin: 11.6 g/dL — ABNORMAL LOW (ref 13.0–17.0)
Immature Granulocytes: 0 %
Lymphocytes Relative: 18 %
Lymphs Abs: 0.5 K/uL — ABNORMAL LOW (ref 0.7–4.0)
MCH: 31.3 pg (ref 26.0–34.0)
MCHC: 32.5 g/dL (ref 30.0–36.0)
MCV: 96.2 fL (ref 80.0–100.0)
Monocytes Absolute: 0.3 K/uL (ref 0.1–1.0)
Monocytes Relative: 12 %
Neutro Abs: 1.8 K/uL (ref 1.7–7.7)
Neutrophils Relative %: 67 %
Platelet Count: 123 K/uL — ABNORMAL LOW (ref 150–400)
RBC: 3.71 MIL/uL — ABNORMAL LOW (ref 4.22–5.81)
RDW: 14.7 % (ref 11.5–15.5)
WBC Count: 2.7 K/uL — ABNORMAL LOW (ref 4.0–10.5)
nRBC: 0 % (ref 0.0–0.2)

## 2024-03-29 LAB — CMP (CANCER CENTER ONLY)
ALT: 18 U/L (ref 0–44)
AST: 34 U/L (ref 15–41)
Albumin: 3.8 g/dL (ref 3.5–5.0)
Alkaline Phosphatase: 76 U/L (ref 38–126)
Anion gap: 7 (ref 5–15)
BUN: 22 mg/dL (ref 8–23)
CO2: 30 mmol/L (ref 22–32)
Calcium: 9 mg/dL (ref 8.9–10.3)
Chloride: 104 mmol/L (ref 98–111)
Creatinine: 0.54 mg/dL — ABNORMAL LOW (ref 0.61–1.24)
GFR, Estimated: >60 mL/min (ref >60)
Glucose, Bld: 139 mg/dL — ABNORMAL HIGH (ref 70–99)
Potassium: 3.8 mmol/L (ref 3.5–5.1)
Sodium: 142 mmol/L (ref 135–145)
Total Bilirubin: 0.5 mg/dL (ref 0.0–1.2)
Total Protein: 6.2 g/dL — ABNORMAL LOW (ref 6.5–8.1)

## 2024-03-29 LAB — TSH: TSH: 6.54 u[IU]/mL — ABNORMAL HIGH (ref 0.350–4.500)

## 2024-03-29 LAB — MAGNESIUM: Magnesium: 2 mg/dL (ref 1.7–2.4)

## 2024-03-29 NOTE — Progress Notes (Signed)
 "     [facility]  ONCOLOGY CLINIC PROGRESS NOTE   Patient Care Team: Jordan, [name], MD as PCP - General (Family Medicine) Vertell Pont, RN as Oncology Nurse Navigator Octavia Charleston, MD as Referring Physician (Ophthalmology) Matilda Senior, MD as Consulting Physician (Urology) Patrcia Cough, MD as Consulting Physician (Radiation Oncology) Starla Wendelyn BIRCH, RN as Registered Nurse Crawford, Morna Pickle, NP as Nurse Practitioner (Hematology and Oncology) Izell Domino, MD as Attending Physician (Radiation Oncology) Okey Burns, MD as Consulting Physician (Otolaryngology) Malmfelt, Delon CROME, RN as Oncology Nurse Navigator Autumn Millman, MD as Consulting Physician (Oncology)  PATIENT NAME: Isaac Carpenter   MR#: [id number] DOB: 1950/08/07  Date of visit: 03/29/2024   ASSESSMENT & PLAN:   Isaac Carpenter is a 73 y.o. gentleman with a past medical history of prostate cancer diagnosed in March 2024, S/P radioactive seed implant/brachytherapy, hypertension, dyslipidemia, hypothyroidism, obstructive sleep apnea. He presented for follow up of recently diagnosed squamous cell carcinoma of the right tonsil, clinical stage II disease (cT3,cN2,cM0,p16+).   Malignant neoplasm of tonsillar fossa (HCC) Please review oncology history for additional details and timeline of events.  Symptoms include severe pain and dysphagia since mid-November, progressively worsening.   cT3,cN2,cM0,p16+ tumor, Stage II disease.   His case was discussed in tumor conference on 06/09/2023.  Given the extent of disease, consensus opinion is to proceed with concurrent chemoradiation. We have discussed about role of cisplatin  being a radiosensitizer in the treatment of head and neck cancer.  We have discussed about the curative intent of chemoradiation for this patient.  Patient was willing to proceed with weekly cisplatin  after discussing risk versus benefits and side effect profile.  He started  cycle 1 of cisplatin  at a dose of 40 mg/m on 07/09/2023.  Plan was to continue cisplatin  weekly during the course of radiation. He began radiation treatments from 07/12/2023.  During week 2, chemotherapy had to be held because of severe fatigue with ECOG performance status of 3.  He received total of 4 cycles of Cisplatin . Last cisplatin  was on 08/06/23. Stopped early because of side effects including neutropenia/ leukopenia, fatigue, nausea, weight loss.   Restaging PET scan on 11/16/2023 showed overall improvement in disease without evidence of disease progression.    On 03/20/2024, restaging CT soft tissue of the neck showed interval resolution of the previously seen ulcerative enhancing right oropharyngeal mass with no evidence of residual mass; posttreatment soft tissue distortion noted. No cervical lymphadenopathy.  CT chest showed no evidence of metastatic disease.  He remains in remission.  Will continue surveillance as per NCCN guidelines.  He has follow-up with ENT Dr. Kermit on 04/11/2024.  We referred him for Port-A-Cath removal, since there is no evidence of disease currently.  Xerostomia and dysgeusia post head and neck cancer treatment Persistent xerostomia and dysgeusia following head and neck cancer treatment. Reports inability to chew gum due to lack of saliva and persistent bad taste despite zinc supplementation and mints. Xylitol gum recommended to stimulate saliva production. Taste issues may improve over time, but current interventions are limited to symptomatic relief. - Recommended xylitol gum to stimulate saliva production. - Continue high-calorie protein drinks to maintain weight. - Discuss taste issues with speech pathologist on December 23rd.  Leukopenia White blood cell count at 2,700, likely secondary to recent antibiotic use for gallbladder infection. Hemoglobin and platelets are recovering. No immediate intervention required as counts are improving. - Continue to  monitor white blood cell count and other blood  parameters.  I reviewed lab results and outside records for this visit and discussed relevant results with the patient. Diagnosis, plan of care and treatment options were also discussed in detail with the patient. Opportunity provided to ask questions and answers provided to his apparent satisfaction. Provided instructions to call our clinic with any problems, questions or concerns prior to return visit. I recommended to continue follow-up with PCP and sub-specialists. He verbalized understanding and agreed with the plan.   NCCN guidelines have been consulted in the planning of this patients care.  I spent a total of 30 minutes during this encounter with the patient including review of chart and various tests results, discussions about plan of care and coordination of care plan.   Chinita Patten, MD  03/29/2024 10:52 AM  [facility] - [facility] DE[facility] [address] [place] [address] Dept: [phone] Dept Fax: [phone]    CHIEF COMPLAINT/ REASON FOR VISIT:   Follow-up for squamous cell carcinoma of the right tonsil, stage II disease (cT3,cN2,cM0,p16+)  Current Treatment: Concurrent chemoradiation with weekly cisplatin  started from 07/09/2023.  INTERVAL HISTORY:    Discussed the use of AI scribe software for clinical note transcription with the patient, who gave verbal consent to proceed.   History of Present Illness Isaac Carpenter is a 73 year old male with throat cancer in remission who presents with persistent taste disturbances and dry mouth.  He experiences persistent taste disturbances, describing all foods as tasting unpleasant, which leads him to spit out food. He has tried zinc supplements, mints, and hard candy without improvement. Additionally, he reports a lack of saliva, making it difficult to chew gum, and notes that while food smells good,  the taste is unpleasant upon eating.  He underwent gallbladder removal surgery shortly after his last visit due to abdominal pain and vomiting. The gallbladder was found to have a polyp, stones, and was necrotic. He was hospitalized and received multiple antibiotics during this period.  His feeding tube fell off on its own, necessitating a visit to the emergency room. He is currently maintaining his weight with high-calorie protein drinks and reports having regained his energy, feeling as good as he did thirty years ago. 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  He experiences persistent phlegm in his throat, which is difficult to expel. He has tried Mucinex, steam, and Robitussin without relief. He finds the phlegm sticky and hard to remove, often needing to use his finger to clear it.  Recent blood work shows a low white blood cell count. His hemoglobin and platelet counts are improving.    I have reviewed the past medical history, past surgical history, social history and family history with the patient and they are unchanged from previous note.  HISTORY OF PRESENT ILLNESS:   ONCOLOGY HISTORY:   He presented to his PCP Dr Betty Jordan on 05-17-23 with complains of right-sided jaw/maxillary pain that tends to occur when coughing. Pain had persisted for several months at that time following an upper respiratory infection.    Subsequently, he underwent a CT soft tissue neck on 05-17-23 which revealed an ulcerated enhancing mass involving the superior oropharynx and soft palate/uvula measuring 3.4 x 3.0 x 3.6 cm in the greatest extent that is concerning for malignancy. No evidence of cervical lymphadenopathy was indicated on scan    In light of findings, the patient saw Dr. Elena  Soldatova on 05-27-23 with worsening symptoms. During his visit, he underwent a flexible fiberoptic laryngoscopy with several biopsies of the friable tissue from the right oropharyngeal mass.      Biopsy of right oropharynx mass on 05-27-23 revealed: Invasive moderately differentiated squamous cell carcinoma. Immunohistochemistry for p16 shows diffuse strong positivity.    During most recent visit with Dr. Soldatova on 06-04-23, patient reported constant worsening symptoms of odynophagia and dysphagia. Stating that he's unable to maintain adequate nutrition as food typically consists of few bites of soft food and liquids. Pain does not seem to be managed by pain relievers including oxycodone . To further evaluate the extent of the disease, he underwent a PET scan performed on 06-07-23 revealing a right  oropharyngeal tumor >4cm, likely b/l level II adenopathy, no distant metastases.    cT3,cN2,cM0,p16+ tumor, Stage II disease.    Plan made for concurrent chemoradiation with weekly cisplatin . Started Cisplatin  from 07/09/2023 and radiation from 07/12/23.   He received total of 4 cycles of Cisplatin . Last cisplatin  was on 08/06/23. Stopped early because of side effects including neutropenia/ leukopenia, fatigue, nausea, weight loss.   Restaging PET scan on 11/16/2023 showed overall improvement in disease without evidence of disease progression.  On 03/20/2024, restaging CT soft tissue of the neck showed interval resolution of the previously seen ulcerative enhancing right oropharyngeal mass with no evidence of residual mass; posttreatment soft tissue distortion noted. No cervical lymphadenopathy.  CT chest showed no evidence of metastatic disease.  He remains in remission.  Will continue surveillance as per NCCN guidelines.  Oncology History  Malignant neoplasm of prostate (HCC)  05/13/2022 Cancer Staging   Staging form: Prostate, AJCC 8th Edition - Clinical stage from 05/13/2022: Stage IIC (cT1c, cN0, cM0, PSA: 13.2, Grade Group: 3) - Signed by Sherwood Rise, PA-C on 07/17/2022 Histopathologic type: Adenocarcinoma, NOS Stage prefix: Initial diagnosis Prostate specific antigen (PSA) range: 10 to  19 Gleason primary pattern: 4 Gleason secondary pattern: 3 Gleason score: 7 Histologic grading system: 5 grade system Number of biopsy cores examined: 12 Number of biopsy cores positive: 4 Location of positive needle core biopsies: Both sides   07/17/2022 Initial Diagnosis   Malignant neoplasm of prostate (HCC)   Malignant neoplasm of tonsillar fossa (HCC)  06/08/2023 Initial Diagnosis   Malignant neoplasm of tonsillar fossa (HCC)   06/08/2023 Cancer Staging   Staging form: Pharynx - HPV-Mediated Oropharynx, AJCC 8th Edition - Clinical stage from 06/08/2023: Stage II (cT3, cN2, cM0, p16+) - Signed by Izell Domino, MD on 06/08/2023 Stage prefix: Initial diagnosis   07/09/2023 - 09/09/2023 Chemotherapy   Patient is on Treatment Plan : HEAD/NECK Cisplatin  (40) q7d         REVIEW OF SYSTEMS:   Review of Systems - Oncology  All other pertinent systems were reviewed with the patient and are negative.  ALLERGIES: He is allergic to morphine , other, and testosterone.  MEDICATIONS:  Current Outpatient Medications  Medication Sig Dispense Refill   acetaminophen  (TYLENOL ) 325 MG tablet Take 2 tablets (650 mg total) by mouth every 6 (six) hours as needed for mild pain (pain score 1-3) or fever (or Fever >/= 101).     ALPRAZolam  (XANAX ) 1 MG tablet TAKE ONE-HALF TABLET BY MOUTH TWO TIMES DAILY AS NEEDED FOR ANXIETY 60 tablet 0   aspirin  81 MG chewable tablet Chew 1 tablet (81 mg total) by mouth daily.     atorvastatin  (LIPITOR) 20 MG tablet Take 20 mg by mouth daily.     doxazosin  (CARDURA )  1 MG tablet Take one tablet by MOUTH AT bedtime. 30 tablet 1   feeding supplement (ENSURE PLUS HIGH PROTEIN) LIQD Take 237 mLs by mouth 2 (two) times daily between meals.     levothyroxine  (SYNTHROID ) 88 MCG tablet TAKE ONE TABLET BY MOUTH DAILY. 90 tablet 2   ondansetron  (ZOFRAN ) 4 MG tablet Take 1 tablet (4 mg total) by mouth every 6 (six) hours as needed for nausea. 20 tablet 0   oxyCODONE  (OXY  IR/ROXICODONE ) 5 MG immediate release tablet Take 0.5-1 tablets (2.5-5 mg total) by mouth every 4 (four) hours as needed for severe pain (pain score 7-10) or moderate pain (pain score 4-6). 15 tablet 0   pantoprazole  (PROTONIX ) 40 MG tablet Take 40 mg by mouth daily.     solifenacin (VESICARE) 5 MG tablet Take 5 mg by mouth daily.     No current facility-administered medications for this visit.     VITALS:   Blood pressure 127/66, pulse 73, temperature (!) 97.3 F (36.3 C), temperature source Temporal, resp. rate 17, height 6' 1 (1.854 m), weight 244 lb (110.7 kg), SpO2 100%.  Wt Readings from Last 3 Encounters:  03/29/24 244 lb (110.7 kg)  01/22/24 247 lb (112 kg)  01/21/24 246 lb (111.6 kg)    Body mass index is 32.19 kg/m.    Onc Performance Status - 03/29/24 1023       ECOG Perf Status   ECOG Perf Status Ambulatory and capable of all selfcare but unable to carry out any work activities.  Up and about more than 50% of waking hours      KPS SCALE   KPS % SCORE Able to carry on normal activity, minor s/s of disease            PHYSICAL EXAM:   Physical Exam Constitutional:      General: He is not in acute distress.    Comments: Looking a lot better clinically  HENT:     Head: Normocephalic and atraumatic.  Eyes:     General: No scleral icterus.    Conjunctiva/sclera: Conjunctivae normal.  Cardiovascular:     Rate and Rhythm: Normal rate and regular rhythm.     Heart sounds: Normal heart sounds.  Pulmonary:     Effort: Pulmonary effort is normal.     Breath sounds: Normal breath sounds.  Chest:     Comments: Right sided Port-A-Cath on place without any signs of infection Abdominal:     General: There is no distension.  Musculoskeletal:     Right lower leg: No edema.     Left lower leg: No edema.  Neurological:     General: No focal deficit present.     Mental Status: He is alert and oriented to person, place, and time.      LABORATORY DATA:   I have  reviewed the data as listed.  Results for orders placed or performed in visit on 03/29/24  Magnesium   Result Value Ref Range   Magnesium  2.0 1.7 - 2.4 mg/dL  CBC with Differential (Cancer Center Only)  Result Value Ref Range   WBC Count 2.7 (L) 4.0 - 10.5 K/uL   RBC 3.71 (L) 4.22 - 5.81 MIL/uL   Hemoglobin 11.6 (L) 13.0 - 17.0 g/dL   HCT 64.2 (L) 60.9 - 47.9 %   MCV 96.2 80.0 - 100.0 fL   MCH 31.3 26.0 - 34.0 pg   MCHC 32.5 30.0 - 36.0 g/dL   RDW 85.2 88.4 - 84.4 %  Platelet Count 123 (L) 150 - 400 K/uL   nRBC 0.0 0.0 - 0.2 %   Neutrophils Relative % 67 %   Neutro Abs 1.8 1.7 - 7.7 K/uL   Lymphocytes Relative 18 %   Lymphs Abs 0.5 (L) 0.7 - 4.0 K/uL   Monocytes Relative 12 %   Monocytes Absolute 0.3 0.1 - 1.0 K/uL   Eosinophils Relative 3 %   Eosinophils Absolute 0.1 0.0 - 0.5 K/uL   Basophils Relative 0 %   Basophils Absolute 0.0 0.0 - 0.1 K/uL   Immature Granulocytes 0 %   Abs Immature Granulocytes 0.00 0.00 - 0.07 K/uL  CMP (Cancer Center only)  Result Value Ref Range   Sodium 142 135 - 145 mmol/L   Potassium 3.8 3.5 - 5.1 mmol/L   Chloride 104 98 - 111 mmol/L   CO2 30 22 - 32 mmol/L   Glucose, Bld 139 (H) 70 - 99 mg/dL   BUN 22 8 - 23 mg/dL   Creatinine 9.45 (L) 9.38 - 1.24 mg/dL   Calcium  9.0 8.9 - 10.3 mg/dL   Total Protein 6.2 (L) 6.5 - 8.1 g/dL   Albumin 3.8 3.5 - 5.0 g/dL   AST 34 15 - 41 U/L   ALT 18 0 - 44 U/L   Alkaline Phosphatase 76 38 - 126 U/L   Total Bilirubin 0.5 0.0 - 1.2 mg/dL   GFR, Estimated >39 >39 mL/min   Anion gap 7 5 - 15    RADIOGRAPHIC STUDIES:  I have personally reviewed the radiological images as listed and agree with the findings in the report.  CT Soft Tissue Neck W Contrast Result Date: 03/24/2024 EXAM: CT NECK WITH CONTRAST 03/20/2024 08:40:37 AM TECHNIQUE: CT of the neck was performed with the administration of intravenous contrast. Multiplanar reformatted images are provided for review. Automated exposure control, iterative  reconstruction, and/or weight based adjustment of the mA/kV was utilized to reduce the radiation dose to as low as reasonably achievable. CONTRAST: 75 mL of Omnipaque  300. COMPARISON: CT Neck 05/17/2023. CLINICAL HISTORY: Restaging oropharyngeal cancer, malignant neoplasm of tonsillar fossa. FINDINGS: AERODIGESTIVE TRACT: Interval resolution of an ulcerative enhancing mass in the right oropharynx noted on the previous study. There is treatment-related distortion of the soft tissues, but no evidence of residual mass. No edema. SALIVARY GLANDS: The parotid and submandibular glands are unremarkable. THYROID : Unremarkable. LYMPH NODES: No suspicious cervical lymphadenopathy. SOFT TISSUES: A right internal jugular chest port has been placed in the interim. No mass or fluid collection. BRAIN, ORBITS, SINUSES AND MASTOIDS: Minimal polypoid mucosal disease within the floors of the maxillary sinuses. No acute abnormality. LUNGS AND MEDIASTINUM: No acute abnormality. BONES: Prominent osteophytes arising anteriorly from the lower cervical spine at C5, C6 and C7. No focal bone abnormality. IMPRESSION: 1. Interval resolution of the previously seen ulcerative enhancing right oropharyngeal mass with no evidence of residual mass; posttreatment soft tissue distortion noted 2. No cervical lymphadenopathy 3. Right internal jugular chest port placement since prior 4. Minimal polypoid mucosal disease in the maxillary sinus floors 5. Prominent anterior osteophytes at [facility] Electronically signed by: Evalene Coho MD 03/24/2024 12:06 PM EST RP Workstation: [id number]   CT Chest W Contrast Result Date: 03/24/2024 CLINICAL DATA:  Head and neck cancer. Restaging. * Tracking Code: BO * EXAM: CT CHEST WITH CONTRAST TECHNIQUE: Multidetector CT imaging of the chest was performed during intravenous contrast administration. RADIATION DOSE REDUCTION: This exam was performed according to the departmental dose-optimization program which includes  automated exposure control,  adjustment of the mA and/or kV according to patient size and/or use of iterative reconstruction technique. CONTRAST:  75mL OMNIPAQUE  IOHEXOL  300 MG/ML  SOLN COMPARISON:  PET-CT 11/16/2023 FINDINGS: Cardiovascular: The heart size is normal. No substantial pericardial effusion. Coronary artery calcification is evident. Mild atherosclerotic calcification is noted in the wall of the thoracic aorta. Right Port-A-Cath tip is positioned at the SVC/RA junction. Mediastinum/Nodes: No mediastinal lymphadenopathy. There is no hilar lymphadenopathy. The esophagus has normal imaging features. There is no axillary lymphadenopathy. Lungs/Pleura: No suspicious pulmonary nodule or mass. No focal airspace consolidation. No pleural effusion. Upper Abdomen: Visualized portion of the upper abdomen shows no acute findings. Musculoskeletal: No worrisome lytic or sclerotic osseous abnormality. IMPRESSION: 1. No evidence for metastatic disease in the chest. 2.  Aortic Atherosclerosis (ICD10-I70.0). Electronically Signed   By: Camellia Candle M.D.   On: 03/24/2024 08:25     CODE STATUS:  Code Status History     Date Active Date Inactive Code Status Order ID Comments User Context   06/18/2023 1400 06/19/2023 0508 Full Code [id number]  Philip Cornet, MD H[facility]    Questions for Most Recent Historical Code Status (Order [id number])     Question Answer   By: Consent: discussion documented in EHR            Orders Placed This Encounter  Procedures   CBC with Differential (Cancer Center Only)    Standing Status:   Future    Expiration Date:   03/29/2025   CMP (Cancer Center only)    Standing Status:   Future    Expiration Date:   03/29/2025   TSH    Standing Status:   Future    Expiration Date:   03/29/2025     Future Appointments  Date Time Provider Department Center  04/11/2024  8:30 AM Anice Riis, DO [facility] None  04/11/2024 10:15 AM Jacelyn Lupita NOVAK, CCC-SLP [facility]  07/04/2024   2:20 PM Izell Domino, MD [facility] None     This document was completed utilizing speech recognition software. Grammatical errors, random word insertions, pronoun errors, and incomplete sentences are an occasional consequence of this system due to software limitations, ambient noise, and hardware issues. Any formal questions or concerns about the content, text or information contained within the body of this dictation should be directly addressed to the provider for clarification.   "

## 2024-03-29 NOTE — Progress Notes (Signed)
 Nutrition Follow-up:  Pt with stage II SCC of right tonsil, p16 positive. He completed concurrent chemoradiation (first chemo 3/21; final RT 5/16). Patient is under the care of Dr. Izell and Dr. Autumn.    10/4-10/8 - admission with acute cholecystitis s/p cholecystectomy 10/6  10/18 - Gtube out   Received secure chat from Dr. Autumn. Reports clinic running behind and MD f/u completed one hour after scheduled RD appointment. Nutrition follow-up completed with patient and wife via telephone. Patient relying on Ensure Complete for nutrition. Patient can taste chocolate/strawberry/vanilla. Does not taste as good as it did prior to treatment, but they are tolerable. He is unable to eat solids as everything taste nasty. Patient would be fine if food had no taste, however all solids have the same nasty taste. He has not followed up with dentist as previously suggested. Wife reports poor oral care at home. Despite this, patient reports feeling as good as he ever has. Has energy like he did 30 years ago. Having regular bowel movements with daily senna-s. Denies nausea or vomiting.    Medications: reviewed   Labs: glucose 139, Cr 0.54  Anthropometrics: Wt 244 lb today   10/1 - 246 lb 7.6 oz   NUTRITION DIAGNOSIS: Unintended wt loss stable   INTERVENTION:  Continue drinking Ensure Plus/equivalent 5-6/day - will mail smoothie/shake recipes Recommend fiber supplement given reliance ONS  Reviewed importance of oral care. Patient with poor dentition at baseline. Recommend dental evaluation - wife to make an appointment    MONITORING, EVALUATION, GOAL: wt trends, intake   NEXT VISIT: Thursday January 29 via telephone

## 2024-03-29 NOTE — Assessment & Plan Note (Addendum)
 Please review oncology history for additional details and timeline of events.  Symptoms include severe pain and dysphagia since mid-November, progressively worsening.   cT3,cN2,cM0,p16+ tumor, Stage II disease.   His case was discussed in tumor conference on 06/09/2023.  Given the extent of disease, consensus opinion is to proceed with concurrent chemoradiation. We have discussed about role of cisplatin  being a radiosensitizer in the treatment of head and neck cancer.  We have discussed about the curative intent of chemoradiation for this patient.  Patient was willing to proceed with weekly cisplatin  after discussing risk versus benefits and side effect profile.  He started cycle 1 of cisplatin  at a dose of 40 mg/m on 07/09/2023.  Plan was to continue cisplatin  weekly during the course of radiation. He began radiation treatments from 07/12/2023.  During week 2, chemotherapy had to be held because of severe fatigue with ECOG performance status of 3.  He received total of 4 cycles of Cisplatin . Last cisplatin  was on 08/06/23. Stopped early because of side effects including neutropenia/ leukopenia, fatigue, nausea, weight loss.   Restaging PET scan on 11/16/2023 showed overall improvement in disease without evidence of disease progression.    On 03/20/2024, restaging CT soft tissue of the neck showed interval resolution of the previously seen ulcerative enhancing right oropharyngeal mass with no evidence of residual mass; posttreatment soft tissue distortion noted. No cervical lymphadenopathy.  CT chest showed no evidence of metastatic disease.  He remains in remission.  Will continue surveillance as per NCCN guidelines.  He has follow-up with ENT Dr. Kermit on 04/11/2024.  We referred him for Port-A-Cath removal, since there is no evidence of disease currently.

## 2024-04-06 ENCOUNTER — Ambulatory Visit (INDEPENDENT_AMBULATORY_CARE_PROVIDER_SITE_OTHER): Admitting: Otolaryngology

## 2024-04-09 ENCOUNTER — Other Ambulatory Visit: Payer: Self-pay | Admitting: Family Medicine

## 2024-04-09 DIAGNOSIS — E890 Postprocedural hypothyroidism: Secondary | ICD-10-CM

## 2024-04-09 DIAGNOSIS — I10 Essential (primary) hypertension: Secondary | ICD-10-CM

## 2024-04-11 ENCOUNTER — Encounter (INDEPENDENT_AMBULATORY_CARE_PROVIDER_SITE_OTHER): Payer: Self-pay

## 2024-04-11 ENCOUNTER — Ambulatory Visit (INDEPENDENT_AMBULATORY_CARE_PROVIDER_SITE_OTHER)

## 2024-04-11 ENCOUNTER — Ambulatory Visit

## 2024-04-11 VITALS — BP 105/69 | HR 73

## 2024-04-11 DIAGNOSIS — T451X5A Adverse effect of antineoplastic and immunosuppressive drugs, initial encounter: Secondary | ICD-10-CM

## 2024-04-11 DIAGNOSIS — C099 Malignant neoplasm of tonsil, unspecified: Secondary | ICD-10-CM | POA: Diagnosis not present

## 2024-04-11 DIAGNOSIS — R432 Parageusia: Secondary | ICD-10-CM | POA: Diagnosis not present

## 2024-04-11 DIAGNOSIS — R1319 Other dysphagia: Secondary | ICD-10-CM

## 2024-04-11 DIAGNOSIS — K117 Disturbances of salivary secretion: Secondary | ICD-10-CM | POA: Diagnosis not present

## 2024-04-11 DIAGNOSIS — C109 Malignant neoplasm of oropharynx, unspecified: Secondary | ICD-10-CM

## 2024-04-11 DIAGNOSIS — Y842 Radiological procedure and radiotherapy as the cause of abnormal reaction of the patient, or of later complication, without mention of misadventure at the time of the procedure: Secondary | ICD-10-CM

## 2024-04-11 NOTE — Progress Notes (Signed)
 Discussed the use of AI scribe software for clinical note transcription with the patient, who gave verbal consent to proceed.  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  Initial visit with me.   Dysphagia and nutritional status - Unable to tolerate solid foods since initiation of chemoradiation. - Relies exclusively on oral nutritional supplements for caloric intake. - Able to drink fluids without difficulty. - Previously had a PEG tube, which fell out in October 2025; not currently using a PEG tube. - Weight decreased from 305 lbs prior to treatment to 234 lbs; weight has remained stable in recent months.  Dysgeusia - Profound dysgeusia with all foods tasting unpleasant regardless of type. - Unable to tolerate chewing or swallowing solids due to taste disturbance. - Able to distinguish flavors in shakes but finds all other foods intolerable when chewed. - Able to smell food and finds aroma appealing, but taste upon chewing is intolerable. - Strong desire to eat but unable to do so due to taste disturbance.  Xerostomia and oral health - Persistent xerostomia since chemoradiation, requiring frequent sips of water . - History of oral candidiasis, now resolved. - Uses oral rinses for oral care. - Scheduled for routine dental follow-up.  Right-sided hearing loss - Right-sided hearing loss attributed to a viral etiology, onset three to four years ago after travel. - Audiometry at that time confirmed right-sided hearing loss. - No hearing in the right ear. - No current otalgia.  Cancer treatment history - Completed four cycles of chemotherapy, with two missed cycles due to neutropenia and hospitalization for fever. - Completed thirty-five sessions of  radiation for right tonsillar squamous cell carcinoma.   Physical Exam MEASUREMENTS: Weight- 234. HEENT: External ears normal. Oral cavity without thrush, with a spot on the mandible - midline lower gingiva recession. Oropharynx clear NECK: Neck normal.  Assessment & Plan Right tonsillar squamous cell carcinoma, post-chemoradiation  cT3,cN2,cM0,p16+ tumor, Stage II disease.  4 treatments of cisplatin , 08/06/23. Did not receive last 2 2/2 side effects Final RT, 09/03/2023  No evidence of recurrence on recent imaging or examination reviewed and interpreted by me. Approaching one-year post-treatment. Planned surveillance with oropharyngeal examination every two months. Instructed him to report weight fluctuations greater than 10-15 pounds.  Chemoradiation-induced dysphagia and dysgeusia Persistent dysphagia and severe dysgeusia post-chemoradiation. Inability to tolerate solid foods, dependent on nutritional supplements. Some ability to distinguish basic flavors. Taste disturbance due to radiation field proximity to taste buds; ongoing neural recovery possible. - Encouraged experimentation with different foods and flavors to stimulate taste recovery. - Recommended regular oral intake attempts, including varied foods and seasonings, to promote neural recovery. - Advised continuation of nutritional supplements (Ensure, protein shakes) to maintain caloric intake. - Supported resumption of speech therapy for dysphagia management. - All PO intake, PEG Remvoed.  Chemoradiation-induced xerostomia Significant xerostomia as a chronic effect of chemoradiation, increasing risk for dental complications and oral discomfort. Oral candidiasis resolved. - Advised frequent sips of water  to manage xerostomia. - Recommended use of oral rinses as tolerated. - Emphasized regular dental care and oral hygiene to prevent dental complications. - Reviewed and confirmed timing of upcoming dental  appointment.   Follow up with me in 2 months. Likely flexible laryngoscopy

## 2024-04-13 ENCOUNTER — Encounter: Payer: Self-pay | Admitting: Oncology

## 2024-04-13 ENCOUNTER — Encounter: Payer: Self-pay | Admitting: Radiation Oncology

## 2024-04-14 ENCOUNTER — Ambulatory Visit (HOSPITAL_COMMUNITY)
Admission: RE | Admit: 2024-04-14 | Discharge: 2024-04-14 | Disposition: A | Discharge status: Home / Self Care | Source: Ambulatory Visit | Attending: Oncology | Admitting: Oncology

## 2024-04-14 DIAGNOSIS — C090 Malignant neoplasm of tonsillar fossa: Secondary | ICD-10-CM | POA: Diagnosis present

## 2024-04-14 HISTORY — PX: IR REMOVAL TUN ACCESS W/ PORT W/O FL MOD SED: IMG2290

## 2024-04-14 MED ORDER — LIDOCAINE HCL 1 % IJ SOLN
INTRAMUSCULAR | Status: AC
  Filled 2024-04-14: qty 20

## 2024-04-14 MED ORDER — LIDOCAINE HCL 1 % IJ SOLN
20.0000 mL | Freq: Once | INTRAMUSCULAR | Status: AC
  Administered 2024-04-14: 10 mL via INTRADERMAL

## 2024-04-14 NOTE — Procedures (Signed)
 Interventional Radiology Procedure Note  Procedure: Chest Port removal  Complications: None  Estimated Blood Loss: < 10 mL  Findings: Right jugular vein port removal  Isaac Carpenter Banner, MD

## 2024-04-16 ENCOUNTER — Other Ambulatory Visit: Payer: Self-pay | Admitting: Oncology

## 2024-04-16 DIAGNOSIS — N401 Enlarged prostate with lower urinary tract symptoms: Secondary | ICD-10-CM

## 2024-04-26 ENCOUNTER — Encounter: Payer: Self-pay | Admitting: Radiation Oncology

## 2024-04-26 ENCOUNTER — Other Ambulatory Visit: Payer: Self-pay | Admitting: Oncology

## 2024-04-26 DIAGNOSIS — F418 Other specified anxiety disorders: Secondary | ICD-10-CM

## 2024-05-18 ENCOUNTER — Telehealth: Payer: Self-pay | Admitting: Dietician

## 2024-05-18 ENCOUNTER — Inpatient Hospital Stay: Attending: Nurse Practitioner | Admitting: Dietician

## 2024-05-18 NOTE — Telephone Encounter (Signed)
 Nutrition Follow-up:  Pt with stage II SCC of right tonsil, p16 positive. He completed concurrent chemoradiation (first chemo 3/21; final RT 5/16). Patient is under the care of Dr. Izell and Dr. Autumn.    10/4-10/8 - admission with acute cholecystitis s/p cholecystectomy 10/6   10/18 - Gtube out   Spoke with wife via telephone for nutrition follow-up. Patient has established care with a dentist. Underwent deep cleaning to one side of mouth on 1/6 and completed other half this morning per wife. Notes after first cleaning, he has been able to taste a few foods (chicken noodle soup, meatloaf, salad). Patient had xylitol hard candy provided by dentist which was helpful in keeping mouth moist. Wife has ordered the same brand to have at home. Patient trying to decrease daily Ensure as this leaves a coating on his teeth that contributes to bad taste. Dentist encouraged discontinuing per wife. She has incorporated [place] milk with protein powder and nestle quick to replace some of the Ensure. Patient continues to drink 3-4 Ensure at this time as she is having a hard time getting in adequate calories which has resulted in additional weight loss. Wife reports patient feels good. Reports significant improvement to energy and activity.   Medications: reviewed   Labs: no new labs   Anthropometrics: Wife reports weights have decreased. Reports 132-135 lb the last few weeks  12/10 - 244 lb    NUTRITION DIAGNOSIS: Unintended wt loss continues    INTERVENTION:  Altered taste improving s/p deep cleaning at dentist and implementing good oral care at home Continue working to decrease intake of Ensure  Suggested adding canned chicken to soup, choosing high calorie dressings for salads, encourage high calorie snacks  Continue drinking fortified milk, recommend 2-3 glasses/day Consider appetite stimulant if appetite does not improve with d/c of Ensure  MONITORING, EVALUATION, GOAL: wt trends,  intake   NEXT VISIT: Thursday February 26 via telephone

## 2024-05-22 ENCOUNTER — Ambulatory Visit: Payer: Self-pay

## 2024-05-22 NOTE — Progress Notes (Unsigned)
" ° °  05/22/2024  Patient ID: Isaac Carpenter, male   DOB: 15-Mar-1951, 74 y.o.   MRN: [id number]  Pharmacy Quality Measure Review  This patient is appearing on a report for being at risk of failing the adherence measure for cholesterol (statin) medications this calendar year.   Medication: Atorvastatin  Last fill date: 04/03/24 for 90 day supply  Insurance report was not up to date. No action needed at this time.   Jon VEAR Lindau, PharmD Clinical Pharmacist [phone]   "

## 2024-05-22 NOTE — Telephone Encounter (Signed)
" °  FYI Only or Action Required?: FYI only for provider: appointment scheduled on 2/3.  Patient was last seen in primary care on 05/17/2023 by Jordan, [name], MD.  Called Nurse Triage reporting Arm Pain.  Symptoms began a week ago.  Interventions attempted: OTC medications: aleve.  Symptoms are: stable.  Triage Disposition: See PCP When Office is Open (Within 3 Days)  Patient/caregiver understands and will follow disposition?: Yes  Reason for Triage: Left arm pain going on for a few days.    Reason for Disposition  [1] MODERATE pain (e.g., interferes with normal activities) AND [2] present > 3 days  Answer Assessment - Initial Assessment Questions 1. ONSET: When did the pain start?     Not quite a week per patient  2. LOCATION: Where is the pain located?     Left forearm pain (Denies CP or SOB). Denies radiation.   3. PAIN: How bad is the pain? (Scale 0-10; or none, mild, moderate, severe)     Mostly at night, throbs like a toothache. Wife of pt reports he sleeps on his side    5. CAUSE: What do you think is causing the arm pain?     Possibly his sleeping positions  6. OTHER SYMPTOMS: Do you have any other symptoms? (e.g., neck pain, swelling, rash, fever, numbness, weakness)     Denies  Protocols used: Arm Pain-A-AH  "

## 2024-05-23 ENCOUNTER — Ambulatory Visit: Admitting: Family Medicine

## 2024-05-23 ENCOUNTER — Encounter: Payer: Self-pay | Admitting: Family Medicine

## 2024-05-23 ENCOUNTER — Ambulatory Visit: Payer: Self-pay | Admitting: Family Medicine

## 2024-05-23 VITALS — BP 122/62 | HR 66 | Temp 97.7°F | Resp 16 | Ht 73.0 in | Wt 234.0 lb

## 2024-05-23 DIAGNOSIS — R432 Parageusia: Secondary | ICD-10-CM | POA: Diagnosis not present

## 2024-05-23 DIAGNOSIS — E1169 Type 2 diabetes mellitus with other specified complication: Secondary | ICD-10-CM | POA: Diagnosis not present

## 2024-05-23 DIAGNOSIS — Z85818 Personal history of malignant neoplasm of other sites of lip, oral cavity, and pharynx: Secondary | ICD-10-CM | POA: Diagnosis not present

## 2024-05-23 DIAGNOSIS — M25522 Pain in left elbow: Secondary | ICD-10-CM | POA: Diagnosis not present

## 2024-05-23 LAB — POCT GLYCOSYLATED HEMOGLOBIN (HGB A1C): Hemoglobin A1C: 5.5 % (ref 4.0–5.6)

## 2024-05-23 LAB — MICROALBUMIN / CREATININE URINE RATIO
Creatinine,U: 209 mg/dL
Microalb Creat Ratio: 13.1 mg/g (ref 0.0–30.0)
Microalb, Ur: 2.7 mg/dL — ABNORMAL HIGH (ref 0.7–1.9)

## 2024-05-23 MED ORDER — CELECOXIB 100 MG PO CAPS: ORAL_CAPSULE | ORAL | 1 refills | Status: AC

## 2024-05-23 NOTE — Assessment & Plan Note (Addendum)
 With associated OSA,periodontal disease, and HLD. HgA1C is at goal, it went from 6.5 to 5.5 today. Continue non pharmacologic treatment. Annual eye exam, periodic dental and foot care recommended. F/U in 5-6 months.

## 2024-05-23 NOTE — Patient Instructions (Addendum)
 A few things to remember from today's visit:  Type 2 diabetes mellitus with other specified complication, without long-term current use of insulin  (HCC) - Plan: POC HgB A1c, Microalbumin / creatinine urine ratio  Left elbow pain - Plan: celecoxib  (CELEBREX ) 100 MG capsule  Topical icy hot at night may help. Celebrex  2 times daily for 7-10 days then as needed at bedtime.  If you need refills for medications you take chronically, please call your pharmacy. Do not use My Chart to request refills or for acute issues that need immediate attention. If you send a my chart message, it may take a few days to be addressed, specially if I am not in the office.  Please be sure medication list is accurate. If a new problem present, please set up appointment sooner than planned today.

## 2024-05-24 ENCOUNTER — Telehealth: Payer: Self-pay | Admitting: Family Medicine

## 2024-05-24 DIAGNOSIS — C61 Malignant neoplasm of prostate: Secondary | ICD-10-CM

## 2024-05-24 NOTE — Telephone Encounter (Signed)
 Copied from CRM [id number]. Topic: Referral - Question >> May 24, 2024 11:17 AM Carlyon D wrote: Reason for CRM: Pt wife is calling in regards to urology office telling her pt needs a referral prior to his next apt. 2/12 at 2:45. Miss [name] is asking if a referral can be completed for pt so he can keep his apt. If it is needed.  Dr. Lovie  [facility]  Please reach out to pt wife laura if any further questions.

## 2024-06-14 ENCOUNTER — Ambulatory Visit (INDEPENDENT_AMBULATORY_CARE_PROVIDER_SITE_OTHER)

## 2024-06-15 ENCOUNTER — Inpatient Hospital Stay: Attending: Nurse Practitioner | Admitting: Dietician

## 2024-07-04 ENCOUNTER — Ambulatory Visit: Payer: Self-pay | Admitting: Radiation Oncology

## 2024-07-04 ENCOUNTER — Ambulatory Visit: Admitting: Radiation Oncology

## 2024-08-31 ENCOUNTER — Inpatient Hospital Stay: Attending: Nurse Practitioner

## 2024-08-31 ENCOUNTER — Inpatient Hospital Stay: Admitting: Oncology
